# Patient Record
Sex: Male | Born: 1935 | Race: Black or African American | Hispanic: No | Marital: Single | Smoking: Current every day smoker
Health system: Southern US, Community
[De-identification: ages and names within clinical notes are randomized; demographics above are authoritative.]

## PROBLEM LIST (undated history)

## (undated) DIAGNOSIS — I639 Cerebral infarction, unspecified: Secondary | ICD-10-CM

## (undated) DIAGNOSIS — I10 Essential (primary) hypertension: Secondary | ICD-10-CM

## (undated) DIAGNOSIS — M109 Gout, unspecified: Secondary | ICD-10-CM

## (undated) DIAGNOSIS — M1990 Unspecified osteoarthritis, unspecified site: Secondary | ICD-10-CM

## (undated) HISTORY — DX: Unspecified osteoarthritis, unspecified site: M19.90

## (undated) HISTORY — DX: Gout, unspecified: M10.9

---

## 2004-01-27 ENCOUNTER — Other Ambulatory Visit: Payer: Self-pay

## 2004-11-19 ENCOUNTER — Emergency Department: Payer: Self-pay | Admitting: Emergency Medicine

## 2005-10-17 ENCOUNTER — Ambulatory Visit: Payer: Self-pay | Admitting: *Deleted

## 2008-01-18 ENCOUNTER — Emergency Department: Payer: Self-pay | Admitting: Emergency Medicine

## 2008-01-26 ENCOUNTER — Emergency Department: Payer: Self-pay | Admitting: Emergency Medicine

## 2009-11-24 ENCOUNTER — Inpatient Hospital Stay (HOSPITAL_COMMUNITY): Admission: EM | Admit: 2009-11-24 | Discharge: 2009-11-26 | Payer: Self-pay | Admitting: Emergency Medicine

## 2010-09-23 LAB — CBC
HCT: 31.2 % — ABNORMAL LOW (ref 39.0–52.0)
HCT: 31.8 % — ABNORMAL LOW (ref 39.0–52.0)
Hemoglobin: 10.9 g/dL — ABNORMAL LOW (ref 13.0–17.0)
MCHC: 34.7 g/dL (ref 30.0–36.0)
MCHC: 35 g/dL (ref 30.0–36.0)
MCV: 90.1 fL (ref 78.0–100.0)
Platelets: 220 10*3/uL (ref 150–400)
RBC: 3.6 MIL/uL — ABNORMAL LOW (ref 4.22–5.81)
RDW: 15.3 % (ref 11.5–15.5)
WBC: 6 10*3/uL (ref 4.0–10.5)
WBC: 8 10*3/uL (ref 4.0–10.5)

## 2010-09-23 LAB — COMPREHENSIVE METABOLIC PANEL
ALT: 25 U/L (ref 0–53)
ALT: 31 U/L (ref 0–53)
AST: 104 U/L — ABNORMAL HIGH (ref 0–37)
AST: 55 U/L — ABNORMAL HIGH (ref 0–37)
Albumin: 3.2 g/dL — ABNORMAL LOW (ref 3.5–5.2)
Albumin: 3.3 g/dL — ABNORMAL LOW (ref 3.5–5.2)
Albumin: 4.1 g/dL (ref 3.5–5.2)
Alkaline Phosphatase: 62 U/L (ref 39–117)
BUN: 14 mg/dL (ref 6–23)
BUN: 24 mg/dL — ABNORMAL HIGH (ref 6–23)
Calcium: 8.7 mg/dL (ref 8.4–10.5)
Calcium: 8.8 mg/dL (ref 8.4–10.5)
Calcium: 9.8 mg/dL (ref 8.4–10.5)
GFR calc Af Amer: 56 mL/min — ABNORMAL LOW (ref >60)
GFR calc non Af Amer: 46 mL/min — ABNORMAL LOW (ref >60)
GFR calc non Af Amer: 52 mL/min — ABNORMAL LOW (ref >60)
Glucose, Bld: 107 mg/dL — ABNORMAL HIGH (ref 70–99)
Glucose, Bld: 113 mg/dL — ABNORMAL HIGH (ref 70–99)
Potassium: 4.3 mEq/L (ref 3.5–5.1)
Sodium: 133 mEq/L — ABNORMAL LOW (ref 135–145)
Total Bilirubin: 0.3 mg/dL (ref 0.3–1.2)
Total Protein: 6.3 g/dL (ref 6.0–8.3)

## 2010-09-23 LAB — RETICULOCYTES
RBC.: 3.51 MIL/uL — ABNORMAL LOW (ref 4.22–5.81)
Retic Ct Pct: 0.3 % — ABNORMAL LOW (ref 0.4–3.1)

## 2010-09-23 LAB — DIFFERENTIAL
Basophils Absolute: 0 10*3/uL (ref 0.0–0.1)
Basophils Relative: 0 % (ref 0–1)
Basophils Relative: 0 % (ref 0–1)
Eosinophils Absolute: 0 10*3/uL (ref 0.0–0.7)
Eosinophils Absolute: 0.1 10*3/uL (ref 0.0–0.7)
Eosinophils Relative: 1 % (ref 0–5)
Lymphocytes Relative: 29 % (ref 12–46)
Lymphocytes Relative: 41 % (ref 12–46)
Lymphs Abs: 0.8 10*3/uL (ref 0.7–4.0)
Lymphs Abs: 1.9 10*3/uL (ref 0.7–4.0)
Monocytes Relative: 1 % — ABNORMAL LOW (ref 3–12)
Monocytes Relative: 9 % (ref 3–12)
Neutro Abs: 2 10*3/uL (ref 1.7–7.7)
Neutro Abs: 3.6 10*3/uL (ref 1.7–7.7)
Neutrophils Relative %: 44 % (ref 43–77)
Neutrophils Relative %: 60 % (ref 43–77)
Neutrophils Relative %: 88 % — ABNORMAL HIGH (ref 43–77)

## 2010-09-23 LAB — URINALYSIS, ROUTINE W REFLEX MICROSCOPIC
Bilirubin Urine: NEGATIVE
Glucose, UA: 100 mg/dL — AB
Leukocytes, UA: NEGATIVE
Nitrite: NEGATIVE
Specific Gravity, Urine: >1.03 — ABNORMAL HIGH (ref 1.005–1.030)
Urobilinogen, UA: 1 mg/dL (ref 0.0–1.0)
pH: 5 (ref 5.0–8.0)

## 2010-09-23 LAB — CORTISOL-AM, BLOOD: Cortisol - AM: 8.1 ug/dL (ref 4.3–22.4)

## 2010-09-23 LAB — RAPID URINE DRUG SCREEN, HOSP PERFORMED
Barbiturates: NOT DETECTED
Benzodiazepines: NOT DETECTED
Cocaine: NOT DETECTED

## 2010-09-23 LAB — GLUCOSE, CAPILLARY
Glucose-Capillary: 108 mg/dL — ABNORMAL HIGH (ref 70–99)
Glucose-Capillary: 114 mg/dL — ABNORMAL HIGH (ref 70–99)
Glucose-Capillary: 115 mg/dL — ABNORMAL HIGH (ref 70–99)
Glucose-Capillary: 122 mg/dL — ABNORMAL HIGH (ref 70–99)
Glucose-Capillary: 226 mg/dL — ABNORMAL HIGH (ref 70–99)

## 2010-09-23 LAB — VITAMIN B12: Vitamin B-12: 274 pg/mL (ref 211–911)

## 2010-09-23 LAB — LACTIC ACID, PLASMA
Lactic Acid, Venous: 1.3 mmol/L (ref 0.5–2.2)
Lactic Acid, Venous: 5.8 mmol/L — ABNORMAL HIGH (ref 0.5–2.2)

## 2010-09-23 LAB — CARDIAC PANEL(CRET KIN+CKTOT+MB+TROPI)
CK, MB: 7.4 ng/mL (ref 0.3–4.0)
CK, MB: 8.8 ng/mL (ref 0.3–4.0)
CK, MB: 9.5 ng/mL (ref 0.3–4.0)
Relative Index: 2.8 — ABNORMAL HIGH (ref 0.0–2.5)
Relative Index: 3 — ABNORMAL HIGH (ref 0.0–2.5)
Relative Index: 3.7 — ABNORMAL HIGH (ref 0.0–2.5)
Troponin I: 0.32 ng/mL — ABNORMAL HIGH (ref 0.00–0.06)

## 2010-09-23 LAB — CULTURE, BLOOD (ROUTINE X 2)
Culture: NO GROWTH
Report Status: 5262011

## 2010-09-23 LAB — FERRITIN: Ferritin: 146 ng/mL (ref 22–322)

## 2010-09-23 LAB — URINE CULTURE

## 2010-09-23 LAB — T4, FREE: Free T4: 1.06 ng/dL (ref 0.80–1.80)

## 2010-09-23 LAB — TSH: TSH: 0.577 u[IU]/mL (ref 0.350–4.500)

## 2010-09-23 LAB — MAGNESIUM
Magnesium: 1.8 mg/dL (ref 1.5–2.5)
Magnesium: 1.8 mg/dL (ref 1.5–2.5)

## 2010-09-23 LAB — URINE MICROSCOPIC-ADD ON

## 2010-09-23 LAB — IRON AND TIBC
TIBC: 237 ug/dL (ref 215–435)
UIBC: 126 ug/dL

## 2014-01-03 ENCOUNTER — Inpatient Hospital Stay: Payer: Self-pay | Admitting: Internal Medicine

## 2014-01-03 LAB — CBC WITH DIFFERENTIAL/PLATELET
BASOS ABS: 0.1 10*3/uL (ref 0.0–0.1)
BASOS PCT: 1.2 %
EOS ABS: 0.4 10*3/uL (ref 0.0–0.7)
Eosinophil %: 6.1 %
HCT: 45.1 % (ref 40.0–52.0)
HGB: 14.8 g/dL (ref 13.0–18.0)
LYMPHS PCT: 42.4 %
Lymphocyte #: 2.5 10*3/uL (ref 1.0–3.6)
MCH: 28.6 pg (ref 26.0–34.0)
MCHC: 32.8 g/dL (ref 32.0–36.0)
MCV: 87 fL (ref 80–100)
MONOS PCT: 12.6 %
Monocyte #: 0.7 x10 3/mm (ref 0.2–1.0)
NEUTROS ABS: 2.2 10*3/uL (ref 1.4–6.5)
Neutrophil %: 37.7 %
Platelet: 200 10*3/uL (ref 150–440)
RBC: 5.18 10*6/uL (ref 4.40–5.90)
RDW: 15.4 % — ABNORMAL HIGH (ref 11.5–14.5)
WBC: 5.9 10*3/uL (ref 3.8–10.6)

## 2014-01-03 LAB — URINALYSIS, COMPLETE
Bilirubin,UR: NEGATIVE
Blood: NEGATIVE
Glucose,UR: NEGATIVE mg/dL (ref 0–75)
Ketone: NEGATIVE
Leukocyte Esterase: NEGATIVE
Nitrite: NEGATIVE
PH: 5 (ref 4.5–8.0)
Protein: NEGATIVE
SPECIFIC GRAVITY: 1.008 (ref 1.003–1.030)
Squamous Epithelial: NONE SEEN

## 2014-01-03 LAB — BASIC METABOLIC PANEL
ANION GAP: 9 (ref 7–16)
BUN: 30 mg/dL — ABNORMAL HIGH (ref 7–18)
Calcium, Total: 9.8 mg/dL (ref 8.5–10.1)
Chloride: 98 mmol/L (ref 98–107)
Co2: 28 mmol/L (ref 21–32)
Creatinine: 1.2 mg/dL (ref 0.60–1.30)
GFR CALC NON AF AMER: 58 — AB
GLUCOSE: 84 mg/dL (ref 65–99)
OSMOLALITY: 275 (ref 275–301)
Potassium: 4.5 mmol/L (ref 3.5–5.1)
Sodium: 135 mmol/L — ABNORMAL LOW (ref 136–145)

## 2014-01-03 LAB — MAGNESIUM: Magnesium: 2.1 mg/dL

## 2014-01-03 LAB — HEPATIC FUNCTION PANEL A (ARMC)
ALK PHOS: 93 U/L
ALT: 10 U/L — AB (ref 12–78)
Albumin: 4.1 g/dL (ref 3.4–5.0)
Bilirubin, Direct: <0.1 mg/dL (ref 0.00–0.20)
Bilirubin,Total: 0.8 mg/dL (ref 0.2–1.0)
SGOT(AST): 25 U/L (ref 15–37)
Total Protein: 9.2 g/dL — ABNORMAL HIGH (ref 6.4–8.2)

## 2014-01-03 LAB — TROPONIN I

## 2014-01-03 LAB — LIPASE, BLOOD: Lipase: 90 U/L (ref 73–393)

## 2014-01-04 LAB — TSH: THYROID STIMULATING HORM: 0.288 u[IU]/mL — AB

## 2014-01-04 LAB — TROPONIN I

## 2014-01-04 LAB — LIPID PANEL
Cholesterol: 166 mg/dL (ref 0–200)
HDL: 30 mg/dL — AB (ref 40–60)
LDL CHOLESTEROL, CALC: 120 mg/dL — AB (ref 0–100)
Triglycerides: 79 mg/dL (ref 0–200)
VLDL Cholesterol, Calc: 16 mg/dL (ref 5–40)

## 2014-01-04 LAB — CK TOTAL AND CKMB (NOT AT ARMC)
CK, TOTAL: 138 U/L
CK, TOTAL: 143 U/L
CK-MB: 1.8 ng/mL (ref 0.5–3.6)
CK-MB: 1.8 ng/mL (ref 0.5–3.6)

## 2014-10-28 NOTE — H&P (Signed)
PATIENT NAME:  [name], [name] MR#:  [id number] DATE OF BIRTH:  04-Jun-1934  DATE OF ADMISSION:  01/03/2014  PRIMARY CARE PHYSICIAN:  Dr. Darreld Mclean.   CHIEF COMPLAINT:  Left-sided weakness, not feeling well.   HISTORY OF PRESENT ILLNESS:  [name] is a 81 year old male with known history of hypertension, hyperlipidemia who usually is wheelchair bound, had pain in the left arm about a week back.  Today, the patient called his daughter stating that the patient has had difficulty swallowing, could not strike the matches and weakness in the left leg.  Concerning this, the patient's daughter brought him to the Emergency Department.  At the time of my interview the patient's symptoms were resolved.  Work-up in the Emergency Department with a CT head without contrast:  No acute intracranial abnormality.  Chest x-ray did not show any acute abnormality.  The patient also experienced a headache, shortness of breath.   PAST MEDICAL HISTORY: 1.  Hypertension.  2.  Gout. 3.  Osteoarthritis.   PAST SURGICAL HISTORY:  None.   ALLERGIES:  No known drug allergies.   HOME MEDICATIONS:  None.   SOCIAL HISTORY:  Continues to smoke 1 pack a day.  Denies drinking alcohol or using illicit drugs.  Independent of ADLs.   FAMILY HISTORY:  Hypertension, diabetes mellitus, on the male side of the family.  One of the brother's with cancer.   REVIEW OF SYSTEMS: CONSTITUTIONAL:  Generalized weakness.  EYES:  No change in vision.  EARS, NOSE, THROAT:  No change in hearing.  RESPIRATORY:  No cough, shortness of breath.  CARDIOVASCULAR:  No chest pain, palpations.  GASTROINTESTINAL:  No nausea, vomiting, abdominal pain, however experienced difficulty swallowing.  SKIN:  No rash or lesions.  MUSCULOSKELETAL:  Good range of motion in all the extremities.  NEUROLOGIC:  Experienced weakness on the left side of the body.   PHYSICAL EXAMINATION: GENERAL:  This is a well-built, well-nourished, age-appropriate male  lying down in the bed, not in distress.  VITAL SIGNS:  Temperature 97.8, pulse 80, blood pressure 129/76, respiratory rate of 20, oxygen saturation is 94% on room air.  HEENT:  Head normocephalic, atraumatic.  Eyes, no scleral icterus.  Conjunctivae normal.  Pupils equal and react to light.  Extraocular movements are intact.  Mucous membranes moist.  No pharyngeal erythema.  NECK:  Supple.  No lymphadenopathy.  No JVD.  No carotid bruit.  CHEST:  Has no focal tenderness.  LUNGS:  Bilaterally clear to auscultation.  HEART:  S1, S2 regular.  No murmurs are heard.  ABDOMEN:  Bowel sounds plus.  Soft, nontender, nondistended.  EXTREMITIES:  No pedal edema.  Pulses 2+.  SKIN:  No rash or lesions.  MUSCULOSKELETAL:  Good range of motion in all the extremities.   NEUROLOGIC:  The patient is alert, oriented to place, person, and time.  Cranial nerves II through XII intact.  Motor 5 by 5 in upper and lower extremities.   LABORATORY DATA:  Magnesium 2.1.  CT head without contrast:  No acute intracranial abnormality.   CBC and CMP are completely within normal limits.   ASSESSMENT AND PLAN:  [name], a 81 year old male who comes with left arm pain, numbness, difficulty swallowing.  1.  Transient ischemic attack.  We will obtain MRI of the brain, carotid Dopplers and echocardiogram.  We will obtain physical therapy, occupational therapy.  2.  Hypertension:  Currently well controlled.  Continue with the home medications.  3.  Keep the patient on  deep vein thrombosis prophylaxis with Lovenox.   TIME SPENT:  45 minutes.    ____________________________ Susa GriffinsPadmaja Avianna Moynahan, MD pv:ea D: 01/04/2014 00:54:39 ET T: 01/04/2014 01:56:38 ET JOB#: [id number]  cc: Susa GriffinsPadmaja Ritesh Opara, MD, <Dictator> Susa GriffinsPADMAJA Shahad Mazurek MD ELECTRONICALLY SIGNED 01/12/2014 0:26

## 2014-10-28 NOTE — Discharge Summary (Signed)
PATIENT NAME:  [name], [name] MR#:  [id number] DATE OF BIRTH:  May 14, 1934  DATE OF ADMISSION:  01/03/2014 DATE OF DISCHARGE:  01/04/2014  Please note, the patient left against medical advice.   DISCHARGE DIAGNOSIS: Acute cerebrovascular accident.   SECONDARY DIAGNOSES: 1.  Hypertension.  2.  Gout.  3.  Osteoarthritis.    CONSULTATIONS: None.   PROCEDURES AND RADIOLOGY: Bilateral carotid Dopplers on the 1st of July showed moderate plaque at both carotid bifurcations. Bilateral ICA stenosis of less than 50%. MRI of the brain without contrast on 1st of July showed several small foci of acute infarction within the right hemispheric white matter, the largest in the frontal lobe measuring 5 mm. No large acute infarction. Chronic small vessel ischemic changes throughout the brain. This infarction could be possibly due to embolic disease, although there is no cortical involvement. It could represent minor watershed infarction as well.   Chest x-ray on the 30th of June showed no acute cardiopulmonary disease. Aortic tortuosity present.   CT scan of the head without contrast on the 30th of June showed no acute intracranial process. Moderate chronic microvascular ischemic disease. Chronic right mastoid effusion.   MAJOR LABORATORY PANEL: Urinalysis on admission was negative.   HISTORY AND SHORT HOSPITAL COURSE: The patient is a 81 year old male with above-mentioned medical problems, who was admitted for left-sided weakness and was found to have acute cerebrovascular accident, confirmed by MRI. The patient would not stay in the hospital for further work-up and/or neurology consultation, and wanted to go home. In spite of having a long conversation of the risks and benefits of staying in the hospital with the patient and daughter, the patient decided to leave the hospital. He understood all the risks, including another major stroke and/or possibly paralysis and death, he was willing to take all the risks  and wanted to go home irrespective of death. I did reinforce him to take aspirin and statin, and have given him prescription for same. I have also requested him to [facility] as an outpatient as soon as possible, hopefully by early next week at the latest.   On the date of discharge, his vital signs are as follows: Temperature 98.2, heart rte 90 per minute, respirations 18 per minute, blood pressure 130/77 mmHg. He was saturating 95% on room air.  PERTINENT PHYSICAL EXAMINATION ON THE DATE OF DISCHARGE: CARDIOVASCULAR: S1, S2 normal. No murmurs, rubs or gallop.  LUNGS: Clear to auscultation bilaterally. No wheezing, rales, rhonchi, or crepitation.  NEUROLOGIC: The patient had a nonfocal examination.  All other physical examination remained at baseline.   MEDICATIONS AT HOME: Was strongly recommended to have full-dose aspirin and statin, lovastatin 20 mg p.o. daily. [facility], where he was agreeable to pick up.  TOTAL TIME DISCHARGING THIS PATIENT: 40 minutes.    ____________________________ Ellamae SiaVipul S. Sherryll BurgerShah, MD vss:cg D: 01/06/2014 22:37:05 ET T: 01/07/2014 05:42:12 ET JOB#: [id number]  cc: Breylon Sherrow S. Sherryll BurgerShah, MD, <Dictator> Leanna SatoLinda M. Miles, MD [facility] Neurology Ellamae SiaVIPUL [address] [facility] MD ELECTRONICALLY SIGNED 01/08/2014 18:39

## 2015-04-09 ENCOUNTER — Ambulatory Visit: Payer: Medicare Other | Attending: Family Medicine | Admitting: Physical Therapy

## 2015-08-02 ENCOUNTER — Other Ambulatory Visit: Payer: Self-pay | Admitting: Family Medicine

## 2015-08-02 DIAGNOSIS — N5089 Other specified disorders of the male genital organs: Secondary | ICD-10-CM

## 2015-08-06 ENCOUNTER — Ambulatory Visit
Admission: RE | Admit: 2015-08-06 | Discharge: 2015-08-06 | Disposition: A | Discharge status: Home / Self Care | Payer: Medicare HMO | Source: Ambulatory Visit | Attending: Family Medicine | Admitting: Family Medicine

## 2015-08-06 DIAGNOSIS — N5089 Other specified disorders of the male genital organs: Secondary | ICD-10-CM | POA: Insufficient documentation

## 2015-08-06 DIAGNOSIS — N433 Hydrocele, unspecified: Secondary | ICD-10-CM | POA: Diagnosis not present

## 2015-09-11 ENCOUNTER — Ambulatory Visit
Admission: RE | Admit: 2015-09-11 | Discharge: 2015-09-11 | Disposition: A | Discharge status: Home / Self Care | Payer: Medicare HMO | Source: Ambulatory Visit | Attending: Surgery | Admitting: Surgery

## 2015-09-11 ENCOUNTER — Encounter
Admission: RE | Admit: 2015-09-11 | Discharge: 2015-09-11 | Disposition: A | Discharge status: Home / Self Care | Payer: Medicare HMO | Source: Ambulatory Visit | Attending: Surgery | Admitting: Surgery

## 2015-09-11 DIAGNOSIS — K4090 Unilateral inguinal hernia, without obstruction or gangrene, not specified as recurrent: Secondary | ICD-10-CM | POA: Diagnosis not present

## 2015-09-11 DIAGNOSIS — R918 Other nonspecific abnormal finding of lung field: Secondary | ICD-10-CM | POA: Insufficient documentation

## 2015-09-11 DIAGNOSIS — J449 Chronic obstructive pulmonary disease, unspecified: Secondary | ICD-10-CM | POA: Insufficient documentation

## 2015-09-11 DIAGNOSIS — F17200 Nicotine dependence, unspecified, uncomplicated: Secondary | ICD-10-CM

## 2015-09-11 HISTORY — DX: Cerebral infarction, unspecified: I63.9

## 2015-09-11 HISTORY — DX: Essential (primary) hypertension: I10

## 2015-09-11 LAB — DIFFERENTIAL
BASOS ABS: 0 10*3/uL (ref 0–0.1)
BASOS PCT: 1 %
Eosinophils Absolute: 0.6 10*3/uL (ref 0–0.7)
Eosinophils Relative: 10 %
LYMPHS ABS: 1.6 10*3/uL (ref 1.0–3.6)
LYMPHS PCT: 29 %
MONOS PCT: 11 %
Monocytes Absolute: 0.6 10*3/uL (ref 0.2–1.0)
NEUTROS ABS: 2.7 10*3/uL (ref 1.4–6.5)
Neutrophils Relative %: 49 %

## 2015-09-11 LAB — COMPREHENSIVE METABOLIC PANEL
ALBUMIN: 3.5 g/dL (ref 3.5–5.0)
ALK PHOS: 79 U/L (ref 38–126)
ALT: 6 U/L — ABNORMAL LOW (ref 17–63)
ANION GAP: 7 (ref 5–15)
AST: 15 U/L (ref 15–41)
BUN: 25 mg/dL — ABNORMAL HIGH (ref 6–20)
CALCIUM: 9.2 mg/dL (ref 8.9–10.3)
CO2: 29 mmol/L (ref 22–32)
Chloride: 107 mmol/L (ref 101–111)
Creatinine, Ser: 0.96 mg/dL (ref 0.61–1.24)
GFR calc Af Amer: >60 mL/min (ref >60)
GFR calc non Af Amer: >60 mL/min (ref >60)
GLUCOSE: 86 mg/dL (ref 65–99)
Potassium: 3.8 mmol/L (ref 3.5–5.1)
SODIUM: 143 mmol/L (ref 135–145)
Total Bilirubin: 0.7 mg/dL (ref 0.3–1.2)
Total Protein: 7.8 g/dL (ref 6.5–8.1)

## 2015-09-11 LAB — CBC
HCT: 36.6 % — ABNORMAL LOW (ref 40.0–52.0)
Hemoglobin: 12.2 g/dL — ABNORMAL LOW (ref 13.0–18.0)
MCH: 28.7 pg (ref 26.0–34.0)
MCHC: 33.5 g/dL (ref 32.0–36.0)
MCV: 85.7 fL (ref 80.0–100.0)
Platelets: 225 10*3/uL (ref 150–440)
RBC: 4.27 MIL/uL — ABNORMAL LOW (ref 4.40–5.90)
RDW: 15.4 % — ABNORMAL HIGH (ref 11.5–14.5)
WBC: 5.5 10*3/uL (ref 3.8–10.6)

## 2015-09-11 LAB — PROTIME-INR
INR: 1.03
Prothrombin Time: 13.7 seconds (ref 11.4–15.0)

## 2015-09-11 NOTE — Pre-Procedure Instructions (Signed)
CXR CALLED AND FAXED TO [facility] AT [facility]

## 2015-09-11 NOTE — Patient Instructions (Signed)
  Your procedure is scheduled on: Friday 09/21/15 Report to Day Surgery. 2ND FLOOR MEDICAL MALL To find out your arrival time please call [phone]) [phone] between 1PM - 3PM on Thursday 09/20/15.  Remember: Instructions that are not followed completely may result in serious medical risk, up to and including death, or upon the discretion of your surgeon and anesthesiologist your surgery may need to be rescheduled.    __X__ 1. Do not eat food or drink liquids after midnight. No gum chewing or hard candies.     __X__ 2. No Alcohol for 24 hours before or after surgery.   ____ 3. Bring all medications with you on the day of surgery if instructed.    __X__ 4. Notify your doctor if there is any change in your medical condition     (cold, fever, infections).     Do not wear jewelry, make-up, hairpins, clips or nail polish.  Do not wear lotions, powders, or perfumes.   Do not shave 48 hours prior to surgery. Men may shave face and neck.  Do not bring valuables to the hospital.    [facility] belongings or valuables.               Contacts, dentures or bridgework may not be worn into surgery.  Leave your suitcase in the car. After surgery it may be brought to your room.  For patients admitted to the hospital, discharge time is determined by your                treatment team.   Patients discharged the day of surgery will not be allowed to drive home.   Please read over the following fact sheets that you were given:   Surgical Site Infection Prevention   ____ Take these medicines the morning of surgery with A SIP OF WATER:    1. AMLODIPINE   2. LOVASTATIN NIGHT BEFORE SURGERY  3.   4.  5.  6.  ____ Fleet Enema (as directed)   __X__ Use CHG Soap as directed  ____ Use inhalers on the day of surgery  ____ Stop metformin 2 days prior to surgery    ____ Take 1/2 of usual insulin dose the night before surgery and none on the morning of surgery.   ____ Stop  Coumadin/Plavix/aspirin on   ____ Stop Anti-inflammatories on    ____ Stop supplements until after surgery.    ____ Bring C-Pap to the hospital.

## 2015-09-21 ENCOUNTER — Encounter
Admission: RE | Disposition: A | Discharge status: Home / Self Care | Payer: Self-pay | Source: Ambulatory Visit | Attending: Surgery

## 2015-09-21 ENCOUNTER — Ambulatory Visit: Payer: Medicare HMO | Admitting: Certified Registered"

## 2015-09-21 ENCOUNTER — Ambulatory Visit
Admission: RE | Admit: 2015-09-21 | Discharge: 2015-09-21 | Disposition: A | Discharge status: Home / Self Care | Payer: Medicare HMO | Source: Ambulatory Visit | Attending: Surgery | Admitting: Surgery

## 2015-09-21 ENCOUNTER — Encounter: Payer: Self-pay | Admitting: *Deleted

## 2015-09-21 DIAGNOSIS — F17200 Nicotine dependence, unspecified, uncomplicated: Secondary | ICD-10-CM | POA: Diagnosis not present

## 2015-09-21 DIAGNOSIS — M109 Gout, unspecified: Secondary | ICD-10-CM | POA: Diagnosis not present

## 2015-09-21 DIAGNOSIS — I69354 Hemiplegia and hemiparesis following cerebral infarction affecting left non-dominant side: Secondary | ICD-10-CM | POA: Insufficient documentation

## 2015-09-21 DIAGNOSIS — K4090 Unilateral inguinal hernia, without obstruction or gangrene, not specified as recurrent: Secondary | ICD-10-CM | POA: Insufficient documentation

## 2015-09-21 DIAGNOSIS — I10 Essential (primary) hypertension: Secondary | ICD-10-CM | POA: Insufficient documentation

## 2015-09-21 DIAGNOSIS — Z79899 Other long term (current) drug therapy: Secondary | ICD-10-CM | POA: Diagnosis not present

## 2015-09-21 DIAGNOSIS — Z7982 Long term (current) use of aspirin: Secondary | ICD-10-CM | POA: Diagnosis not present

## 2015-09-21 HISTORY — PX: INGUINAL HERNIA REPAIR: SHX194

## 2015-09-21 SURGERY — REPAIR, HERNIA, INGUINAL, ADULT
Anesthesia: General | Site: Inguinal | Laterality: Right | Wound class: Clean

## 2015-09-21 MED ORDER — FAMOTIDINE 20 MG PO TABS
20.0000 mg | ORAL_TABLET | Freq: Once | ORAL | Status: AC
  Administered 2015-09-21: 20 mg via ORAL

## 2015-09-21 MED ORDER — ONDANSETRON HCL 4 MG/2ML IJ SOLN
INTRAMUSCULAR | Status: DC | PRN
  Administered 2015-09-21: 4 mg via INTRAVENOUS

## 2015-09-21 MED ORDER — FENTANYL CITRATE (PF) 100 MCG/2ML IJ SOLN: 25.0000 ug | INTRAMUSCULAR | Status: DC | PRN

## 2015-09-21 MED ORDER — LACTATED RINGERS IV SOLN
INTRAVENOUS | Status: DC
  Administered 2015-09-21 (×2): via INTRAVENOUS

## 2015-09-21 MED ORDER — FAMOTIDINE 20 MG PO TABS
ORAL_TABLET | ORAL | Status: AC
  Filled 2015-09-21: qty 1

## 2015-09-21 MED ORDER — NEOSTIGMINE METHYLSULFATE 10 MG/10ML IV SOLN
INTRAVENOUS | Status: DC | PRN
  Administered 2015-09-21: 4 mg via INTRAVENOUS

## 2015-09-21 MED ORDER — CEFAZOLIN SODIUM-DEXTROSE 2-3 GM-% IV SOLR
2.0000 g | Freq: Once | INTRAVENOUS | Status: AC
  Administered 2015-09-21: 2 g via INTRAVENOUS

## 2015-09-21 MED ORDER — PROPOFOL 10 MG/ML IV BOLUS
INTRAVENOUS | Status: DC | PRN
  Administered 2015-09-21: 100 mg via INTRAVENOUS

## 2015-09-21 MED ORDER — HYDROCODONE-ACETAMINOPHEN 5-325 MG PO TABS: 1.0000 | ORAL_TABLET | ORAL | Status: DC | PRN

## 2015-09-21 MED ORDER — CEFAZOLIN SODIUM-DEXTROSE 2-3 GM-% IV SOLR
INTRAVENOUS | Status: AC
  Filled 2015-09-21: qty 50

## 2015-09-21 MED ORDER — ROCURONIUM BROMIDE 100 MG/10ML IV SOLN
INTRAVENOUS | Status: DC | PRN
  Administered 2015-09-21: 40 mg via INTRAVENOUS

## 2015-09-21 MED ORDER — LIDOCAINE HCL (CARDIAC) 20 MG/ML IV SOLN
INTRAVENOUS | Status: DC | PRN
  Administered 2015-09-21: 80 mg via INTRAVENOUS

## 2015-09-21 MED ORDER — ONDANSETRON HCL 4 MG/2ML IJ SOLN: 4.0000 mg | Freq: Once | INTRAMUSCULAR | Status: DC | PRN

## 2015-09-21 MED ORDER — GLYCOPYRROLATE 0.2 MG/ML IJ SOLN
INTRAMUSCULAR | Status: DC | PRN
  Administered 2015-09-21: 0.6 mg via INTRAVENOUS

## 2015-09-21 MED ORDER — BUPIVACAINE-EPINEPHRINE (PF) 0.5% -1:200000 IJ SOLN
INTRAMUSCULAR | Status: AC
  Filled 2015-09-21: qty 30

## 2015-09-21 MED ORDER — PHENYLEPHRINE HCL 10 MG/ML IJ SOLN
INTRAMUSCULAR | Status: DC | PRN
  Administered 2015-09-21 (×2): 50 ug via INTRAVENOUS
  Administered 2015-09-21: 100 ug via INTRAVENOUS
  Administered 2015-09-21: 50 ug via INTRAVENOUS
  Administered 2015-09-21: 100 ug via INTRAVENOUS
  Administered 2015-09-21 (×2): 50 ug via INTRAVENOUS

## 2015-09-21 MED ORDER — FENTANYL CITRATE (PF) 100 MCG/2ML IJ SOLN
INTRAMUSCULAR | Status: DC | PRN
  Administered 2015-09-21 (×2): 25 ug via INTRAVENOUS
  Administered 2015-09-21: 50 ug via INTRAVENOUS

## 2015-09-21 MED ORDER — BUPIVACAINE-EPINEPHRINE (PF) 0.5% -1:200000 IJ SOLN
INTRAMUSCULAR | Status: DC | PRN
  Administered 2015-09-21: 15 mL

## 2015-09-21 SURGICAL SUPPLY — 28 items
BLADE SURG 15 STRL LF DISP TIS (BLADE) ×1 IMPLANT
BLADE SURG 15 STRL SS (BLADE) ×2
CANISTER SUCT 1200ML W/VALVE (MISCELLANEOUS) ×3 IMPLANT
CHLORAPREP W/TINT 26ML (MISCELLANEOUS) ×6 IMPLANT
DECANTER SPIKE VIAL GLASS SM (MISCELLANEOUS) ×3 IMPLANT
DRAIN PENROSE 5/8X18 LTX STRL (WOUND CARE) ×3 IMPLANT
DRAPE LAPAROTOMY 77X122 PED (DRAPES) ×3 IMPLANT
ELECT REM PT RETURN 9FT ADLT (ELECTROSURGICAL) ×3
ELECTRODE REM PT RTRN 9FT ADLT (ELECTROSURGICAL) ×1 IMPLANT
GLOVE BIO SURGEON STRL SZ7.5 (GLOVE) ×12 IMPLANT
GLOVE BIOGEL PI IND STRL 7.5 (GLOVE) ×3 IMPLANT
GLOVE BIOGEL PI INDICATOR 7.5 (GLOVE) ×6
GOWN STRL REUS W/ TWL LRG LVL3 (GOWN DISPOSABLE) ×4 IMPLANT
GOWN STRL REUS W/TWL LRG LVL3 (GOWN DISPOSABLE) ×8
KIT RM TURNOVER STRD PROC AR (KITS) ×3 IMPLANT
LABEL OR SOLS (LABEL) ×3 IMPLANT
LIQUID BAND (GAUZE/BANDAGES/DRESSINGS) ×3 IMPLANT
MESH SYNTHETIC 4X6 SOFT BARD (Mesh General) ×1 IMPLANT
MESH SYNTHETIC SOFT BARD 4X6 (Mesh General) ×2 IMPLANT
NEEDLE HYPO 25X1 1.5 SAFETY (NEEDLE) ×3 IMPLANT
NS IRRIG 500ML POUR BTL (IV SOLUTION) IMPLANT
PACK BASIN MINOR ARMC (MISCELLANEOUS) ×3 IMPLANT
SUT CHROMIC 4 0 RB 1X27 (SUTURE) ×3 IMPLANT
SUT MNCRL AB 4-0 PS2 18 (SUTURE) ×3 IMPLANT
SUT SURGILON 0 30 BLK (SUTURE) ×6 IMPLANT
SUT VIC AB 4-0 SH 27 (SUTURE) ×2
SUT VIC AB 4-0 SH 27XANBCTRL (SUTURE) ×1 IMPLANT
SYRINGE 10CC LL (SYRINGE) ×3 IMPLANT

## 2015-09-21 NOTE — Anesthesia Procedure Notes (Signed)
Procedure Name: Intubation Performed by: Casey BurkittHOANG, Donnelle Rubey Pre-anesthesia Checklist: Patient identified, Emergency Drugs available, Suction available, Patient being monitored and Timeout performed Patient Re-evaluated:Patient Re-evaluated prior to inductionOxygen Delivery Method: Circle system utilized Preoxygenation: Pre-oxygenation with 100% oxygen Intubation Type: IV induction Ventilation: Mask ventilation without difficulty Laryngoscope Size: Mac and 3 Grade View: Grade I Tube type: Oral Tube size: 7.5 mm Number of attempts: 1 Airway Equipment and Method: Stylet Placement Confirmation: ETT inserted through vocal cords under direct vision,  positive ETCO2 and breath sounds checked- equal and bilateral Secured at: 24 cm Tube secured with: Tape Dental Injury: Teeth and Oropharynx as per pre-operative assessment

## 2015-09-21 NOTE — Transfer of Care (Signed)
Immediate Anesthesia Transfer of Care Note  Patient: [name] Harl Sr.  Procedure(s) Performed: Procedure(s): HERNIA REPAIR INGUINAL ADULT (Right)  Patient Location: PACU  Anesthesia Type:General  Level of Consciousness: responds to stimulation  Airway & Oxygen Therapy: Patient Spontanous Breathing and Patient connected to face mask oxygen  Post-op Assessment: Report given to RN and Post -op Vital signs reviewed and stable  Post vital signs: Reviewed and stable  Last Vitals:  Filed Vitals:   09/21/15 1021 09/21/15 1334  BP: 173/96 133/97  Pulse: 71 68  Temp: 36.7 C 36.3 C  Resp: 16 14    Complications: No apparent anesthesia complications

## 2015-09-21 NOTE — H&P (Signed)
  He reports no change in condition since office visit.  Moderate discomfort at hernia site.  On exam right inguinal hernia site marked YES.  Discussed plan for surgery.

## 2015-09-21 NOTE — OR Nursing (Signed)
Patient in room 2 at point of discharge; patients daughter was present for discharge instructions and was given the prescription for hydrocodone with direction for when to use norco vs plain tylenol.per dr smith's instruction; during post op procedures the patients caregiver came into room and assisted this RN dressing the patient for discharge. Caregiver proceeded to whisper in the patients ear stating "she is trying to replace me" patients response was "you are my baby, and she dont know anything about me".  Caregiver states aloud to this RN that the prescription will "never be given to the patient and she will say it got lost".  I informed the caregiver that as of this present moment she is here and technically next of kin.  Caregiver then states she is going to tell social services about the situation today.  I encouraged her to do so if she thought necessary.

## 2015-09-21 NOTE — Op Note (Signed)
OPERATIVE REPORT  PREOPERATIVE DIAGNOSIS: right inguinal hernia  POSTOPERATIVE DIAGNOSIS:right  inguinal hernia  PROCEDURE:  right inguinal hernia repair  ANESTHESIA:  General  SURGEON:  Renda RollsWilton Aleida Crandell M.D.  INDICATIONS: He had a large right inguinal scrotal hernia. It took about 3 minutes to digitally reduce it in the office. He appeared to be in danger of incarceration and repair was recommended for definitive treatment  With the patient on the operating table in the supine position the right lower quadrant was prepared with clippers and with ChloraPrep and draped in a sterile manner. A transversely oriented suprapubic incision was made and carried down through subcutaneous tissues. Electrocautery was used for hemostasis. The Scarpa's fascia was incised. The external oblique aponeurosis was incised along the course of its fibers to open the external ring and expose the inguinal cord structures. The cord structures were mobilized. A Penrose drain was passed around the cord structures for traction. Cremaster fibers were separated to expose an indirect hernia sac which was dissected free from surrounding structures. The sac was some 6 inches in length. The sac was opened. Its continuity with the peritoneal cavity was demonstrated. A high ligation of the sac was done with a 0 Surgilon suture ligature. The sac was excised and was not sent for pathology. The stump was allowed to retract. The repair was carried out with 0 Surgilon sutures. The conjoined tendon was sutured to the shelving edge of the inguinal ligament incorporating transversalis fascia into the repair. The last stitch led to satisfactory narrowing of the internal ring. Bard soft mesh was cut to create an oval shape and was placed over the repair. This was sutured to the repair with interrupted 0 Surgilon sutures and also sutured medially to the deep fascia and on both sides of the internal ring. Next after seeing hemostasis was intact the cord  structures were replaced along the floor of the inguinal canal. The cut edges of the external oblique aponeurosis were closed with a running 4-0 Vicryl suture to re-create the external ring. The deep fascia superior and lateral to the repair site was infiltrated with half percent Sensorcaine with epinephrine. Subcutaneous tissues were also infiltrated. The Scarpa's fascia was closed with interrupted 4-0 Vicryl sutures. The skin was closed with running 4-0 Monocryl subcuticular suture and LiquiBand. The testicle remained in the scrotum  The patient appeared to be in satisfactory condition and was prepared for transfer to the recovery room.  Renda RollsWilton Dalaina Tates M.D.

## 2015-09-21 NOTE — Progress Notes (Addendum)
Pt. To pre op via wheelchair with caregiver.  Patient made X for his signature and wanted Ms. Williams sign his consent for him.  Patient had A stroke in the past and has difficulty with grip in right hand.  Pt. Also very hard of hearing. Patient is alert and oriented x4.  Patient states he has someone to stay with him tonight and Mr. Mayford KnifeWilliams advised she will be taking him home, assisting him with eating and settling him in.   Ms Mayford KnifeWilliams advised she will notify His APS person.

## 2015-09-21 NOTE — Anesthesia Preprocedure Evaluation (Addendum)
Anesthesia Evaluation  Patient identified by MRN, date of birth, ID band Patient awake    Reviewed: Allergy & Precautions, NPO status , Patient's Chart, lab work & pertinent test results  Airway Mallampati: II  TM Distance: >3 FB Neck ROM: Full    Dental  (+) Missing, Poor Dentition Only 2 teeth left and in poor condition:   Pulmonary Current Smoker,    Pulmonary exam normal breath sounds clear to auscultation       Cardiovascular hypertension, Pt. on medications Normal cardiovascular exam     Neuro/Psych L sided weakness CVA, Residual Symptoms negative psych ROS   GI/Hepatic Neg liver ROS,   Endo/Other  negative endocrine ROS  Renal/GU negative Renal ROS  negative genitourinary   Musculoskeletal negative musculoskeletal ROS (+)   Abdominal Normal abdominal exam  (+)   Peds negative pediatric ROS (+)  Hematology negative hematology ROS (+)   Anesthesia Other Findings   Reproductive/Obstetrics                            Anesthesia Physical Anesthesia Plan  ASA: III  Anesthesia Plan: General   Post-op Pain Management:    Induction: Intravenous and Rapid sequence  Airway Management Planned: Oral ETT  Additional Equipment:   Intra-op Plan:   Post-operative Plan: Extubation in OR  Informed Consent: I have reviewed the patients History and Physical, chart, labs and discussed the procedure including the risks, benefits and alternatives for the proposed anesthesia with the patient or authorized representative who has indicated his/her understanding and acceptance.   Dental advisory given  Plan Discussed with: CRNA and Surgeon  Anesthesia Plan Comments:         Anesthesia Quick Evaluation

## 2015-09-21 NOTE — Anesthesia Postprocedure Evaluation (Signed)
Anesthesia Post Note  Patient: [name] Tabron Sr.  Procedure(s) Performed: Procedure(s) (LRB): HERNIA REPAIR INGUINAL ADULT (Right)  Patient location during evaluation: PACU Anesthesia Type: General Level of consciousness: awake Pain management: satisfactory to patient Vital Signs Assessment: post-procedure vital signs reviewed and stable Respiratory status: spontaneous breathing Cardiovascular status: blood pressure returned to baseline Postop Assessment: no backache Anesthetic complications: no    Last Vitals:  Filed Vitals:   09/21/15 1334 09/21/15 1340  BP: 133/97   Pulse: 68 69  Temp: 36.3 C   Resp: 14 14    Last Pain:  Filed Vitals:   09/21/15 1340  PainSc: Asleep                 VAN STAVEREN,Estephanie Hubbs

## 2015-09-21 NOTE — Discharge Instructions (Signed)
Take Tylenol or Norco if needed for pain.  May resume aspirin on Sunday.  Avoid straining and heavy lifting.  The glue covering the wound will likely come off within 1 or 2 weeks.  General Anesthesia, Adult General anesthesia is a sleep-like state of non-feeling produced by medicines (anesthetics). General anesthesia prevents you from being alert and feeling pain during a medical procedure. Your caregiver may recommend general anesthesia if your procedure:  Is long.  Is painful or uncomfortable.  Would be frightening to see or hear.  Requires you to be still.  Affects your breathing.  Causes significant blood loss. LET YOUR CAREGIVER KNOW ABOUT:  Allergies to food or medicine.  Medicines taken, including vitamins, herbs, eyedrops, over-the-counter medicines, and creams.  Use of steroids (by mouth or creams).  Previous problems with anesthetics or numbing medicines, including problems experienced by relatives.  History of bleeding problems or blood clots.  Previous surgeries and types of anesthetics received.  Possibility of pregnancy, if this applies.  Use of cigarettes, alcohol, or illegal drugs.  Any health condition(s), especially diabetes, sleep apnea, and high blood pressure. RISKS AND COMPLICATIONS General anesthesia rarely causes complications. However, if complications do occur, they can be life threatening. Complications include:  A lung infection.  A stroke.  A heart attack.  Waking up during the procedure. When this occurs, the patient may be unable to move and communicate that he or she is awake. The patient may feel severe pain. Older adults and adults with serious medical problems are more likely to have complications than adults who are young and healthy. Some complications can be prevented by answering all of your caregiver's questions thoroughly and by following all pre-procedure instructions. It is important to tell your caregiver if any of the  pre-procedure instructions, especially those related to diet, were not followed. Any food or liquid in the stomach can cause problems when you are under general anesthesia. BEFORE THE PROCEDURE  Ask your caregiver if you will have to spend the night at the hospital. If you will not have to spend the night, arrange to have an adult drive you and stay with you for 24 hours.  Follow your caregiver's instructions if you are taking dietary supplements or medicines. Your caregiver may tell you to stop taking them or to reduce your dosage.  Do not smoke for as long as possible before your procedure. If possible, stop smoking 3-6 weeks before the procedure.  Do not take new dietary supplements or medicines within 1 week of your procedure unless your caregiver approves them.  Do not eat within 8 hours of your procedure or as directed by your caregiver. Drink only clear liquids, such as water, black coffee (without milk or cream), and fruit juices (without pulp).  Do not drink within 3 hours of your procedure or as directed by your caregiver.  You may brush your teeth on the morning of the procedure, but make sure to spit out the toothpaste and water when finished. PROCEDURE  You will receive anesthetics through a mask, through an intravenous (IV) access tube, or through both. A doctor who specializes in anesthesia (anesthesiologist) or a nurse who specializes in anesthesia (nurse anesthetist) or both will stay with you throughout the procedure to make sure you remain unconscious. He or she will also watch your blood pressure, pulse, and oxygen levels to make sure that the anesthetics do not cause any problems. Once you are asleep, a breathing tube or mask may be used to help  you breathe. AFTER THE PROCEDURE You will wake up after the procedure is complete. You may be in the room where the procedure was performed or in a recovery area. You may have a sore throat if a breathing tube was used. You may also  feel:  Dizzy.  Weak.  Drowsy.  Confused.  Nauseous.  Cold. These are all normal responses and can be expected to last for up to 24 hours after the procedure is complete. A caregiver will tell you when you are ready to go home. This will usually be when you are fully awake and in stable condition.   This information is not intended to replace advice given to you by your health care provider. Make sure you discuss any questions you have with your health care provider.   Document Released: 09/30/2007 Document Revised: 07/14/2014 Document Reviewed: 10/22/2011 Elsevier Interactive Patient Education [employer].

## 2015-09-24 ENCOUNTER — Encounter: Payer: Self-pay | Admitting: Surgery

## 2016-01-18 ENCOUNTER — Ambulatory Visit
Admission: RE | Admit: 2016-01-18 | Discharge: 2016-01-18 | Disposition: A | Discharge status: Home / Self Care | Payer: Medicare HMO | Source: Ambulatory Visit | Attending: Family Medicine | Admitting: Family Medicine

## 2016-01-18 ENCOUNTER — Other Ambulatory Visit: Payer: Self-pay | Admitting: Family Medicine

## 2016-01-18 DIAGNOSIS — J449 Chronic obstructive pulmonary disease, unspecified: Secondary | ICD-10-CM | POA: Insufficient documentation

## 2016-01-18 DIAGNOSIS — Z87898 Personal history of other specified conditions: Secondary | ICD-10-CM | POA: Insufficient documentation

## 2016-01-18 DIAGNOSIS — I700 Atherosclerosis of aorta: Secondary | ICD-10-CM | POA: Diagnosis not present

## 2016-01-18 DIAGNOSIS — R7611 Nonspecific reaction to tuberculin skin test without active tuberculosis: Secondary | ICD-10-CM

## 2016-02-11 NOTE — Progress Notes (Signed)
02/12/2016 10:47 AM   [name] Seabrooks Sr. 1934-05-27 [id number]  Referring provider: [name], MD [address] [place], [place] [address]  Chief Complaint  Patient presents with  . Testicle Pain    referred by     HPI: Patient is an 82 year old African American male who is referred by his PCP, Dr. Marvis Moeller, for scrotal discomfort.  Upon further questioning, the patient is actually complaining of erectile dysfunction.  He states since he had his hernia surgery he is having issues with erections.  He states prior to the surgery he was always "ready for action."  He states he is not longer having spontaneous erections.  He tried one Viagra, but it was ineffective.  He does not have pain or curvature with his erections.  His IPSS score today is 10, which is moderate lower urinary tract symptomatology. He is mixed with his quality life due to his urinary symptoms. His PVR is 138 mL.  His major complaint today was nocturia and leakage of urine .  He has had these symptoms for several years.  He denies any dysuria, hematuria or suprapubic pain.   He also denies any recent fevers, chills, nausea or vomiting.  He does not have a family history of PCa.      IPSS    Row Name 02/12/16 1000         International Prostate Symptom Score   How often have you had the sensation of not emptying your bladder? Not at All     How often have you had to urinate less than every two hours? Less than half the time     How often have you found you stopped and started again several times when you urinated? Not at All     How often have you found it difficult to postpone urination? Almost always     How often have you had a weak urinary stream? Not at All     How often have you had to strain to start urination? Not at All     How many times did you typically get up at night to urinate? 3 Times     Total IPSS Score 10       Quality of Life due to urinary symptoms   If you were to spend the rest of  your life with your urinary condition just the way it is now how would you feel about that? Mixed        Score:  1-7 Mild 8-19 Moderate 20-35 Severe     PMH: Past Medical History:  Diagnosis Date  . Arthritis   . Gout   . Hypertension   . Stroke [facility])    left sided weakness    Surgical History: Past Surgical History:  Procedure Laterality Date  . INGUINAL HERNIA REPAIR Right 09/21/2015   Procedure: HERNIA REPAIR INGUINAL ADULT;  Surgeon: [name], MD;  Location: [facility] ORS;  Service: General;  Laterality: Right;    Home Medications:    Medication List       Accurate as of 02/12/16 10:47 AM. Always use your most recent med list.          allopurinol 100 MG tablet Commonly known as:  ZYLOPRIM Take 100 mg by mouth daily.   amLODipine 5 MG tablet Commonly known as:  NORVASC Take 5 mg by mouth daily.   aspirin 325 MG tablet Take 81 mg by mouth daily.   COLACE 100 MG  capsule Generic drug:  docusate sodium Take 100 mg by mouth 2 (two) times daily.   HYDROcodone-acetaminophen 5-325 MG tablet Commonly known as:  NORCO Take 1 tablet by mouth every 4 (four) hours as needed for moderate pain.   Loratadine 10 MG Caps Take by mouth.   lovastatin 20 MG tablet Commonly known as:  MEVACOR Take 20 mg by mouth every evening.   meloxicam 7.5 MG tablet Commonly known as:  MOBIC Take 7.5 mg by mouth daily.   sildenafil 20 MG tablet Commonly known as:  REVATIO Take 3 to 5 tablets two hours before intercouse on an empty stomach.  Do not take with nitrates.       Allergies: No Known Allergies  Family History: Family History  Problem Relation Age of Onset  . Kidney disease Neg Hx   . Prostate cancer Neg Hx     Social History:  reports that he has been smoking Cigarettes.  He has been smoking about 1.00 pack per day. He has never used smokeless tobacco. He reports that he does not drink alcohol or use drugs.  ROS: UROLOGY Frequent Urination?:  Yes Hard to postpone urination?: No Burning/pain with urination?: No Get up at night to urinate?: Yes Leakage of urine?: Yes Urine stream starts and stops?: No Trouble starting stream?: No Do you have to strain to urinate?: No Blood in urine?: No Urinary tract infection?: No Sexually transmitted disease?: No Injury to kidneys or bladder?: No Painful intercourse?: No Weak stream?: No Erection problems?: No Penile pain?: No  Gastrointestinal Nausea?: No Vomiting?: No Indigestion/heartburn?: No Diarrhea?: No Constipation?: Yes  Constitutional Fever: No Night sweats?: No Weight loss?: No Fatigue?: Yes  Skin Skin rash/lesions?: No Itching?: No  Eyes Blurred vision?: No Double vision?: No  Ears/Nose/Throat Sore throat?: No Sinus problems?: Yes  Hematologic/Lymphatic Swollen glands?: No Easy bruising?: No  Cardiovascular Leg swelling?: No Chest pain?: No  Respiratory Cough?: Yes Shortness of breath?: No  Endocrine Excessive thirst?: No  Musculoskeletal Back pain?: No Joint pain?: Yes  Neurological Headaches?: No Dizziness?: No  Psychologic Depression?: No Anxiety?: No  Physical Exam: BP 119/74   Pulse 77   Resp 17   Ht 5\' 5"  (1.651 m)   Constitutional: Well nourished. Alert and oriented, No acute distress. HEENT: [place] AT, moist mucus membranes. Trachea midline, no masses. Cardiovascular: No clubbing, cyanosis, or edema. Respiratory: Normal respiratory effort, no increased work of breathing. GI: Abdomen is soft, non tender, non distended, no abdominal masses. Liver and spleen not palpable.  No hernias appreciated.  Stool sample for occult testing is not indicated.   GU: No CVA tenderness.  No bladder fullness or masses.  Patient with uncircumcised phallus. Foreskin easily retracted Urethral meatus is patent.  No penile discharge. No penile lesions or rashes. Scrotum without lesions, cysts, rashes and/or edema.  Testicles are located scrotally  bilaterally. No masses are appreciated in the testicles. Left and right epididymis are normal. Rectal: Patient with  normal sphincter tone. Anus and perineum without scarring or rashes. No rectal masses are appreciated. Prostate is approximately 50 grams, no nodules are appreciated. Seminal vesicles are normal. Skin: No rashes, bruises or suspicious lesions. Lymph: No cervical or inguinal adenopathy. Neurologic: Grossly intact, no focal deficits, moving all 4 extremities. Psychiatric: Normal mood and affect.  Laboratory Data: Lab Results  Component Value Date   WBC 5.5 09/11/2015   HGB 12.2 (L) 09/11/2015   HCT 36.6 (L) 09/11/2015   MCV 85.7 09/11/2015   PLT 225  09/11/2015    Lab Results  Component Value Date   CREATININE 0.96 09/11/2015     Lab Results  Component Value Date   HGBA1C  11/25/2009    5.5 (NOTE)                                                                       According to the ADA Clinical Practice Recommendations for 2011, when HbA1c is used as a screening test:   >=6.5%   Diagnostic of Diabetes Mellitus           (if abnormal result  is confirmed)  5.7-6.4%   Increased risk of developing Diabetes Mellitus  References:Diagnosis and Classification of Diabetes Mellitus,Diabetes Care,2011,34(Suppl 1):S62-S69 and Standards of Medical Care in         Diabetes - 2011,Diabetes Care,2011,34  (Suppl 1):S11-S61.    Lab Results  Component Value Date   TSH 0.288 (L) 01/04/2014       Component Value Date/Time   CHOL 166 01/04/2014 0433   HDL 30 (L) 01/04/2014 0433   VLDL 16 01/04/2014 0433   LDLCALC 120 (H) 01/04/2014 0433    Lab Results  Component Value Date   AST 15 09/11/2015   Lab Results  Component Value Date   ALT 6 (L) 09/11/2015     Pertinent Imaging: Results for PRIDENole, Robey SR. (MRN [id number]) as of 02/12/2016 13:17  Ref. Range 02/12/2016 10:14  Scan Result Unknown 138    Assessment & Plan:    1. Erectile dysfunction  - explained to  patient that Viagra is best taken on an empty stomach, two hours prior to intercourse  - script sent for sildenafil 20 mg tablets  - follow up in one month for SHIM score  2. BPH with LUTS  - IPSS score is 10/3  - Continue conservative management, avoiding bladder irritants and timed voiding's  - RTC in one month for further discussion  Return in about 1 month (around 03/14/2016) for SHIM and recheck.  These notes generated with voice recognition software. I apologize for typographical errors.  Michiel Cowboy, PA-C  [facility] [address] [place], [place] [address] [phone]

## 2016-02-12 ENCOUNTER — Ambulatory Visit (INDEPENDENT_AMBULATORY_CARE_PROVIDER_SITE_OTHER): Payer: Medicare HMO | Admitting: Urology

## 2016-02-12 ENCOUNTER — Encounter: Payer: Self-pay | Admitting: Urology

## 2016-02-12 VITALS — BP 119/74 | HR 77 | Resp 17 | Ht 65.0 in

## 2016-02-12 DIAGNOSIS — N138 Other obstructive and reflux uropathy: Secondary | ICD-10-CM

## 2016-02-12 DIAGNOSIS — N5082 Scrotal pain: Secondary | ICD-10-CM

## 2016-02-12 DIAGNOSIS — N529 Male erectile dysfunction, unspecified: Secondary | ICD-10-CM

## 2016-02-12 DIAGNOSIS — N528 Other male erectile dysfunction: Secondary | ICD-10-CM | POA: Diagnosis not present

## 2016-02-12 DIAGNOSIS — N401 Enlarged prostate with lower urinary tract symptoms: Secondary | ICD-10-CM

## 2016-02-12 LAB — BLADDER SCAN AMB NON-IMAGING: SCAN RESULT: 138

## 2016-02-12 MED ORDER — SILDENAFIL CITRATE 20 MG PO TABS: ORAL_TABLET | ORAL | 3 refills | Status: DC

## 2016-02-18 ENCOUNTER — Emergency Department
Admission: EM | Admit: 2016-02-18 | Discharge: 2016-02-18 | Disposition: A | Discharge status: Home / Self Care | Payer: Medicare HMO | Attending: Emergency Medicine | Admitting: Emergency Medicine

## 2016-02-18 ENCOUNTER — Emergency Department: Payer: Medicare HMO

## 2016-02-18 ENCOUNTER — Encounter: Payer: Self-pay | Admitting: Emergency Medicine

## 2016-02-18 DIAGNOSIS — R079 Chest pain, unspecified: Secondary | ICD-10-CM

## 2016-02-18 DIAGNOSIS — I10 Essential (primary) hypertension: Secondary | ICD-10-CM | POA: Insufficient documentation

## 2016-02-18 DIAGNOSIS — R0789 Other chest pain: Secondary | ICD-10-CM | POA: Insufficient documentation

## 2016-02-18 DIAGNOSIS — Z79899 Other long term (current) drug therapy: Secondary | ICD-10-CM | POA: Diagnosis not present

## 2016-02-18 DIAGNOSIS — Z7982 Long term (current) use of aspirin: Secondary | ICD-10-CM | POA: Diagnosis not present

## 2016-02-18 DIAGNOSIS — F17210 Nicotine dependence, cigarettes, uncomplicated: Secondary | ICD-10-CM | POA: Insufficient documentation

## 2016-02-18 LAB — CBC
HCT: 35.9 % — ABNORMAL LOW (ref 40.0–52.0)
Hemoglobin: 12.1 g/dL — ABNORMAL LOW (ref 13.0–18.0)
MCH: 29.8 pg (ref 26.0–34.0)
MCHC: 33.8 g/dL (ref 32.0–36.0)
MCV: 88.2 fL (ref 80.0–100.0)
PLATELETS: 198 10*3/uL (ref 150–440)
RBC: 4.07 MIL/uL — AB (ref 4.40–5.90)
RDW: 16 % — ABNORMAL HIGH (ref 11.5–14.5)
WBC: 7.6 10*3/uL (ref 3.8–10.6)

## 2016-02-18 LAB — COMPREHENSIVE METABOLIC PANEL
ALT: 8 U/L — AB (ref 17–63)
AST: 14 U/L — ABNORMAL LOW (ref 15–41)
Albumin: 3.4 g/dL — ABNORMAL LOW (ref 3.5–5.0)
Alkaline Phosphatase: 89 U/L (ref 38–126)
Anion gap: 6 (ref 5–15)
BUN: 20 mg/dL (ref 6–20)
CALCIUM: 9.1 mg/dL (ref 8.9–10.3)
CHLORIDE: 106 mmol/L (ref 101–111)
CO2: 27 mmol/L (ref 22–32)
CREATININE: 0.91 mg/dL (ref 0.61–1.24)
Glucose, Bld: 96 mg/dL (ref 65–99)
Potassium: 3.8 mmol/L (ref 3.5–5.1)
Sodium: 139 mmol/L (ref 135–145)
Total Bilirubin: 0.2 mg/dL — ABNORMAL LOW (ref 0.3–1.2)
Total Protein: 7.4 g/dL (ref 6.5–8.1)

## 2016-02-18 LAB — TROPONIN I: Troponin I: <0.03 ng/mL (ref <0.03)

## 2016-02-18 MED ORDER — ALBUTEROL SULFATE HFA 108 (90 BASE) MCG/ACT IN AERS: 2.0000 | INHALATION_SPRAY | Freq: Four times a day (QID) | RESPIRATORY_TRACT | 2 refills | Status: DC | PRN

## 2016-02-18 MED ORDER — IPRATROPIUM-ALBUTEROL 0.5-2.5 (3) MG/3ML IN SOLN
3.0000 mL | Freq: Once | RESPIRATORY_TRACT | Status: AC
  Administered 2016-02-18: 3 mL via RESPIRATORY_TRACT
  Filled 2016-02-18: qty 3

## 2016-02-18 NOTE — ED Provider Notes (Signed)
[facility] Emergency Department Provider Note  Time seen: 4:01 PM  I have reviewed the triage vital signs and the nursing notes.   HISTORY  Chief Complaint Chest Pain    HPI [name] Crager Sr. is a 82 y.o. male with a past medical history of hypertension, CVA with left-sided deficits, arthritis, gout, who presents to the emergency department with chest pain. According to the patient for the last week he has had a mild cough. States the cough is improved from for the past 2 days he has been experiencing chest discomfort in the center of his chest, worse with deep inspiration cough or movement of the torso. Denies fever. Denies sputum production. Denies leg pain or swelling. Patient does smoke cigarettes daily. Denies any prior heart attacks.  Past Medical History:  Diagnosis Date  . Arthritis   . Gout   . Hypertension   . Stroke [facility](HCC)    left sided weakness    There are no active problems to display for this patient.   Past Surgical History:  Procedure Laterality Date  . INGUINAL HERNIA REPAIR Right 09/21/2015   Procedure: HERNIA REPAIR INGUINAL ADULT;  Surgeon: Nadeen LandauJarvis Wilton Smith, MD;  Location: [facility] ORS;  Service: General;  Laterality: Right;    Prior to Admission medications   Medication Sig Start Date End Date Taking? Authorizing Provider  allopurinol (ZYLOPRIM) 100 MG tablet Take 100 mg by mouth daily.    Historical Provider, MD  amLODipine (NORVASC) 5 MG tablet Take 5 mg by mouth daily.    Historical Provider, MD  aspirin 325 MG tablet Take 81 mg by mouth daily.     Historical Provider, MD  docusate sodium (COLACE) 100 MG capsule Take 100 mg by mouth 2 (two) times daily.    Historical Provider, MD  HYDROcodone-acetaminophen (NORCO) 5-325 MG tablet Take 1 tablet by mouth every 4 (four) hours as needed for moderate pain. Patient not taking: Reported on 02/12/2016 09/21/15   Nadeen LandauJarvis Wilton Smith, MD  Loratadine 10 MG CAPS Take by mouth.    Historical  Provider, MD  lovastatin (MEVACOR) 20 MG tablet Take 20 mg by mouth every evening.    Historical Provider, MD  meloxicam (MOBIC) 7.5 MG tablet Take 7.5 mg by mouth daily.    Historical Provider, MD  sildenafil (REVATIO) 20 MG tablet Take 3 to 5 tablets two hours before intercouse on an empty stomach.  Do not take with nitrates. 02/12/16   Harle BattiestShannon A McGowan, PA-C    Allergies  Allergen Reactions  . Hydrochlorothiazide   . Lisinopril     Family History  Problem Relation Age of Onset  . Kidney disease Neg Hx   . Prostate cancer Neg Hx     Social History Social History  Substance Use Topics  . Smoking status: Current Every Day Smoker    Packs/day: 1.00    Types: Cigarettes  . Smokeless tobacco: Never Used  . Alcohol use No     Comment: previous heavy drinker now 1x month    Review of Systems Constitutional: Negative for fever Cardiovascular: Intermittent chest pain in the center of his chest. Denies any chest pain at rest. Only when coughing or moving. Denies nausea or diaphoresis. Denies shortness of breath. Respiratory: Negative for shortness of breath. Gastrointestinal: Negative for abdominal pain Genitourinary: Negative for dysuria. Musculoskeletal: Negative for back pain. Neurological: Negative for headache 10-point ROS otherwise negative.  ____________________________________________   PHYSICAL EXAM:  VITAL SIGNS: ED Triage Vitals  Enc Vitals Group  BP 02/18/16 1553 116/78     Pulse Rate 02/18/16 1553 79     Resp 02/18/16 1553 20     Temp 02/18/16 1553 98.4 F (36.9 C)     Temp Source 02/18/16 1553 Oral     SpO2 02/18/16 1548 95 %     Weight 02/18/16 1553 130 lb 4.8 oz (59.1 kg)     Height 02/18/16 1553 5\' 8"  (1.727 m)     Head Circumference --      Peak Flow --      Pain Score --      Pain Loc --      Pain Edu? --      Excl. in GC? --     Constitutional: Alert and oriented. Well appearing and in no distress. Eyes: Normal exam ENT   Head:  Normocephalic and atraumatic.   Mouth/Throat: Mucous membranes are moist. Cardiovascular: Normal rate, regular rhythm. No murmur Respiratory: Normal respiratory effort without tachypnea nor retractions. Moderate expiratory wheeze bilaterally. Gastrointestinal: Soft and nontender. No distention. Musculoskeletal: Nontender with normal range of motion in all extremities. No lower extremity tenderness or edema. Neurologic:  Normal speech and language.  Skin:  Skin is warm, dry and intact.  Psychiatric: Mood and affect are normal. Speech and behavior are normal.   ____________________________________________    EKG  EKG reviewed and interpreted by myself shows normal sinus rhythm at 78 bpm, slightly widened QRS, left axis deviation, nonspecific ST changes. No concerning ST elevation.  ____________________________________________    RADIOLOGY  Chest x-ray shows no acute abnormality  ____________________________________________   INITIAL IMPRESSION / ASSESSMENT AND PLAN / ED COURSE  Pertinent labs & imaging results that were available during my care of the patient were reviewed by me and considered in my medical decision making (see chart for details).  The patient presents the emergency department with chest discomfort for the past 2 days, worse with cough or movement. Overall the patient appears well. Denies any symptoms while lying in bed. We will check labs, chest x-ray, and closely monitor in the emergency department.  Labs are largely within normal limits. Troponin negative. Chest x-ray normal. We will dose one DuoNeb in the emergency department. We will repeat a troponin at the repeat troponin is negative we'll discharge with albuterol. Patient is agreeable to plan. Asking for something to eat and asking when he can go home.  Repeat troponin is negative. We will discharge the patient home with albuterol inhaler PCP follow-up. Patient and family are  agreeable.  ____________________________________________   FINAL CLINICAL IMPRESSION(S) / ED DIAGNOSES  Chest pain    Minna AntisKevin Jung Ingerson, MD 02/18/16 1956

## 2016-02-18 NOTE — ED Triage Notes (Signed)
[facility] [place] and has been complaining of chest pain that started last night. Pt states he has had a productive cough and runny nose for one week. Pt states he has pain to center of chest that is worse with movement, cough or deep breath.

## 2016-02-18 NOTE — ED Notes (Signed)
Patient transported to X-ray 

## 2016-02-18 NOTE — ED Notes (Signed)
Helped pt to the bathroom 

## 2016-02-18 NOTE — Discharge Instructions (Signed)
You have been seen in the emergency department today for chest pain. Your workup has shown normal results. As we discussed please follow-up with your primary care physician in the next 1-2 days for recheck. Return to the emergency department for any further chest pain, trouble breathing, or any other symptom personally concerning to yourself. °

## 2016-03-14 ENCOUNTER — Encounter: Payer: Self-pay | Admitting: Urology

## 2016-03-14 ENCOUNTER — Ambulatory Visit (INDEPENDENT_AMBULATORY_CARE_PROVIDER_SITE_OTHER): Payer: Medicare HMO | Admitting: Urology

## 2016-03-14 VITALS — BP 148/87 | HR 63 | Ht 62.0 in | Wt 129.3 lb

## 2016-03-14 DIAGNOSIS — N528 Other male erectile dysfunction: Secondary | ICD-10-CM

## 2016-03-14 DIAGNOSIS — N138 Other obstructive and reflux uropathy: Secondary | ICD-10-CM

## 2016-03-14 DIAGNOSIS — N401 Enlarged prostate with lower urinary tract symptoms: Secondary | ICD-10-CM | POA: Diagnosis not present

## 2016-03-14 DIAGNOSIS — N529 Male erectile dysfunction, unspecified: Secondary | ICD-10-CM

## 2016-03-14 MED ORDER — TAMSULOSIN HCL 0.4 MG PO CAPS: 0.4000 mg | ORAL_CAPSULE | Freq: Every day | ORAL | 3 refills | Status: AC

## 2016-03-14 NOTE — Progress Notes (Signed)
03/14/2016 10:04 AM   [name] Grim Sr. January 26, 1934 [id number]  Referring provider: Leanna SatoLinda M Miles, MD [address] [place], [place] [id number]  Chief Complaint  Patient presents with  . Benign Prostatic Hypertrophy    1 month follow up  . Erectile Dysfunction    HPI: Patient is an 82 year old African American male who presents today for a one month follow up after starting sildenafil for ED.    Background History Patient was referred by his PCP, Dr. Marvis MoellerMiles, for scrotal discomfort.  Upon further questioning, the patient is actually complaining of erectile dysfunction.  He states since he had his hernia surgery he is having issues with erections.  He states prior to the surgery he was always "ready for action."  He states he is not longer having spontaneous erections.  He tried one Viagra, but it was ineffective.  He does not have pain or curvature with his erections.  Today, patient's SHIM score is 25.  He states he has not had the opportunity to utilize the sildenafil, so this score is incongruous with his previous complaint.    However, his IPSS score was 10, which is moderate lower urinary tract symptomatology.  He is mixed with his quality life due to his urinary symptoms.   His PVR was 138 mL.  His major complaint was nocturia and leakage of urine .  He has had these symptoms for several years.  He denies any dysuria, hematuria or suprapubic pain.   He also denies any recent fevers, chills, nausea or vomiting.  He does not have a family history of PCa.  This has not changed since his last visit.        IPSS    Row Name 02/12/16 1000         International Prostate Symptom Score   How often have you had the sensation of not emptying your bladder? Not at All     How often have you had to urinate less than every two hours? Less than half the time     How often have you found you stopped and started again several times when you urinated? Not at All     How often have you found  it difficult to postpone urination? Almost always     How often have you had a weak urinary stream? Not at All     How often have you had to strain to start urination? Not at All     How many times did you typically get up at night to urinate? 3 Times     Total IPSS Score 10       Quality of Life due to urinary symptoms   If you were to spend the rest of your life with your urinary condition just the way it is now how would you feel about that? Mixed        Score:  1-7 Mild 8-19 Moderate 20-35 Severe   PMH: Past Medical History:  Diagnosis Date  . Arthritis   . Gout   . Hypertension   . Stroke [facility](HCC)    left sided weakness    Surgical History: Past Surgical History:  Procedure Laterality Date  . INGUINAL HERNIA REPAIR Right 09/21/2015   Procedure: HERNIA REPAIR INGUINAL ADULT;  Surgeon: Nadeen LandauJarvis Wilton Smith, MD;  Location: [facility] ORS;  Service: General;  Laterality: Right;    Home Medications:    Medication List       Accurate as of 03/14/16  10:04 AM. Always use your most recent med list.          albuterol 108 (90 Base) MCG/ACT inhaler Commonly known as:  PROVENTIL HFA;VENTOLIN HFA Inhale 2 puffs into the lungs every 6 (six) hours as needed for wheezing or shortness of breath.   allopurinol 100 MG tablet Commonly known as:  ZYLOPRIM Take 100 mg by mouth daily.   amLODipine 5 MG tablet Commonly known as:  NORVASC Take 5 mg by mouth daily.   aspirin 325 MG tablet Take 81 mg by mouth daily.   aspirin EC 81 MG tablet Take 81 mg by mouth daily.   COLACE 100 MG capsule Generic drug:  docusate sodium Take 100 mg by mouth 2 (two) times daily.   HYDROcodone-acetaminophen 5-325 MG tablet Commonly known as:  NORCO Take 1 tablet by mouth every 4 (four) hours as needed for moderate pain.   Loratadine 10 MG Caps Take by mouth.   loratadine 10 MG tablet Commonly known as:  CLARITIN Take 10 mg by mouth daily.   lovastatin 20 MG tablet Commonly known as:   MEVACOR Take 20 mg by mouth every evening.   meloxicam 7.5 MG tablet Commonly known as:  MOBIC Take 7.5 mg by mouth daily as needed.   sildenafil 20 MG tablet Commonly known as:  REVATIO Take 3 to 5 tablets two hours before intercouse on an empty stomach.  Do not take with nitrates.   tamsulosin 0.4 MG Caps capsule Commonly known as:  FLOMAX Take 1 capsule (0.4 mg total) by mouth daily.       Allergies:  Allergies  Allergen Reactions  . Hydrochlorothiazide   . Lisinopril     Family History: Family History  Problem Relation Age of Onset  . Kidney disease Neg Hx   . Prostate cancer Neg Hx     Social History:  reports that he has been smoking Cigarettes.  He has been smoking about 1.00 pack per day. He has never used smokeless tobacco. He reports that he does not drink alcohol or use drugs.  ROS: UROLOGY Frequent Urination?: No Hard to postpone urination?: No Burning/pain with urination?: No Get up at night to urinate?: No Leakage of urine?: No Urine stream starts and stops?: No Trouble starting stream?: No Do you have to strain to urinate?: No Blood in urine?: No Urinary tract infection?: No Sexually transmitted disease?: No Injury to kidneys or bladder?: No Painful intercourse?: No Weak stream?: No Erection problems?: No Penile pain?: No  Gastrointestinal Nausea?: No Vomiting?: No Indigestion/heartburn?: No Diarrhea?: No Constipation?: No  Constitutional Fever: No Night sweats?: No Fatigue?: No  Skin Skin rash/lesions?: No Itching?: No  Eyes Blurred vision?: No Double vision?: No  Ears/Nose/Throat Sore throat?: No Sinus problems?: No  Hematologic/Lymphatic Swollen glands?: No Easy bruising?: No  Cardiovascular Leg swelling?: No Chest pain?: No  Respiratory Cough?: Yes Shortness of breath?: No  Endocrine Excessive thirst?: No  Musculoskeletal Back pain?: No Joint pain?: No  Neurological Headaches?: No Dizziness?:  No  Psychologic Depression?: No Anxiety?: No  Physical Exam: BP (!) 148/87   Pulse 63   Ht 5\' 2"  (1.575 m)   Wt 129 lb 4.8 oz (58.7 kg)   BMI 23.65 kg/m   Constitutional: Well nourished. Alert and oriented, No acute distress. HEENT: [place] AT, moist mucus membranes. Trachea midline, no masses. Cardiovascular: No clubbing, cyanosis, or edema. Respiratory: Normal respiratory effort, no increased work of breathing. Skin: No rashes, bruises or suspicious lesions. Lymph: No cervical  or inguinal adenopathy. Neurologic: Grossly intact, no focal deficits, moving all 4 extremities. Psychiatric: Normal mood and affect.  Laboratory Data: Lab Results  Component Value Date   WBC 7.6 02/18/2016   HGB 12.1 (L) 02/18/2016   HCT 35.9 (L) 02/18/2016   MCV 88.2 02/18/2016   PLT 198 02/18/2016    Lab Results  Component Value Date   CREATININE 0.91 02/18/2016     Lab Results  Component Value Date   HGBA1C  11/25/2009    5.5 (NOTE)                                                                       According to the ADA Clinical Practice Recommendations for 2011, when HbA1c is used as a screening test:   >=6.5%   Diagnostic of Diabetes Mellitus           (if abnormal result  is confirmed)  5.7-6.4%   Increased risk of developing Diabetes Mellitus  References:Diagnosis and Classification of Diabetes Mellitus,Diabetes Care,2011,34(Suppl 1):S62-S69 and Standards of Medical Care in         Diabetes - 2011,Diabetes Care,2011,34  (Suppl 1):S11-S61.    Lab Results  Component Value Date   TSH 0.288 (L) 01/04/2014       Component Value Date/Time   CHOL 166 01/04/2014 0433   HDL 30 (L) 01/04/2014 0433   VLDL 16 01/04/2014 0433   LDLCALC 120 (H) 01/04/2014 0433    Lab Results  Component Value Date   AST 14 (L) 02/18/2016   Lab Results  Component Value Date   ALT 8 (L) 02/18/2016     Pertinent Imaging: Results for PRIDESaban, Heinlen SR. (MRN [id number]) as of 02/12/2016 13:17  Ref.  Range 02/12/2016 10:14  Scan Result Unknown 138    Assessment & Plan:    1. Erectile dysfunction  - reminded patient that Viagra is best taken on an empty stomach, two hours prior to intercourse  - states he has not had an opportunity to use the medication  - follow up in one month for SHIM score  2. BPH with LUTS  - IPSS score is 10/3   - Continue conservative management, avoiding bladder irritants and timed voiding's  - We will start tamsulosin.  He is advised to take it 30 minutes after a meal.  I advised him of the side effects, such as: retrograde ejaculation, sinus congestion, nasal congestion, rhinorrhea, rhinitis, dizziness, and seasonal allergic rhinitis.  - has allergy to HCTZ, so cautioned patient concerning possible allergic reaction and to stop medication if a reaction occurs  - RTC in one month for IPSS and PVR  Return in about 1 month (around 04/13/2016) for IPSS and PVR.  These notes generated with voice recognition software. I apologize for typographical errors.  Michiel Cowboy, PA-C  [facility] [address], [address] [place], [place] [address] [phone]

## 2016-04-14 ENCOUNTER — Ambulatory Visit (INDEPENDENT_AMBULATORY_CARE_PROVIDER_SITE_OTHER): Payer: Medicare HMO | Admitting: Urology

## 2016-04-14 ENCOUNTER — Encounter: Payer: Self-pay | Admitting: Urology

## 2016-04-14 VITALS — BP 132/83 | HR 79 | Ht 65.0 in | Wt 128.3 lb

## 2016-04-14 DIAGNOSIS — N400 Enlarged prostate without lower urinary tract symptoms: Secondary | ICD-10-CM

## 2016-04-14 DIAGNOSIS — N529 Male erectile dysfunction, unspecified: Secondary | ICD-10-CM

## 2016-04-14 LAB — BLADDER SCAN AMB NON-IMAGING: Scan Result: 0

## 2016-04-14 NOTE — Progress Notes (Signed)
04/14/2016 10:01 AM   [name] Julio Sr. [birth year] [id number]  Referring provider: [name], MD [address] [place], [place] [address]  Chief Complaint  Patient presents with  . Benign Prostatic Hypertrophy    One month follow up  . Erectile Dysfunction    HPI: Patient is an 82 year old African American male who presents today for a one month follow up after starting sildenafil for ED.    Background History Patient was referred by his PCP, Dr. Marvis Moeller, for scrotal discomfort.  Upon further questioning, the patient is actually complaining of erectile dysfunction.  He states since he had his hernia surgery he is having issues with erections.  He states prior to the surgery he was always "ready for action."  He states he is not longer having spontaneous erections.  He tried one Viagra, but it was ineffective.  He does not have pain or curvature with his erections.  He could not afford the Viagra.    IPSS score today is 7, which is mild LUTS.  He is mostly satisfied with his quality of life due to his urinary symptoms.  His PVR was 0 mL.  His previous IPSS score was 10/3.    His previous PVR was 138 mL.  His major complaint was nocturia and leakage of urine .  He has had these symptoms for several years.  He denies any dysuria, hematuria or suprapubic pain.   He also denies any recent fevers, chills, nausea or vomiting.  He does not have a family history of PCa.  This has not changed since his last visit.        IPSS    Row Name 04/14/16 0900         International Prostate Symptom Score   How often have you had the sensation of not emptying your bladder? Not at All     How often have you had to urinate less than every two hours? Not at All     How often have you found you stopped and started again several times when you urinated? Not at All     How often have you found it difficult to postpone urination? About half the time     How often have you had a weak urinary  stream? Not at All     How often have you had to strain to start urination? Not at All     How many times did you typically get up at night to urinate? 4 Times     Total IPSS Score 7       Quality of Life due to urinary symptoms   If you were to spend the rest of your life with your urinary condition just the way it is now how would you feel about that? Mostly Satisfied        Score:  1-7 Mild 8-19 Moderate 20-35 Severe   PMH: Past Medical History:  Diagnosis Date  . Arthritis   . Gout   . Hypertension   . Stroke [facility])    left sided weakness    Surgical History: Past Surgical History:  Procedure Laterality Date  . INGUINAL HERNIA REPAIR Right 09/21/2015   Procedure: HERNIA REPAIR INGUINAL ADULT;  Surgeon: [name], MD;  Location: [facility] ORS;  Service: General;  Laterality: Right;    Home Medications:    Medication List       Accurate as of 04/14/16 10:01 AM. Always use your most recent med  list.          albuterol 108 (90 Base) MCG/ACT inhaler Commonly known as:  PROVENTIL HFA;VENTOLIN HFA Inhale 2 puffs into the lungs every 6 (six) hours as needed for wheezing or shortness of breath.   allopurinol 100 MG tablet Commonly known as:  ZYLOPRIM Take 100 mg by mouth daily.   amLODipine 5 MG tablet Commonly known as:  NORVASC Take 5 mg by mouth daily.   aspirin 325 MG tablet Take 81 mg by mouth daily.   aspirin EC 81 MG tablet Take 81 mg by mouth daily.   COLACE 100 MG capsule Generic drug:  docusate sodium Take 100 mg by mouth 2 (two) times daily.   HYDROcodone-acetaminophen 5-325 MG tablet Commonly known as:  NORCO Take 1 tablet by mouth every 4 (four) hours as needed for moderate pain.   ipratropium 0.03 % nasal spray Commonly known as:  ATROVENT Place 2 sprays into both nostrils every 12 (twelve) hours.   Loratadine 10 MG Caps Take by mouth.   loratadine 10 MG tablet Commonly known as:  CLARITIN Take 10 mg by mouth daily.     lovastatin 20 MG tablet Commonly known as:  MEVACOR Take 20 mg by mouth every evening.   meloxicam 7.5 MG tablet Commonly known as:  MOBIC Take 7.5 mg by mouth daily as needed.   sildenafil 20 MG tablet Commonly known as:  REVATIO Take 3 to 5 tablets two hours before intercouse on an empty stomach.  Do not take with nitrates.   tamsulosin 0.4 MG Caps capsule Commonly known as:  FLOMAX Take 1 capsule (0.4 mg total) by mouth daily.       Allergies:  Allergies  Allergen Reactions  . Hydrochlorothiazide   . Lisinopril     Family History: Family History  Problem Relation Age of Onset  . Kidney disease Neg Hx   . Prostate cancer Neg Hx     Social History:  reports that he has been smoking Cigarettes.  He has been smoking about 1.00 pack per day. He has never used smokeless tobacco. He reports that he does not drink alcohol or use drugs.  ROS: UROLOGY Frequent Urination?: No Hard to postpone urination?: No Burning/pain with urination?: No Get up at night to urinate?: No Leakage of urine?: No Urine stream starts and stops?: No Trouble starting stream?: No Do you have to strain to urinate?: No Blood in urine?: No Urinary tract infection?: No Sexually transmitted disease?: No Injury to kidneys or bladder?: No Painful intercourse?: No Weak stream?: No Erection problems?: No Penile pain?: No  Gastrointestinal Nausea?: No Vomiting?: No Indigestion/heartburn?: No Diarrhea?: No Constipation?: No  Constitutional Fever: No Night sweats?: No Weight loss?: No Fatigue?: No  Skin Skin rash/lesions?: No Itching?: No  Eyes Blurred vision?: No Double vision?: No  Ears/Nose/Throat Sore throat?: No Sinus problems?: No  Hematologic/Lymphatic Swollen glands?: No Easy bruising?: No  Cardiovascular Leg swelling?: No Chest pain?: No  Respiratory Cough?: No Shortness of breath?: No  Endocrine Excessive thirst?: No  Musculoskeletal Back pain?: No Joint  pain?: No  Neurological Headaches?: No Dizziness?: No  Psychologic Depression?: No Anxiety?: No  Physical Exam: BP 132/83   Pulse 79   Ht 5\' 5"  (1.651 m)   Wt 128 lb 4.8 oz (58.2 kg)   BMI 21.35 kg/m   Constitutional: Well nourished. Alert and oriented, No acute distress. HEENT: [place] AT, moist mucus membranes. Trachea midline, no masses. Cardiovascular: No clubbing, cyanosis, or edema. Respiratory: Normal  respiratory effort, no increased work of breathing. Skin: No rashes, bruises or suspicious lesions. Lymph: No cervical or inguinal adenopathy. Neurologic: Grossly intact, no focal deficits, moving all 4 extremities. Psychiatric: Normal mood and affect.  Laboratory Data: Lab Results  Component Value Date   WBC 7.6 02/18/2016   HGB 12.1 (L) 02/18/2016   HCT 35.9 (L) 02/18/2016   MCV 88.2 02/18/2016   PLT 198 02/18/2016    Lab Results  Component Value Date   CREATININE 0.91 02/18/2016     Lab Results  Component Value Date   HGBA1C  11/25/2009    5.5 (NOTE)                                                                       According to the ADA Clinical Practice Recommendations for 2011, when HbA1c is used as a screening test:   >=6.5%   Diagnostic of Diabetes Mellitus           (if abnormal result  is confirmed)  5.7-6.4%   Increased risk of developing Diabetes Mellitus  References:Diagnosis and Classification of Diabetes Mellitus,Diabetes Care,2011,34(Suppl 1):S62-S69 and Standards of Medical Care in         Diabetes - 2011,Diabetes Care,2011,34  (Suppl 1):S11-S61.    Lab Results  Component Value Date   TSH 0.288 (L) 01/04/2014       Component Value Date/Time   CHOL 166 01/04/2014 0433   HDL 30 (L) 01/04/2014 0433   VLDL 16 01/04/2014 0433   LDLCALC 120 (H) 01/04/2014 0433    Lab Results  Component Value Date   AST 14 (L) 02/18/2016   Lab Results  Component Value Date   ALT 8 (L) 02/18/2016     Pertinent Imaging: Results for PRIDRainey Pines, Story  SR. (MRN [id number]) as of 04/14/2016 09:56  Ref. Range 04/14/2016 09:20  Scan Result Unknown 0   Assessment & Plan:    1. Erectile dysfunction  - medication is cost prohibitive  2. BPH with LUTS  - IPSS score is 7/2, it is improving  - Continue conservative management, avoiding bladder irritants and timed voiding's  - He will continue the tamsulosin  - RTC in one year for IPSS, exam and PVR  Return in about 1 year (around 04/14/2017) for IPSS, PVR and exam.  These notes generated with voice recognition software. I apologize for typographical errors.  Michiel CowboySHANNON Bellatrix Devonshire, PA-C  [facility] [address], [address] [place], [place] [address] [phone]

## 2016-09-15 ENCOUNTER — Ambulatory Visit (INDEPENDENT_AMBULATORY_CARE_PROVIDER_SITE_OTHER): Payer: Medicare HMO | Admitting: Vascular Surgery

## 2016-09-17 ENCOUNTER — Ambulatory Visit (INDEPENDENT_AMBULATORY_CARE_PROVIDER_SITE_OTHER): Payer: Medicare HMO | Admitting: Vascular Surgery

## 2016-09-17 VITALS — BP 146/86 | HR 85 | Resp 16 | Ht 62.0 in | Wt 125.0 lb

## 2016-09-17 DIAGNOSIS — I739 Peripheral vascular disease, unspecified: Secondary | ICD-10-CM | POA: Diagnosis not present

## 2016-09-17 DIAGNOSIS — M79605 Pain in left leg: Secondary | ICD-10-CM | POA: Diagnosis not present

## 2016-09-17 DIAGNOSIS — F17200 Nicotine dependence, unspecified, uncomplicated: Secondary | ICD-10-CM | POA: Insufficient documentation

## 2016-09-17 NOTE — Progress Notes (Signed)
Subjective:    Patient ID: [name] Tisdale Sr., male    DOB: 07-Jun-1934, 83 y.o.   MRN: [id number] Chief Complaint  Patient presents with  . Re-evaluation    Discuss pain from Gout   Patient presents with a chief complaint of worsening "left lower extremity pain". He has been referred by Dr. Marvis MoellerMiles. Patient endorses a history of worsening left lower extremity pain. The pain is located from behind the knee cap distally toward his toes. Pain is mostly at night. Not always associated with activity. Pain has worsened over the last couple of months. No right lower extremity symptoms. He denies any wound or ulcer formation.    Review of Systems  Constitutional: Negative.   HENT: Negative.   Eyes: Negative.   Respiratory: Negative.   Cardiovascular:       Left Lower Extremity Pain  Gastrointestinal: Negative.   Endocrine: Negative.   Genitourinary: Negative.   Musculoskeletal: Negative.   Skin: Negative.   Allergic/Immunologic: Negative.   Neurological: Negative.   Hematological: Negative.   Psychiatric/Behavioral: Negative.       Objective:   Physical Exam  Constitutional: He is oriented to person, place, and time. He appears well-developed and well-nourished.  HENT:  Head: Normocephalic and atraumatic.  Right Ear: External ear normal.  Left Ear: External ear normal.  Eyes: Conjunctivae and EOM are normal. Pupils are equal, round, and reactive to light.  Neck: Normal range of motion.  Cardiovascular: Normal rate, regular rhythm and normal heart sounds.   Pulses:      Radial pulses are 2+ on the right side, and 2+ on the left side.       Dorsalis pedis pulses are 1+ on the right side, and 0 on the left side.       Posterior tibial pulses are 1+ on the right side, and 0 on the left side.  No ulceration noted.   Pulmonary/Chest: Effort normal and breath sounds normal.  Musculoskeletal: Normal range of motion. He exhibits edema (Mild Bilateral Edema Noted).  Neurological: He is  alert and oriented to person, place, and time.  Skin: Skin is warm and dry.  Psychiatric: He has a normal mood and affect. His behavior is normal. Judgment and thought content normal.   BP (!) 146/86 (BP Location: Left Arm)   Pulse 85   Resp 16   Ht 5\' 2"  (1.575 m)   Wt 125 lb (56.7 kg)   BMI 22.86 kg/m   Past Medical History:  Diagnosis Date  . Arthritis   . Gout   . Hypertension   . Stroke [facility](HCC)    left sided weakness   Social History   Social History  . Marital status: Married    Spouse name: N/A  . Number of children: N/A  . Years of education: N/A   Occupational History  . Not on file.   Social History Main Topics  . Smoking status: Current Every Day Smoker    Packs/day: 1.00    Types: Cigarettes  . Smokeless tobacco: Never Used  . Alcohol use No     Comment: previous heavy drinker now 1x month  . Drug use: No  . Sexual activity: Not on file   Other Topics Concern  . Not on file   Social History Narrative  . No narrative on file   Past Surgical History:  Procedure Laterality Date  . INGUINAL HERNIA REPAIR Right 09/21/2015   Procedure: HERNIA REPAIR INGUINAL ADULT;  Surgeon: Nadeen LandauJarvis Wilton Smith, MD;  Location: [facility] ORS;  Service: General;  Laterality: Right;   Family History  Problem Relation Age of Onset  . Kidney disease Neg Hx   . Prostate cancer Neg Hx    Allergies  Allergen Reactions  . Hydrochlorothiazide   . Lisinopril       Assessment & Plan:  Patient presents with a chief complaint of worsening "left lower extremity pain". He has been referred by Dr. Marvis Moeller. Patient endorses a history of worsening left lower extremity pain. The pain is located from behind the knee cap distally toward his toes. Pain is mostly at night. Not always associated with activity. Pain has worsened over the last couple of months. No right lower extremity symptoms. He denies any wound or ulcer formation.   1. PAD (peripheral artery disease) (HCC) - Stable Patient  with multiple risk factors for PAD. Will order ABI and Duplex to rule out any contributing PAD.  - VAS [place] ABI WITH/WO TBI; Future - VAS [place] LOWER EXTREMITY ARTERIAL DUPLEX; Future  2. Lower extremity pain, left - Worsening Strong possibility of contributing PAD. Will order ABI and duplex to assess LLE arterial system.  3. Tobacco dependence - Stable I have discussed (approximately 5 minutes) with the patient the role of tobacco in the pathogenesis of atherosclerosis and its effect on the progression of the disease, impact on the durability of interventions and its limitations on the formation of collateral pathways. I have recommended absolute tobacco cessation. I have discussed various options available for assistance with tobacco cessation including over the counter methods (Nicotine gum, patch and lozenges). We also discussed prescription options (Chantix, Nicotine Inhaler / Nasal Spray). The patient is not interested in pursuing any prescription tobacco cessation options at this time. The patient voices their understanding.   Current Outpatient Prescriptions on File Prior to Visit  Medication Sig Dispense Refill  . albuterol (PROVENTIL HFA;VENTOLIN HFA) 108 (90 Base) MCG/ACT inhaler Inhale 2 puffs into the lungs every 6 (six) hours as needed for wheezing or shortness of breath. 1 Inhaler 2  . allopurinol (ZYLOPRIM) 100 MG tablet Take 100 mg by mouth daily.    Mar[place] Kitchen amLODipine (NORVASC) 5 MG tablet Take 5 mg by mouth daily.    Mar[place] Kitchen aspirin EC 81 MG tablet Take 81 mg by mouth daily.    Mar[place] Kitchen docusate sodium (COLACE) 100 MG capsule Take 100 mg by mouth 2 (two) times daily.    Marland Kitchen HYDROcodone-acetaminophen (NORCO) 5-325 MG tablet Take 1 tablet by mouth every 4 (four) hours as needed for moderate pain. 12 tablet 0  . ipratropium (ATROVENT) 0.03 % nasal spray Place 2 sprays into both nostrils every 12 (twelve) hours.    Marland Kitchen loratadine (CLARITIN) 10 MG tablet Take 10 mg by mouth daily.    . Loratadine 10 MG CAPS  Take by mouth.    . lovastatin (MEVACOR) 20 MG tablet Take 20 mg by mouth every evening.    . meloxicam (MOBIC) 7.5 MG tablet Take 7.5 mg by mouth daily as needed.     . sildenafil (REVATIO) 20 MG tablet Take 3 to 5 tablets two hours before intercouse on an empty stomach.  Do not take with nitrates. 50 tablet 3  . tamsulosin (FLOMAX) 0.4 MG CAPS capsule Take 1 capsule (0.4 mg total) by mouth daily. 90 capsule 3   No current facility-administered medications on file prior to visit.     There are no Patient Instructions on file for this visit. No Follow-up on file.   KIMBERLY  A STEGMAYER, PA-C

## 2016-09-18 ENCOUNTER — Ambulatory Visit (INDEPENDENT_AMBULATORY_CARE_PROVIDER_SITE_OTHER): Payer: Medicare HMO

## 2016-09-18 ENCOUNTER — Encounter (INDEPENDENT_AMBULATORY_CARE_PROVIDER_SITE_OTHER): Payer: Self-pay | Admitting: Vascular Surgery

## 2016-09-18 ENCOUNTER — Ambulatory Visit (INDEPENDENT_AMBULATORY_CARE_PROVIDER_SITE_OTHER): Payer: Medicare HMO | Admitting: Vascular Surgery

## 2016-09-18 VITALS — BP 126/65 | HR 75 | Resp 16

## 2016-09-18 DIAGNOSIS — F17200 Nicotine dependence, unspecified, uncomplicated: Secondary | ICD-10-CM | POA: Diagnosis not present

## 2016-09-18 DIAGNOSIS — I739 Peripheral vascular disease, unspecified: Secondary | ICD-10-CM

## 2016-09-18 NOTE — Progress Notes (Signed)
Subjective:    Patient ID: [name] Lahmann Sr., male    DOB: [birth year], 83 y.o.   MRN: [id number] Chief Complaint  Patient presents with  . Follow-up   Patient presents to review vascular studies. He was last seen yesterday with a chief complaint of LLE pain. The patient underwent an ABI which showed Right ABI: 0.67 and Left 0.42 (no previous for comparison). A limited left lower extremity arterial duplex is notable for a significant stenosis in the SFA. Unable to complete full duplex due to patients pain tolerance.    Review of Systems Constitutional: Negative.   HENT: Negative.   Eyes: Negative.   Respiratory: Negative.   Cardiovascular:       Left Lower Extremity Pain  Gastrointestinal: Negative.   Endocrine: Negative.   Genitourinary: Negative.   Musculoskeletal: Negative.   Skin: Negative.   Allergic/Immunologic: Negative.   Neurological: Negative.   Hematological: Negative.   Psychiatric/Behavioral: Negative.    Objective:   Physical Exam Constitutional: He is oriented to person, place, and time. He appears well-developed and well-nourished.  HENT:  Head: Normocephalic and atraumatic.  Right Ear: External ear normal.  Left Ear: External ear normal.  Eyes: Conjunctivae and EOM are normal. Pupils are equal, round, and reactive to light.  Neck: Normal range of motion.  Cardiovascular: Normal rate, regular rhythm and normal heart sounds.   Pulses:      Radial pulses are 2+ on the right side, and 2+ on the left side.       Dorsalis pedis pulses are 1+ on the right side, and 0 on the left side.       Posterior tibial pulses are 1+ on the right side, and 0 on the left side.  No ulceration noted.   Pulmonary/Chest: Effort normal and breath sounds normal.  Musculoskeletal: Normal range of motion. He exhibits edema (Mild Bilateral Edema Noted).  Neurological: He is alert and oriented to person, place, and time.  Skin: Skin is warm and dry.  Psychiatric: He has a normal mood  and affect. His behavior is normal. Judgment and thought content normal. BP 126/65   Pulse 75   Resp 16   Past Medical History:  Diagnosis Date  . Arthritis   . Gout   . Hypertension   . Stroke [facility](HCC)    left sided weakness   Social History   Social History  . Marital status: Married    Spouse name: N/A  . Number of children: N/A  . Years of education: N/A   Occupational History  . Not on file.   Social History Main Topics  . Smoking status: Current Every Day Smoker    Packs/day: 1.00    Types: Cigarettes  . Smokeless tobacco: Never Used  . Alcohol use No     Comment: previous heavy drinker now 1x month  . Drug use: No  . Sexual activity: Not on file   Other Topics Concern  . Not on file   Social History Narrative  . No narrative on file   Past Surgical History:  Procedure Laterality Date  . INGUINAL HERNIA REPAIR Right 09/21/2015   Procedure: HERNIA REPAIR INGUINAL ADULT;  Surgeon: Nadeen LandauJarvis Wilton Smith, MD;  Location: [facility] ORS;  Service: General;  Laterality: Right;   Family History  Problem Relation Age of Onset  . Kidney disease Neg Hx   . Prostate cancer Neg Hx    Allergies  Allergen Reactions  . Hydrochlorothiazide   . Lisinopril  Assessment & Plan:  Patient presents to review vascular studies. He was last seen yesterday with a chief complaint of LLE pain. The patient underwent an ABI which showed Right ABI: 0.67 and Left 0.42 (no previous for comparison). A limited left lower extremity arterial duplex is notable for a significant stenosis in the SFA. Unable to complete full duplex due to patients pain tolerance.   1. PAD (peripheral artery disease) (HCC) - Worsening Severe disease on ABI with significant narrowing in SFA on duplex. Physical exam with hard to appreciate pedal pulses. Patient with worsening LLE pain. Recommend a Left Lower Extremity Angiogram in an attempt to revascularize the extremity. Social service is patients guardian - will  need to coordinate to obtain consent.  2. Tobacco dependence - Stable I have discussed (approximately 5 minutes) with the patient the role of tobacco in the pathogenesis of atherosclerosis and its effect on the progression of the disease, impact on the durability of interventions and its limitations on the formation of collateral pathways. I have recommended absolute tobacco cessation. I have discussed various options available for assistance with tobacco cessation including over the counter methods (Nicotine gum, patch and lozenges). We also discussed prescription options (Chantix, Nicotine Inhaler / Nasal Spray). The patient is not interested in pursuing any prescription tobacco cessation options at this time. The patient voices their understanding.   Current Outpatient Prescriptions on File Prior to Visit  Medication Sig Dispense Refill  . albuterol (PROVENTIL HFA;VENTOLIN HFA) 108 (90 Base) MCG/ACT inhaler Inhale 2 puffs into the lungs every 6 (six) hours as needed for wheezing or shortness of breath. 1 Inhaler 2  . allopurinol (ZYLOPRIM) 100 MG tablet Take 100 mg by mouth daily.    Marland Kitchen amLODipine (NORVASC) 5 MG tablet Take 5 mg by mouth daily.    Mar[place] Kitchen aspirin EC 81 MG tablet Take 81 mg by mouth daily.    Mar[place] Kitchen docusate sodium (COLACE) 100 MG capsule Take 100 mg by mouth 2 (two) times daily.    Marland Kitchen HYDROcodone-acetaminophen (NORCO) 5-325 MG tablet Take 1 tablet by mouth every 4 (four) hours as needed for moderate pain. 12 tablet 0  . ipratropium (ATROVENT) 0.03 % nasal spray Place 2 sprays into both nostrils every 12 (twelve) hours.    Marland Kitchen loratadine (CLARITIN) 10 MG tablet Take 10 mg by mouth daily.    . Loratadine 10 MG CAPS Take by mouth.    . lovastatin (MEVACOR) 20 MG tablet Take 20 mg by mouth every evening.    . meloxicam (MOBIC) 7.5 MG tablet Take 7.5 mg by mouth daily as needed.     . sildenafil (REVATIO) 20 MG tablet Take 3 to 5 tablets two hours before intercouse on an empty stomach.  Do not take  with nitrates. 50 tablet 3  . tamsulosin (FLOMAX) 0.4 MG CAPS capsule Take 1 capsule (0.4 mg total) by mouth daily. 90 capsule 3   No current facility-administered medications on file prior to visit.     There are no Patient Instructions on file for this visit. No Follow-up on file.   Adyline Huberty A Layliana Devins, PA-C

## 2016-09-22 ENCOUNTER — Encounter (INDEPENDENT_AMBULATORY_CARE_PROVIDER_SITE_OTHER): Payer: Self-pay

## 2016-09-29 ENCOUNTER — Other Ambulatory Visit: Payer: Self-pay | Admitting: Vascular Surgery

## 2016-09-30 ENCOUNTER — Encounter: Payer: Self-pay | Admitting: Registered Nurse

## 2016-09-30 ENCOUNTER — Ambulatory Visit
Admission: RE | Admit: 2016-09-30 | Discharge: 2016-09-30 | Disposition: A | Discharge status: Home / Self Care | Payer: Medicare HMO | Source: Ambulatory Visit | Attending: Vascular Surgery | Admitting: Vascular Surgery

## 2016-09-30 ENCOUNTER — Encounter
Admission: RE | Disposition: A | Discharge status: Home / Self Care | Payer: Self-pay | Source: Ambulatory Visit | Attending: Vascular Surgery

## 2016-09-30 ENCOUNTER — Encounter: Payer: Self-pay | Admitting: *Deleted

## 2016-09-30 DIAGNOSIS — I70262 Atherosclerosis of native arteries of extremities with gangrene, left leg: Secondary | ICD-10-CM | POA: Diagnosis not present

## 2016-09-30 DIAGNOSIS — I96 Gangrene, not elsewhere classified: Secondary | ICD-10-CM | POA: Diagnosis not present

## 2016-09-30 DIAGNOSIS — M1990 Unspecified osteoarthritis, unspecified site: Secondary | ICD-10-CM | POA: Diagnosis not present

## 2016-09-30 DIAGNOSIS — F17210 Nicotine dependence, cigarettes, uncomplicated: Secondary | ICD-10-CM | POA: Insufficient documentation

## 2016-09-30 DIAGNOSIS — I10 Essential (primary) hypertension: Secondary | ICD-10-CM | POA: Diagnosis not present

## 2016-09-30 DIAGNOSIS — Z716 Tobacco abuse counseling: Secondary | ICD-10-CM | POA: Insufficient documentation

## 2016-09-30 DIAGNOSIS — I69354 Hemiplegia and hemiparesis following cerebral infarction affecting left non-dominant side: Secondary | ICD-10-CM | POA: Diagnosis not present

## 2016-09-30 DIAGNOSIS — Z888 Allergy status to other drugs, medicaments and biological substances status: Secondary | ICD-10-CM | POA: Insufficient documentation

## 2016-09-30 DIAGNOSIS — Z7982 Long term (current) use of aspirin: Secondary | ICD-10-CM | POA: Insufficient documentation

## 2016-09-30 DIAGNOSIS — L97929 Non-pressure chronic ulcer of unspecified part of left lower leg with unspecified severity: Secondary | ICD-10-CM | POA: Insufficient documentation

## 2016-09-30 DIAGNOSIS — M109 Gout, unspecified: Secondary | ICD-10-CM | POA: Insufficient documentation

## 2016-09-30 DIAGNOSIS — I7025 Atherosclerosis of native arteries of other extremities with ulceration: Secondary | ICD-10-CM | POA: Diagnosis present

## 2016-09-30 HISTORY — PX: LOWER EXTREMITY ANGIOGRAPHY: CATH118251

## 2016-09-30 LAB — CREATININE, SERUM
Creatinine, Ser: 1.17 mg/dL (ref 0.61–1.24)
GFR calc Af Amer: >60 mL/min (ref >60)
GFR calc non Af Amer: 57 mL/min — ABNORMAL LOW (ref >60)

## 2016-09-30 LAB — BUN: BUN: 31 mg/dL — AB (ref 6–20)

## 2016-09-30 SURGERY — LOWER EXTREMITY ANGIOGRAPHY
Anesthesia: Moderate Sedation | Laterality: Left

## 2016-09-30 MED ORDER — PHENOL 1.4 % MT LIQD
1.0000 | OROMUCOSAL | Status: DC | PRN
  Filled 2016-09-30: qty 177

## 2016-09-30 MED ORDER — FENTANYL CITRATE (PF) 100 MCG/2ML IJ SOLN
INTRAMUSCULAR | Status: AC
  Filled 2016-09-30: qty 2

## 2016-09-30 MED ORDER — LABETALOL HCL 5 MG/ML IV SOLN: 10.0000 mg | INTRAVENOUS | Status: DC | PRN

## 2016-09-30 MED ORDER — MIDAZOLAM HCL 2 MG/2ML IJ SOLN
INTRAMUSCULAR | Status: DC | PRN
  Administered 2016-09-30: 2 mg via INTRAVENOUS
  Administered 2016-09-30: 1 mg via INTRAVENOUS

## 2016-09-30 MED ORDER — CEFAZOLIN IN D5W 1 GM/50ML IV SOLN: 1.0000 g | Freq: Once | INTRAVENOUS | Status: DC

## 2016-09-30 MED ORDER — SODIUM CHLORIDE 0.9 % IV SOLN: INTRAVENOUS | Status: AC

## 2016-09-30 MED ORDER — FENTANYL CITRATE (PF) 100 MCG/2ML IJ SOLN
INTRAMUSCULAR | Status: DC | PRN
  Administered 2016-09-30 (×3): 25 ug via INTRAVENOUS

## 2016-09-30 MED ORDER — LIDOCAINE HCL (PF) 1 % IJ SOLN
INTRAMUSCULAR | Status: AC
  Filled 2016-09-30: qty 30

## 2016-09-30 MED ORDER — OXYCODONE HCL 5 MG PO TABS: 5.0000 mg | ORAL_TABLET | ORAL | Status: DC | PRN

## 2016-09-30 MED ORDER — HEPARIN SODIUM (PORCINE) 1000 UNIT/ML IJ SOLN
INTRAMUSCULAR | Status: DC | PRN
Start: 2016-09-30 — End: 2016-09-30
  Administered 2016-09-30: 5000 [IU] via INTRAVENOUS

## 2016-09-30 MED ORDER — CLOPIDOGREL BISULFATE 75 MG PO TABS: 75.0000 mg | ORAL_TABLET | Freq: Every day | ORAL | 11 refills | Status: AC

## 2016-09-30 MED ORDER — METOPROLOL TARTRATE 5 MG/5ML IV SOLN: 2.0000 mg | INTRAVENOUS | Status: DC | PRN

## 2016-09-30 MED ORDER — HYDROMORPHONE HCL 1 MG/ML IJ SOLN: 1.0000 mg | Freq: Once | INTRAMUSCULAR | Status: DC

## 2016-09-30 MED ORDER — DEXTROSE 5 % IV SOLN: 1.5000 g | Freq: Once | INTRAVENOUS | Status: DC

## 2016-09-30 MED ORDER — SODIUM CHLORIDE 0.9 % IV SOLN
INTRAVENOUS | Status: DC
  Administered 2016-09-30: 09:00:00 via INTRAVENOUS

## 2016-09-30 MED ORDER — CLOPIDOGREL BISULFATE 300 MG PO TABS: 300.0000 mg | ORAL_TABLET | ORAL | Status: DC

## 2016-09-30 MED ORDER — GUAIFENESIN-DM 100-10 MG/5ML PO SYRP: 15.0000 mL | ORAL_SOLUTION | ORAL | Status: DC | PRN

## 2016-09-30 MED ORDER — ONDANSETRON HCL 4 MG/2ML IJ SOLN: 4.0000 mg | Freq: Four times a day (QID) | INTRAMUSCULAR | Status: DC | PRN

## 2016-09-30 MED ORDER — FAMOTIDINE 20 MG PO TABS: 40.0000 mg | ORAL_TABLET | ORAL | Status: DC | PRN

## 2016-09-30 MED ORDER — SODIUM CHLORIDE 0.9 % IV SOLN: 500.0000 mL | Freq: Once | INTRAVENOUS | Status: DC | PRN

## 2016-09-30 MED ORDER — HEPARIN SODIUM (PORCINE) 1000 UNIT/ML IJ SOLN
INTRAMUSCULAR | Status: AC
  Filled 2016-09-30: qty 1

## 2016-09-30 MED ORDER — METHYLPREDNISOLONE SODIUM SUCC 125 MG IJ SOLR: 125.0000 mg | INTRAMUSCULAR | Status: DC | PRN

## 2016-09-30 MED ORDER — HYDRALAZINE HCL 20 MG/ML IJ SOLN: 5.0000 mg | INTRAMUSCULAR | Status: DC | PRN

## 2016-09-30 MED ORDER — SODIUM CHLORIDE 0.9 % IV SOLN
Freq: Once | INTRAVENOUS | Status: AC
Start: 2016-09-30 — End: 2016-09-30
  Administered 2016-09-30: 09:00:00 via INTRAVENOUS

## 2016-09-30 MED ORDER — HYDROMORPHONE HCL 1 MG/ML IJ SOLN
INTRAMUSCULAR | Status: AC
  Administered 2016-09-30: 0.5 mg
  Filled 2016-09-30: qty 1

## 2016-09-30 MED ORDER — HYDROMORPHONE HCL 1 MG/ML IJ SOLN: 0.5000 mg | INTRAMUSCULAR | Status: DC | PRN

## 2016-09-30 MED ORDER — HEPARIN (PORCINE) IN NACL 2-0.9 UNIT/ML-% IJ SOLN
INTRAMUSCULAR | Status: AC
  Filled 2016-09-30: qty 1000

## 2016-09-30 MED ORDER — MIDAZOLAM HCL 5 MG/5ML IJ SOLN
INTRAMUSCULAR | Status: AC
  Filled 2016-09-30: qty 5

## 2016-09-30 SURGICAL SUPPLY — 33 items
BALLN LUTONIX DCB 4X60X130 (BALLOONS) ×2
BALLN LUTONIX DCB 5X40X130 (BALLOONS) ×2
BALLN ULTRV 018 3X40X75 (BALLOONS) ×2
BALLOON LUTONIX DCB 4X60X130 (BALLOONS) ×1 IMPLANT
BALLOON LUTONIX DCB 5X40X130 (BALLOONS) ×1 IMPLANT
BALLOON ULTRV 018 3X40X75 (BALLOONS) ×1 IMPLANT
CATH 5FR PIG 90CM (CATHETERS) ×2 IMPLANT
CATH 5FR REUT (CATHETERS) ×2 IMPLANT
CATH ANGIO 5F 80CM MHK1-H (CATHETERS) ×2 IMPLANT
CATH BEACON 5.038 65CM KMP-01 (CATHETERS) ×2 IMPLANT
CATH CROSS S6 106CM (CATHETERS) ×2 IMPLANT
CATH RIM 65CM (CATHETERS) ×2 IMPLANT
CATH USHER TPER 83CM (CATHETERS) ×2 IMPLANT
DEVICE CLOSURE MYNXGRIP 6/7F (Vascular Products) ×2 IMPLANT
DEVICE PRESTO INFLATION (MISCELLANEOUS) ×2 IMPLANT
DEVICE SAFEGUARD 24CM (GAUZE/BANDAGES/DRESSINGS) ×2 IMPLANT
DEVICE STARCLOSE SE CLOSURE (Vascular Products) ×2 IMPLANT
DEVICE TORQUE (MISCELLANEOUS) ×2 IMPLANT
GLIDECATH 4FR STR (CATHETERS) ×2 IMPLANT
GLIDEWIRE ANGLED SS 035X260CM (WIRE) ×2 IMPLANT
GUIDEWIRE ADV .018X180CM (WIRE) ×2 IMPLANT
KIT FLOWMATE PROCEDURAL (MISCELLANEOUS) ×2 IMPLANT
NEEDLE ENTRY 21GA 7CM ECHOTIP (NEEDLE) ×2 IMPLANT
PACK ANGIOGRAPHY (CUSTOM PROCEDURE TRAY) ×2 IMPLANT
SET INTRO CAPELLA COAXIAL (SET/KITS/TRAYS/PACK) ×2 IMPLANT
SHEATH BALKIN 6FR (SHEATH) ×2 IMPLANT
SHEATH BRITE TIP 5FRX11 (SHEATH) ×2 IMPLANT
SHEATH BRITE TIP 6FRX11 (SHEATH) ×2 IMPLANT
SHIELD X-DRAPE GOLD 12X17 (MISCELLANEOUS) ×2 IMPLANT
TOWEL OR 17X26 4PK STRL BLUE (TOWEL DISPOSABLE) ×2 IMPLANT
TUBING CONTRAST HIGH PRESS 72 (TUBING) ×2 IMPLANT
WIRE G 018X200 V18 (WIRE) ×4 IMPLANT
WIRE J 3MM .035X145CM (WIRE) ×2 IMPLANT

## 2016-09-30 NOTE — Op Note (Signed)
[facility] Percutaneous Study/Intervention Procedural Note   Date of Surgery: 09/30/2016  Surgeon:  Katha Cabal, MD.  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 Introduction catheter into left lower extremity 3rd order catheter placement  2. Contrast injection left lower extremity for distal runoff   3. Crosser atherectomy of the left tibioperoneal trunk artery 4. Percutaneous transluminal angioplasty left posterior tibial and left tibioperoneal trunk artery with a 3 mm all traverse balloon and then a 4 mm Lutonix drug-eluting balloon             5.  Percutaneous transluminal angioplasty of the left SFA and popliteal arteries with a 4 mm Lutonix drug eluting balloon                      6.   Star close closure right common femoral arteriotomy             7.   Minx closure left common femoral artery                Anesthesia: Conscious sedation was administered by the interventional radiology RN under my direct supervision. IV Versed plus fentanyl were utilized. Continuous ECG, pulse oximetry and blood pressure was monitored throughout the entire procedure. Conscious sedation was for a total of 125 minutes.  Sheath: 6 [place] Balkan right common femoral artery retrograde; 6 [place] Pinnacle left common femoral artery antegrade  Contrast: 90 cc  Fluoroscopy Time: 21.3 minutes  Indications: [name] Salton Sr. presents with atherosclerotic occlusive disease bilateral lower extremities. The patient has developed ulceration and gangrene of the soft tissues of the left lower extremity. This places the patient at high risk for limb loss and amputation. The risks and benefits are reviewed all questions answered patient agrees to proceed.  Procedure:  [name] Saks Sr. is a 83 y.o. y.o. male who was identified and appropriate procedural time out was performed. The patient was then placed supine on the table and prepped and draped in the usual sterile fashion.   Ultrasound was placed in the sterile sleeve and the right groin was evaluated the right common femoral artery was echolucent and pulsatile indicating patency.  Image was recorded for the permanent record and under real-time visualization a microneedle was inserted into the common femoral artery microwire followed by a micro-sheath.  A J-wire was then advanced through the micro-sheath and a  5 [place] sheath was then inserted over a J-wire. J-wire was then advanced and a 5 French pigtail catheter was positioned at the level of T12. AP projection of the aorta was then obtained. Pigtail catheter was repositioned to above the bifurcation and a RAO view of the pelvis was obtained.  Subsequently a pigtail catheter, rim catheter and also AV S1 catheter with the stiff angle Glidewire was used in an attempt to cross the aortic bifurcation.  At one point a Balkan sheath was advanced up in order to try to secure better purchase but this was also unsuccessful given the extreme tortuosity.  I therefore elected to perform an antegrade stick on the left side given that I had already visualized the aorta and the iliac system and there were no hemodynamically significant lesions. I therefore performed a RAO view of the right groin and a Star close device was deployed successfully upon removal of the Balkan sheath. Attention was then turned back to  the left groin with the ultrasound was returned to a sterile sleeve. Under fluoroscopic and ultrasound guidance the proximal portion of the common femoral was identified the artery was noted to be echolucent and pulsatile indicating patency by ultrasound. An image was recorded for the permanent record and under real-time visualization a microneedle was inserted microwire was  negotiated into the SFA followed by the micro-sheath. J-wire was then advanced followed by a 6 [place] Pinnacle sheath. Oblique view of the femoral bifurcation was then obtained and subsequently the wire was reintroduced and the pigtail catheter negotiated into the SFA representing third order catheter placement. Distal runoff was then performed.  5000 units of heparin was then given and allowed to circulate.  Wire and catheter were then used to negotiate the SFA stenosis and the catheter was then positioned in the distal popliteal where magnified imaging of the trifurcation was performed.  A 4 x 6 Lutonix drug-eluting balloon was used to angioplasty the superficial femoral and proximal popliteal arteries. Inflation was to 12 atmospheres for 2 minutes. Follow-up imaging demonstrated patency with less than 5% residual stenosis.  Distal runoff was then reassessed.  The S6 Crosser catheter was then prepped on the field and a straight usher catheter was advanced into the cul-de-sac of the tibioperoneal trunk under magnified imaging. Using the Crosser catheter the occlusion of the tibioperoneal trunk was negotiated.  Injection of contrast confirmed intraluminal positioning.  Usher catheter and stiff angle Glidewire were then advanced down into the distal tibial peroneal trunk.    A 3 x 10 all traverse balloon was then used to angioplasty the tibioperoneal trunk and subsequently the posterior tibial artery after the wire had been negotiated down into the PT. Inflations were to 10-12 atm for 1 full minute. Follow-up imaging demonstrated patency of the posterior tibial tibial artery but the tibioperoneal trunk had significant residual stenosis and therefore a 4 mm x 6 cm Lutonix drug-eluting balloon was advanced across this lesion and plasty was performed to 12 atm for 2 minutes.  After review of these images the sheath is pulled into the left common femoral and a LAO oblique of the left common femoral is  obtained and a minx close device deployed. There no immediate Complications.  Findings: The abdominal aorta is opacified with a bolus injection contrast. Renal arteries are single and patent bilaterally. The aorta itself has diffuse disease but no hemodynamically significant lesions it demonstrates profound tortuosity. The common and external iliac arteries are widely patent bilaterally but again demonstrated profound tortuosity with exceptional redundancy.  The left common femoral is widely patent as is the profunda femoris.  The SFA does indeed have a significant stenosis at Hunter's canal which extends into the popliteal.  The distal popliteal demonstrates increasing disease and the trifurcation is heavily diseased with occlusion of the tibioperoneal trunk and origins of the peroneal and posterior tibial.   Following angioplasty posterior tibial and the tibioperoneal trunk  are now widely patent with in-line flow and looks quite nice with less than 5% residual stenosis. Angioplasty of the SFA and popliteal with a Lutonix balloon shows an excellent result with less than 5% residual stenosis.   Disposition: Patient was taken to the recovery room in stable condition having tolerated the procedure well.  [name] Apt 09/30/2016,11:15 AM

## 2016-09-30 NOTE — H&P (Signed)
[facility] History & Physical Update  The patient was interviewed and re-examined.  The patient's previous History and Physical has been reviewed and is unchanged.  There is no change in the plan of care. We plan to proceed with the scheduled procedure.  Levora DredgeGregory Brelyn Woehl, MD  09/30/2016, 8:38 AM

## 2016-10-01 ENCOUNTER — Encounter: Payer: Self-pay | Admitting: Vascular Surgery

## 2016-10-15 ENCOUNTER — Encounter (INDEPENDENT_AMBULATORY_CARE_PROVIDER_SITE_OTHER): Payer: Self-pay | Admitting: Vascular Surgery

## 2016-10-15 ENCOUNTER — Ambulatory Visit (INDEPENDENT_AMBULATORY_CARE_PROVIDER_SITE_OTHER): Payer: Medicare HMO | Admitting: Vascular Surgery

## 2016-10-15 VITALS — BP 139/82 | HR 73 | Resp 16 | Wt 125.0 lb

## 2016-10-15 DIAGNOSIS — E785 Hyperlipidemia, unspecified: Secondary | ICD-10-CM

## 2016-10-15 DIAGNOSIS — I739 Peripheral vascular disease, unspecified: Secondary | ICD-10-CM

## 2016-10-15 DIAGNOSIS — F17200 Nicotine dependence, unspecified, uncomplicated: Secondary | ICD-10-CM | POA: Diagnosis not present

## 2016-11-16 ENCOUNTER — Emergency Department: Payer: Medicare HMO

## 2016-11-16 ENCOUNTER — Observation Stay
Admission: EM | Admit: 2016-11-16 | Discharge: 2016-11-18 | Disposition: A | Discharge status: Home / Self Care | Payer: Medicare HMO | Attending: Internal Medicine | Admitting: Internal Medicine

## 2016-11-16 ENCOUNTER — Encounter: Payer: Self-pay | Admitting: Emergency Medicine

## 2016-11-16 DIAGNOSIS — Z993 Dependence on wheelchair: Secondary | ICD-10-CM | POA: Diagnosis not present

## 2016-11-16 DIAGNOSIS — W050XXA Fall from non-moving wheelchair, initial encounter: Secondary | ICD-10-CM | POA: Diagnosis not present

## 2016-11-16 DIAGNOSIS — I119 Hypertensive heart disease without heart failure: Secondary | ICD-10-CM | POA: Diagnosis not present

## 2016-11-16 DIAGNOSIS — I69354 Hemiplegia and hemiparesis following cerebral infarction affecting left non-dominant side: Secondary | ICD-10-CM | POA: Diagnosis not present

## 2016-11-16 DIAGNOSIS — R270 Ataxia, unspecified: Secondary | ICD-10-CM

## 2016-11-16 DIAGNOSIS — I671 Cerebral aneurysm, nonruptured: Secondary | ICD-10-CM | POA: Insufficient documentation

## 2016-11-16 DIAGNOSIS — F17210 Nicotine dependence, cigarettes, uncomplicated: Secondary | ICD-10-CM | POA: Diagnosis not present

## 2016-11-16 DIAGNOSIS — J479 Bronchiectasis, uncomplicated: Secondary | ICD-10-CM | POA: Insufficient documentation

## 2016-11-16 DIAGNOSIS — R42 Dizziness and giddiness: Secondary | ICD-10-CM | POA: Diagnosis not present

## 2016-11-16 DIAGNOSIS — S72142A Displaced intertrochanteric fracture of left femur, initial encounter for closed fracture: Secondary | ICD-10-CM | POA: Insufficient documentation

## 2016-11-16 DIAGNOSIS — N400 Enlarged prostate without lower urinary tract symptoms: Secondary | ICD-10-CM | POA: Diagnosis not present

## 2016-11-16 DIAGNOSIS — R531 Weakness: Secondary | ICD-10-CM

## 2016-11-16 DIAGNOSIS — J439 Emphysema, unspecified: Secondary | ICD-10-CM | POA: Diagnosis not present

## 2016-11-16 DIAGNOSIS — Z7902 Long term (current) use of antithrombotics/antiplatelets: Secondary | ICD-10-CM | POA: Diagnosis not present

## 2016-11-16 DIAGNOSIS — R2689 Other abnormalities of gait and mobility: Secondary | ICD-10-CM | POA: Insufficient documentation

## 2016-11-16 DIAGNOSIS — Z888 Allergy status to other drugs, medicaments and biological substances status: Secondary | ICD-10-CM | POA: Insufficient documentation

## 2016-11-16 DIAGNOSIS — M47812 Spondylosis without myelopathy or radiculopathy, cervical region: Secondary | ICD-10-CM | POA: Insufficient documentation

## 2016-11-16 DIAGNOSIS — M1712 Unilateral primary osteoarthritis, left knee: Secondary | ICD-10-CM | POA: Insufficient documentation

## 2016-11-16 DIAGNOSIS — Z7982 Long term (current) use of aspirin: Secondary | ICD-10-CM | POA: Diagnosis not present

## 2016-11-16 DIAGNOSIS — J320 Chronic maxillary sinusitis: Secondary | ICD-10-CM | POA: Diagnosis not present

## 2016-11-16 DIAGNOSIS — I639 Cerebral infarction, unspecified: Secondary | ICD-10-CM

## 2016-11-16 DIAGNOSIS — I739 Peripheral vascular disease, unspecified: Secondary | ICD-10-CM | POA: Insufficient documentation

## 2016-11-16 DIAGNOSIS — M4802 Spinal stenosis, cervical region: Secondary | ICD-10-CM | POA: Diagnosis not present

## 2016-11-16 DIAGNOSIS — L97528 Non-pressure chronic ulcer of other part of left foot with other specified severity: Secondary | ICD-10-CM | POA: Insufficient documentation

## 2016-11-16 DIAGNOSIS — I6523 Occlusion and stenosis of bilateral carotid arteries: Secondary | ICD-10-CM | POA: Diagnosis not present

## 2016-11-16 DIAGNOSIS — M109 Gout, unspecified: Secondary | ICD-10-CM | POA: Diagnosis not present

## 2016-11-16 DIAGNOSIS — R2681 Unsteadiness on feet: Secondary | ICD-10-CM | POA: Diagnosis present

## 2016-11-16 DIAGNOSIS — I517 Cardiomegaly: Secondary | ICD-10-CM | POA: Diagnosis not present

## 2016-11-16 DIAGNOSIS — W19XXXA Unspecified fall, initial encounter: Secondary | ICD-10-CM

## 2016-11-16 DIAGNOSIS — Z9889 Other specified postprocedural states: Secondary | ICD-10-CM | POA: Insufficient documentation

## 2016-11-16 DIAGNOSIS — M1612 Unilateral primary osteoarthritis, left hip: Secondary | ICD-10-CM | POA: Insufficient documentation

## 2016-11-16 DIAGNOSIS — Z79899 Other long term (current) drug therapy: Secondary | ICD-10-CM | POA: Insufficient documentation

## 2016-11-16 DIAGNOSIS — Z7401 Bed confinement status: Secondary | ICD-10-CM | POA: Diagnosis not present

## 2016-11-16 LAB — COMPREHENSIVE METABOLIC PANEL
ALBUMIN: 3.4 g/dL — AB (ref 3.5–5.0)
ALK PHOS: 89 U/L (ref 38–126)
ALT: 20 U/L (ref 17–63)
ANION GAP: 8 (ref 5–15)
AST: 36 U/L (ref 15–41)
BUN: 21 mg/dL — AB (ref 6–20)
CALCIUM: 9.4 mg/dL (ref 8.9–10.3)
CO2: 25 mmol/L (ref 22–32)
Chloride: 108 mmol/L (ref 101–111)
Creatinine, Ser: 1.18 mg/dL (ref 0.61–1.24)
GFR calc Af Amer: >60 mL/min (ref >60)
GFR calc non Af Amer: 56 mL/min — ABNORMAL LOW (ref >60)
GLUCOSE: 93 mg/dL (ref 65–99)
Potassium: 3.8 mmol/L (ref 3.5–5.1)
Sodium: 141 mmol/L (ref 135–145)
TOTAL PROTEIN: 7.8 g/dL (ref 6.5–8.1)
Total Bilirubin: 0.4 mg/dL (ref 0.3–1.2)

## 2016-11-16 LAB — CBC WITH DIFFERENTIAL/PLATELET
BASOS ABS: 0 10*3/uL (ref 0–0.1)
BASOS PCT: 1 %
EOS ABS: 0.7 10*3/uL (ref 0–0.7)
Eosinophils Relative: 9 %
HEMATOCRIT: 35.4 % — AB (ref 40.0–52.0)
HEMOGLOBIN: 12 g/dL — AB (ref 13.0–18.0)
Lymphocytes Relative: 21 %
Lymphs Abs: 1.5 10*3/uL (ref 1.0–3.6)
MCH: 30.6 pg (ref 26.0–34.0)
MCHC: 33.9 g/dL (ref 32.0–36.0)
MCV: 90.3 fL (ref 80.0–100.0)
MONOS PCT: 17 %
Monocytes Absolute: 1.2 10*3/uL — ABNORMAL HIGH (ref 0.2–1.0)
NEUTROS ABS: 3.7 10*3/uL (ref 1.4–6.5)
NEUTROS PCT: 52 %
Platelets: 291 10*3/uL (ref 150–440)
RBC: 3.92 MIL/uL — AB (ref 4.40–5.90)
RDW: 14.3 % (ref 11.5–14.5)
WBC: 7.1 10*3/uL (ref 3.8–10.6)

## 2016-11-16 LAB — TROPONIN I

## 2016-11-16 LAB — URINALYSIS, COMPLETE (UACMP) WITH MICROSCOPIC
BACTERIA UA: NONE SEEN
BILIRUBIN URINE: NEGATIVE
Glucose, UA: NEGATIVE mg/dL
Hgb urine dipstick: NEGATIVE
KETONES UR: NEGATIVE mg/dL
LEUKOCYTES UA: NEGATIVE
Nitrite: NEGATIVE
PH: 5 (ref 5.0–8.0)
Protein, ur: NEGATIVE mg/dL
SPECIFIC GRAVITY, URINE: 1.008 (ref 1.005–1.030)

## 2016-11-16 LAB — AMMONIA

## 2016-11-16 LAB — TSH: TSH: 0.454 u[IU]/mL (ref 0.350–4.500)

## 2016-11-16 MED ORDER — IOPAMIDOL (ISOVUE-370) INJECTION 76%
75.0000 mL | Freq: Once | INTRAVENOUS | Status: AC | PRN
  Administered 2016-11-16: 75 mL via INTRAVENOUS

## 2016-11-16 MED ORDER — SODIUM CHLORIDE 0.9 % IV BOLUS (SEPSIS)
1000.0000 mL | Freq: Once | INTRAVENOUS | Status: AC
  Administered 2016-11-16: 1000 mL via INTRAVENOUS

## 2016-11-16 NOTE — ED Notes (Signed)
Patient transported to CT 

## 2016-11-16 NOTE — ED Provider Notes (Signed)
[facility] Emergency Department Provider Note  ____________________________________________   First MD Initiated Contact with Patient 11/16/16 2055     (approximate)  I have reviewed the triage vital signs and the nursing notes.   HISTORY  Chief Complaint Weakness and Fall   HPI [name] Cervi Sr. is a 83 y.o. male with a history of hypertension as well as stroke who is presenting to the emergency department today with "feeling drunk" as well as 2 falls today. He says he is wheelchair bound and fell from his wheelchair while going downhill today. He says that he feels that the dizzy. Says that he is also had knee pain over the past 3 months which is unchanged. He was found to have a blood sugar greater than 100 on scene. Is not complaining of any pain except for his left knee which he says is chronic.   Past Medical History:  Diagnosis Date  . Arthritis   . Gout   . Hypertension   . Stroke [facility])    left sided weakness    Patient Active Problem List   Diagnosis Date Noted  . Lower extremity pain, left 09/17/2016  . PAD (peripheral artery disease) (HCC) 09/17/2016  . Tobacco dependence 09/17/2016    Past Surgical History:  Procedure Laterality Date  . INGUINAL HERNIA REPAIR Right 09/21/2015   Procedure: HERNIA REPAIR INGUINAL ADULT;  Surgeon: [name], MD;  Location: [facility] ORS;  Service: General;  Laterality: Right;  . LOWER EXTREMITY ANGIOGRAPHY Left 09/30/2016   Procedure: Lower Extremity Angiography;  Surgeon: [name], MD;  Location: [facility] INVASIVE CV LAB;  Service: Cardiovascular;  Laterality: Left;    Prior to Admission medications   Medication Sig Start Date End Date Taking? Authorizing Provider  acetaminophen (TYLENOL) 325 MG tablet Take 650 mg by mouth every 6 (six) hours as needed for moderate pain.    [provider]  albuterol (PROVENTIL HFA;VENTOLIN HFA) 108 (90 Base) MCG/ACT inhaler Inhale 2 puffs into the  lungs every 6 (six) hours as needed for wheezing or shortness of breath. 02/18/16   Minna Antis, MD  amLODipine (NORVASC) 5 MG tablet Take 5 mg by mouth daily.    [provider]  aspirin EC 81 MG tablet Take 81 mg by mouth daily.    [provider]  clopidogrel (PLAVIX) 75 MG tablet Take 1 tablet (75 mg total) by mouth daily. 10/01/16   Schnier, Latina Craver, MD  docusate sodium (COLACE) 100 MG capsule Take 100 mg by mouth 2 (two) times daily.    [provider]  febuxostat (ULORIC) 40 MG tablet Take 40 mg by mouth daily.    [provider]  hypromellose (GENTEAL SEVERE) 0.3 % GEL ophthalmic ointment Place 1 application into the left eye as needed (irritation/watering).     [provider]  ipratropium (ATROVENT) 0.03 % nasal spray Place 1 spray into both nostrils every 4 (four) hours as needed (congestion).     [provider]  loratadine (CLARITIN) 10 MG tablet Take 10 mg by mouth every evening.     [provider]  lovastatin (MEVACOR) 20 MG tablet Take 20 mg by mouth every evening.    [provider]  meloxicam (MOBIC) 7.5 MG tablet Take 7.5 mg by mouth every evening.     [provider]  tamsulosin (FLOMAX) 0.4 MG CAPS capsule Take 1 capsule (0.4 mg total) by mouth daily. 03/14/16   Harle Battiest, PA-C  Allergies Hydrochlorothiazide and Lisinopril  Family History  Problem Relation Age of Onset  . Kidney disease Neg Hx   . Prostate cancer Neg Hx     Social History Social History  Substance Use Topics  . Smoking status: Current Every Day Smoker    Packs/day: 1.00    Years: 50.00    Types: Cigarettes  . Smokeless tobacco: Never Used  . Alcohol use No     Comment: previous heavy drinker now 1x month    Review of Systems  Constitutional: No fever/chills Eyes: No visual changes. ENT: No sore throat. Cardiovascular: Denies chest pain. Respiratory: Denies shortness of  breath. Gastrointestinal: No abdominal pain.  No nausea, no vomiting.  No diarrhea.  No constipation. Genitourinary: Negative for dysuria. Musculoskeletal: Negative for back pain. Skin: Negative for rash. Neurological: Negative for headaches, focal weakness or numbness.   ____________________________________________   PHYSICAL EXAM:  VITAL SIGNS: ED Triage Vitals [11/16/16 2054]  Enc Vitals Group     BP 129/81     Pulse Rate 95     Resp 18     Temp 98.1 F (36.7 C)     Temp Source Oral     SpO2 98 %     Weight      Height      Head Circumference      Peak Flow      Pain Score      Pain Loc      Pain Edu?      Excl. in GC?     Constitutional: Alert and oriented. Well appearing and in no acute distress. Eyes: Conjunctivae are normal. PERRL. EOMI. Head: Atraumatic. Nose: No congestion/rhinnorhea. Mouth/Throat: Mucous membranes are moist.   Neck: No stridor.   Cardiovascular: Normal rate, regular rhythm. Grossly normal heart sounds.  Good peripheral circulation With intact dorsalis pedis pulses to the bilateral feet. Respiratory: Normal respiratory effort.  No retractions. Lungs CTAB. Gastrointestinal: Soft and nontender. No distention.  Musculoskeletal: No lower extremity tenderness.  No joint effusions.  Moderate lower extremity edema to the left side. Mild to the right. Knee is flexed and fixed to the left and I'm unable to extend it without the patient having pain. Neurologic:  Normal speech and language. No gross focal neurologic deficits are appreciated.  Ataxia to the bilateral upper extremities. No nystagmus. Skin:  Skin is warm, dry and intact. No rash noted. Psychiatric: Mood and affect are normal. Speech and behavior are normal.  ____________________________________________   LABS (all labs ordered are listed, but only abnormal results are displayed)  Labs Reviewed  CBC WITH DIFFERENTIAL/PLATELET - Abnormal; Notable for the following:       Result Value    RBC 3.92 (*)    Hemoglobin 12.0 (*)    HCT 35.4 (*)    Monocytes Absolute 1.2 (*)    All other components within normal limits  COMPREHENSIVE METABOLIC PANEL - Abnormal; Notable for the following:    BUN 21 (*)    Albumin 3.4 (*)    GFR calc non Af Amer 56 (*)    All other components within normal limits  URINALYSIS, COMPLETE (UACMP) WITH MICROSCOPIC - Abnormal; Notable for the following:    Color, Urine STRAW (*)    APPearance CLEAR (*)    Squamous Epithelial / LPF 0-5 (*)    All other components within normal limits  AMMONIA - Abnormal; Notable for the following:    Ammonia <9 (*)    All other components within normal  limits  TROPONIN I  TSH   ____________________________________________  EKG  ED ECG REPORT I, Arelia Longest, the attending physician, personally viewed and interpreted this ECG.   Date: 11/16/2016  EKG Time: 2055  Rate: 91  Rhythm: normal sinus rhythm  Axis: normal  Intervals:IRBBB and LAFB  ST&T Change: No ST segment elevation or depression. No abnormal T-wave inversion. Very poor baseline.  ____________________________________________  RADIOLOGY  CT Angio Neck W and/or Wo Contrast (In process)  Result time 11/16/16 23:37:22    CT ANGIO HEAD W OR WO CONTRAST (In process)  Result time 11/16/16 23:37:39    DG Femur Min 2 Views Left (Final result)  Result time 11/16/16 22:59:13  Final result by Janice Coffin, MD (11/16/16 22:59:13)           Narrative:   CLINICAL DATA: Pain after fall today  EXAM: LEFT FEMUR 2 VIEWS  COMPARISON: None.  FINDINGS: The knee is flexed on all the images limiting assessment. There is femorotibial joint space narrowing. Likewise there is joint space narrowing of the left hip. No acute displaced fracture nor dislocation is noted. Vascular calcifications are present.  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 No acute fracture nor dislocations.   Electronically Signed By: Tollie Eth M.D. On: 11/16/2016 22:59            CT Head Wo Contrast (Final result)  Result time 11/16/16 22:11:10  Final result by Rubye Oaks, MD (11/16/16 22:11:10)           Narrative:   CLINICAL DATA: Dizziness. Weakness since this morning. Fall out of wheelchair.  EXAM: CT HEAD WITHOUT CONTRAST  TECHNIQUE: Contiguous axial images were obtained from the base of the skull through the vertex without intravenous contrast.  COMPARISON: Head CT and brain MRI 01/03/2014  FINDINGS: Brain: Similar degree of atrophy and chronic small vessel ischemia. Remote lacunar infarct in the right basal ganglia. No evidence of large vessel ischemia, mass effect, or midline shift. No hydrocephalus. No extra-axial or subdural fluid collection.  Vascular: Atherosclerosis of skullbase vasculature without hyperdense vessel or abnormal calcification.  Skull: No fracture or focal lesion.  Sinuses/Orbits: Chronic opacification of right mastoid air cells. Left mastoid air cells are well-aerated. Mucosal thickening in the left maxillary sinus with minimal debris. Remote left nasal bone fracture. Visualized orbits are unremarkable.  Other: None.  IMPRESSION: Similar atrophy, chronic small vessel ischemia and remote lacunar infarct in the right basal ganglia. No acute intracranial abnormality.  Chronic opacification of right mastoid air cells. Minimal left maxillary sinus disease, possibly acute with intraluminal debris.   Electronically Signed By: Rubye Oaks M.D. On: 11/16/2016 22:11            DG Chest 1 View (Final result)  Result time 11/16/16 21:42:04  Final result by Harmon Pier, MD (11/16/16 21:42:04)           Narrative:   CLINICAL DATA: Dizziness and fall today.  EXAM: CHEST 1 VIEW  COMPARISON: 02/18/2016 and prior radiographs  FINDINGS: Cardiomegaly again noted.  There is no evidence of focal airspace disease, pulmonary  edema, suspicious pulmonary nodule/mass, pleural effusion, or pneumothorax.  No acute bony abnormalities are identified.  IMPRESSION: No evidence of acute cardiopulmonary disease.   Electronically Signed By: Harmon Pier M.D. On: 11/16/2016 21:42            DG Pelvis 1-2 Views (Final result)  Result time 11/16/16 21:46:07  Final result by Harmon Pier, MD (11/16/16 21:46:07)  Narrative:   CLINICAL DATA: Fall with pelvic pain.  EXAM: PELVIS - 1-2 VIEW  COMPARISON: None.  FINDINGS: An intratrochanteric left femur fracture appears remote but correlate clinically.  No other fracture, subluxation or dislocation identified.  Degenerative changes in the lower lumbar spine and hips are noted.  IMPRESSION: Intertrochanteric left femur fracture which appears remote but correlate clinically. Consider dedicated left hip films as indicated.   Electronically Signed By: Harmon Pier M.D. On: 11/16/2016 21:46          ____________________________________________   PROCEDURES  Procedure(s) performed:   Procedures  Critical Care performed:   ____________________________________________   INITIAL IMPRESSION / ASSESSMENT AND PLAN / ED COURSE  Pertinent labs & imaging results that were available during my care of the patient were reviewed by me and considered in my medical decision making (see chart for details).  ----------------------------------------- 11:46 PM on 11/16/2016 -----------------------------------------  Patient says he is feeling improved after fluids. So further workup is been very reassuring. Patient also says that his ataxia on his upper extremities is intermittent. We will obtain a CT angiography to make sure there is no basilar insufficiency or any other sign of acute infarction. Signed out to Dr. Zenda Alpers.      ____________________________________________   FINAL CLINICAL IMPRESSION(S) / ED DIAGNOSES  Final  diagnoses:  Ataxia   Fall.   NEW MEDICATIONS STARTED DURING THIS VISIT:  New Prescriptions   No medications on file     Note:  This document was prepared using Dragon voice recognition software and may include unintentional dictation errors.    Myrna Blazer, MD 11/16/16 [id number]

## 2016-11-16 NOTE — ED Triage Notes (Signed)
Patient brought in by [facility]. Patient with complaint of weakness that started this morning. Patient fell out of wheelchair times 2 today. Denies hitting head.

## 2016-11-17 ENCOUNTER — Encounter: Payer: Self-pay | Admitting: Internal Medicine

## 2016-11-17 ENCOUNTER — Observation Stay: Payer: Medicare HMO

## 2016-11-17 DIAGNOSIS — R42 Dizziness and giddiness: Secondary | ICD-10-CM | POA: Diagnosis not present

## 2016-11-17 DIAGNOSIS — I708 Atherosclerosis of other arteries: Secondary | ICD-10-CM

## 2016-11-17 DIAGNOSIS — R2681 Unsteadiness on feet: Secondary | ICD-10-CM | POA: Diagnosis present

## 2016-11-17 DIAGNOSIS — R270 Ataxia, unspecified: Secondary | ICD-10-CM

## 2016-11-17 DIAGNOSIS — I6523 Occlusion and stenosis of bilateral carotid arteries: Secondary | ICD-10-CM | POA: Diagnosis not present

## 2016-11-17 LAB — MRSA PCR SCREENING: MRSA BY PCR: NEGATIVE

## 2016-11-17 MED ORDER — ENSURE ENLIVE PO LIQD
237.0000 mL | Freq: Three times a day (TID) | ORAL | Status: DC
  Administered 2016-11-17 – 2016-11-18 (×3): 237 mL via ORAL

## 2016-11-17 MED ORDER — IPRATROPIUM BROMIDE 0.03 % NA SOLN
1.0000 | NASAL | Status: DC | PRN
  Filled 2016-11-17: qty 30

## 2016-11-17 MED ORDER — ACETAMINOPHEN 325 MG PO TABS: 650.0000 mg | ORAL_TABLET | Freq: Four times a day (QID) | ORAL | Status: DC | PRN

## 2016-11-17 MED ORDER — TAMSULOSIN HCL 0.4 MG PO CAPS
0.4000 mg | ORAL_CAPSULE | Freq: Every day | ORAL | Status: DC
  Administered 2016-11-17 – 2016-11-18 (×2): 0.4 mg via ORAL
  Filled 2016-11-17 (×2): qty 1

## 2016-11-17 MED ORDER — SENNOSIDES-DOCUSATE SODIUM 8.6-50 MG PO TABS
1.0000 | ORAL_TABLET | Freq: Every evening | ORAL | Status: DC | PRN
  Filled 2016-11-17: qty 1

## 2016-11-17 MED ORDER — AMLODIPINE BESYLATE 5 MG PO TABS
5.0000 mg | ORAL_TABLET | Freq: Every day | ORAL | Status: DC
  Administered 2016-11-17: 11:00:00 5 mg via ORAL
  Filled 2016-11-17: qty 1

## 2016-11-17 MED ORDER — ALBUTEROL SULFATE (2.5 MG/3ML) 0.083% IN NEBU: 2.5000 mg | INHALATION_SOLUTION | Freq: Four times a day (QID) | RESPIRATORY_TRACT | Status: DC | PRN

## 2016-11-17 MED ORDER — FEBUXOSTAT 40 MG PO TABS
40.0000 mg | ORAL_TABLET | Freq: Every day | ORAL | Status: DC
  Administered 2016-11-17 – 2016-11-18 (×2): 40 mg via ORAL
  Filled 2016-11-17 (×2): qty 1

## 2016-11-17 MED ORDER — ASPIRIN EC 81 MG PO TBEC
81.0000 mg | DELAYED_RELEASE_TABLET | Freq: Every day | ORAL | Status: DC
  Administered 2016-11-17 – 2016-11-18 (×2): 81 mg via ORAL
  Filled 2016-11-17 (×3): qty 1

## 2016-11-17 MED ORDER — POLYETHYL GLYCOL-PROPYL GLYCOL 0.4-0.3 % OP GEL
1.0000 "application " | OPHTHALMIC | Status: DC | PRN
  Filled 2016-11-17: qty 10

## 2016-11-17 MED ORDER — SODIUM CHLORIDE 0.9% FLUSH: 3.0000 mL | INTRAVENOUS | Status: DC | PRN

## 2016-11-17 MED ORDER — SODIUM CHLORIDE 0.9 % IV SOLN: 250.0000 mL | INTRAVENOUS | Status: DC | PRN

## 2016-11-17 MED ORDER — ENOXAPARIN SODIUM 40 MG/0.4ML ~~LOC~~ SOLN
40.0000 mg | SUBCUTANEOUS | Status: DC
  Administered 2016-11-17 – 2016-11-18 (×2): 40 mg via SUBCUTANEOUS
  Filled 2016-11-17 (×2): qty 0.4

## 2016-11-17 MED ORDER — ACETAMINOPHEN 650 MG RE SUPP: 650.0000 mg | Freq: Four times a day (QID) | RECTAL | Status: DC | PRN

## 2016-11-17 MED ORDER — LORATADINE 10 MG PO TABS
10.0000 mg | ORAL_TABLET | Freq: Every evening | ORAL | Status: DC
  Administered 2016-11-17 – 2016-11-18 (×2): 10 mg via ORAL
  Filled 2016-11-17 (×2): qty 1

## 2016-11-17 MED ORDER — ACETAMINOPHEN 325 MG PO TABS
650.0000 mg | ORAL_TABLET | Freq: Four times a day (QID) | ORAL | Status: DC | PRN
  Administered 2016-11-17: 18:00:00 650 mg via ORAL
  Filled 2016-11-17 (×2): qty 2

## 2016-11-17 MED ORDER — ONDANSETRON HCL 4 MG/2ML IJ SOLN: 4.0000 mg | Freq: Four times a day (QID) | INTRAMUSCULAR | Status: DC | PRN

## 2016-11-17 MED ORDER — SODIUM CHLORIDE 0.9% FLUSH
3.0000 mL | Freq: Two times a day (BID) | INTRAVENOUS | Status: DC
  Administered 2016-11-17 – 2016-11-18 (×3): 3 mL via INTRAVENOUS

## 2016-11-17 MED ORDER — CLOPIDOGREL BISULFATE 75 MG PO TABS
75.0000 mg | ORAL_TABLET | Freq: Every day | ORAL | Status: DC
  Administered 2016-11-17 – 2016-11-18 (×2): 75 mg via ORAL
  Filled 2016-11-17 (×2): qty 1

## 2016-11-17 MED ORDER — PRAVASTATIN SODIUM 40 MG PO TABS
20.0000 mg | ORAL_TABLET | Freq: Every day | ORAL | Status: DC
  Administered 2016-11-17 – 2016-11-18 (×2): 20 mg via ORAL
  Filled 2016-11-17: qty 1
  Filled 2016-11-17: qty 2

## 2016-11-17 MED ORDER — ADULT MULTIVITAMIN W/MINERALS CH
1.0000 | ORAL_TABLET | Freq: Every day | ORAL | Status: DC
  Administered 2016-11-17 – 2016-11-18 (×2): 1 via ORAL
  Filled 2016-11-17 (×2): qty 1

## 2016-11-17 MED ORDER — HYPROMELLOSE 0.3 % OP GEL
1.0000 "application " | OPHTHALMIC | Status: DC | PRN
  Filled 2016-11-17: qty 3.5

## 2016-11-17 MED ORDER — DOCUSATE SODIUM 100 MG PO CAPS
100.0000 mg | ORAL_CAPSULE | Freq: Two times a day (BID) | ORAL | Status: DC
  Administered 2016-11-17 – 2016-11-18 (×3): 100 mg via ORAL
  Filled 2016-11-17 (×3): qty 1

## 2016-11-17 MED ORDER — HYDRALAZINE HCL 20 MG/ML IJ SOLN
10.0000 mg | INTRAMUSCULAR | Status: DC | PRN
  Filled 2016-11-17: qty 0.5

## 2016-11-17 MED ORDER — ONDANSETRON HCL 4 MG PO TABS: 4.0000 mg | ORAL_TABLET | Freq: Four times a day (QID) | ORAL | Status: DC | PRN

## 2016-11-17 NOTE — Progress Notes (Signed)
Patient is admitted to room 115 with the diagnosis of gait instability. Patient resting comfortably with his eyes closed when he gets to the floor and as a result, admission profile not done  Will communicate this to the incoming nurse to do the profile when the patient wake up. Tele box called to CCMD by [facility] NT and [facility]. Skin assessment done with Aundria MemsErwin M. RN, -see findings on LDA documentation. No acute distress noted. Will continue to monitor.

## 2016-11-17 NOTE — Plan of Care (Signed)
Problem: SLP Dysphagia Goals Goal: Misc Dysphagia Goal Pt will safely tolerate po diet of least restrictive consistency w/ no overt s/s of aspiration noted by Staff/pt/family x3 sessions.    

## 2016-11-17 NOTE — Progress Notes (Signed)
[facility] [place] [facility]   PATIENT NAME: [name]    MR#:  [id number]  DATE OF BIRTH:  June 04, 1934  SUBJECTIVE:   Came in with dizziness and gait instability and a fall from wheelchair. Patient quite sleepy and not the best historian at this time no family in the room REVIEW OF SYSTEMS:   Review of Systems  Unable to perform ROS: Mental status change   Tolerating Diet: Speech therapy pending Tolerating PT: Pending  DRUG ALLERGIES:   Allergies  Allergen Reactions  . Hydrochlorothiazide     Documented on MAR  . Lisinopril     Documented on MAR    VITALS:  Blood pressure 135/75, pulse 93, temperature 97.9 F (36.6 C), temperature source Axillary, resp. rate 16, weight 61.4 kg (135 lb 6.4 oz), SpO2 95 %.  PHYSICAL EXAMINATION:   Physical Exam  GENERAL:  83 y.o.-year-old patient lying in the bed with no acute distress. Chronically ill EYES: Pupils equal, round, reactive to light and accommodation. No scleral icterus. Extraocular muscles intact.  HEENT: Head atraumatic, normocephalic. Oropharynx and nasopharynx clear.  NECK:  Supple, no jugular venous distention. No thyroid enlargement, no tenderness.  LUNGS: Normal breath sounds bilaterally, no wheezing, rales, rhonchi. No use of accessory muscles of respiration.  CARDIOVASCULAR: S1, S2 normal. No murmurs, rubs, or gallops.  ABDOMEN: Soft, nontender, nondistended. Bowel sounds present. No organomegaly or mass.  EXTREMITIES: No cyanosis, clubbing or edema b/l.  Left lower extremity chronic foot ulcer  NEUROLOGIC: Unable to examine in detail to the patient being sleepy. Moves all extremities okay  PSYCHIATRIC:  patient lethargic poor historian SKIN: As above  LABORATORY PANEL:  CBC  Recent Labs Lab 11/16/16 2102  WBC 7.1  HGB 12.0*  HCT 35.4*  PLT 291    Chemistries   Recent Labs Lab 11/16/16 2102  NA 141  K 3.8  CL 108  CO2 25  GLUCOSE 93  BUN 21*  CREATININE 1.18   CALCIUM 9.4  AST 36  ALT 20  ALKPHOS 89  BILITOT 0.4   Cardiac Enzymes  Recent Labs Lab 11/16/16 2102  TROPONINI <0.03   RADIOLOGY:  Ct Angio Head W Or Wo Contrast  Result Date: 11/17/2016 CLINICAL DATA:  83 y/o  M; ataxia. EXAM: CT ANGIOGRAPHY HEAD AND NECK TECHNIQUE: Multidetector CT imaging of the head and neck was performed using the standard protocol during bolus administration of intravenous contrast. Multiplanar CT image reconstructions and MIPs were obtained to evaluate the vascular anatomy. Carotid stenosis measurements (when applicable) are obtained utilizing NASCET criteria, using the distal internal carotid diameter as the denominator. CONTRAST:  75 cc Isovue 370 COMPARISON:  11/16/16 Ct of the head. 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 Right carotid system: No evidence of dissection, stenosis (50% or greater) or occlusion. Severe mixed plaque of the bifurcation without significant stenosis. Left carotid system: No evidence of dissection, stenosis (50% or greater) or occlusion. Severe mixed plaque of the bifurcation with 30% proximal ICA stenosis. Vertebral arteries: Left dominant. Right vertebral artery origin severe stenosis. Skeleton: C5-6 grade 1 anterolisthesis. Severe cervical spondylosis greatest at the C3-4 level there is moderate bony canal stenosis. Other neck: Negative. Upper chest: Severe emphysema of the lung apices. Moderate scarring and bronchiectasis. Review of the MIP images confirms the above findings CTA HEAD FINDINGS Anterior circulation: No proximal occlusion or vascular malformation.  Right ICA broad-based aneurysm superior directed near the terminus measuring 4 x 7 x 3 mm (7:109). Extensive calcific atherosclerosis of the carotid siphons with moderate-to-severe right paraclinoid stenosis and moderate left. Posterior circulation: No  significant stenosis, proximal occlusion, aneurysm, or vascular malformation. Venous sinuses: As permitted by contrast timing, patent. Anatomic variants: Bilateral fetal PCA. Small patent anterior communicating. Delayed phase: No abnormal intracranial enhancement. Review of the MIP images confirms the above findings IMPRESSION: 1. 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 Recommend annual imaging followup by CTA or MRA. This recommendation follows 2010 ACCF/AHA/AATS/ACR/ASA/SCA/SCAI/SIR/STS/SVM Guidelines for the Diagnosis and Management of Patients with Thoracic Aortic Disease. Circulation. 2010; 121: [id number]. 2. 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 3. 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 4. Right vertebral artery origin severe stenosis. 5. Severe cervical spondylosis greatest at the C3-4 level there is moderate bony canal stenosis. 6. No proximal occlusion or vascular malformation of the circle of Willis. 7. Right ICA broad-based aneurysm superior directed near the terminus measuring up to 7 mm. 8. Extensive calcific atherosclerosis of the carotid siphons with moderate-to-severe right paraclinoid stenosis and moderate left. Electronically Signed   By: Mitzi HansenLance  Furusawa-Stratton M.D.   On: 11/17/2016 00:03   Dg Chest 1 View  Result Date: 11/16/2016 CLINICAL DATA:  Dizziness and fall today. EXAM: CHEST 1 VIEW COMPARISON:  02/18/2016 and prior radiographs FINDINGS: Cardiomegaly again noted. There is no evidence of focal airspace disease, pulmonary edema, suspicious pulmonary nodule/mass, pleural effusion, or pneumothorax. No acute bony abnormalities are identified. IMPRESSION: No evidence of acute cardiopulmonary disease. Electronically Signed   By: Harmon PierJeffrey  Hu M.D.   On: 11/16/2016 21:42   Dg Pelvis 1-2 Views  Result Date: 11/16/2016 CLINICAL DATA:  Fall with pelvic pain. EXAM: PELVIS - 1-2 VIEW COMPARISON:  None.  FINDINGS: An intratrochanteric left femur fracture appears remote but correlate clinically. No other fracture, subluxation or dislocation identified. Degenerative changes in the lower lumbar spine and hips are noted. IMPRESSION: Intertrochanteric left femur fracture which appears remote but correlate clinically. Consider dedicated left hip films as indicated. Electronically Signed   By: Harmon PierJeffrey  Hu M.D.   On: 11/16/2016 21:46   Ct Head Wo Contrast  Result Date: 11/16/2016 CLINICAL DATA:  Dizziness. Weakness since this morning. Fall out of wheelchair. EXAM: CT HEAD WITHOUT CONTRAST TECHNIQUE: Contiguous axial images were obtained from the base of the skull through the vertex without intravenous contrast. COMPARISON:  Head CT and brain MRI 01/03/2014 FINDINGS: Brain: Similar degree of atrophy and chronic small vessel ischemia. Remote lacunar infarct in the right basal ganglia. No evidence of large vessel ischemia, mass effect, or midline shift. No hydrocephalus. No extra-axial or subdural fluid collection. Vascular: Atherosclerosis of skullbase vasculature without hyperdense vessel or abnormal calcification. Skull:  No fracture or focal lesion. Sinuses/Orbits: Chronic opacification of right mastoid air cells. Left mastoid air cells are well-aerated. Mucosal thickening in the left maxillary sinus with minimal debris. Remote left nasal bone fracture. Visualized orbits are unremarkable. Other: None. IMPRESSION: Similar atrophy, chronic small vessel ischemia and remote lacunar infarct in the right basal ganglia. No acute intracranial abnormality. Chronic opacification of right mastoid air cells. Minimal left maxillary sinus disease, possibly acute with intraluminal debris. Electronically Signed   By: Rubye OaksMelanie  Ehinger M.D.   On: 11/16/2016 22:11   Ct Angio Neck W And/or Wo Contrast  Result Date: 11/17/2016 CLINICAL DATA:  83 y/o  M; ataxia. EXAM: CT  ANGIOGRAPHY HEAD AND NECK TECHNIQUE: Multidetector CT imaging of  the head and neck was performed using the standard protocol during bolus administration of intravenous contrast. Multiplanar CT image reconstructions and MIPs were obtained to evaluate the vascular anatomy. Carotid stenosis measurements (when applicable) are obtained utilizing NASCET criteria, using the distal internal carotid diameter as the denominator. CONTRAST:  75 cc Isovue 370 COMPARISON:  11/16/16 Ct of the head. 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 Right carotid system: No evidence of dissection, stenosis (50% or greater) or occlusion. Severe mixed plaque of the bifurcation without significant stenosis. Left carotid system: No evidence of dissection, stenosis (50% or greater) or occlusion. Severe mixed plaque of the bifurcation with 30% proximal ICA stenosis. Vertebral arteries: Left dominant. Right vertebral artery origin severe stenosis. Skeleton: C5-6 grade 1 anterolisthesis. Severe cervical spondylosis greatest at the C3-4 level there is moderate bony canal stenosis. Other neck: Negative. Upper chest: Severe emphysema of the lung apices. Moderate scarring and bronchiectasis. Review of the MIP images confirms the above findings CTA HEAD FINDINGS Anterior circulation: No proximal occlusion or vascular malformation. Right ICA broad-based aneurysm superior directed near the terminus measuring 4 x 7 x 3 mm (7:109). Extensive calcific atherosclerosis of the carotid siphons with moderate-to-severe right paraclinoid stenosis and moderate left. Posterior circulation: No significant stenosis, proximal occlusion, aneurysm, or vascular malformation. Venous sinuses: As permitted by contrast timing, patent. Anatomic variants: Bilateral fetal PCA. Small patent anterior communicating. Delayed phase: No abnormal intracranial enhancement. Review of the MIP images confirms the above findings  IMPRESSION: 1. 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 Recommend annual imaging followup by CTA or MRA. This recommendation follows 2010 ACCF/AHA/AATS/ACR/ASA/SCA/SCAI/SIR/STS/SVM Guidelines for the Diagnosis and Management of Patients with Thoracic Aortic Disease. Circulation. 2010; 121: [id number]. 2. 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 3. 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 4. Right vertebral artery origin severe stenosis. 5. Severe cervical spondylosis greatest at the C3-4 level there is moderate bony canal stenosis. 6. No proximal occlusion or vascular malformation of the circle of Willis. 7. Right ICA broad-based aneurysm superior directed near the terminus measuring up to 7 mm. 8. Extensive calcific atherosclerosis of the carotid siphons with moderate-to-severe right paraclinoid stenosis and moderate left. Electronically Signed   By: Mitzi Hansen M.D.   On: 11/17/2016 00:03   Mr Maxine Glenn Head Wo Contrast  Result Date: 11/17/2016 CLINICAL DATA:  Dizziness and giddiness. EXAM: MRI HEAD WITHOUT CONTRAST MRA HEAD WITHOUT CONTRAST TECHNIQUE: Multiplanar, multiecho pulse sequences of the brain and surrounding structures were obtained without intravenous contrast. Angiographic images of the head were obtained using MRA technique without contrast. COMPARISON:  CT head 11/16/2016.  CTA head and neck 11/16/2016 FINDINGS: MRI HEAD FINDINGS Image quality degraded by moderate motion. Brain: Negative for acute infarct. Chronic ischemic changes in the white matter. Large chronic lacunar infarction in the left pons. Small chronic infarcts in the right cerebellum. Negative for hemorrhage or mass. Generalized atrophy. Vascular: Normal arterial flow voids. Skull and upper cervical spine: Negative Sinuses/Orbits: Mild mucosal edema in the paranasal sinuses. Normal orbit. Other: None MRA HEAD FINDINGS Image quality degraded  by moderate motion. Both vertebral arteries are patent. The basilar is patent. Posterior cerebral arteries patent bilaterally. Fetal origin of the posterior cerebral artery bilaterally. Atherosclerotic irregularity and moderate stenosis in the cavernous carotid  bilaterally. Mild to moderate stenosis of the right M1 segment. Right middle cerebral artery branches patent. Left middle cerebral artery widely patent. Anterior distal artery patent bilaterally. 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 IMPRESSION: Negative for acute infarct. Chronic ischemic change in the white matter and left pons MRA is significantly degraded by motion. No large vessel occlusion. Mild to moderate disease cavernous carotid bilaterally and mild stenosis right M1 segment. Right supraclinoid internal carotid artery aneurysm. See CTA report Electronically Signed   By: Marlan Palau M.D.   On: 11/17/2016 10:57   Mr Brain Wo Contrast  Result Date: 11/17/2016 CLINICAL DATA:  Dizziness and giddiness. EXAM: MRI HEAD WITHOUT CONTRAST MRA HEAD WITHOUT CONTRAST TECHNIQUE: Multiplanar, multiecho pulse sequences of the brain and surrounding structures were obtained without intravenous contrast. Angiographic images of the head were obtained using MRA technique without contrast. COMPARISON:  CT head 11/16/2016.  CTA head and neck 11/16/2016 FINDINGS: MRI HEAD FINDINGS Image quality degraded by moderate motion. Brain: Negative for acute infarct. Chronic ischemic changes in the white matter. Large chronic lacunar infarction in the left pons. Small chronic infarcts in the right cerebellum. Negative for hemorrhage or mass. Generalized atrophy. Vascular: Normal arterial flow voids. Skull and upper cervical spine: Negative Sinuses/Orbits: Mild mucosal edema in the paranasal sinuses. Normal orbit. Other: None MRA HEAD FINDINGS Image quality degraded by moderate motion. Both vertebral arteries are patent. The  basilar is patent. Posterior cerebral arteries patent bilaterally. Fetal origin of the posterior cerebral artery bilaterally. Atherosclerotic irregularity and moderate stenosis in the cavernous carotid bilaterally. Mild to moderate stenosis of the right M1 segment. Right middle cerebral artery branches patent. Left middle cerebral artery widely patent. Anterior distal artery patent bilaterally. 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 IMPRESSION: Negative for acute infarct. Chronic ischemic change in the white matter and left pons MRA is significantly degraded by motion. No large vessel occlusion. Mild to moderate disease cavernous carotid bilaterally and mild stenosis right M1 segment. Right supraclinoid internal carotid artery aneurysm. See CTA report Electronically Signed   By: Marlan Palau M.D.   On: 11/17/2016 10:57   Dg Femur Min 2 Views Left  Result Date: 11/16/2016 CLINICAL DATA:  Pain after fall today EXAM: LEFT FEMUR 2 VIEWS COMPARISON:  None. FINDINGS: The knee is flexed on all the images limiting assessment. There is femorotibial joint space narrowing. Likewise there is joint space narrowing of the left hip. No acute displaced fracture nor dislocation is noted. Vascular calcifications are present. 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 No acute fracture nor dislocations. Electronically Signed   By: Tollie Eth M.D.   On: 11/16/2016 22:59   ASSESSMENT AND PLAN:   [name]  is a 83 y.o. male with a known history of CVA with left-sided weakness, hypertension, gout, arthritis presented to the emergency room with generalized weakness and dizziness. Patient had 2 falls yesterday and was feeling dizzy. He is mostly wheelchair bound but fell down from the wheelchair while going downhill yesterday. He says he feels dizzy whenever he stands up. Also has some pain in the knee for the last couple of months.  1. Dizziness/gait  instability Patient came in after a fall from his wheelchair yesterday. He is mainly wheelchair-bound. -Neurology consultation appreciated by Dr. Thad Ranger. MRI/MRA brain no acute changes. 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 and vascular surgery Dr. Gilda Crease recommend no urgent intervention needed.   3. Hypertension continue home meds   4.BPH on flomax  5.DVT prophylaxis heparin  6. Left femoral intertrochanteric fracture appears remote according to x-ray -We'll continue physical therapy with weightbearing as tolerated and patient complains of pain we'll get CT of the hip   Case discussed with Care Management/Social Worker. Management plans discussed with the patient, family and they are in agreement.  CODE STATUS: FULL   TOTAL TIME TAKING CARE OF THIS PATIENT: *30* minutes.  >50% time spent on counselling and coordination of care  POSSIBLE D/C IN *1-2* DAYS, DEPENDING ON CLINICAL CONDITION.  Note: This dictation was prepared with Dragon dictation along with smaller phrase technology. Any transcriptional errors that result from this process are unintentional.  Victorina Kable M.D on 11/17/2016 at 1:32 PM  Between 7am to 6pm - Pager - [phone]  After 6pm go to [email or web address] - Social research officer, government  [facility]  Office  [phone]  CC: Primary care physician; [name], MD

## 2016-11-17 NOTE — Consult Note (Signed)
[facility] Vascular Consult Note  MRN : [id number]  [name] Englert Sr. is a 83 y.o. (Jan 24, 1934) male who presents with chief complaint of  Chief Complaint  Patient presents with  . Weakness  . Fall  .  History of Present Illness: I am asked to see the patient by Dr. Allena Katz from internal medicine for an intracranial aneurysm. The patient has apparently been complaining of dizziness. He provides no history today and seems to be nonverbal. His son is at the bedside and says that they think he is dizzy. I'm not sure how we can assess this. He does not ambulate and is only in a wheelchair with assistance. He has not had any obvious focal neurologic symptoms recently, but has a history of a stroke with severe left-sided weakness resultant. This is quite difficult to assess today really cannot dissipate and then the exam. He appears to have flexion contracture of his legs and his son says he has not walked in quite some time. I then independently reviewed a CT angiogram and he does have about a 7 mm intracranial aneurysm at the terminus of the right ICA. There is no bleeding or sign of hemorrhage. Other imaging studies do not show any stroke. Also findings on his CT scan showed mild bilateral cervical carotid disease in the less than 50% range. 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  Current Facility-Administered Medications  Medication Dose Route Frequency Provider Last Rate Last Dose  . 0.9 %  sodium chloride infusion  250 mL Intravenous PRN Pyreddy, Vivien Rota, MD      . acetaminophen (TYLENOL) tablet 650 mg  650 mg Oral Q6H PRN Pyreddy, Vivien Rota, MD       Or  . acetaminophen (TYLENOL) suppository 650 mg  650 mg Rectal Q6H PRN Pyreddy, Vivien Rota, MD      . acetaminophen (TYLENOL) tablet 650 mg  650 mg Oral Q6H PRN Ihor Austin, MD   650 mg at 11/17/16 1807  . albuterol (PROVENTIL)  (2.5 MG/3ML) 0.083% nebulizer solution 2.5 mg  2.5 mg Inhalation Q6H PRN Pyreddy, Vivien Rota, MD      . amLODipine (NORVASC) tablet 5 mg  5 mg Oral Daily Pyreddy, Pavan, MD   5 mg at 11/17/16 1109  . aspirin EC tablet 81 mg  81 mg Oral Daily Pyreddy, Vivien Rota, MD   81 mg at 11/17/16 1326  . clopidogrel (PLAVIX) tablet 75 mg  75 mg Oral Daily Pyreddy, Vivien Rota, MD   75 mg at 11/17/16 1154  . docusate sodium (COLACE) capsule 100 mg  100 mg Oral BID Ihor Austin, MD   100 mg at 11/17/16 1154  . enoxaparin (LOVENOX) injection 40 mg  40 mg Subcutaneous Q24H Ihor Austin, MD   40 mg at 11/17/16 0844  . febuxostat (ULORIC) tablet 40 mg  40 mg Oral Daily Pyreddy, Vivien Rota, MD   40 mg at 11/17/16 1156  . feeding supplement (ENSURE ENLIVE) (ENSURE ENLIVE) liquid 237 mL  237 mL Oral TID BM Enedina Finner, MD   237 mL at 11/17/16 1515  . hydrALAZINE (APRESOLINE) injection 10 mg  10 mg Intravenous Q4H PRN Pyreddy, Vivien Rota, MD      . ipratropium (ATROVENT) 0.03 % nasal spray 1 spray  1 spray Each Nare Q4H PRN Pyreddy, Pavan, MD      . loratadine (CLARITIN) tablet 10 mg  10 mg Oral QPM Ihor Austin, MD   10  mg at 11/17/16 1807  . multivitamin with minerals tablet 1 tablet  1 tablet Oral Daily Enedina Finner, MD   1 tablet at 11/17/16 1809  . ondansetron (ZOFRAN) tablet 4 mg  4 mg Oral Q6H PRN Pyreddy, Vivien Rota, MD       Or  . ondansetron (ZOFRAN) injection 4 mg  4 mg Intravenous Q6H PRN Pyreddy, Pavan, MD      . polyethylene glycol 0.4% and propylene glycol 0.3% (SYSTANE) ophthalmic gel  1 application Left Eye PRN Pyreddy, Vivien Rota, MD      . pravastatin (PRAVACHOL) tablet 20 mg  20 mg Oral q1800 Ihor Austin, MD   20 mg at 11/17/16 1807  . senna-docusate (Senokot-S) tablet 1 tablet  1 tablet Oral QHS PRN Pyreddy, Vivien Rota, MD      . sodium chloride flush (NS) 0.9 % injection 3 mL  3 mL Intravenous Q12H Pyreddy, Pavan, MD   3 mL at 11/17/16 1000  . sodium chloride flush (NS) 0.9 % injection 3 mL  3 mL Intravenous PRN Pyreddy, Vivien Rota,  MD      . tamsulosin (FLOMAX) capsule 0.4 mg  0.4 mg Oral Daily Pyreddy, Vivien Rota, MD   0.4 mg at 11/17/16 1154    Past Medical History:  Diagnosis Date  . Arthritis   . Gout   . Hypertension   . Stroke [facility])    left sided weakness    Past Surgical History:  Procedure Laterality Date  . INGUINAL HERNIA REPAIR Right 09/21/2015   Procedure: HERNIA REPAIR INGUINAL ADULT;  Surgeon: [name], MD;  Location: [facility] ORS;  Service: General;  Laterality: Right;  . LOWER EXTREMITY ANGIOGRAPHY Left 09/30/2016   Procedure: Lower Extremity Angiography;  Surgeon: [name], MD;  Location: [facility] INVASIVE CV LAB;  Service: Cardiovascular;  Laterality: Left;    Social History Social History  Substance Use Topics  . Smoking status: Current Every Day Smoker    Packs/day: 1.00    Years: 50.00    Types: Cigarettes  . Smokeless tobacco: Never Used  . Alcohol use No     Comment: previous heavy drinker now 1x month  Nonambulatory according to his son. No IV drug use   Family History Family History  Problem Relation Age of Onset  . Kidney disease Neg Hx   . Prostate cancer Neg Hx   No history of bleeding disorders, clotting disorders, autoimmune diseases, or aneurysms  Allergies  Allergen Reactions  . Hydrochlorothiazide     Documented on MAR  . Lisinopril     Documented on MAR     REVIEW OF SYSTEMS (Negative unless checked)  Unable to obtain adequate ROS secondary to patient not verbally answering any questions. ? Whether or not this is his baseline but I suspect it is.   Physical Examination  Vitals:   11/17/16 1100 11/17/16 1322 11/17/16 1345 11/17/16 1400  BP: (!) 172/92 135/75 (!) 188/97 (!) 146/76  Pulse:  93 98 82  Resp:  16 20   Temp:  97.9 F (36.6 C) 98.5 F (36.9 C)   TempSrc:  Axillary Oral   SpO2:  95% 94%   Weight:       Body mass index is 24.76 kg/m. Gen:  WD/WN, Debilitated African-American male sitting almost in the fetal position in bed with  flexion contractures of his legs Head: [place]/AT, positive temporalis wasting. Ear/Nose/Throat: Hearing grossly intact, nares w/o erythema or drainage, oropharynx w/o Erythema/Exudate Eyes: Sclera non-icteric, conjunctiva clear Neck: Trachea midline.  No  JVD.  Pulmonary:  Good air movement, respirations not labored, equal bilaterally.  Cardiac: RRR, normal S1, S2. Vascular:  Vessel Right Left  Radial Palpable Palpable                                   Gastrointestinal: soft, non-tender/non-distended  Musculoskeletal: Flexion contractures are present in the lower extremities as best I can tell. He is rigid and I cannot straighten his legs today. Very difficult to assess strength. He has do a dermal on his right heel and ankle concerning for pressure ulcers. No ulceration seen on the left leg. Neurologic: Difficult to assess. Patient not really verbal and cooperative with exam. Has flexion contractures with spasticity and rigidity in his legs. Psychiatric: Patient not really verbal. Poor insight poor historian. Dermatologic: Duoderm on right foot and ankle but no obvious open wounds Lymph : No Cervical, Axillary, or Inguinal lymphadenopathy.      CBC Lab Results  Component Value Date   WBC 7.1 11/16/2016   HGB 12.0 (L) 11/16/2016   HCT 35.4 (L) 11/16/2016   MCV 90.3 11/16/2016   PLT 291 11/16/2016    BMET    Component Value Date/Time   NA 141 11/16/2016 2102   NA 135 (L) 01/03/2014 1959   K 3.8 11/16/2016 2102   K 4.5 01/03/2014 1959   CL 108 11/16/2016 2102   CL 98 01/03/2014 1959   CO2 25 11/16/2016 2102   CO2 28 01/03/2014 1959   GLUCOSE 93 11/16/2016 2102   GLUCOSE 84 01/03/2014 1959   BUN 21 (H) 11/16/2016 2102   BUN 30 (H) 01/03/2014 1959   CREATININE 1.18 11/16/2016 2102   CREATININE 1.20 01/03/2014 1959   CALCIUM 9.4 11/16/2016 2102   CALCIUM 9.8 01/03/2014 1959   GFRNONAA 56 (L) 11/16/2016 2102   GFRNONAA 58 (L) 01/03/2014 1959   GFRAA >60 11/16/2016  2102   GFRAA >60 01/03/2014 1959   Estimated Creatinine Clearance: 38.6 mL/min (by C-G formula based on SCr of 1.18 mg/dL).  COAG Lab Results  Component Value Date   INR 1.03 09/11/2015    Radiology Ct Angio Head W Or Wo Contrast  Result Date: 11/17/2016 CLINICAL DATA:  83 y/o  M; ataxia. EXAM: CT ANGIOGRAPHY HEAD AND NECK TECHNIQUE: Multidetector CT imaging of the head and neck was performed using the standard protocol during bolus administration of intravenous contrast. Multiplanar CT image reconstructions and MIPs were obtained to evaluate the vascular anatomy. Carotid stenosis measurements (when applicable) are obtained utilizing NASCET criteria, using the distal internal carotid diameter as the denominator. CONTRAST:  75 cc Isovue 370 COMPARISON:  11/16/16 Ct of the head. 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 Right carotid system: No evidence of dissection, stenosis (50% or greater) or occlusion. Severe mixed plaque of the bifurcation without significant stenosis. Left carotid system: No evidence of dissection, stenosis (50% or greater) or occlusion. Severe mixed plaque of the bifurcation with 30% proximal ICA stenosis. Vertebral arteries: Left dominant. Right vertebral artery origin severe stenosis. Skeleton: C5-6 grade 1 anterolisthesis. Severe cervical spondylosis greatest at the C3-4 level there is moderate bony canal stenosis. Other neck: Negative. Upper chest: Severe emphysema of the lung apices. Moderate scarring and bronchiectasis. Review of the MIP images confirms the above findings CTA HEAD FINDINGS Anterior circulation: No proximal occlusion or  vascular malformation. Right ICA broad-based aneurysm superior directed near the terminus measuring 4 x 7 x 3 mm (7:109). Extensive calcific atherosclerosis of the carotid siphons with moderate-to-severe right paraclinoid  stenosis and moderate left. Posterior circulation: No significant stenosis, proximal occlusion, aneurysm, or vascular malformation. Venous sinuses: As permitted by contrast timing, patent. Anatomic variants: Bilateral fetal PCA. Small patent anterior communicating. Delayed phase: No abnormal intracranial enhancement. Review of the MIP images confirms the above findings IMPRESSION: 1. 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 Recommend annual imaging followup by CTA or MRA. This recommendation follows 2010 ACCF/AHA/AATS/ACR/ASA/SCA/SCAI/SIR/STS/SVM Guidelines for the Diagnosis and Management of Patients with Thoracic Aortic Disease. Circulation. 2010; 121: Z610-R604. 2. 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 3. 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 4. Right vertebral artery origin severe stenosis. 5. Severe cervical spondylosis greatest at the C3-4 level there is moderate bony canal stenosis. 6. No proximal occlusion or vascular malformation of the circle of Willis. 7. Right ICA broad-based aneurysm superior directed near the terminus measuring up to 7 mm. 8. Extensive calcific atherosclerosis of the carotid siphons with moderate-to-severe right paraclinoid stenosis and moderate left. Electronically Signed   By: Mitzi Hansen M.D.   On: 11/17/2016 00:03   Dg Chest 1 View  Result Date: 11/16/2016 CLINICAL DATA:  Dizziness and fall today. EXAM: CHEST 1 VIEW COMPARISON:  02/18/2016 and prior radiographs FINDINGS: Cardiomegaly again noted. There is no evidence of focal airspace disease, pulmonary edema, suspicious pulmonary nodule/mass, pleural effusion, or pneumothorax. No acute bony abnormalities are identified. IMPRESSION: No evidence of acute cardiopulmonary disease. Electronically Signed   By: Harmon Pier M.D.   On: 11/16/2016 21:42   Dg Pelvis 1-2 Views  Result Date: 11/16/2016 CLINICAL DATA:  Fall with  pelvic pain. EXAM: PELVIS - 1-2 VIEW COMPARISON:  None. FINDINGS: An intratrochanteric left femur fracture appears remote but correlate clinically. No other fracture, subluxation or dislocation identified. Degenerative changes in the lower lumbar spine and hips are noted. IMPRESSION: Intertrochanteric left femur fracture which appears remote but correlate clinically. Consider dedicated left hip films as indicated. Electronically Signed   By: Harmon Pier M.D.   On: 11/16/2016 21:46   Ct Head Wo Contrast  Result Date: 11/16/2016 CLINICAL DATA:  Dizziness. Weakness since this morning. Fall out of wheelchair. EXAM: CT HEAD WITHOUT CONTRAST TECHNIQUE: Contiguous axial images were obtained from the base of the skull through the vertex without intravenous contrast. COMPARISON:  Head CT and brain MRI 01/03/2014 FINDINGS: Brain: Similar degree of atrophy and chronic small vessel ischemia. Remote lacunar infarct in the right basal ganglia. No evidence of large vessel ischemia, mass effect, or midline shift. No hydrocephalus. No extra-axial or subdural fluid collection. Vascular: Atherosclerosis of skullbase vasculature without hyperdense vessel or abnormal calcification. Skull:  No fracture or focal lesion. Sinuses/Orbits: Chronic opacification of right mastoid air cells. Left mastoid air cells are well-aerated. Mucosal thickening in the left maxillary sinus with minimal debris. Remote left nasal bone fracture. Visualized orbits are unremarkable. Other: None. IMPRESSION: Similar atrophy, chronic small vessel ischemia and remote lacunar infarct in the right basal ganglia. No acute intracranial abnormality. Chronic opacification of right mastoid air cells. Minimal left maxillary sinus disease, possibly acute with intraluminal debris. Electronically Signed   By: Rubye Oaks M.D.   On: 11/16/2016 22:11   Ct Angio Neck W And/or Wo Contrast  Result Date: 11/17/2016 CLINICAL DATA:  83 y/o  M; ataxia.  EXAM: CT  ANGIOGRAPHY HEAD AND NECK TECHNIQUE: Multidetector CT imaging of the head and neck was performed using the standard protocol during bolus administration of intravenous contrast. Multiplanar CT image reconstructions and MIPs were obtained to evaluate the vascular anatomy. Carotid stenosis measurements (when applicable) are obtained utilizing NASCET criteria, using the distal internal carotid diameter as the denominator. CONTRAST:  75 cc Isovue 370 COMPARISON:  11/16/16 Ct of the head. 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 Right carotid system: No evidence of dissection, stenosis (50% or greater) or occlusion. Severe mixed plaque of the bifurcation without significant stenosis. Left carotid system: No evidence of dissection, stenosis (50% or greater) or occlusion. Severe mixed plaque of the bifurcation with 30% proximal ICA stenosis. Vertebral arteries: Left dominant. Right vertebral artery origin severe stenosis. Skeleton: C5-6 grade 1 anterolisthesis. Severe cervical spondylosis greatest at the C3-4 level there is moderate bony canal stenosis. Other neck: Negative. Upper chest: Severe emphysema of the lung apices. Moderate scarring and bronchiectasis. Review of the MIP images confirms the above findings CTA HEAD FINDINGS Anterior circulation: No proximal occlusion or vascular malformation. Right ICA broad-based aneurysm superior directed near the terminus measuring 4 x 7 x 3 mm (7:109). Extensive calcific atherosclerosis of the carotid siphons with moderate-to-severe right paraclinoid stenosis and moderate left. Posterior circulation: No significant stenosis, proximal occlusion, aneurysm, or vascular malformation. Venous sinuses: As permitted by contrast timing, patent. Anatomic variants: Bilateral fetal PCA. Small patent anterior communicating. Delayed phase: No abnormal intracranial  enhancement. Review of the MIP images confirms the above findings IMPRESSION: 1. 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 Recommend annual imaging followup by CTA or MRA. This recommendation follows 2010 ACCF/AHA/AATS/ACR/ASA/SCA/SCAI/SIR/STS/SVM Guidelines for the Diagnosis and Management of Patients with Thoracic Aortic Disease. Circulation. 2010; 121: Z610-R604. 2. 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 3. 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 4. Right vertebral artery origin severe stenosis. 5. Severe cervical spondylosis greatest at the C3-4 level there is moderate bony canal stenosis. 6. No proximal occlusion or vascular malformation of the circle of Willis. 7. Right ICA broad-based aneurysm superior directed near the terminus measuring up to 7 mm. 8. Extensive calcific atherosclerosis of the carotid siphons with moderate-to-severe right paraclinoid stenosis and moderate left. Electronically Signed   By: Mitzi Hansen M.D.   On: 11/17/2016 00:03   Mr Maxine Glenn Head Wo Contrast  Result Date: 11/17/2016 CLINICAL DATA:  Dizziness and giddiness. EXAM: MRI HEAD WITHOUT CONTRAST MRA HEAD WITHOUT CONTRAST TECHNIQUE: Multiplanar, multiecho pulse sequences of the brain and surrounding structures were obtained without intravenous contrast. Angiographic images of the head were obtained using MRA technique without contrast. COMPARISON:  CT head 11/16/2016.  CTA head and neck 11/16/2016 FINDINGS: MRI HEAD FINDINGS Image quality degraded by moderate motion. Brain: Negative for acute infarct. Chronic ischemic changes in the white matter. Large chronic lacunar infarction in the left pons. Small chronic infarcts in the right cerebellum. Negative for hemorrhage or mass. Generalized atrophy. Vascular: Normal arterial flow voids. Skull and upper cervical spine: Negative Sinuses/Orbits: Mild mucosal edema in the paranasal sinuses.  Normal orbit. Other: None MRA HEAD FINDINGS Image quality degraded by moderate motion. Both vertebral arteries are patent. The basilar is patent. Posterior cerebral arteries patent bilaterally. Fetal origin of the posterior cerebral artery bilaterally. Atherosclerotic irregularity and moderate stenosis in the  cavernous carotid bilaterally. Mild to moderate stenosis of the right M1 segment. Right middle cerebral artery branches patent. Left middle cerebral artery widely patent. Anterior distal artery patent bilaterally. 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 IMPRESSION: Negative for acute infarct. Chronic ischemic change in the white matter and left pons MRA is significantly degraded by motion. No large vessel occlusion. Mild to moderate disease cavernous carotid bilaterally and mild stenosis right M1 segment. Right supraclinoid internal carotid artery aneurysm. See CTA report Electronically Signed   By: Marlan Palauharles  Clark M.D.   On: 11/17/2016 10:57   Mr Brain Wo Contrast  Result Date: 11/17/2016 CLINICAL DATA:  Dizziness and giddiness. EXAM: MRI HEAD WITHOUT CONTRAST MRA HEAD WITHOUT CONTRAST TECHNIQUE: Multiplanar, multiecho pulse sequences of the brain and surrounding structures were obtained without intravenous contrast. Angiographic images of the head were obtained using MRA technique without contrast. COMPARISON:  CT head 11/16/2016.  CTA head and neck 11/16/2016 FINDINGS: MRI HEAD FINDINGS Image quality degraded by moderate motion. Brain: Negative for acute infarct. Chronic ischemic changes in the white matter. Large chronic lacunar infarction in the left pons. Small chronic infarcts in the right cerebellum. Negative for hemorrhage or mass. Generalized atrophy. Vascular: Normal arterial flow voids. Skull and upper cervical spine: Negative Sinuses/Orbits: Mild mucosal edema in the paranasal sinuses. Normal orbit. Other: None MRA HEAD FINDINGS Image quality  degraded by moderate motion. Both vertebral arteries are patent. The basilar is patent. Posterior cerebral arteries patent bilaterally. Fetal origin of the posterior cerebral artery bilaterally. Atherosclerotic irregularity and moderate stenosis in the cavernous carotid bilaterally. Mild to moderate stenosis of the right M1 segment. Right middle cerebral artery branches patent. Left middle cerebral artery widely patent. Anterior distal artery patent bilaterally. 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 IMPRESSION: Negative for acute infarct. Chronic ischemic change in the white matter and left pons MRA is significantly degraded by motion. No large vessel occlusion. Mild to moderate disease cavernous carotid bilaterally and mild stenosis right M1 segment. Right supraclinoid internal carotid artery aneurysm. See CTA report Electronically Signed   By: Marlan Palauharles  Clark M.D.   On: 11/17/2016 10:57   Dg Femur Min 2 Views Left  Result Date: 11/16/2016 CLINICAL DATA:  Pain after fall today EXAM: LEFT FEMUR 2 VIEWS COMPARISON:  None. FINDINGS: The knee is flexed on all the images limiting assessment. There is femorotibial joint space narrowing. Likewise there is joint space narrowing of the left hip. No acute displaced fracture nor dislocation is noted. Vascular calcifications are present. 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 No acute fracture nor dislocations. Electronically Signed   By: Tollie Ethavid  Kwon M.D.   On: 11/16/2016 22:59      Assessment/Plan 1. 7 mm right ICA aneurysm intracranially. I do not treat these and would agree with a neurosurgery consult although I suspect this would not be treated with any intervention on a debilitated bedridden 83 year old contracted patient.  2.  mild cervical carotid disease that appears to be less than 50%. No intervention required. Can be checked on a annual basis if the family desires. He would likely not be a  candidate for any revascularization given his debilitated state. Antiplatelet therapy with aspirin and Plavix as well as statin agent would be recommended. 3. 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 No associated stenosis. Artery is mildly enlarged but not frankly aneurysmal. Would not recommend intervention is debilitated nonambulatory patient. Antiplatelet  therapy with aspirin and Plavix as well as statin agent would be recommended. This could also be checked on 6-12 month intervals with CT scan if the family desires.  4. History of stroke with left-sided weakness. He seems to be profoundly debilitated by this. This was unlikely secondary to his cervical carotid stenosis. There is no current sign of bleeding from his intracranial aneurysm and I would doubt that this was the cause either.    Festus Barren, MD  11/17/2016 6:16 PM    This note was created with Dragon medical transcription system.  Any error is purely unintentional

## 2016-11-17 NOTE — Consult Note (Signed)
WOC Nurse wound consult note Reason for Consult: History of PAD, smoking and chronic nonhealing ulcer to right lateral malleolus and right lateral foot, intact dry eschar.  Wound type:PAD with dry intact eschar to right foot.  Pressure Injury POA: N/A Measurement: Right malleolus:  5.5 cm x 3 cm  Right lateral foot:  4.5 cm x 3 cm  Wound bed:dry intact eschar Drainage (amount, consistency, odor) No drainage.  Musty odor.  Periwound:intact Dressing procedure/placement/frequency:Dry dressing to right malleolus and right lateral foot.  Change daily.  Will not follow at this time.  Please re-consult if needed.  Maple HudsonKaren Jaleeya Mcnelly RN BSN [facility] Pager [phone]

## 2016-11-17 NOTE — Consult Note (Signed)
Reason for Consult:Dizziness and gait instability Referring Physician: Allena Katz  CC: Dizziness  HPI: [name] Kellis Sr. is an 83 y.o. male who lives in a facility.  It appears that he is wheelchair bound.  Patient does not provide much in the way of history.  From the chart it seems that his initial complaint was that of "feeling drunk".  He has also had falls recently.  On the day of presentation he fell from his wheelchair while going downhill.  Patient admits to falls but will give no details.  He does not admit to feelings of dizziness, lightheadedness or vertigo.    Past Medical History:  Diagnosis Date  . Arthritis   . Gout   . Hypertension   . Stroke [facility])    left sided weakness    Past Surgical History:  Procedure Laterality Date  . INGUINAL HERNIA REPAIR Right 09/21/2015   Procedure: HERNIA REPAIR INGUINAL ADULT;  Surgeon: [name], MD;  Location: [facility] ORS;  Service: General;  Laterality: Right;  . LOWER EXTREMITY ANGIOGRAPHY Left 09/30/2016   Procedure: Lower Extremity Angiography;  Surgeon: [name], MD;  Location: [facility] INVASIVE CV LAB;  Service: Cardiovascular;  Laterality: Left;    Family History  Problem Relation Age of Onset  . Kidney disease Neg Hx   . Prostate cancer Neg Hx     Social History:  reports that he has been smoking Cigarettes.  He has a 50.00 pack-year smoking history. He has never used smokeless tobacco. He reports that he does not drink alcohol or use drugs.  Allergies  Allergen Reactions  . Hydrochlorothiazide     Documented on MAR  . Lisinopril     Documented on MAR    Medications:  I have reviewed the patient's current medications. Prior to Admission:  Prescriptions Prior to Admission  Medication Sig Dispense Refill Last Dose  . acetaminophen (TYLENOL) 325 MG tablet Take 650 mg by mouth every 6 (six) hours as needed for moderate pain.   11/16/2016 at Unknown time  . albuterol (PROVENTIL HFA;VENTOLIN HFA) 108 (90 Base)  MCG/ACT inhaler Inhale 2 puffs into the lungs every 6 (six) hours as needed for wheezing or shortness of breath. 1 Inhaler 2 prn  . amLODipine (NORVASC) 5 MG tablet Take 5 mg by mouth daily.   11/16/2016 at Unknown time  . aspirin EC 81 MG tablet Take 81 mg by mouth daily.   11/16/2016 at Unknown time  . clopidogrel (PLAVIX) 75 MG tablet Take 1 tablet (75 mg total) by mouth daily. 30 tablet 11 11/16/2016 at Unknown time  . docusate sodium (COLACE) 100 MG capsule Take 100 mg by mouth 2 (two) times daily.   11/16/2016 at Unknown time  . DULoxetine (CYMBALTA) 30 MG capsule Take 30 mg by mouth daily.   11/16/2016 at Unknown time  . febuxostat (ULORIC) 40 MG tablet Take 40 mg by mouth daily.   11/16/2016 at Unknown time  . hypromellose (GENTEAL SEVERE) 0.3 % GEL ophthalmic ointment Place 1 application into the left eye as needed (irritation/watering).    prn  . ipratropium (ATROVENT) 0.03 % nasal spray Place 1 spray into both nostrils every 4 (four) hours as needed (congestion).    prn  . loratadine (CLARITIN) 10 MG tablet Take 10 mg by mouth every evening.    11/16/2016 at Unknown time  . lovastatin (MEVACOR) 20 MG tablet Take 20 mg by mouth every evening.   11/16/2016 at Unknown time  . meloxicam (MOBIC) 7.5 MG  tablet Take 7.5 mg by mouth every evening.    11/16/2016 at Unknown time  . tamsulosin (FLOMAX) 0.4 MG CAPS capsule Take 1 capsule (0.4 mg total) by mouth daily. 90 capsule 3 11/16/2016 at Unknown time   Scheduled: . amLODipine  5 mg Oral Daily  . aspirin EC  81 mg Oral Daily  . clopidogrel  75 mg Oral Daily  . docusate sodium  100 mg Oral BID  . enoxaparin (LOVENOX) injection  40 mg Subcutaneous Q24H  . febuxostat  40 mg Oral Daily  . loratadine  10 mg Oral QPM  . pravastatin  20 mg Oral q1800  . sodium chloride flush  3 mL Intravenous Q12H  . tamsulosin  0.4 mg Oral Daily    ROS: History obtained from the patient  General ROS: negative for - chills, fatigue, fever, night sweats, weight gain  or weight loss Psychological ROS: negative for - behavioral disorder, hallucinations, memory difficulties, mood swings or suicidal ideation Ophthalmic ROS: negative for - blurry vision, double vision, eye pain or loss of vision ENT ROS: negative for - epistaxis, nasal discharge, oral lesions, sore throat, tinnitus or vertigo Allergy and Immunology ROS: negative for - hives or itchy/watery eyes Hematological and Lymphatic ROS: negative for - bleeding problems, bruising or swollen lymph nodes Endocrine ROS: negative for - galactorrhea, hair pattern changes, polydipsia/polyuria or temperature intolerance Respiratory ROS: negative for - cough, hemoptysis, shortness of breath or wheezing Cardiovascular ROS: negative for - chest pain, dyspnea on exertion, edema or irregular heartbeat Gastrointestinal ROS: negative for - abdominal pain, diarrhea, hematemesis, nausea/vomiting or stool incontinence Genito-Urinary ROS: negative for - dysuria, hematuria, incontinence or urinary frequency/urgency Musculoskeletal ROS: leg pain Neurological ROS: as noted in HPI Dermatological ROS: negative for rash and skin lesion changes  Physical Examination: Blood pressure (!) 172/92, pulse (!) 101, temperature 98.7 F (37.1 C), temperature source Oral, resp. rate 16, weight 61.4 kg (135 lb 6.4 oz), SpO2 97 %.  HEENT-  Normocephalic, no lesions, without obvious abnormality.  Normal external eye and conjunctiva.  Normal TM's bilaterally.  Normal auditory canals and external ears. Normal external nose, mucus membranes and septum.  Normal pharynx. Cardiovascular- S1, S2 normal, pulses palpable throughout   Lungs- chest clear, no wheezing, rales, normal symmetric air entry Abdomen- soft, non-tender; bowel sounds normal; no masses,  no organomegaly Extremities- pain on light palpation of legs Lymph-no adenopathy palpable Musculoskeletal-muscle tenderness in the lower extremities Skin-heel breakdown  Neurological  Examination   Mental Status: Patient lies in a ball with eyes closed.  Speech is fluent.  Follows commands. Patient not particularly cooperative.  Cranial Nerves: II: Discs flat bilaterally; Visual fields grossly normal, pupils equal, round, reactive to light and accommodation III,IV, VI: ptosis not present, extra-ocular motions intact bilaterally V,VII: face appears symmetric, facial light touch sensation normal bilaterally VIII: hearing normal bilaterally IX,X: gag reflex present XI: bilateral shoulder shrug XII: midline tongue extension Motor: Moves both upper extremities with strong 5/5 hand grip bilaterally.  Patient will not move his lower extremities due to complaints of pain Sensory: Sensation intact throughout, bilaterally Deep Tendon Reflexes: Unable to test due to patient's complaints of pain Plantars: Right: upgoing   Left: downgoing Cerebellar: Unable to test due to patient's cooperation Gait: not tested due to safety concerns    Laboratory Studies:   Basic Metabolic Panel:  Recent Labs Lab 11/16/16 2102  NA 141  K 3.8  CL 108  CO2 25  GLUCOSE 93  BUN 21*  CREATININE  1.18  CALCIUM 9.4    Liver Function Tests:  Recent Labs Lab 11/16/16 2102  AST 36  ALT 20  ALKPHOS 89  BILITOT 0.4  PROT 7.8  ALBUMIN 3.4*   No results for input(s): LIPASE, AMYLASE in the last 168 hours.  Recent Labs Lab 11/16/16 2102  AMMONIA <9*    CBC:  Recent Labs Lab 11/16/16 2102  WBC 7.1  NEUTROABS 3.7  HGB 12.0*  HCT 35.4*  MCV 90.3  PLT 291    Cardiac Enzymes:  Recent Labs Lab 11/16/16 2102  TROPONINI <0.03    BNP: Invalid input(s): POCBNP  CBG: No results for input(s): GLUCAP in the last 168 hours.  Microbiology: Results for orders placed or performed during the hospital encounter of 11/16/16  MRSA PCR Screening     Status: None   Collection Time: 11/17/16  5:00 AM  Result Value Ref Range Status   MRSA by PCR NEGATIVE NEGATIVE Final     Comment:        The GeneXpert MRSA Assay (FDA approved for NASAL specimens only), is one component of a comprehensive MRSA colonization surveillance program. It is not intended to diagnose MRSA infection nor to guide or monitor treatment for MRSA infections.     Coagulation Studies: No results for input(s): LABPROT, INR in the last 72 hours.  Urinalysis:  Recent Labs Lab 11/16/16 2130  COLORURINE STRAW*  LABSPEC 1.008  PHURINE 5.0  GLUCOSEU NEGATIVE  HGBUR NEGATIVE  BILIRUBINUR NEGATIVE  KETONESUR NEGATIVE  PROTEINUR NEGATIVE  NITRITE NEGATIVE  LEUKOCYTESUR NEGATIVE    Lipid Panel:     Component Value Date/Time   CHOL 166 01/04/2014 0433   TRIG 79 01/04/2014 0433   HDL 30 (L) 01/04/2014 0433   VLDL 16 01/04/2014 0433   LDLCALC 120 (H) 01/04/2014 0433    HgbA1C:  Lab Results  Component Value Date   HGBA1C  11/25/2009    5.5 (NOTE)                                                                       According to the ADA Clinical Practice Recommendations for 2011, when HbA1c is used as a screening test:   >=6.5%   Diagnostic of Diabetes Mellitus           (if abnormal result  is confirmed)  5.7-6.4%   Increased risk of developing Diabetes Mellitus  References:Diagnosis and Classification of Diabetes Mellitus,Diabetes Care,2011,34(Suppl 1):S62-S69 and Standards of Medical Care in         Diabetes - 2011,Diabetes Care,2011,34  (Suppl 1):S11-S61.    Urine Drug Screen:     Component Value Date/Time   LABOPIA NONE DETECTED 11/24/2009 1915   COCAINSCRNUR NONE DETECTED 11/24/2009 1915   LABBENZ NONE DETECTED 11/24/2009 1915   AMPHETMU NONE DETECTED 11/24/2009 1915   THCU NONE DETECTED 11/24/2009 1915   LABBARB  11/24/2009 1915    NONE DETECTED        DRUG SCREEN FOR MEDICAL PURPOSES ONLY.  IF CONFIRMATION IS NEEDED FOR ANY PURPOSE, NOTIFY LAB WITHIN 5 DAYS.        LOWEST DETECTABLE LIMITS FOR URINE DRUG SCREEN Drug Class       Cutoff (ng/mL) Amphetamine  1000 Barbiturate      200 Benzodiazepine   200 Tricyclics       300 Opiates          300 Cocaine          300 THC              50    Alcohol Level: No results for input(s): ETH in the last 168 hours.   Imaging: Ct Angio Head W Or Wo Contrast  Result Date: 11/17/2016 CLINICAL DATA:  83 y/o  M; ataxia. EXAM: CT ANGIOGRAPHY HEAD AND NECK TECHNIQUE: Multidetector CT imaging of the head and neck was performed using the standard protocol during bolus administration of intravenous contrast. Multiplanar CT image reconstructions and MIPs were obtained to evaluate the vascular anatomy. Carotid stenosis measurements (when applicable) are obtained utilizing NASCET criteria, using the distal internal carotid diameter as the denominator. CONTRAST:  75 cc Isovue 370 COMPARISON:  11/16/16 Ct of the head. 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 Right carotid system: No evidence of dissection, stenosis (50% or greater) or occlusion. Severe mixed plaque of the bifurcation without significant stenosis. Left carotid system: No evidence of dissection, stenosis (50% or greater) or occlusion. Severe mixed plaque of the bifurcation with 30% proximal ICA stenosis. Vertebral arteries: Left dominant. Right vertebral artery origin severe stenosis. Skeleton: C5-6 grade 1 anterolisthesis. Severe cervical spondylosis greatest at the C3-4 level there is moderate bony canal stenosis. Other neck: Negative. Upper chest: Severe emphysema of the lung apices. Moderate scarring and bronchiectasis. Review of the MIP images confirms the above findings CTA HEAD FINDINGS Anterior circulation: No proximal occlusion or vascular malformation. Right ICA broad-based aneurysm superior directed near the terminus measuring 4 x 7 x 3 mm (7:109). Extensive calcific atherosclerosis of the carotid siphons with moderate-to-severe right  paraclinoid stenosis and moderate left. Posterior circulation: No significant stenosis, proximal occlusion, aneurysm, or vascular malformation. Venous sinuses: As permitted by contrast timing, patent. Anatomic variants: Bilateral fetal PCA. Small patent anterior communicating. Delayed phase: No abnormal intracranial enhancement. Review of the MIP images confirms the above findings IMPRESSION: 1. 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 Recommend annual imaging followup by CTA or MRA. This recommendation follows 2010 ACCF/AHA/AATS/ACR/ASA/SCA/SCAI/SIR/STS/SVM Guidelines for the Diagnosis and Management of Patients with Thoracic Aortic Disease. Circulation. 2010; 121: Z610-R604. 2. 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 3. 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 4. Right vertebral artery origin severe stenosis. 5. Severe cervical spondylosis greatest at the C3-4 level there is moderate bony canal stenosis. 6. No proximal occlusion or vascular malformation of the circle of Willis. 7. Right ICA broad-based aneurysm superior directed near the terminus measuring up to 7 mm. 8. Extensive calcific atherosclerosis of the carotid siphons with moderate-to-severe right paraclinoid stenosis and moderate left. Electronically Signed   By: Mitzi Hansen M.D.   On: 11/17/2016 00:03   Dg Chest 1 View  Result Date: 11/16/2016 CLINICAL DATA:  Dizziness and fall today. EXAM: CHEST 1 VIEW COMPARISON:  02/18/2016 and prior radiographs FINDINGS: Cardiomegaly again noted. There is no evidence of focal airspace disease, pulmonary edema, suspicious pulmonary nodule/mass, pleural effusion, or pneumothorax. No acute bony abnormalities are identified. IMPRESSION: No evidence of acute cardiopulmonary disease. Electronically Signed   By: Harmon Pier M.D.   On: 11/16/2016 21:42   Dg  Pelvis 1-2 Views  Result Date: 11/16/2016 CLINICAL DATA:   Fall with pelvic pain. EXAM: PELVIS - 1-2 VIEW COMPARISON:  None. FINDINGS: An intratrochanteric left femur fracture appears remote but correlate clinically. No other fracture, subluxation or dislocation identified. Degenerative changes in the lower lumbar spine and hips are noted. IMPRESSION: Intertrochanteric left femur fracture which appears remote but correlate clinically. Consider dedicated left hip films as indicated. Electronically Signed   By: Harmon PierJeffrey  Hu M.D.   On: 11/16/2016 21:46   Ct Head Wo Contrast  Result Date: 11/16/2016 CLINICAL DATA:  Dizziness. Weakness since this morning. Fall out of wheelchair. EXAM: CT HEAD WITHOUT CONTRAST TECHNIQUE: Contiguous axial images were obtained from the base of the skull through the vertex without intravenous contrast. COMPARISON:  Head CT and brain MRI 01/03/2014 FINDINGS: Brain: Similar degree of atrophy and chronic small vessel ischemia. Remote lacunar infarct in the right basal ganglia. No evidence of large vessel ischemia, mass effect, or midline shift. No hydrocephalus. No extra-axial or subdural fluid collection. Vascular: Atherosclerosis of skullbase vasculature without hyperdense vessel or abnormal calcification. Skull:  No fracture or focal lesion. Sinuses/Orbits: Chronic opacification of right mastoid air cells. Left mastoid air cells are well-aerated. Mucosal thickening in the left maxillary sinus with minimal debris. Remote left nasal bone fracture. Visualized orbits are unremarkable. Other: None. IMPRESSION: Similar atrophy, chronic small vessel ischemia and remote lacunar infarct in the right basal ganglia. No acute intracranial abnormality. Chronic opacification of right mastoid air cells. Minimal left maxillary sinus disease, possibly acute with intraluminal debris. Electronically Signed   By: Rubye OaksMelanie  Ehinger M.D.   On: 11/16/2016 22:11   Ct Angio Neck W And/or Wo Contrast  Result Date: 11/17/2016 CLINICAL DATA:  83 y/o  M; ataxia. EXAM: CT  ANGIOGRAPHY HEAD AND NECK TECHNIQUE: Multidetector CT imaging of the head and neck was performed using the standard protocol during bolus administration of intravenous contrast. Multiplanar CT image reconstructions and MIPs were obtained to evaluate the vascular anatomy. Carotid stenosis measurements (when applicable) are obtained utilizing NASCET criteria, using the distal internal carotid diameter as the denominator. CONTRAST:  75 cc Isovue 370 COMPARISON:  11/16/16 Ct of the head. 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 Right carotid system: No evidence of dissection, stenosis (50% or greater) or occlusion. Severe mixed plaque of the bifurcation without significant stenosis. Left carotid system: No evidence of dissection, stenosis (50% or greater) or occlusion. Severe mixed plaque of the bifurcation with 30% proximal ICA stenosis. Vertebral arteries: Left dominant. Right vertebral artery origin severe stenosis. Skeleton: C5-6 grade 1 anterolisthesis. Severe cervical spondylosis greatest at the C3-4 level there is moderate bony canal stenosis. Other neck: Negative. Upper chest: Severe emphysema of the lung apices. Moderate scarring and bronchiectasis. Review of the MIP images confirms the above findings CTA HEAD FINDINGS Anterior circulation: No proximal occlusion or vascular malformation. Right ICA broad-based aneurysm superior directed near the terminus measuring 4 x 7 x 3 mm (7:109). Extensive calcific atherosclerosis of the carotid siphons with moderate-to-severe right paraclinoid stenosis and moderate left. Posterior circulation: No significant stenosis, proximal occlusion, aneurysm, or vascular malformation. Venous sinuses: As permitted by contrast timing, patent. Anatomic variants: Bilateral fetal PCA. Small patent anterior communicating. Delayed phase: No abnormal intracranial  enhancement. Review of the MIP images confirms the above findings IMPRESSION: 1. 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 Recommend annual imaging followup by CTA or  MRA. This recommendation follows 2010 ACCF/AHA/AATS/ACR/ASA/SCA/SCAI/SIR/STS/SVM Guidelines for the Diagnosis and Management of Patients with Thoracic Aortic Disease. Circulation. 2010; 121: O962-X528. 2. 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 3. 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 4. Right vertebral artery origin severe stenosis. 5. Severe cervical spondylosis greatest at the C3-4 level there is moderate bony canal stenosis. 6. No proximal occlusion or vascular malformation of the circle of Willis. 7. Right ICA broad-based aneurysm superior directed near the terminus measuring up to 7 mm. 8. Extensive calcific atherosclerosis of the carotid siphons with moderate-to-severe right paraclinoid stenosis and moderate left. Electronically Signed   By: Mitzi Hansen M.D.   On: 11/17/2016 00:03   Mr Maxine Glenn Head Wo Contrast  Result Date: 11/17/2016 CLINICAL DATA:  Dizziness and giddiness. EXAM: MRI HEAD WITHOUT CONTRAST MRA HEAD WITHOUT CONTRAST TECHNIQUE: Multiplanar, multiecho pulse sequences of the brain and surrounding structures were obtained without intravenous contrast. Angiographic images of the head were obtained using MRA technique without contrast. COMPARISON:  CT head 11/16/2016.  CTA head and neck 11/16/2016 FINDINGS: MRI HEAD FINDINGS Image quality degraded by moderate motion. Brain: Negative for acute infarct. Chronic ischemic changes in the white matter. Large chronic lacunar infarction in the left pons. Small chronic infarcts in the right cerebellum. Negative for hemorrhage or mass. Generalized atrophy. Vascular: Normal arterial flow voids. Skull and upper cervical spine: Negative Sinuses/Orbits: Mild mucosal edema in the paranasal sinuses.  Normal orbit. Other: None MRA HEAD FINDINGS Image quality degraded by moderate motion. Both vertebral arteries are patent. The basilar is patent. Posterior cerebral arteries patent bilaterally. Fetal origin of the posterior cerebral artery bilaterally. Atherosclerotic irregularity and moderate stenosis in the cavernous carotid bilaterally. Mild to moderate stenosis of the right M1 segment. Right middle cerebral artery branches patent. Left middle cerebral artery widely patent. Anterior distal artery patent bilaterally. 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 IMPRESSION: Negative for acute infarct. Chronic ischemic change in the white matter and left pons MRA is significantly degraded by motion. No large vessel occlusion. Mild to moderate disease cavernous carotid bilaterally and mild stenosis right M1 segment. Right supraclinoid internal carotid artery aneurysm. See CTA report Electronically Signed   By: Marlan Palau M.D.   On: 11/17/2016 10:57   Mr Brain Wo Contrast  Result Date: 11/17/2016 CLINICAL DATA:  Dizziness and giddiness. EXAM: MRI HEAD WITHOUT CONTRAST MRA HEAD WITHOUT CONTRAST TECHNIQUE: Multiplanar, multiecho pulse sequences of the brain and surrounding structures were obtained without intravenous contrast. Angiographic images of the head were obtained using MRA technique without contrast. COMPARISON:  CT head 11/16/2016.  CTA head and neck 11/16/2016 FINDINGS: MRI HEAD FINDINGS Image quality degraded by moderate motion. Brain: Negative for acute infarct. Chronic ischemic changes in the white matter. Large chronic lacunar infarction in the left pons. Small chronic infarcts in the right cerebellum. Negative for hemorrhage or mass. Generalized atrophy. Vascular: Normal arterial flow voids. Skull and upper cervical spine: Negative Sinuses/Orbits: Mild mucosal edema in the paranasal sinuses. Normal orbit. Other: None MRA HEAD FINDINGS Image quality  degraded by moderate motion. Both vertebral arteries are patent. The basilar is patent. Posterior cerebral arteries patent bilaterally. Fetal origin of the posterior cerebral artery bilaterally. Atherosclerotic irregularity and moderate stenosis in the cavernous carotid bilaterally. Mild to moderate stenosis of the right M1 segment. Right middle cerebral artery branches patent. Left middle cerebral artery widely patent. Anterior  distal artery patent bilaterally. 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 IMPRESSION: Negative for acute infarct. Chronic ischemic change in the white matter and left pons MRA is significantly degraded by motion. No large vessel occlusion. Mild to moderate disease cavernous carotid bilaterally and mild stenosis right M1 segment. Right supraclinoid internal carotid artery aneurysm. See CTA report Electronically Signed   By: Marlan Palau M.D.   On: 11/17/2016 10:57   Dg Femur Min 2 Views Left  Result Date: 11/16/2016 CLINICAL DATA:  Pain after fall today EXAM: LEFT FEMUR 2 VIEWS COMPARISON:  None. FINDINGS: The knee is flexed on all the images limiting assessment. There is femorotibial joint space narrowing. Likewise there is joint space narrowing of the left hip. No acute displaced fracture nor dislocation is noted. Vascular calcifications are present. 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 No acute fracture nor dislocations. Electronically Signed   By: Tollie Eth M.D.   On: 11/16/2016 22:59     Assessment/Plan: 83 year old male with presenting complaints dizziness.  Currently patient does not admit to those complaints and is only minimally cooperative with a neurological examination.  MRI of the brain shows no acute changes.  Patient on ASA and Plavix.  Vascular and Neurosurgery to further evaluate aneurysms as an outpatient.    No further neurologic intervention is recommended at this time.  If further questions  arise, please call or page at that time.  Thank you for allowing neurology to participate in the care of this patient.   Thana Farr, MD Neurology [phone] 11/17/2016, 12:44 PM

## 2016-11-17 NOTE — Consult Note (Signed)
Reason for Consult:Dizziness and gait instability Referring Physician: Allena Katz  CC: Dizziness  HPI: [name] Horsfall Sr. is an 83 y.o. male who lives in a facility.  It appears that he is wheelchair bound.  Patient does not provide much in the way of history.  From the chart it seems that his initial complaint was that of "feeling drunk".  He has also had falls recently.  On the day of presentation he fell from his wheelchair while going downhill.  Patient admits to falls but will give no details.  He does not admit to feelings of dizziness, lightheadedness or vertigo.    Past Medical History:  Diagnosis Date  . Arthritis   . Gout   . Hypertension   . Stroke [facility])    left sided weakness    Past Surgical History:  Procedure Laterality Date  . INGUINAL HERNIA REPAIR Right 09/21/2015   Procedure: HERNIA REPAIR INGUINAL ADULT;  Surgeon: [name], MD;  Location: [facility] ORS;  Service: General;  Laterality: Right;  . LOWER EXTREMITY ANGIOGRAPHY Left 09/30/2016   Procedure: Lower Extremity Angiography;  Surgeon: [name], MD;  Location: [facility] INVASIVE CV LAB;  Service: Cardiovascular;  Laterality: Left;    Family History  Problem Relation Age of Onset  . Kidney disease Neg Hx   . Prostate cancer Neg Hx     Social History:  reports that he has been smoking Cigarettes.  He has a 50.00 pack-year smoking history. He has never used smokeless tobacco. He reports that he does not drink alcohol or use drugs.  Allergies  Allergen Reactions  . Hydrochlorothiazide     Documented on MAR  . Lisinopril     Documented on MAR    Medications:  I have reviewed the patient's current medications. Prior to Admission:  Prescriptions Prior to Admission  Medication Sig Dispense Refill Last Dose  . acetaminophen (TYLENOL) 325 MG tablet Take 650 mg by mouth every 6 (six) hours as needed for moderate pain.   11/16/2016 at Unknown time  . albuterol (PROVENTIL HFA;VENTOLIN HFA) 108 (90 Base)  MCG/ACT inhaler Inhale 2 puffs into the lungs every 6 (six) hours as needed for wheezing or shortness of breath. 1 Inhaler 2 prn  . amLODipine (NORVASC) 5 MG tablet Take 5 mg by mouth daily.   11/16/2016 at Unknown time  . aspirin EC 81 MG tablet Take 81 mg by mouth daily.   11/16/2016 at Unknown time  . clopidogrel (PLAVIX) 75 MG tablet Take 1 tablet (75 mg total) by mouth daily. 30 tablet 11 11/16/2016 at Unknown time  . docusate sodium (COLACE) 100 MG capsule Take 100 mg by mouth 2 (two) times daily.   11/16/2016 at Unknown time  . DULoxetine (CYMBALTA) 30 MG capsule Take 30 mg by mouth daily.   11/16/2016 at Unknown time  . febuxostat (ULORIC) 40 MG tablet Take 40 mg by mouth daily.   11/16/2016 at Unknown time  . hypromellose (GENTEAL SEVERE) 0.3 % GEL ophthalmic ointment Place 1 application into the left eye as needed (irritation/watering).    prn  . ipratropium (ATROVENT) 0.03 % nasal spray Place 1 spray into both nostrils every 4 (four) hours as needed (congestion).    prn  . loratadine (CLARITIN) 10 MG tablet Take 10 mg by mouth every evening.    11/16/2016 at Unknown time  . lovastatin (MEVACOR) 20 MG tablet Take 20 mg by mouth every evening.   11/16/2016 at Unknown time  . meloxicam (MOBIC) 7.5 MG  tablet Take 7.5 mg by mouth every evening.    11/16/2016 at Unknown time  . tamsulosin (FLOMAX) 0.4 MG CAPS capsule Take 1 capsule (0.4 mg total) by mouth daily. 90 capsule 3 11/16/2016 at Unknown time   Scheduled: . amLODipine  5 mg Oral Daily  . aspirin EC  81 mg Oral Daily  . clopidogrel  75 mg Oral Daily  . docusate sodium  100 mg Oral BID  . enoxaparin (LOVENOX) injection  40 mg Subcutaneous Q24H  . febuxostat  40 mg Oral Daily  . loratadine  10 mg Oral QPM  . pravastatin  20 mg Oral q1800  . sodium chloride flush  3 mL Intravenous Q12H  . tamsulosin  0.4 mg Oral Daily    ROS: History obtained from the patient  General ROS: negative for - chills, fatigue, fever, night sweats, weight gain  or weight loss Psychological ROS: negative for - behavioral disorder, hallucinations, memory difficulties, mood swings or suicidal ideation Ophthalmic ROS: negative for - blurry vision, double vision, eye pain or loss of vision ENT ROS: negative for - epistaxis, nasal discharge, oral lesions, sore throat, tinnitus or vertigo Allergy and Immunology ROS: negative for - hives or itchy/watery eyes Hematological and Lymphatic ROS: negative for - bleeding problems, bruising or swollen lymph nodes Endocrine ROS: negative for - galactorrhea, hair pattern changes, polydipsia/polyuria or temperature intolerance Respiratory ROS: negative for - cough, hemoptysis, shortness of breath or wheezing Cardiovascular ROS: negative for - chest pain, dyspnea on exertion, edema or irregular heartbeat Gastrointestinal ROS: negative for - abdominal pain, diarrhea, hematemesis, nausea/vomiting or stool incontinence Genito-Urinary ROS: negative for - dysuria, hematuria, incontinence or urinary frequency/urgency Musculoskeletal ROS: leg pain Neurological ROS: as noted in HPI Dermatological ROS: negative for rash and skin lesion changes  Physical Examination: Blood pressure (!) 172/92, pulse (!) 101, temperature 98.7 F (37.1 C), temperature source Oral, resp. rate 16, weight 61.4 kg (135 lb 6.4 oz), SpO2 97 %.  HEENT-  Normocephalic, no lesions, without obvious abnormality.  Normal external eye and conjunctiva.  Normal TM's bilaterally.  Normal auditory canals and external ears. Normal external nose, mucus membranes and septum.  Normal pharynx. Cardiovascular- S1, S2 normal, pulses palpable throughout   Lungs- chest clear, no wheezing, rales, normal symmetric air entry Abdomen- soft, non-tender; bowel sounds normal; no masses,  no organomegaly Extremities- pain on light palpation of legs Lymph-no adenopathy palpable Musculoskeletal-muscle tenderness in the lower extremities Skin-heel breakdown  Neurological  Examination   Mental Status: Patient lies in a ball with eyes closed.  Speech is fluent.  Follows commands. Patient not particularly cooperative.  Cranial Nerves: II: Discs flat bilaterally; Visual fields grossly normal, pupils equal, round, reactive to light and accommodation III,IV, VI: ptosis not present, extra-ocular motions intact bilaterally V,VII: face appears symmetric, facial light touch sensation normal bilaterally VIII: hearing normal bilaterally IX,X: gag reflex present XI: bilateral shoulder shrug XII: midline tongue extension Motor: Moves both upper extremities with strong 5/5 hand grip bilaterally.  Patient will not move his lower extremities due to complaints of pain Sensory: Sensation intact throughout, bilaterally Deep Tendon Reflexes: Unable to test due to patient's complaints of pain Plantars: Right: upgoing   Left: downgoing Cerebellar: Unable to test due to patient's cooperation Gait: not tested due to safety concerns    Laboratory Studies:   Basic Metabolic Panel:  Recent Labs Lab 11/16/16 2102  NA 141  K 3.8  CL 108  CO2 25  GLUCOSE 93  BUN 21*  CREATININE  1.18  CALCIUM 9.4    Liver Function Tests:  Recent Labs Lab 11/16/16 2102  AST 36  ALT 20  ALKPHOS 89  BILITOT 0.4  PROT 7.8  ALBUMIN 3.4*   No results for input(s): LIPASE, AMYLASE in the last 168 hours.  Recent Labs Lab 11/16/16 2102  AMMONIA <9*    CBC:  Recent Labs Lab 11/16/16 2102  WBC 7.1  NEUTROABS 3.7  HGB 12.0*  HCT 35.4*  MCV 90.3  PLT 291    Cardiac Enzymes:  Recent Labs Lab 11/16/16 2102  TROPONINI <0.03    BNP: Invalid input(s): POCBNP  CBG: No results for input(s): GLUCAP in the last 168 hours.  Microbiology: Results for orders placed or performed during the hospital encounter of 11/16/16  MRSA PCR Screening     Status: None   Collection Time: 11/17/16  5:00 AM  Result Value Ref Range Status   MRSA by PCR NEGATIVE NEGATIVE Final     Comment:        The GeneXpert MRSA Assay (FDA approved for NASAL specimens only), is one component of a comprehensive MRSA colonization surveillance program. It is not intended to diagnose MRSA infection nor to guide or monitor treatment for MRSA infections.     Coagulation Studies: No results for input(s): LABPROT, INR in the last 72 hours.  Urinalysis:  Recent Labs Lab 11/16/16 2130  COLORURINE STRAW*  LABSPEC 1.008  PHURINE 5.0  GLUCOSEU NEGATIVE  HGBUR NEGATIVE  BILIRUBINUR NEGATIVE  KETONESUR NEGATIVE  PROTEINUR NEGATIVE  NITRITE NEGATIVE  LEUKOCYTESUR NEGATIVE    Lipid Panel:     Component Value Date/Time   CHOL 166 01/04/2014 0433   TRIG 79 01/04/2014 0433   HDL 30 (L) 01/04/2014 0433   VLDL 16 01/04/2014 0433   LDLCALC 120 (H) 01/04/2014 0433    HgbA1C:  Lab Results  Component Value Date   HGBA1C  11/25/2009    5.5 (NOTE)                                                                       According to the ADA Clinical Practice Recommendations for 2011, when HbA1c is used as a screening test:   >=6.5%   Diagnostic of Diabetes Mellitus           (if abnormal result  is confirmed)  5.7-6.4%   Increased risk of developing Diabetes Mellitus  References:Diagnosis and Classification of Diabetes Mellitus,Diabetes Care,2011,34(Suppl 1):S62-S69 and Standards of Medical Care in         Diabetes - 2011,Diabetes Care,2011,34  (Suppl 1):S11-S61.    Urine Drug Screen:     Component Value Date/Time   LABOPIA NONE DETECTED 11/24/2009 1915   COCAINSCRNUR NONE DETECTED 11/24/2009 1915   LABBENZ NONE DETECTED 11/24/2009 1915   AMPHETMU NONE DETECTED 11/24/2009 1915   THCU NONE DETECTED 11/24/2009 1915   LABBARB  11/24/2009 1915    NONE DETECTED        DRUG SCREEN FOR MEDICAL PURPOSES ONLY.  IF CONFIRMATION IS NEEDED FOR ANY PURPOSE, NOTIFY LAB WITHIN 5 DAYS.        LOWEST DETECTABLE LIMITS FOR URINE DRUG SCREEN Drug Class       Cutoff (ng/mL) Amphetamine  1000 Barbiturate      200 Benzodiazepine   200 Tricyclics       300 Opiates          300 Cocaine          300 THC              50    Alcohol Level: No results for input(s): ETH in the last 168 hours.   Imaging: Ct Angio Head W Or Wo Contrast  Result Date: 11/17/2016 CLINICAL DATA:  83 y/o  M; ataxia. EXAM: CT ANGIOGRAPHY HEAD AND NECK TECHNIQUE: Multidetector CT imaging of the head and neck was performed using the standard protocol during bolus administration of intravenous contrast. Multiplanar CT image reconstructions and MIPs were obtained to evaluate the vascular anatomy. Carotid stenosis measurements (when applicable) are obtained utilizing NASCET criteria, using the distal internal carotid diameter as the denominator. CONTRAST:  75 cc Isovue 370 COMPARISON:  11/16/16 Ct of the head. 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 Right carotid system: No evidence of dissection, stenosis (50% or greater) or occlusion. Severe mixed plaque of the bifurcation without significant stenosis. Left carotid system: No evidence of dissection, stenosis (50% or greater) or occlusion. Severe mixed plaque of the bifurcation with 30% proximal ICA stenosis. Vertebral arteries: Left dominant. Right vertebral artery origin severe stenosis. Skeleton: C5-6 grade 1 anterolisthesis. Severe cervical spondylosis greatest at the C3-4 level there is moderate bony canal stenosis. Other neck: Negative. Upper chest: Severe emphysema of the lung apices. Moderate scarring and bronchiectasis. Review of the MIP images confirms the above findings CTA HEAD FINDINGS Anterior circulation: No proximal occlusion or vascular malformation. Right ICA broad-based aneurysm superior directed near the terminus measuring 4 x 7 x 3 mm (7:109). Extensive calcific atherosclerosis of the carotid siphons with moderate-to-severe right  paraclinoid stenosis and moderate left. Posterior circulation: No significant stenosis, proximal occlusion, aneurysm, or vascular malformation. Venous sinuses: As permitted by contrast timing, patent. Anatomic variants: Bilateral fetal PCA. Small patent anterior communicating. Delayed phase: No abnormal intracranial enhancement. Review of the MIP images confirms the above findings IMPRESSION: 1. 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 Recommend annual imaging followup by CTA or MRA. This recommendation follows 2010 ACCF/AHA/AATS/ACR/ASA/SCA/SCAI/SIR/STS/SVM Guidelines for the Diagnosis and Management of Patients with Thoracic Aortic Disease. Circulation. 2010; 121: Z610-R604. 2. 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 3. 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 4. Right vertebral artery origin severe stenosis. 5. Severe cervical spondylosis greatest at the C3-4 level there is moderate bony canal stenosis. 6. No proximal occlusion or vascular malformation of the circle of Willis. 7. Right ICA broad-based aneurysm superior directed near the terminus measuring up to 7 mm. 8. Extensive calcific atherosclerosis of the carotid siphons with moderate-to-severe right paraclinoid stenosis and moderate left. Electronically Signed   By: Mitzi Hansen M.D.   On: 11/17/2016 00:03   Dg Chest 1 View  Result Date: 11/16/2016 CLINICAL DATA:  Dizziness and fall today. EXAM: CHEST 1 VIEW COMPARISON:  02/18/2016 and prior radiographs FINDINGS: Cardiomegaly again noted. There is no evidence of focal airspace disease, pulmonary edema, suspicious pulmonary nodule/mass, pleural effusion, or pneumothorax. No acute bony abnormalities are identified. IMPRESSION: No evidence of acute cardiopulmonary disease. Electronically Signed   By: Harmon Pier M.D.   On: 11/16/2016 21:42   Dg  Pelvis 1-2 Views  Result Date: 11/16/2016 CLINICAL DATA:   Fall with pelvic pain. EXAM: PELVIS - 1-2 VIEW COMPARISON:  None. FINDINGS: An intratrochanteric left femur fracture appears remote but correlate clinically. No other fracture, subluxation or dislocation identified. Degenerative changes in the lower lumbar spine and hips are noted. IMPRESSION: Intertrochanteric left femur fracture which appears remote but correlate clinically. Consider dedicated left hip films as indicated. Electronically Signed   By: Harmon PierJeffrey  Hu M.D.   On: 11/16/2016 21:46   Ct Head Wo Contrast  Result Date: 11/16/2016 CLINICAL DATA:  Dizziness. Weakness since this morning. Fall out of wheelchair. EXAM: CT HEAD WITHOUT CONTRAST TECHNIQUE: Contiguous axial images were obtained from the base of the skull through the vertex without intravenous contrast. COMPARISON:  Head CT and brain MRI 01/03/2014 FINDINGS: Brain: Similar degree of atrophy and chronic small vessel ischemia. Remote lacunar infarct in the right basal ganglia. No evidence of large vessel ischemia, mass effect, or midline shift. No hydrocephalus. No extra-axial or subdural fluid collection. Vascular: Atherosclerosis of skullbase vasculature without hyperdense vessel or abnormal calcification. Skull:  No fracture or focal lesion. Sinuses/Orbits: Chronic opacification of right mastoid air cells. Left mastoid air cells are well-aerated. Mucosal thickening in the left maxillary sinus with minimal debris. Remote left nasal bone fracture. Visualized orbits are unremarkable. Other: None. IMPRESSION: Similar atrophy, chronic small vessel ischemia and remote lacunar infarct in the right basal ganglia. No acute intracranial abnormality. Chronic opacification of right mastoid air cells. Minimal left maxillary sinus disease, possibly acute with intraluminal debris. Electronically Signed   By: Rubye OaksMelanie  Ehinger M.D.   On: 11/16/2016 22:11   Ct Angio Neck W And/or Wo Contrast  Result Date: 11/17/2016 CLINICAL DATA:  83 y/o  M; ataxia. EXAM: CT  ANGIOGRAPHY HEAD AND NECK TECHNIQUE: Multidetector CT imaging of the head and neck was performed using the standard protocol during bolus administration of intravenous contrast. Multiplanar CT image reconstructions and MIPs were obtained to evaluate the vascular anatomy. Carotid stenosis measurements (when applicable) are obtained utilizing NASCET criteria, using the distal internal carotid diameter as the denominator. CONTRAST:  75 cc Isovue 370 COMPARISON:  11/16/16 Ct of the head. 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 Right carotid system: No evidence of dissection, stenosis (50% or greater) or occlusion. Severe mixed plaque of the bifurcation without significant stenosis. Left carotid system: No evidence of dissection, stenosis (50% or greater) or occlusion. Severe mixed plaque of the bifurcation with 30% proximal ICA stenosis. Vertebral arteries: Left dominant. Right vertebral artery origin severe stenosis. Skeleton: C5-6 grade 1 anterolisthesis. Severe cervical spondylosis greatest at the C3-4 level there is moderate bony canal stenosis. Other neck: Negative. Upper chest: Severe emphysema of the lung apices. Moderate scarring and bronchiectasis. Review of the MIP images confirms the above findings CTA HEAD FINDINGS Anterior circulation: No proximal occlusion or vascular malformation. Right ICA broad-based aneurysm superior directed near the terminus measuring 4 x 7 x 3 mm (7:109). Extensive calcific atherosclerosis of the carotid siphons with moderate-to-severe right paraclinoid stenosis and moderate left. Posterior circulation: No significant stenosis, proximal occlusion, aneurysm, or vascular malformation. Venous sinuses: As permitted by contrast timing, patent. Anatomic variants: Bilateral fetal PCA. Small patent anterior communicating. Delayed phase: No abnormal intracranial  enhancement. Review of the MIP images confirms the above findings IMPRESSION: 1. 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 Recommend annual imaging followup by CTA or  MRA. This recommendation follows 2010 ACCF/AHA/AATS/ACR/ASA/SCA/SCAI/SIR/STS/SVM Guidelines for the Diagnosis and Management of Patients with Thoracic Aortic Disease. Circulation. 2010; 121: O962-X528. 2. 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 3. 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 4. Right vertebral artery origin severe stenosis. 5. Severe cervical spondylosis greatest at the C3-4 level there is moderate bony canal stenosis. 6. No proximal occlusion or vascular malformation of the circle of Willis. 7. Right ICA broad-based aneurysm superior directed near the terminus measuring up to 7 mm. 8. Extensive calcific atherosclerosis of the carotid siphons with moderate-to-severe right paraclinoid stenosis and moderate left. Electronically Signed   By: Mitzi Hansen M.D.   On: 11/17/2016 00:03   Mr Maxine Glenn Head Wo Contrast  Result Date: 11/17/2016 CLINICAL DATA:  Dizziness and giddiness. EXAM: MRI HEAD WITHOUT CONTRAST MRA HEAD WITHOUT CONTRAST TECHNIQUE: Multiplanar, multiecho pulse sequences of the brain and surrounding structures were obtained without intravenous contrast. Angiographic images of the head were obtained using MRA technique without contrast. COMPARISON:  CT head 11/16/2016.  CTA head and neck 11/16/2016 FINDINGS: MRI HEAD FINDINGS Image quality degraded by moderate motion. Brain: Negative for acute infarct. Chronic ischemic changes in the white matter. Large chronic lacunar infarction in the left pons. Small chronic infarcts in the right cerebellum. Negative for hemorrhage or mass. Generalized atrophy. Vascular: Normal arterial flow voids. Skull and upper cervical spine: Negative Sinuses/Orbits: Mild mucosal edema in the paranasal sinuses.  Normal orbit. Other: None MRA HEAD FINDINGS Image quality degraded by moderate motion. Both vertebral arteries are patent. The basilar is patent. Posterior cerebral arteries patent bilaterally. Fetal origin of the posterior cerebral artery bilaterally. Atherosclerotic irregularity and moderate stenosis in the cavernous carotid bilaterally. Mild to moderate stenosis of the right M1 segment. Right middle cerebral artery branches patent. Left middle cerebral artery widely patent. Anterior distal artery patent bilaterally. 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 IMPRESSION: Negative for acute infarct. Chronic ischemic change in the white matter and left pons MRA is significantly degraded by motion. No large vessel occlusion. Mild to moderate disease cavernous carotid bilaterally and mild stenosis right M1 segment. Right supraclinoid internal carotid artery aneurysm. See CTA report Electronically Signed   By: Marlan Palau M.D.   On: 11/17/2016 10:57   Mr Brain Wo Contrast  Result Date: 11/17/2016 CLINICAL DATA:  Dizziness and giddiness. EXAM: MRI HEAD WITHOUT CONTRAST MRA HEAD WITHOUT CONTRAST TECHNIQUE: Multiplanar, multiecho pulse sequences of the brain and surrounding structures were obtained without intravenous contrast. Angiographic images of the head were obtained using MRA technique without contrast. COMPARISON:  CT head 11/16/2016.  CTA head and neck 11/16/2016 FINDINGS: MRI HEAD FINDINGS Image quality degraded by moderate motion. Brain: Negative for acute infarct. Chronic ischemic changes in the white matter. Large chronic lacunar infarction in the left pons. Small chronic infarcts in the right cerebellum. Negative for hemorrhage or mass. Generalized atrophy. Vascular: Normal arterial flow voids. Skull and upper cervical spine: Negative Sinuses/Orbits: Mild mucosal edema in the paranasal sinuses. Normal orbit. Other: None MRA HEAD FINDINGS Image quality  degraded by moderate motion. Both vertebral arteries are patent. The basilar is patent. Posterior cerebral arteries patent bilaterally. Fetal origin of the posterior cerebral artery bilaterally. Atherosclerotic irregularity and moderate stenosis in the cavernous carotid bilaterally. Mild to moderate stenosis of the right M1 segment. Right middle cerebral artery branches patent. Left middle cerebral artery widely patent. Anterior  distal artery patent bilaterally. 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 IMPRESSION: Negative for acute infarct. Chronic ischemic change in the white matter and left pons MRA is significantly degraded by motion. No large vessel occlusion. Mild to moderate disease cavernous carotid bilaterally and mild stenosis right M1 segment. Right supraclinoid internal carotid artery aneurysm. See CTA report Electronically Signed   By: Marlan Palau M.D.   On: 11/17/2016 10:57   Dg Femur Min 2 Views Left  Result Date: 11/16/2016 CLINICAL DATA:  Pain after fall today EXAM: LEFT FEMUR 2 VIEWS COMPARISON:  None. FINDINGS: The knee is flexed on all the images limiting assessment. There is femorotibial joint space narrowing. Likewise there is joint space narrowing of the left hip. No acute displaced fracture nor dislocation is noted. Vascular calcifications are present. 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 No acute fracture nor dislocations. Electronically Signed   By: Tollie Eth M.D.   On: 11/16/2016 22:59     Assessment/Plan: 83 year old male with presenting complaints dizziness.  Currently patient does not admit to those complaints and is only minimally cooperative with a neurological examination.  MRI of the brain shows no acute changes.  Patient on ASA and Plavix.  Vascular and Neurosurgery to further evaluate aneurysms as an outpatient.    No further neurologic intervention is recommended at this time.  If further questions  arise, please call or page at that time.  Thank you for allowing neurology to participate in the care of this patient.   Thana Farr, MD Neurology [phone] 11/17/2016, 12:44 PM

## 2016-11-17 NOTE — ED Provider Notes (Signed)
-----------------------------------------   12:28 AM on 11/17/2016 -----------------------------------------   Blood pressure (!) 172/82, pulse (!) 102, temperature 98.1 F (36.7 C), temperature source Oral, resp. rate 18, SpO2 91 %.  Assuming care from Dr. Pershing ProudSchaevitz.  In short, [name] Guster Sr. is a 83 y.o. male with a chief complaint of Weakness and Fall .  Refer to the original H&P for additional details.  The current plan of care is to follow up the result of the CT scan and disposition the patient.   Clinical Course as of Nov 18 26  Mon Nov 17, 2016  0021 1. 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 Recommend annual imaging followup by CTA or MRA. This recommendation follows 2010 ACCF/AHA/AATS/ACR/ASA/SCA/SCAI/SIR/STS/SVM Guidelines for the Diagnosis and Management of Patients with Thoracic Aortic Disease. Circulation. 2010; 121: [id number]. 2. 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 3. 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 4. Right vertebral artery origin severe stenosis. 5. Severe cervical spondylosis greatest at the C3-4 level there is moderate bony canal stenosis. 6. No proximal occlusion or vascular malformation of the circle of Willis. 7. Right ICA broad-based aneurysm superior directed near the terminus measuring up to 7 mm. 8. Extensive calcific atherosclerosis of the carotid siphons with moderate-to-severe right paraclinoid stenosis and moderate left.   CT ANGIO HEAD W OR WO CONTRAST [AW]    Clinical Course User Index [AW] Rebecka ApleyWebster, Allison P, MD   I contacted [facility] regarding the patient's aneurysm. I spoke to Dr.Hauck who did not feel that the aneurysm was large enough to be causing the patient's symptoms. He reports that he is willing to see the patient in the office but felt the patient did not need emergent intervention. I also contacted our hospitalist to  admit the patient but she did want me to speak with the vascular surgeon. I contacted Dr. Gilda CreaseSchnier who felt that the dissection was not significant and also was not concerned about the severe stenosis. I did discuss my concern for posterior circulation insufficiency and he reports that that is still to be evaluated but nothing needed to be done emergently. The patient will be admitted to the hospitalist service for further evaluation.    Rebecka ApleyWebster, Allison P, MD 11/17/16 [phone]

## 2016-11-17 NOTE — Care Management (Signed)
Admitted to [facility] with the diagnosis of gait instability. Lives at [facility] for 6-12 months. Prior to [facility] at [facility] alone.  Son is Edison InternationalLinnies Wist Jr. [phone]). Spoke with son at the bedside. Legal Guardian is Cleophas Dunkerngela Riley ([phone]) since 12/2015.  No home Health No skilled facility that son is aware of. Uses an electric wheelchair to aid in getting around. Mr. Rainey PinesLinnies is observation status, will call Cleophas DunkerAngela Riley SW at the [employer] to let her know about observation status. Gwenette GreetBrenda S Rajanee Schuelke RN MSN CCM Care Management [phone]

## 2016-11-17 NOTE — Progress Notes (Signed)
Initial Nutrition Assessment  DOCUMENTATION CODES:   Not applicable  INTERVENTION:  Recommend Ensure Enlive po TID, each supplement provides 350 kcal and 20 grams of protein. Per son patient enjoys vanilla and chocolate.  Recommend Magic cup BID with lunch and dinner, each supplement provides 290 kcal and 9 grams of protein.   Recommend multivitamin with minerals daily.  NUTRITION DIAGNOSIS:   Inadequate oral intake related to lethargy/confusion as evidenced by per patient/family report, meal completion < 25%.  GOAL:   Patient will meet greater than or equal to 90% of their needs  MONITOR:   PO intake, Supplement acceptance, Labs, Weight trends, I & O's  REASON FOR ASSESSMENT:   Consult Poor PO  ASSESSMENT:   83 year old male with PMHx of HTN, stroke with left-sided weakness, arthritis, gout presented with dizziness and fall from wheelchair found to have left femoral intertrochanteric fracture.   -No acute changes per MRI/MRA brain.  -Patient was placed on dysphagia 2 diet with thin liquids after SLP evaluation.  Patient sleeping during assessment. Able to speak with son at bedside. Son reports he is unsure how long patient has had a poor appetite. He is not sure how he eats at the group home. Reports that when he cared for patient he typically ate well, but unable to provide any further details. Endorses patient is wheel-chair bound. Reports he is missing teeth and does not have any dentures. Denies any difficulty chewing/swallowing. Son reports that when patient does not feel well he sleeps most of the day. Per chart had 0% of lunch today.  Son unsure of weight history, but reports he does not believe he has lost any weight. Per chart current body weight is higher than previous weights in chart. Had previously been 120-130 lbs in 2017.  Medications reviewed and include: Colace.  Labs reviewed: BUN 21.   Nutrition-Focused physical exam completed. Findings are  mild-moderate fat depletion, mild-moderate muscle depletion, and mild edema. As patient is wheel-chair bound some level of muscle wasting expected.  Discussed with SLP. [facility] is from [facility]. He is missing teeth. Wheel-chair bound.  Diet Order:  DIET DYS 2 Room service appropriate? Yes with Assist; Fluid consistency: Thin  Skin:  Wound (see comment) (MSAD to groin, wounds to foot and ankle)  Last BM:  Unknown  Height:   Ht Readings from Last 1 Encounters:  09/30/16 5\' 2"  (1.575 m)    Weight:   Wt Readings from Last 1 Encounters:  11/17/16 135 lb 6.4 oz (61.4 kg)    Ideal Body Weight:  53.6 kg (calculated w/ height of 5'2" from previous admission)  BMI:  Body mass index is 24.76 kg/m.  Estimated Nutritional Needs:   Kcal:  1450-1695 (MSJ x 1.2-1.4)  Protein:  72-85 grams (1.2-1.4 grams/kg)  Fluid:  1.5 L/day (25 ml/kg)  EDUCATION NEEDS:   No education needs identified at this time  Helane RimaLeanne Ceceilia Cephus, MS, RD, LDN Pager: [phone] After Hours Pager: [phone]

## 2016-11-17 NOTE — Evaluation (Addendum)
Physical Therapy Evaluation Patient Details Name: [name] Hoecker Sr. MRN: [id number] DOB: [birth year] Today's Date: 11/17/2016   History of Present Illness  Pt is an 83 y.o. male presenting to hospital s/p fall out of w/c going downhill; pt also c/o intermittent B UE ataxia and weakness, dizziness.  MRI negative for acute infarct but did show remote lacunar infarct R basal ganglia.  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  Of note, imaging also showing intertrochanteric L femur fx (per imaging appears remote).  PMH includes h/o stroke with L sided weakness, PAD, and htn.  Pt also with wounds R malleolus and lateral foot.  Clinical Impression  Prior to hospital admission, pt was modified independent with bed mobility and transfers w/c level (pt uses power w/c).  Pt lives at [facility] for past 6-7 months and has been w/c bound for past 10 years.  Currently pt requires 2 person assist with bed mobility supine to/from sit; attempted lateral scoot on edge of bed, but pt appearing too fatigued to perform when attempted and pt wanting to lay back down in bed instead.  Pt would benefit from skilled PT to address noted impairments and functional limitations (see below for any additional details).  Upon hospital discharge, recommend pt discharge to [facility].    Follow Up Recommendations SNF    Equipment Recommendations  Other (comment) (pt already owns power w/c)    Recommendations for Other Services       Precautions / Restrictions Precautions Precautions: Fall Restrictions Weight Bearing Restrictions: Yes LLE Weight Bearing: Weight bearing as tolerated Other Position/Activity Restrictions: Per verbal discussion with MD Allena Katz 11/17/16 (regarding imaging results for remote appearing intertrochanteric L femur fx), pt WBAT.      Mobility  Bed Mobility Overal  bed mobility: Needs Assistance Bed Mobility: Supine to Sit;Sit to Supine     Supine to sit: Mod assist;Max assist;+2 for physical assistance;HOB elevated Sit to supine: Mod assist;Max assist;+2 for safety/equipment;HOB elevated   General bed mobility comments: assist for trunk and B LE's  Transfers                 General transfer comment: deferred d/t significant assist required for bed mobility  Ambulation/Gait             General Gait Details: Not appropriate (pt w/c level baseline)  Stairs            Wheelchair Mobility    Modified Rankin (Stroke Patients Only)       Balance Overall balance assessment: Needs assistance Sitting-balance support: Bilateral upper extremity supported;Feet unsupported Sitting balance-Leahy Scale: Poor Sitting balance - Comments: intermittent anterior L lean requiring min to mod assist for correction with vc's (pt otherwise CGA to close SBA).  Pt sat edge of bed approximately 10 minutes while SLP was present assessing pt's swallowing.  Increasing forward lean/trunk flexion noted with fatigue.                                     Pertinent Vitals/Pain Pain Assessment: Faces Faces Pain Scale: Hurts a little bit Pain Location: L ankle Pain Descriptors / Indicators: Sore Pain Intervention(s): Limited activity within patient's tolerance;Monitored during session;Repositioned    Home Living Family/patient expects to be discharged to:: Other (Comment)  Additional Comments: [facility] (residence last 6-7 months)    Prior Function Level of Independence: Independent with assistive device(s)         Comments: Pt and pt's son report pt was modified independent with bed mobility and transfers (uses power w/c); pt has been w/c bound x10 years and per notes has had chronic L knee pain about 3 months.     Hand Dominance        Extremity/Trunk Assessment   Upper Extremity Assessment Upper  Extremity Assessment: Generalized weakness    Lower Extremity Assessment Lower Extremity Assessment: RLE deficits/detail;LLE deficits/detail RLE Deficits / Details: R LE ROM WFL; difficult to assess strength d/t pt's impaired ability to follow instructions LLE Deficits / Details: L LE hip and knee significantly flexed in bed upon PT entering room (pt and pt's son reports he normally doesn't do this) and difficult to straighten less than 90 degrees hip flexion and knee flexion.       Communication   Communication: No difficulties  Cognition Arousal/Alertness: Awake/alert (Pt initially sleeping upon PT arrival but woke with vc's and tactile cues) Behavior During Therapy: Flat affect Overall Cognitive Status:  (Oriented to person, place, and situation (pt reports he does not keep track of date/time))                                        General Comments General comments (skin integrity, edema, etc.): Pt's son present during session.  MD Allena Katz cleared PT to see pt (per verbal discussion with MD prior to PT eval).  Nursing cleared pt for participation in physical therapy.  Pt and pt's son agreeable to PT session.     Exercises     Assessment/Plan    PT Assessment Patient needs continued PT services  PT Problem List Decreased strength;Decreased balance;Decreased mobility;Decreased knowledge of precautions       PT Treatment Interventions DME instruction;Functional mobility training;Therapeutic activities;Therapeutic exercise;Balance training;Patient/family education    PT Goals (Current goals can be found in the Care Plan section)  Acute Rehab PT Goals Patient Stated Goal: to be stronger PT Goal Formulation: With patient Time For Goal Achievement: 12/01/16 Potential to Achieve Goals: Fair    Frequency Min 2X/week   Barriers to discharge Other (comment) Question level of assist    Co-evaluation               AM-PAC PT "6 Clicks" Daily Activity   Outcome Measure Difficulty turning over in bed (including adjusting bedclothes, sheets and blankets)?: Total Difficulty moving from lying on back to sitting on the side of the bed? : Total Difficulty sitting down on and standing up from a chair with arms (e.g., wheelchair, bedside commode, etc,.)?: Total Help needed moving to and from a bed to chair (including a wheelchair)?: Total Help needed walking in hospital room?: Total Help needed climbing 3-5 steps with a railing? : Total 6 Click Score: 6    End of Session   Activity Tolerance: Patient limited by fatigue Patient left: in bed;with call bell/phone within reach;with bed alarm set;with family/visitor present (Nursing tech notified pt requiring clean-up d/t urinary incontinence in bed) Nurse Communication: Mobility status;Precautions;Weight bearing status PT Visit Diagnosis: Other abnormalities of gait and mobility (R26.89);Muscle weakness (generalized) (M62.81);History of falling (Z91.81)    Time: 1610-9604 PT Time Calculation (min) (ACUTE ONLY): 32 min   Charges:   PT Evaluation $PT Eval Low  Complexity: 1 Procedure     PT G Codes:   PT G-Codes **NOT FOR INPATIENT CLASS** Functional Assessment Tool Used: AM-PAC 6 Clicks Basic Mobility Functional Limitation: Mobility: Walking and moving around Mobility: Walking and Moving Around Current Status (Z6109(G8978): 100 percent impaired, limited or restricted Mobility: Walking and Moving Around Goal Status (U0454(G8979): At least 60 percent but less than 80 percent impaired, limited or restricted    Hendricks Limesmily Hardy Harcum, PT 11/17/16, 1:59 PM [phone]  Addendum:  No c/o L hip pain during above session's activities. Hendricks LimesEmily Moni Rothrock, PT 11/17/16, 2:04 PM [phone]

## 2016-11-17 NOTE — H&P (Signed)
[facility] - [place] at [facility]   PATIENT NAME: [name]    MR#:  [id number]  DATE OF BIRTH:  10-17-1933  DATE OF ADMISSION:  11/16/2016  PRIMARY CARE PHYSICIAN: [name], MD   REQUESTING/REFERRING PHYSICIAN:   CHIEF COMPLAINT:   Chief Complaint  Patient presents with  . Weakness  . Fall    HISTORY OF PRESENT ILLNESS: [name]  is a 83 y.o. male with a known history of CVA with left-sided weakness, hypertension, gout, arthritis presented to the emergency room with generalized weakness and dizziness. Patient had 2 falls yesterday and was feeling dizzy. He is mostly wheelchair bound but fell down from the wheelchair while going downhill yesterday. He says he feels dizzy whenever he stands up. Also has some pain in the knee for the last couple of months. Patient was evaluated in the emergency room with CT head and CT angiogram of the neck. 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 Case was discussed with by ER physician with [facility] Dr. Leonie Douglas who recommended no acute intervention. Case was also discussed with vascular surgeon Dr. Gilda Crease who recommended no acute intervention for now. Hospitalist service was consulted for further care of the patient.  PAST MEDICAL HISTORY:   Past Medical History:  Diagnosis Date  . Arthritis   . Gout   . Hypertension   . Stroke [facility])    left sided weakness    PAST SURGICAL HISTORY: Past Surgical History:  Procedure Laterality Date  . INGUINAL HERNIA REPAIR Right 09/21/2015   Procedure: HERNIA REPAIR INGUINAL ADULT;  Surgeon: [name], MD;  Location: [facility] ORS;  Service: General;  Laterality: Right;  . LOWER EXTREMITY ANGIOGRAPHY Left 09/30/2016   Procedure: Lower Extremity Angiography;  Surgeon: [name], MD;  Location: [facility] INVASIVE CV LAB;  Service:  Cardiovascular;  Laterality: Left;    SOCIAL HISTORY:  Social History  Substance Use Topics  . Smoking status: Current Every Day Smoker    Packs/day: 1.00    Years: 50.00    Types: Cigarettes  . Smokeless tobacco: Never Used  . Alcohol use No     Comment: previous heavy drinker now 1x month    FAMILY HISTORY:  Family History  Problem Relation Age of Onset  . Kidney disease Neg Hx   . Prostate cancer Neg Hx     DRUG ALLERGIES:  Allergies  Allergen Reactions  . Hydrochlorothiazide     Documented on MAR  . Lisinopril     Documented on MAR    REVIEW OF SYSTEMS:   CONSTITUTIONAL: No fever, generalized weakness.  EYES: No blurred or double vision.  EARS, NOSE, AND THROAT: No tinnitus or ear pain.  RESPIRATORY: No cough, shortness of breath, wheezing or hemoptysis.  CARDIOVASCULAR: No chest pain, orthopnea, edema.  GASTROINTESTINAL: No nausea, vomiting, diarrhea or abdominal pain.  GENITOURINARY: No dysuria, hematuria.  ENDOCRINE: No polyuria, nocturia,  HEMATOLOGY: No anemia, easy bruising or bleeding SKIN: No rash or lesion. MUSCULOSKELETAL: No joint pain or arthritis.   NEUROLOGIC: No tingling, numbness, has weakness.  Has dizziness PSYCHIATRY: No anxiety or depression.   MEDICATIONS AT HOME:  Prior to Admission medications   Medication Sig Start Date End Date Taking? Authorizing Provider  acetaminophen (TYLENOL) 325 MG tablet Take 650 mg by mouth every 6 (six) hours as needed for moderate pain.   Yes  [provider]  albuterol (PROVENTIL HFA;VENTOLIN HFA) 108 (90 Base) MCG/ACT inhaler Inhale 2 puffs into the lungs every 6 (six) hours as needed for wheezing or shortness of breath. 02/18/16  Yes Paduchowski, Caryn Bee, MD  amLODipine (NORVASC) 5 MG tablet Take 5 mg by mouth daily.   Yes [provider]  aspirin EC 81 MG tablet Take 81 mg by mouth daily.   Yes [provider]  clopidogrel (PLAVIX) 75 MG tablet Take 1 tablet (75 mg total) by mouth  daily. 10/01/16  Yes Schnier, Latina Craver, MD  docusate sodium (COLACE) 100 MG capsule Take 100 mg by mouth 2 (two) times daily.   Yes [provider]  DULoxetine (CYMBALTA) 30 MG capsule Take 30 mg by mouth daily.   Yes [provider]  febuxostat (ULORIC) 40 MG tablet Take 40 mg by mouth daily.   Yes [provider]  hypromellose (GENTEAL SEVERE) 0.3 % GEL ophthalmic ointment Place 1 application into the left eye as needed (irritation/watering).    Yes [provider]  ipratropium (ATROVENT) 0.03 % nasal spray Place 1 spray into both nostrils every 4 (four) hours as needed (congestion).    Yes [provider]  loratadine (CLARITIN) 10 MG tablet Take 10 mg by mouth every evening.    Yes [provider]  lovastatin (MEVACOR) 20 MG tablet Take 20 mg by mouth every evening.   Yes [provider]  meloxicam (MOBIC) 7.5 MG tablet Take 7.5 mg by mouth every evening.    Yes [provider]  tamsulosin (FLOMAX) 0.4 MG CAPS capsule Take 1 capsule (0.4 mg total) by mouth daily. 03/14/16  Yes McGowan, Carollee Herter A, PA-C      PHYSICAL EXAMINATION:   VITAL SIGNS: Blood pressure (!) 153/90, pulse 97, temperature 98.1 F (36.7 C), temperature source Oral, resp. rate (!) 21, SpO2 92 %.  GENERAL:  83 y.o.-year-old patient lying in the bed with no acute distress.  EYES: Pupils equal, round, reactive to light and accommodation. No scleral icterus. Extraocular muscles intact.  HEENT: Head atraumatic, normocephalic. Oropharynx and nasopharynx clear.  NECK:  Supple, no jugular venous distention. No thyroid enlargement, no tenderness.  LUNGS: Normal breath sounds bilaterally, no wheezing, rales,rhonchi or crepitation. No use of accessory muscles of respiration.  CARDIOVASCULAR: S1, S2 normal. No murmurs, rubs, or gallops.  ABDOMEN: Soft, nontender, nondistended. Bowel sounds present. No organomegaly or mass.  EXTREMITIES: No pedal edema, cyanosis, or  clubbing.  NEUROLOGIC: Cranial nerves II through XII are intact. Muscle strength 3/5 in left upper and left lower extremities. Muscle strength 5/5 right upper and lower extremities. Sensation intact. Gait not checked.  PSYCHIATRIC: The patient is alert and oriented x 3.  SKIN: No obvious rash, lesion, or ulcer.   LABORATORY PANEL:   CBC  Recent Labs Lab 11/16/16 2102  WBC 7.1  HGB 12.0*  HCT 35.4*  PLT 291  MCV 90.3  MCH 30.6  MCHC 33.9  RDW 14.3  LYMPHSABS 1.5  MONOABS 1.2*  EOSABS 0.7  BASOSABS 0.0   ------------------------------------------------------------------------------------------------------------------  Chemistries   Recent Labs Lab 11/16/16 2102  NA 141  K 3.8  CL 108  CO2 25  GLUCOSE 93  BUN 21*  CREATININE 1.18  CALCIUM 9.4  AST 36  ALT 20  ALKPHOS 89  BILITOT 0.4   ------------------------------------------------------------------------------------------------------------------ CrCl cannot be calculated (Unknown ideal weight.). ------------------------------------------------------------------------------------------------------------------  Recent Labs  11/16/16 2102  TSH 0.454     Coagulation profile No results for input(s): INR,  PROTIME in the last 168 hours. ------------------------------------------------------------------------------------------------------------------- No results for input(s): DDIMER in the last 72 hours. -------------------------------------------------------------------------------------------------------------------  Cardiac Enzymes  Recent Labs Lab 11/16/16 2102  TROPONINI <0.03   ------------------------------------------------------------------------------------------------------------------ Invalid input(s): POCBNP  ---------------------------------------------------------------------------------------------------------------  Urinalysis    Component Value Date/Time   COLORURINE STRAW (A)  11/16/2016 2130   APPEARANCEUR CLEAR (A) 11/16/2016 2130   APPEARANCEUR Clear 01/03/2014 2234   LABSPEC 1.008 11/16/2016 2130   LABSPEC 1.008 01/03/2014 2234   PHURINE 5.0 11/16/2016 2130   GLUCOSEU NEGATIVE 11/16/2016 2130   GLUCOSEU Negative 01/03/2014 2234   HGBUR NEGATIVE 11/16/2016 2130   BILIRUBINUR NEGATIVE 11/16/2016 2130   BILIRUBINUR Negative 01/03/2014 2234   KETONESUR NEGATIVE 11/16/2016 2130   PROTEINUR NEGATIVE 11/16/2016 2130   UROBILINOGEN 1.0 11/24/2009 1915   NITRITE NEGATIVE 11/16/2016 2130   LEUKOCYTESUR NEGATIVE 11/16/2016 2130   LEUKOCYTESUR Negative 01/03/2014 2234     RADIOLOGY: Ct Angio Head W Or Wo Contrast  Result Date: 11/17/2016 CLINICAL DATA:  83 y/o  M; ataxia. EXAM: CT ANGIOGRAPHY HEAD AND NECK TECHNIQUE: Multidetector CT imaging of the head and neck was performed using the standard protocol during bolus administration of intravenous contrast. Multiplanar CT image reconstructions and MIPs were obtained to evaluate the vascular anatomy. Carotid stenosis measurements (when applicable) are obtained utilizing NASCET criteria, using the distal internal carotid diameter as the denominator. CONTRAST:  75 cc Isovue 370 COMPARISON:  11/16/16 Ct of the head. 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 Right carotid system: No evidence of dissection, stenosis (50% or greater) or occlusion. Severe mixed plaque of the bifurcation without significant stenosis. Left carotid system: No evidence of dissection, stenosis (50% or greater) or occlusion. Severe mixed plaque of the bifurcation with 30% proximal ICA stenosis. Vertebral arteries: Left dominant. Right vertebral artery origin severe stenosis. Skeleton: C5-6 grade 1 anterolisthesis. Severe cervical spondylosis greatest at the C3-4 level there is moderate bony canal stenosis. Other neck: Negative. Upper  chest: Severe emphysema of the lung apices. Moderate scarring and bronchiectasis. Review of the MIP images confirms the above findings CTA HEAD FINDINGS Anterior circulation: No proximal occlusion or vascular malformation. Right ICA broad-based aneurysm superior directed near the terminus measuring 4 x 7 x 3 mm (7:109). Extensive calcific atherosclerosis of the carotid siphons with moderate-to-severe right paraclinoid stenosis and moderate left. Posterior circulation: No significant stenosis, proximal occlusion, aneurysm, or vascular malformation. Venous sinuses: As permitted by contrast timing, patent. Anatomic variants: Bilateral fetal PCA. Small patent anterior communicating. Delayed phase: No abnormal intracranial enhancement. Review of the MIP images confirms the above findings IMPRESSION: 1. 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 Recommend annual imaging followup by CTA or MRA. This recommendation follows 2010 ACCF/AHA/AATS/ACR/ASA/SCA/SCAI/SIR/STS/SVM Guidelines for the Diagnosis and Management of Patients with Thoracic Aortic Disease. Circulation. 2010; 121: [id number]. 2. 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 3. 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 4. Right vertebral artery origin severe stenosis. 5. Severe cervical spondylosis greatest at the C3-4 level there is moderate bony canal stenosis. 6. No proximal occlusion or vascular malformation of the circle of Willis. 7. Right ICA broad-based aneurysm superior directed near the terminus measuring up to 7 mm. 8. Extensive calcific atherosclerosis of the carotid siphons with moderate-to-severe right paraclinoid stenosis and moderate left. Electronically Signed   By: Buzzy HanLance  Furusawa-Stratton M.D.  On: 11/17/2016 00:03   Dg Chest 1 View  Result Date: 11/16/2016 CLINICAL DATA:  Dizziness and fall today. EXAM: CHEST 1 VIEW COMPARISON:  02/18/2016 and prior radiographs  FINDINGS: Cardiomegaly again noted. There is no evidence of focal airspace disease, pulmonary edema, suspicious pulmonary nodule/mass, pleural effusion, or pneumothorax. No acute bony abnormalities are identified. IMPRESSION: No evidence of acute cardiopulmonary disease. Electronically Signed   By: Harmon Pier M.D.   On: 11/16/2016 21:42   Dg Pelvis 1-2 Views  Result Date: 11/16/2016 CLINICAL DATA:  Fall with pelvic pain. EXAM: PELVIS - 1-2 VIEW COMPARISON:  None. FINDINGS: An intratrochanteric left femur fracture appears remote but correlate clinically. No other fracture, subluxation or dislocation identified. Degenerative changes in the lower lumbar spine and hips are noted. IMPRESSION: Intertrochanteric left femur fracture which appears remote but correlate clinically. Consider dedicated left hip films as indicated. Electronically Signed   By: Harmon Pier M.D.   On: 11/16/2016 21:46   Ct Head Wo Contrast  Result Date: 11/16/2016 CLINICAL DATA:  Dizziness. Weakness since this morning. Fall out of wheelchair. EXAM: CT HEAD WITHOUT CONTRAST TECHNIQUE: Contiguous axial images were obtained from the base of the skull through the vertex without intravenous contrast. COMPARISON:  Head CT and brain MRI 01/03/2014 FINDINGS: Brain: Similar degree of atrophy and chronic small vessel ischemia. Remote lacunar infarct in the right basal ganglia. No evidence of large vessel ischemia, mass effect, or midline shift. No hydrocephalus. No extra-axial or subdural fluid collection. Vascular: Atherosclerosis of skullbase vasculature without hyperdense vessel or abnormal calcification. Skull:  No fracture or focal lesion. Sinuses/Orbits: Chronic opacification of right mastoid air cells. Left mastoid air cells are well-aerated. Mucosal thickening in the left maxillary sinus with minimal debris. Remote left nasal bone fracture. Visualized orbits are unremarkable. Other: None. IMPRESSION: Similar atrophy, chronic small vessel  ischemia and remote lacunar infarct in the right basal ganglia. No acute intracranial abnormality. Chronic opacification of right mastoid air cells. Minimal left maxillary sinus disease, possibly acute with intraluminal debris. Electronically Signed   By: Rubye Oaks M.D.   On: 11/16/2016 22:11   Ct Angio Neck W And/or Wo Contrast  Result Date: 11/17/2016 CLINICAL DATA:  83 y/o  M; ataxia. EXAM: CT ANGIOGRAPHY HEAD AND NECK TECHNIQUE: Multidetector CT imaging of the head and neck was performed using the standard protocol during bolus administration of intravenous contrast. Multiplanar CT image reconstructions and MIPs were obtained to evaluate the vascular anatomy. Carotid stenosis measurements (when applicable) are obtained utilizing NASCET criteria, using the distal internal carotid diameter as the denominator. CONTRAST:  75 cc Isovue 370 COMPARISON:  11/16/16 Ct of the head. 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 Right carotid system: No evidence of dissection, stenosis (50% or greater) or occlusion. Severe mixed plaque of the bifurcation without significant stenosis. Left carotid system: No evidence of dissection, stenosis (50% or greater) or occlusion. Severe mixed plaque of the bifurcation with 30% proximal ICA stenosis. Vertebral arteries: Left dominant. Right vertebral artery origin severe stenosis. Skeleton: C5-6 grade 1 anterolisthesis. Severe cervical spondylosis greatest at the C3-4 level there is moderate bony canal stenosis. Other neck: Negative. Upper chest: Severe emphysema of the lung apices. Moderate scarring and bronchiectasis. Review of the MIP images confirms the above findings CTA HEAD FINDINGS Anterior circulation: No proximal occlusion or vascular malformation. Right ICA broad-based aneurysm superior directed near the terminus measuring  4 x 7 x 3 mm (7:109).  Extensive calcific atherosclerosis of the carotid siphons with moderate-to-severe right paraclinoid stenosis and moderate left. Posterior circulation: No significant stenosis, proximal occlusion, aneurysm, or vascular malformation. Venous sinuses: As permitted by contrast timing, patent. Anatomic variants: Bilateral fetal PCA. Small patent anterior communicating. Delayed phase: No abnormal intracranial enhancement. Review of the MIP images confirms the above findings IMPRESSION: 1. 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 Recommend annual imaging followup by CTA or MRA. This recommendation follows 2010 ACCF/AHA/AATS/ACR/ASA/SCA/SCAI/SIR/STS/SVM Guidelines for the Diagnosis and Management of Patients with Thoracic Aortic Disease. Circulation. 2010; 121: [id number]. 2. 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 3. 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 4. Right vertebral artery origin severe stenosis. 5. Severe cervical spondylosis greatest at the C3-4 level there is moderate bony canal stenosis. 6. No proximal occlusion or vascular malformation of the circle of Willis. 7. Right ICA broad-based aneurysm superior directed near the terminus measuring up to 7 mm. 8. Extensive calcific atherosclerosis of the carotid siphons with moderate-to-severe right paraclinoid stenosis and moderate left. Electronically Signed   By: Mitzi Hansen M.D.   On: 11/17/2016 00:03   Dg Femur Min 2 Views Left  Result Date: 11/16/2016 CLINICAL DATA:  Pain after fall today EXAM: LEFT FEMUR 2 VIEWS COMPARISON:  None. FINDINGS: The knee is flexed on all the images limiting assessment. There is femorotibial joint space narrowing. Likewise there is joint space narrowing of the left hip. No acute displaced fracture nor dislocation is noted. Vascular calcifications are present. 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 No acute fracture nor  dislocations. Electronically Signed   By: Tollie Eth M.D.   On: 11/16/2016 22:59    EKG: Orders placed or performed during the hospital encounter of 02/18/16  . EKG 12-Lead  . EKG 12-Lead    IMPRESSION AND PLAN: 83 year old male patient with history of CVA, gout, hypertension, arthritis presented to the emergency room with weakness, fall and dizziness. 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 Dizziness 4. Gait instability 5. Hypertension 6. History of CVA Treatment plan Admit patient to medical floor observation bed Neuro check every 4 hourly MRI brain in the morning Neurology consultation Neurosurgery follow-up Continue aspirin and Plavix DVT prophylaxis subcutaneous Lovenox 40 MG daily Supportive care  All the records are reviewed and case discussed with ED provider. Management plans discussed with the patient, family and they are in agreement.  CODE STATUS:FULL CODE Code Status History    Date Active Date Inactive Code Status Order ID Comments User Context   09/30/2016 11:37 AM 09/30/2016  4:57 PM Full Code [id number]  Schnier, Latina Craver, MD Inpatient       TOTAL TIME TAKING CARE OF THIS PATIENT: 51 minutes.    Ihor Austin M.D on 11/17/2016 at 3:19 AM  Between 7am to 6pm - Pager - [phone]  After 6pm go to [email or web address] - password [email or web address] [facility]  [place] [facility]  Office  [phone]  CC: Primary care physician; [name], MD

## 2016-11-17 NOTE — Evaluation (Signed)
Clinical/Bedside Swallow Evaluation Patient Details  Name: [name] Bobeck Sr. MRN: [id number] Date of Birth: Jan 01, 1934  Today's Date: 11/17/2016 Time: SLP Start Time (ACUTE ONLY): 1035 SLP Stop Time (ACUTE ONLY): 1135 SLP Time Calculation (min) (ACUTE ONLY): 60 min  Past Medical History:  Past Medical History:  Diagnosis Date  . Arthritis   . Gout   . Hypertension   . Stroke [facility])    left sided weakness   Past Surgical History:  Past Surgical History:  Procedure Laterality Date  . INGUINAL HERNIA REPAIR Right 09/21/2015   Procedure: HERNIA REPAIR INGUINAL ADULT;  Surgeon: [name], MD;  Location: [facility] ORS;  Service: General;  Laterality: Right;  . LOWER EXTREMITY ANGIOGRAPHY Left 09/30/2016   Procedure: Lower Extremity Angiography;  Surgeon: [name], MD;  Location: [facility] INVASIVE CV LAB;  Service: Cardiovascular;  Laterality: Left;   HPI:  Pt is a 83 y.o. male with a known history of CVA with left-sided weakness, hypertension, gout, arthritis presented to the emergency room with generalized weakness and dizziness. Patient had 2 falls yesterday and was feeling dizzy. He is mostly wheelchair bound but fell down out of the wheelchair while going downhill yesterday. He says he feels dizzy whenever he stands up. Also has some pain in the knee for the last couple of months. Patient was evaluated in the emergency room with CT head and CT angiogram of the neck. 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 Case was discussed with by ER physician with [facility] Dr. Leonie Douglas who recommended no acute intervention. Case was also discussed with vascular surgeon Dr. Gilda Crease who recommended no acute intervention for now. Unsure of pt's baseline Cognitive status d/t limited verbal output/engagement. Pt is wheelchair bound per report living at a group home; requires  assistance w/ ADLs. Pt has a significant lean forward/over requiring support to sit fully upright.    Assessment / Plan / Recommendation Clinical Impression  Pt appears at min increased risk for aspiration w/ po's d/t poor body positioning (leaning over/head down/forward and the reduced ability/strength to hold himself more upright for any length of time) and edentulous status; pt also requires assistance w/ eating and drinking at meals d/t overall weakness. When following general aspiration precautions w/ po tirals given, no apparent oropharyngeal phase dysphagia was noted at this evaluation today. Pt consumed po trials of thin liquids via cup/straw and purees (no softened chopped foods available) w/ no overt s/s of aspiration noted; clear vocal quality noted b/t trials, no decline in respiratory status w/ trials. Noted pt taking long draws on the straw x2, multiple swallowing of multiple trials x1( a much delayed, mild throat clear occurred x1 after multiple swallows/drinking) - unsure if related to the trial, but trials were then limited to Single swallow trials w/ no other overt s/s of aspiration/dysphagia noted. Oral phase timing for bolus management and clearing was appopriate w/ consistencies given. Suspect pt may require increased time/effort for mastication and clearing of increased textured bolus consistencies(mech soft) d/t pt lacking dentition sufficient for full mastication of solids (he relies on gumming/mahsing foods). This could contribute to increase risk for choking on solids not well masticated b/f swallowing as well as increasing risk for Esophageal dysmotility of poorly masticated solids swallowed. Recommend modifying diet to a Dysphagia level 2 w/ thin liquids initially while acutely ill to conserve effort/energy of masticating tougher foods; monitoring at  bedside/meals post discharge if any diet consistency upgrade to solids is attempted to assess for safe mastication and swallowing;  aspiration precautions; Pills CRUSHED in puree for easier swallowing as able d/t recent pocketing of pills w/ NSG (whole in puree if unable to crush). ST services will f/u w/ education on diet options, food preparation, precautions.   SLP Visit Diagnosis: Dysphagia, oropharyngeal phase (R13.12) (edentulous)    Aspiration Risk  Mild aspiration risk (but reduced following general aspiration precautions)    Diet Recommendation  Dysphagia level 2 w/ gravy/condiments added to moisten; Thin liquids. Aspiration precautions; assistance at meals.   Medication Administration: Crushed with puree (whole in puree if unable to crush)    Other  Recommendations Recommended Consults:  (Dietician consult for f/u w/ nutritional support) Oral Care Recommendations: Oral care BID;Staff/trained caregiver to provide oral care   Follow up Recommendations None (TBD)      Frequency and Duration min 2x/week  1 week       Prognosis Prognosis for Safe Diet Advancement: Fair      Swallow Study   General Date of Onset: 11/16/16 HPI: Pt is a 83 y.o. male with a known history of CVA with left-sided weakness, hypertension, gout, arthritis presented to the emergency room with generalized weakness and dizziness. Patient had 2 falls yesterday and was feeling dizzy. He is mostly wheelchair bound but fell down out of the wheelchair while going downhill yesterday. He says he feels dizzy whenever he stands up. Also has some pain in the knee for the last couple of months. Patient was evaluated in the emergency room with CT head and CT angiogram of the neck. 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 Case was discussed with by ER physician with [facility] Dr. Leonie Douglas who recommended no acute intervention. Case was also discussed with vascular surgeon Dr. Gilda Crease who recommended no acute intervention for  now. Unsure of pt's baseline Cognitive status d/t limited verbal output/engagement. Pt is wheelchair bound per report living at a group home; requires assistance w/ ADLs. Pt has a significant lean forward/over requiring support to sit fully upright.  Type of Study: Bedside Swallow Evaluation Previous Swallow Assessment: unknown by pt/son Diet Prior to this Study: Regular;Thin liquids Temperature Spikes Noted: No (wbc 7.1) Respiratory Status: Room air History of Recent Intubation: No Behavior/Cognition: Alert;Cooperative;Pleasant mood;Distractible;Requires cueing (min verbal) Oral Cavity Assessment: Within Functional Limits (limited assessment ) Oral Care Completed by SLP: Recent completion by staff Oral Cavity - Dentition: Edentulous Vision: Functional for self-feeding Self-Feeding Abilities: Needs assist;Needs set up;Total assist Patient Positioning: Upright in bed (EOB support by PT) Baseline Vocal Quality: Low vocal intensity (mumbled speech w/ decreased articulation - no teeth) Volitional Cough:  (Fair) Volitional Swallow: Able to elicit    Oral/Motor/Sensory Function Overall Oral Motor/Sensory Function: Within functional limits (grossly wfl w/ bolus management; OMEs)   Ice Chips Ice chips: Within functional limits Presentation: Spoon (fed; 1 trial)   Thin Liquid Thin Liquid: Within functional limits (grossly) Presentation: Self Fed;Straw;Cup (assisted; 3 trials cup, 4 trials straw) Other Comments: long draws on the straw x2, multiple swallowing of multiple trials x1( a much delayed, mild throat clear occurred x1 after multiple swallows/drinking) - unsure if related to the trial but trials were then limited to Single swallow trials w/ no other overt s/s of aspiration/dysphagia noted.     Nectar Thick Nectar Thick Liquid: Not tested  Honey Thick Honey Thick Liquid: Not tested   Puree Puree: Within functional limits Presentation: Spoon (8 trials)   Solid   GO   Solid: Not  tested Other Comments: pt was receiving PT; needed food items (level 2) and pt is edentulous baseline    Functional Assessment Tool Used: clinical judgement Functional Limitations: Swallowing Swallow Current Status ([id number]([id number]): At least 1 percent but less than 20 percent impaired, limited or restricted Swallow Goal Status [phone]([id number]): At least 1 percent but less than 20 percent impaired, limited or restricted Swallow Discharge Status [phone](G8998): At least 1 percent but less than 20 percent impaired, limited or restricted    Jerilynn SomKatherine Younique Casad, MS, CCC-SLP Akiel Fennell 11/17/2016,2:22 PM

## 2016-11-18 DIAGNOSIS — R42 Dizziness and giddiness: Secondary | ICD-10-CM | POA: Diagnosis not present

## 2016-11-18 MED ORDER — ENSURE ENLIVE PO LIQD
237.0000 mL | Freq: Two times a day (BID) | ORAL | Status: DC
  Administered 2016-11-18 (×2): 237 mL via ORAL

## 2016-11-18 MED ORDER — ENSURE ENLIVE PO LIQD: 237.0000 mL | Freq: Three times a day (TID) | ORAL | 12 refills | Status: AC

## 2016-11-18 MED ORDER — AMLODIPINE BESYLATE 10 MG PO TABS
10.0000 mg | ORAL_TABLET | Freq: Every day | ORAL | Status: DC
  Administered 2016-11-18: 10:00:00 10 mg via ORAL
  Filled 2016-11-18: qty 1

## 2016-11-18 MED ORDER — OXYCODONE-ACETAMINOPHEN 5-325 MG PO TABS
1.0000 | ORAL_TABLET | ORAL | Status: DC | PRN
  Administered 2016-11-18: 06:00:00 1 via ORAL
  Filled 2016-11-18: qty 1

## 2016-11-18 NOTE — Clinical Social Work Note (Signed)
Pt is ready for discharge today and will [facility]. CSW consulted as pt was admitted from group home and PT was recommending SNF, however per group home they are able to provide 2+ assist for pt and he gets around with a wheelchair. Pt's guardian, Cleophas Dunkerngela Riley, is aware and agreeable to discharge plan. Facility is ready to accept pt. [facility]. RN is aware. CSW is signing off as no further needs identified.   Dede QuerySarah Abiageal Blowe, MSW, LCSW  Clinical Social Worker  [phone]

## 2016-11-18 NOTE — Progress Notes (Signed)
  Speech Language Pathology Treatment:    Patient Details Name: [name] MeigsLinnies Rehman Sr. MRN: [id number] DOB: February 05, 1934 Today's Date: 11/18/2016 Time:  -     Chart reviewed. Visited Pt. Family reports Pt is tolerating current. Intake poor. Plans for [facility] to [name] per MD notes. Rec continue with soft solids at discharge.                              GO                Eather ColasJennifer A Tyrese Ficek 11/18/2016, 12:44 PM

## 2016-11-18 NOTE — NC FL2 (Signed)
[place] MEDICAID FL2 LEVEL OF CARE SCREENING TOOL     IDENTIFICATION  Patient Name: [name] MeigsLinnies Pribble Sr. Birthdate: [birth year] Sex: male Admission Date (Current Location): 11/16/2016  [place]   Facility and Address:  [facility], [address], [place], [place] [address]      Provider Number: [id number]  Attending Physician Name and Address:  Enedina FinnerPatel, Sona, MD  Relative Name and Phone Number:       Current Level of Care: Hospital [facility] Prior Approval Number:    Date Approved/Denied:   PASRR Number:    Discharge Plan: Domiciliary (Rest home)    Current Diagnoses: Patient Active Problem List   Diagnosis Date Noted  . Gait instability 11/17/2016  . Lower extremity pain, left 09/17/2016  . PAD (peripheral artery disease) (HCC) 09/17/2016  . Tobacco dependence 09/17/2016    Orientation RESPIRATION BLADDER Height & Weight     Self, Place  Normal Incontinent Weight: 130 lb 12.8 oz (59.3 kg) Height:  5\' 2"  (157.5 cm)  BEHAVIORAL SYMPTOMS/MOOD NEUROLOGICAL BOWEL NUTRITION STATUS      Incontinent Diet (DYS 2, Thin Liquids, Add Extra Gravy on meats, potatoes.  Cream soups, puddings, Yogurts, and hot cereals added to meals.)  AMBULATORY STATUS COMMUNICATION OF NEEDS Skin   Extensive Assist Verbally Normal                       Personal Care Assistance Level of Assistance  Bathing, Feeding, Dressing Bathing Assistance: Maximum assistance Feeding assistance: Limited assistance Dressing Assistance: Maximum assistance     Functional Limitations Info  Sight, Hearing, Speech Sight Info: Adequate Hearing Info: Adequate Speech Info: Adequate    SPECIAL CARE FACTORS FREQUENCY                       Contractures Contractures Info: Not present    Additional Factors Info  Code Status, Allergies Code Status Info: Full Code Allergies Info: Hydrochlorithiazude,  Lisinopril           Discharge Medications: Please see discharge summary for a list of discharge medications. Current Discharge Medication List       START taking these medications   Details  feeding supplement, ENSURE ENLIVE, (ENSURE ENLIVE) LIQD Take 237 mLs by mouth 3 (three) times daily between meals. Qty: 237 mL, Refills: 12         CONTINUE these medications which have NOT CHANGED   Details  acetaminophen (TYLENOL) 325 MG tablet Take 650 mg by mouth every 6 (six) hours as needed for moderate pain.    albuterol (PROVENTIL HFA;VENTOLIN HFA) 108 (90 Base) MCG/ACT inhaler Inhale 2 puffs into the lungs every 6 (six) hours as needed for wheezing or shortness of breath. Qty: 1 Inhaler, Refills: 2    amLODipine (NORVASC) 5 MG tablet Take 5 mg by mouth daily.    aspirin EC 81 MG tablet Take 81 mg by mouth daily.    clopidogrel (PLAVIX) 75 MG tablet Take 1 tablet (75 mg total) by mouth daily. Qty: 30 tablet, Refills: 11    docusate sodium (COLACE) 100 MG capsule Take 100 mg by mouth 2 (two) times daily.    DULoxetine (CYMBALTA) 30 MG capsule Take 30 mg by mouth daily.    febuxostat (ULORIC) 40 MG tablet Take 40 mg by mouth daily.    hypromellose (GENTEAL SEVERE) 0.3 % GEL ophthalmic ointment Place 1 application into the left  eye as needed (irritation/watering).     ipratropium (ATROVENT) 0.03 % nasal spray Place 1 spray into both nostrils every 4 (four) hours as needed (congestion).     loratadine (CLARITIN) 10 MG tablet Take 10 mg by mouth every evening.     lovastatin (MEVACOR) 20 MG tablet Take 20 mg by mouth every evening.    meloxicam (MOBIC) 7.5 MG tablet Take 7.5 mg by mouth every evening.     tamsulosin (FLOMAX) 0.4 MG CAPS capsule Take 1 capsule (0.4 mg total) by mouth daily. Qty: 90 capsule, Refills: 3       Relevant Imaging Results:  Relevant Lab Results:   Additional Information SSN:  [id number]  Dede Query, LCSW

## 2016-11-18 NOTE — Discharge Summary (Signed)
[facility]   PATIENT NAME: [name]    MR#:  [id number]  DATE OF BIRTH:  April 16, 1934  DATE OF ADMISSION:  11/16/2016 ADMITTING PHYSICIAN: Ihor Austin, MD  DATE OF DISCHARGE: 11/18/16  PRIMARY CARE PHYSICIAN: [name], MD    ADMISSION DIAGNOSIS:  Dizziness and giddiness [R42] Dizziness [R42] Ataxia [R27.0] Weakness [R53.1] Fall, initial encounter [W19.XXXA]  DISCHARGE DIAGNOSIS:  Dizziness/Gait instability-etiology unclear. Patient is wheelchair-bound bedbound Generalized weakness Atherosclerotic vascular disease  SECONDARY DIAGNOSIS:   Past Medical History:  Diagnosis Date  . Arthritis   . Gout   . Hypertension   . Stroke [facility])    left sided weakness    HOSPITAL COURSE:  [name] with left-sided weakness, hypertension, gout, arthritis presented to the emergency room with generalized weakness and dizziness.Patient had 2 falls yesterday and was feeling dizzy.He is mostly wheelchair bound but fell down from the wheelchair while going downhill yesterday.He says he feels dizzy whenever he stands up.Also has some pain in the knee for the last couple of months.  1. Dizziness/gait instability Patient came in after a fall from his wheelchair yesterday. He is mainly wheelchair-bound. -Neurology consultation appreciated by Dr. Thad Ranger. MRI/MRA brain no acute changes. -Continue aspirin and Plavix  -Speech therapy recommendation appreciated Physical therapy recommends rehab. Pt is WC and bed bound not sure if he will be able to participate acitively. 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 Gilda Crease recommend no urgent intervention needed.  -appreciate Dr Driscilla Grammes input  3. Hypertension continue home meds   4.BPH on  flomax  5.DVT prophylaxis heparin  6. Left femoral intertrochanteric fracture appears remote according to x-ray -We'll continue physical therapy with weightbearing as tolerated   7.Chronic left LE ulcer s/p angioplasty in march 2018 Cont wound care  Spoke with son D/c today to [facility]. [place] a guardian  CONSULTS OBTAINED:  Treatment Team:  Thana Farr, MD Lucy Chris, MD Annice Needy, MD  DRUG ALLERGIES:   Allergies  Allergen Reactions  . Hydrochlorothiazide     Documented on MAR  . Lisinopril     Documented on MAR    DISCHARGE MEDICATIONS:   Current Discharge Medication List    START taking these medications   Details  feeding supplement, ENSURE ENLIVE, (ENSURE ENLIVE) LIQD Take 237 mLs by mouth 3 (three) times daily between meals. Qty: 237 mL, Refills: 12      CONTINUE these medications which have NOT CHANGED   Details  acetaminophen (TYLENOL) 325 MG tablet Take 650 mg by mouth every 6 (six) hours as needed for moderate pain.    albuterol (PROVENTIL HFA;VENTOLIN HFA) 108 (90 Base) MCG/ACT inhaler Inhale 2 puffs into the lungs every 6 (six) hours as needed for wheezing or shortness of breath. Qty: 1 Inhaler, Refills: 2    amLODipine (NORVASC) 5 MG tablet Take 5 mg by mouth daily.    aspirin EC 81 MG tablet Take 81 mg by mouth daily.    clopidogrel (PLAVIX) 75 MG tablet Take 1 tablet (75 mg total) by mouth daily. Qty: 30 tablet, Refills: 11    docusate sodium (COLACE) 100 MG capsule Take 100 mg by mouth 2 (two) times daily.    DULoxetine (CYMBALTA) 30 MG capsule Take  30 mg by mouth daily.    febuxostat (ULORIC) 40 MG tablet Take 40 mg by mouth daily.    hypromellose (GENTEAL SEVERE) 0.3 % GEL ophthalmic ointment Place 1 application into the left eye as needed (irritation/watering).     ipratropium (ATROVENT) 0.03 % nasal spray Place 1 spray into both nostrils every 4 (four) hours as needed (congestion).      loratadine (CLARITIN) 10 MG tablet Take 10 mg by mouth every evening.     lovastatin (MEVACOR) 20 MG tablet Take 20 mg by mouth every evening.    meloxicam (MOBIC) 7.5 MG tablet Take 7.5 mg by mouth every evening.     tamsulosin (FLOMAX) 0.4 MG CAPS capsule Take 1 capsule (0.4 mg total) by mouth daily. Qty: 90 capsule, Refills: 3        If you experience worsening of your admission symptoms, develop shortness of breath, life threatening emergency, suicidal or homicidal thoughts you must seek medical attention immediately by calling 911 or calling your MD immediately  if symptoms less severe.  You Must read complete instructions/literature along with all the possible adverse reactions/side effects for all the Medicines you take and that have been prescribed to you. Take any new Medicines after you have completely understood and accept all the possible adverse reactions/side effects.   Please note  You were cared for by a hospitalist during your hospital stay. If you have any questions about your discharge medications or the care you received while you were in the hospital after you are discharged, you can call the unit and asked to speak with the hospitalist on call if the hospitalist that took care of you is not available. Once you are discharged, your primary care physician will handle any further medical issues. Please note that NO REFILLS for any discharge medications will be authorized once you are discharged, as it is imperative that you return to your primary care physician (or establish a relationship with a primary care physician if you do not have one) for your aftercare needs so that they can reassess your need for medications and monitor your lab values.  DATA REVIEW:   CBC   Recent Labs Lab 11/16/16 2102  WBC 7.1  HGB 12.0*  HCT 35.4*  PLT 291    Chemistries   Recent Labs Lab 11/16/16 2102  NA 141  K 3.8  CL 108  CO2 25  GLUCOSE 93  BUN 21*  CREATININE 1.18   CALCIUM 9.4  AST 36  ALT 20  ALKPHOS 89  BILITOT 0.4    Microbiology Results   Recent Results (from the past 240 hour(s))  MRSA PCR Screening     Status: None   Collection Time: 11/17/16  5:00 AM  Result Value Ref Range Status   MRSA by PCR NEGATIVE NEGATIVE Final    Comment:        The GeneXpert MRSA Assay (FDA approved for NASAL specimens only), is one component of a comprehensive MRSA colonization surveillance program. It is not intended to diagnose MRSA infection nor to guide or monitor treatment for MRSA infections.     RADIOLOGY:  Ct Angio Head W Or Wo Contrast  Result Date: 11/17/2016 CLINICAL DATA:  83 y/o  M; ataxia. EXAM: CT ANGIOGRAPHY HEAD AND NECK TECHNIQUE: Multidetector CT imaging of the head and neck was performed using the standard protocol during bolus administration of intravenous contrast. Multiplanar CT image reconstructions and MIPs were obtained to evaluate the vascular anatomy. Carotid stenosis measurements (when  applicable) are obtained utilizing NASCET criteria, using the distal internal carotid diameter as the denominator. CONTRAST:  75 cc Isovue 370 COMPARISON:  11/16/16 Ct of the head. 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 Right carotid system: No evidence of dissection, stenosis (50% or greater) or occlusion. Severe mixed plaque of the bifurcation without significant stenosis. Left carotid system: No evidence of dissection, stenosis (50% or greater) or occlusion. Severe mixed plaque of the bifurcation with 30% proximal ICA stenosis. Vertebral arteries: Left dominant. Right vertebral artery origin severe stenosis. Skeleton: C5-6 grade 1 anterolisthesis. Severe cervical spondylosis greatest at the C3-4 level there is moderate bony canal stenosis. Other neck: Negative. Upper chest: Severe emphysema of the lung apices. Moderate scarring  and bronchiectasis. Review of the MIP images confirms the above findings CTA HEAD FINDINGS Anterior circulation: No proximal occlusion or vascular malformation. Right ICA broad-based aneurysm superior directed near the terminus measuring 4 x 7 x 3 mm (7:109). Extensive calcific atherosclerosis of the carotid siphons with moderate-to-severe right paraclinoid stenosis and moderate left. Posterior circulation: No significant stenosis, proximal occlusion, aneurysm, or vascular malformation. Venous sinuses: As permitted by contrast timing, patent. Anatomic variants: Bilateral fetal PCA. Small patent anterior communicating. Delayed phase: No abnormal intracranial enhancement. Review of the MIP images confirms the above findings IMPRESSION: 1. 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 Recommend annual imaging followup by CTA or MRA. This recommendation follows 2010 ACCF/AHA/AATS/ACR/ASA/SCA/SCAI/SIR/STS/SVM Guidelines for the Diagnosis and Management of Patients with Thoracic Aortic Disease. Circulation. 2010; 121: [id number]. 2. 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 3. 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 4. Right vertebral artery origin severe stenosis. 5. Severe cervical spondylosis greatest at the C3-4 level there is moderate bony canal stenosis. 6. No proximal occlusion or vascular malformation of the circle of Willis. 7. Right ICA broad-based aneurysm superior directed near the terminus measuring up to 7 mm. 8. Extensive calcific atherosclerosis of the carotid siphons with moderate-to-severe right paraclinoid stenosis and moderate left. Electronically Signed   By: Mitzi HansenLance  Furusawa-Stratton M.D.   On: 11/17/2016 00:03   Dg Chest 1 View  Result Date: 11/16/2016 CLINICAL DATA:  Dizziness and fall today. EXAM: CHEST 1 VIEW COMPARISON:  02/18/2016 and prior radiographs FINDINGS: Cardiomegaly again noted. There is no evidence of  focal airspace disease, pulmonary edema, suspicious pulmonary nodule/mass, pleural effusion, or pneumothorax. No acute bony abnormalities are identified. IMPRESSION: No evidence of acute cardiopulmonary disease. Electronically Signed   By: Harmon PierJeffrey  Hu M.D.   On: 11/16/2016 21:42   Dg Pelvis 1-2 Views  Result Date: 11/16/2016 CLINICAL DATA:  Fall with pelvic pain. EXAM: PELVIS - 1-2 VIEW COMPARISON:  None. FINDINGS: An intratrochanteric left femur fracture appears remote but correlate clinically. No other fracture, subluxation or dislocation identified. Degenerative changes in the lower lumbar spine and hips are noted. IMPRESSION: Intertrochanteric left femur fracture which appears remote but correlate clinically. Consider dedicated left hip films as indicated. Electronically Signed   By: Harmon PierJeffrey  Hu M.D.   On: 11/16/2016 21:46   Ct Head Wo Contrast  Result Date: 11/16/2016 CLINICAL DATA:  Dizziness. Weakness since this morning. Fall out of wheelchair. EXAM: CT HEAD WITHOUT CONTRAST TECHNIQUE: Contiguous axial images were obtained from the base of the skull through the vertex without intravenous contrast. COMPARISON:  Head CT and brain MRI 01/03/2014 FINDINGS:  Brain: Similar degree of atrophy and chronic small vessel ischemia. Remote lacunar infarct in the right basal ganglia. No evidence of large vessel ischemia, mass effect, or midline shift. No hydrocephalus. No extra-axial or subdural fluid collection. Vascular: Atherosclerosis of skullbase vasculature without hyperdense vessel or abnormal calcification. Skull:  No fracture or focal lesion. Sinuses/Orbits: Chronic opacification of right mastoid air cells. Left mastoid air cells are well-aerated. Mucosal thickening in the left maxillary sinus with minimal debris. Remote left nasal bone fracture. Visualized orbits are unremarkable. Other: None. IMPRESSION: Similar atrophy, chronic small vessel ischemia and remote lacunar infarct in the right basal ganglia.  No acute intracranial abnormality. Chronic opacification of right mastoid air cells. Minimal left maxillary sinus disease, possibly acute with intraluminal debris. Electronically Signed   By: Rubye Oaks M.D.   On: 11/16/2016 22:11   Ct Angio Neck W And/or Wo Contrast  Result Date: 11/17/2016 CLINICAL DATA:  83 y/o  M; ataxia. EXAM: CT ANGIOGRAPHY HEAD AND NECK TECHNIQUE: Multidetector CT imaging of the head and neck was performed using the standard protocol during bolus administration of intravenous contrast. Multiplanar CT image reconstructions and MIPs were obtained to evaluate the vascular anatomy. Carotid stenosis measurements (when applicable) are obtained utilizing NASCET criteria, using the distal internal carotid diameter as the denominator. CONTRAST:  75 cc Isovue 370 COMPARISON:  11/16/16 Ct of the head. 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 Right carotid system: No evidence of dissection, stenosis (50% or greater) or occlusion. Severe mixed plaque of the bifurcation without significant stenosis. Left carotid system: No evidence of dissection, stenosis (50% or greater) or occlusion. Severe mixed plaque of the bifurcation with 30% proximal ICA stenosis. Vertebral arteries: Left dominant. Right vertebral artery origin severe stenosis. Skeleton: C5-6 grade 1 anterolisthesis. Severe cervical spondylosis greatest at the C3-4 level there is moderate bony canal stenosis. Other neck: Negative. Upper chest: Severe emphysema of the lung apices. Moderate scarring and bronchiectasis. Review of the MIP images confirms the above findings CTA HEAD FINDINGS Anterior circulation: No proximal occlusion or vascular malformation. Right ICA broad-based aneurysm superior directed near the terminus measuring 4 x 7 x 3 mm (7:109). Extensive calcific atherosclerosis of the carotid siphons with  moderate-to-severe right paraclinoid stenosis and moderate left. Posterior circulation: No significant stenosis, proximal occlusion, aneurysm, or vascular malformation. Venous sinuses: As permitted by contrast timing, patent. Anatomic variants: Bilateral fetal PCA. Small patent anterior communicating. Delayed phase: No abnormal intracranial enhancement. Review of the MIP images confirms the above findings IMPRESSION: 1. 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 Recommend annual imaging followup by CTA or MRA. This recommendation follows 2010 ACCF/AHA/AATS/ACR/ASA/SCA/SCAI/SIR/STS/SVM Guidelines for the Diagnosis and Management of Patients with Thoracic Aortic Disease. Circulation. 2010; 121: Z610-R604. 2. 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 3. 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 4. Right vertebral artery origin severe stenosis. 5. Severe cervical spondylosis greatest at the C3-4 level there is moderate bony canal stenosis. 6. No proximal occlusion or vascular malformation of the circle of Willis. 7. Right ICA broad-based aneurysm superior directed near the terminus measuring up to 7 mm. 8. Extensive calcific atherosclerosis of the carotid siphons with moderate-to-severe right paraclinoid stenosis and moderate left. Electronically Signed   By: Mitzi Hansen M.D.   On: 11/17/2016 00:03   Mr Maxine Glenn Head Wo Contrast  Result  Date: 11/17/2016 CLINICAL DATA:  Dizziness and giddiness. EXAM: MRI HEAD WITHOUT CONTRAST MRA HEAD WITHOUT CONTRAST TECHNIQUE: Multiplanar, multiecho pulse sequences of the brain and surrounding structures were obtained without intravenous contrast. Angiographic images of the head were obtained using MRA technique without contrast. COMPARISON:  CT head 11/16/2016.  CTA head and neck 11/16/2016 FINDINGS: MRI HEAD FINDINGS Image quality degraded by moderate motion. Brain: Negative for acute  infarct. Chronic ischemic changes in the white matter. Large chronic lacunar infarction in the left pons. Small chronic infarcts in the right cerebellum. Negative for hemorrhage or mass. Generalized atrophy. Vascular: Normal arterial flow voids. Skull and upper cervical spine: Negative Sinuses/Orbits: Mild mucosal edema in the paranasal sinuses. Normal orbit. Other: None MRA HEAD FINDINGS Image quality degraded by moderate motion. Both vertebral arteries are patent. The basilar is patent. Posterior cerebral arteries patent bilaterally. Fetal origin of the posterior cerebral artery bilaterally. Atherosclerotic irregularity and moderate stenosis in the cavernous carotid bilaterally. Mild to moderate stenosis of the right M1 segment. Right middle cerebral artery branches patent. Left middle cerebral artery widely patent. Anterior distal artery patent bilaterally. 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 IMPRESSION: Negative for acute infarct. Chronic ischemic change in the white matter and left pons MRA is significantly degraded by motion. No large vessel occlusion. Mild to moderate disease cavernous carotid bilaterally and mild stenosis right M1 segment. Right supraclinoid internal carotid artery aneurysm. See CTA report Electronically Signed   By: Marlan Palau M.D.   On: 11/17/2016 10:57   Mr Brain Wo Contrast  Result Date: 11/17/2016 CLINICAL DATA:  Dizziness and giddiness. EXAM: MRI HEAD WITHOUT CONTRAST MRA HEAD WITHOUT CONTRAST TECHNIQUE: Multiplanar, multiecho pulse sequences of the brain and surrounding structures were obtained without intravenous contrast. Angiographic images of the head were obtained using MRA technique without contrast. COMPARISON:  CT head 11/16/2016.  CTA head and neck 11/16/2016 FINDINGS: MRI HEAD FINDINGS Image quality degraded by moderate motion. Brain: Negative for acute infarct. Chronic ischemic changes in the white matter. Large  chronic lacunar infarction in the left pons. Small chronic infarcts in the right cerebellum. Negative for hemorrhage or mass. Generalized atrophy. Vascular: Normal arterial flow voids. Skull and upper cervical spine: Negative Sinuses/Orbits: Mild mucosal edema in the paranasal sinuses. Normal orbit. Other: None MRA HEAD FINDINGS Image quality degraded by moderate motion. Both vertebral arteries are patent. The basilar is patent. Posterior cerebral arteries patent bilaterally. Fetal origin of the posterior cerebral artery bilaterally. Atherosclerotic irregularity and moderate stenosis in the cavernous carotid bilaterally. Mild to moderate stenosis of the right M1 segment. Right middle cerebral artery branches patent. Left middle cerebral artery widely patent. Anterior distal artery patent bilaterally. 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 IMPRESSION: Negative for acute infarct. Chronic ischemic change in the white matter and left pons MRA is significantly degraded by motion. No large vessel occlusion. Mild to moderate disease cavernous carotid bilaterally and mild stenosis right M1 segment. Right supraclinoid internal carotid artery aneurysm. See CTA report Electronically Signed   By: Marlan Palau M.D.   On: 11/17/2016 10:57   Dg Femur Min 2 Views Left  Result Date: 11/16/2016 CLINICAL DATA:  Pain after fall today EXAM: LEFT FEMUR 2 VIEWS COMPARISON:  None. FINDINGS: The knee is flexed on all the images limiting assessment. There is femorotibial joint space narrowing. Likewise there is joint space narrowing of the left hip. No acute displaced fracture  nor dislocation is noted. Vascular calcifications are present. 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 No acute fracture nor dislocations. Electronically Signed   By: Tollie Eth M.D.   On: 11/16/2016 22:59     Management plans discussed with the patient, family and they are in agreement.  CODE  STATUS:     Code Status Orders        Start     Ordered   11/17/16 0437  Full code  Continuous     11/17/16 0437    Code Status History    Date Active Date Inactive Code Status Order ID Comments User Context   09/30/2016 11:37 AM 09/30/2016  4:57 PM Full Code [id number]  Schnier, Latina Craver, MD Inpatient      TOTAL TIME TAKING CARE OF THIS PATIENT: *40* minutes.    Lanissa Cashen M.D on 11/18/2016 at 12:21 PM  Between 7am to 6pm - Pager - [phone] After 6pm go to [email or web address] - Social research officer, government  [facility]  Office  [phone]  CC: Primary care physician; [name], MD

## 2016-11-18 NOTE — Progress Notes (Signed)
[facility] - [place] at [facility]   PATIENT NAME: [name]    MR#:  [id number]  DATE OF BIRTH:  1933/10/15  SUBJECTIVE:   Came in with dizziness and gait instability and a fall from wheelchair. Patient quite sleepy and not the best historian at this time no family in the room REVIEW OF SYSTEMS:   Review of Systems  Unable to perform ROS: Mental status change   Tolerating Diet: Speech therapy pending Tolerating PT: Pending  DRUG ALLERGIES:   Allergies  Allergen Reactions  . Hydrochlorothiazide     Documented on MAR  . Lisinopril     Documented on MAR    VITALS:  Blood pressure (!) 160/81, pulse (!) 101, temperature 98.4 F (36.9 C), resp. rate 18, weight 61.4 kg (135 lb 6.4 oz), SpO2 96 %.  PHYSICAL EXAMINATION:   Physical Exam  GENERAL:  83 y.o.-year-old patient lying in the bed with no acute distress. Chronically ill EYES: Pupils equal, round, reactive to light and accommodation. No scleral icterus. Extraocular muscles intact.  HEENT: Head atraumatic, normocephalic. Oropharynx and nasopharynx clear.  NECK:  Supple, no jugular venous distention. No thyroid enlargement, no tenderness.  LUNGS: Normal breath sounds bilaterally, no wheezing, rales, rhonchi. No use of accessory muscles of respiration.  CARDIOVASCULAR: S1, S2 normal. No murmurs, rubs, or gallops.  ABDOMEN: Soft, nontender, nondistended. Bowel sounds present. No organomegaly or mass.  EXTREMITIES: No cyanosis, clubbing or edema b/l.  Left lower extremity chronic foot ulcer  NEUROLOGIC: Unable to examine in detail to the patient being sleepy. Moves all extremities okay  PSYCHIATRIC:  patient lethargic poor historian SKIN: As above  LABORATORY PANEL:  CBC  Recent Labs Lab 11/16/16 2102  WBC 7.1  HGB 12.0*  HCT 35.4*  PLT 291    Chemistries   Recent Labs Lab 11/16/16 2102  NA 141  K 3.8  CL 108  CO2 25  GLUCOSE 93  BUN 21*  CREATININE 1.18  CALCIUM 9.4  AST  36  ALT 20  ALKPHOS 89  BILITOT 0.4   Cardiac Enzymes  Recent Labs Lab 11/16/16 2102  TROPONINI <0.03   RADIOLOGY:  Ct Angio Head W Or Wo Contrast  Result Date: 11/17/2016 CLINICAL DATA:  83 y/o  M; ataxia. EXAM: CT ANGIOGRAPHY HEAD AND NECK TECHNIQUE: Multidetector CT imaging of the head and neck was performed using the standard protocol during bolus administration of intravenous contrast. Multiplanar CT image reconstructions and MIPs were obtained to evaluate the vascular anatomy. Carotid stenosis measurements (when applicable) are obtained utilizing NASCET criteria, using the distal internal carotid diameter as the denominator. CONTRAST:  75 cc Isovue 370 COMPARISON:  11/16/16 Ct of the head. 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 Right carotid system: No evidence of dissection, stenosis (50% or greater) or occlusion. Severe mixed plaque of the bifurcation without significant stenosis. Left carotid system: No evidence of dissection, stenosis (50% or greater) or occlusion. Severe mixed plaque of the bifurcation with 30% proximal ICA stenosis. Vertebral arteries: Left dominant. Right vertebral artery origin severe stenosis. Skeleton: C5-6 grade 1 anterolisthesis. Severe cervical spondylosis greatest at the C3-4 level there is moderate bony canal stenosis. Other neck: Negative. Upper chest: Severe emphysema of the lung apices. Moderate scarring and bronchiectasis. Review of the MIP images confirms the above findings CTA HEAD FINDINGS Anterior circulation: No proximal occlusion or vascular malformation. Right  ICA broad-based aneurysm superior directed near the terminus measuring 4 x 7 x 3 mm (7:109). Extensive calcific atherosclerosis of the carotid siphons with moderate-to-severe right paraclinoid stenosis and moderate left. Posterior circulation: No significant stenosis, proximal  occlusion, aneurysm, or vascular malformation. Venous sinuses: As permitted by contrast timing, patent. Anatomic variants: Bilateral fetal PCA. Small patent anterior communicating. Delayed phase: No abnormal intracranial enhancement. Review of the MIP images confirms the above findings IMPRESSION: 1. 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 Recommend annual imaging followup by CTA or MRA. This recommendation follows 2010 ACCF/AHA/AATS/ACR/ASA/SCA/SCAI/SIR/STS/SVM Guidelines for the Diagnosis and Management of Patients with Thoracic Aortic Disease. Circulation. 2010; 121: Z610-R604. 2. 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 3. 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 4. Right vertebral artery origin severe stenosis. 5. Severe cervical spondylosis greatest at the C3-4 level there is moderate bony canal stenosis. 6. No proximal occlusion or vascular malformation of the circle of Willis. 7. Right ICA broad-based aneurysm superior directed near the terminus measuring up to 7 mm. 8. Extensive calcific atherosclerosis of the carotid siphons with moderate-to-severe right paraclinoid stenosis and moderate left. Electronically Signed   By: Mitzi Hansen M.D.   On: 11/17/2016 00:03   Dg Chest 1 View  Result Date: 11/16/2016 CLINICAL DATA:  Dizziness and fall today. EXAM: CHEST 1 VIEW COMPARISON:  02/18/2016 and prior radiographs FINDINGS: Cardiomegaly again noted. There is no evidence of focal airspace disease, pulmonary edema, suspicious pulmonary nodule/mass, pleural effusion, or pneumothorax. No acute bony abnormalities are identified. IMPRESSION: No evidence of acute cardiopulmonary disease. Electronically Signed   By: Harmon Pier M.D.   On: 11/16/2016 21:42   Dg Pelvis 1-2 Views  Result Date: 11/16/2016 CLINICAL DATA:  Fall with pelvic pain. EXAM: PELVIS - 1-2 VIEW COMPARISON:  None. FINDINGS: An intratrochanteric  left femur fracture appears remote but correlate clinically. No other fracture, subluxation or dislocation identified. Degenerative changes in the lower lumbar spine and hips are noted. IMPRESSION: Intertrochanteric left femur fracture which appears remote but correlate clinically. Consider dedicated left hip films as indicated. Electronically Signed   By: Harmon Pier M.D.   On: 11/16/2016 21:46   Ct Head Wo Contrast  Result Date: 11/16/2016 CLINICAL DATA:  Dizziness. Weakness since this morning. Fall out of wheelchair. EXAM: CT HEAD WITHOUT CONTRAST TECHNIQUE: Contiguous axial images were obtained from the base of the skull through the vertex without intravenous contrast. COMPARISON:  Head CT and brain MRI 01/03/2014 FINDINGS: Brain: Similar degree of atrophy and chronic small vessel ischemia. Remote lacunar infarct in the right basal ganglia. No evidence of large vessel ischemia, mass effect, or midline shift. No hydrocephalus. No extra-axial or subdural fluid collection. Vascular: Atherosclerosis of skullbase vasculature without hyperdense vessel or abnormal calcification. Skull:  No fracture or focal lesion. Sinuses/Orbits: Chronic opacification of right mastoid air cells. Left mastoid air cells are well-aerated. Mucosal thickening in the left maxillary sinus with minimal debris. Remote left nasal bone fracture. Visualized orbits are unremarkable. Other: None. IMPRESSION: Similar atrophy, chronic small vessel ischemia and remote lacunar infarct in the right basal ganglia. No acute intracranial abnormality. Chronic opacification of right mastoid air cells. Minimal left maxillary sinus disease, possibly acute with intraluminal debris. Electronically Signed   By: Rubye Oaks M.D.   On: 11/16/2016 22:11   Ct Angio Neck W And/or Wo Contrast  Result Date: 11/17/2016 CLINICAL DATA:  83 y/o  M; ataxia. EXAM: CT ANGIOGRAPHY  HEAD AND NECK TECHNIQUE: Multidetector CT imaging of the head and neck was performed  using the standard protocol during bolus administration of intravenous contrast. Multiplanar CT image reconstructions and MIPs were obtained to evaluate the vascular anatomy. Carotid stenosis measurements (when applicable) are obtained utilizing NASCET criteria, using the distal internal carotid diameter as the denominator. CONTRAST:  75 cc Isovue 370 COMPARISON:  11/16/16 Ct of the head. 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 Right carotid system: No evidence of dissection, stenosis (50% or greater) or occlusion. Severe mixed plaque of the bifurcation without significant stenosis. Left carotid system: No evidence of dissection, stenosis (50% or greater) or occlusion. Severe mixed plaque of the bifurcation with 30% proximal ICA stenosis. Vertebral arteries: Left dominant. Right vertebral artery origin severe stenosis. Skeleton: C5-6 grade 1 anterolisthesis. Severe cervical spondylosis greatest at the C3-4 level there is moderate bony canal stenosis. Other neck: Negative. Upper chest: Severe emphysema of the lung apices. Moderate scarring and bronchiectasis. Review of the MIP images confirms the above findings CTA HEAD FINDINGS Anterior circulation: No proximal occlusion or vascular malformation. Right ICA broad-based aneurysm superior directed near the terminus measuring 4 x 7 x 3 mm (7:109). Extensive calcific atherosclerosis of the carotid siphons with moderate-to-severe right paraclinoid stenosis and moderate left. Posterior circulation: No significant stenosis, proximal occlusion, aneurysm, or vascular malformation. Venous sinuses: As permitted by contrast timing, patent. Anatomic variants: Bilateral fetal PCA. Small patent anterior communicating. Delayed phase: No abnormal intracranial enhancement. Review of the MIP images confirms the above findings IMPRESSION: 1. 42 mm distal aortic arch  aneurysm. Recommend annual imaging followup by CTA or MRA. This recommendation follows 2010 ACCF/AHA/AATS/ACR/ASA/SCA/SCAI/SIR/STS/SVM Guidelines for the Diagnosis and Management of Patients with Thoracic Aortic Disease. Circulation. 2010; 121: [id number]. 2. 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 3. 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 4. Right vertebral artery origin severe stenosis. 5. Severe cervical spondylosis greatest at the C3-4 level there is moderate bony canal stenosis. 6. No proximal occlusion or vascular malformation of the circle of Willis. 7. Right ICA broad-based aneurysm superior directed near the terminus measuring up to 7 mm. 8. Extensive calcific atherosclerosis of the carotid siphons with moderate-to-severe right paraclinoid stenosis and moderate left. Electronically Signed   By: Mitzi Hansen M.D.   On: 11/17/2016 00:03   Mr Maxine Glenn Head Wo Contrast  Result Date: 11/17/2016 CLINICAL DATA:  Dizziness and giddiness. EXAM: MRI HEAD WITHOUT CONTRAST MRA HEAD WITHOUT CONTRAST TECHNIQUE: Multiplanar, multiecho pulse sequences of the brain and surrounding structures were obtained without intravenous contrast. Angiographic images of the head were obtained using MRA technique without contrast. COMPARISON:  CT head 11/16/2016.  CTA head and neck 11/16/2016 FINDINGS: MRI HEAD FINDINGS Image quality degraded by moderate motion. Brain: Negative for acute infarct. Chronic ischemic changes in the white matter. Large chronic lacunar infarction in the left pons. Small chronic infarcts in the right cerebellum. Negative for hemorrhage or mass. Generalized atrophy. Vascular: Normal arterial flow voids. Skull and upper cervical spine: Negative Sinuses/Orbits: Mild mucosal edema in the paranasal sinuses. Normal orbit. Other: None MRA HEAD FINDINGS Image quality degraded by moderate motion. Both vertebral  arteries are patent. The basilar is patent. Posterior cerebral arteries patent bilaterally. Fetal origin of the posterior cerebral artery bilaterally. Atherosclerotic irregularity and moderate stenosis in the cavernous carotid bilaterally.  Mild to moderate stenosis of the right M1 segment. Right middle cerebral artery branches patent. Left middle cerebral artery widely patent. Anterior distal artery patent bilaterally. 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 IMPRESSION: Negative for acute infarct. Chronic ischemic change in the white matter and left pons MRA is significantly degraded by motion. No large vessel occlusion. Mild to moderate disease cavernous carotid bilaterally and mild stenosis right M1 segment. Right supraclinoid internal carotid artery aneurysm. See CTA report Electronically Signed   By: Marlan Palauharles  Clark M.D.   On: 11/17/2016 10:57   Mr Brain Wo Contrast  Result Date: 11/17/2016 CLINICAL DATA:  Dizziness and giddiness. EXAM: MRI HEAD WITHOUT CONTRAST MRA HEAD WITHOUT CONTRAST TECHNIQUE: Multiplanar, multiecho pulse sequences of the brain and surrounding structures were obtained without intravenous contrast. Angiographic images of the head were obtained using MRA technique without contrast. COMPARISON:  CT head 11/16/2016.  CTA head and neck 11/16/2016 FINDINGS: MRI HEAD FINDINGS Image quality degraded by moderate motion. Brain: Negative for acute infarct. Chronic ischemic changes in the white matter. Large chronic lacunar infarction in the left pons. Small chronic infarcts in the right cerebellum. Negative for hemorrhage or mass. Generalized atrophy. Vascular: Normal arterial flow voids. Skull and upper cervical spine: Negative Sinuses/Orbits: Mild mucosal edema in the paranasal sinuses. Normal orbit. Other: None MRA HEAD FINDINGS Image quality degraded by moderate motion. Both vertebral arteries are patent. The basilar is patent. Posterior  cerebral arteries patent bilaterally. Fetal origin of the posterior cerebral artery bilaterally. Atherosclerotic irregularity and moderate stenosis in the cavernous carotid bilaterally. Mild to moderate stenosis of the right M1 segment. Right middle cerebral artery branches patent. Left middle cerebral artery widely patent. Anterior distal artery patent bilaterally. 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 IMPRESSION: Negative for acute infarct. Chronic ischemic change in the white matter and left pons MRA is significantly degraded by motion. No large vessel occlusion. Mild to moderate disease cavernous carotid bilaterally and mild stenosis right M1 segment. Right supraclinoid internal carotid artery aneurysm. See CTA report Electronically Signed   By: Marlan Palauharles  Clark M.D.   On: 11/17/2016 10:57   Dg Femur Min 2 Views Left  Result Date: 11/16/2016 CLINICAL DATA:  Pain after fall today EXAM: LEFT FEMUR 2 VIEWS COMPARISON:  None. FINDINGS: The knee is flexed on all the images limiting assessment. There is femorotibial joint space narrowing. Likewise there is joint space narrowing of the left hip. No acute displaced fracture nor dislocation is noted. Vascular calcifications are present. 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 No acute fracture nor dislocations. Electronically Signed   By: Tollie Ethavid  Kwon M.D.   On: 11/16/2016 22:59   ASSESSMENT AND PLAN:   Darrick MeigsLinnies Pettijohn  is a 83 y.o. male with a known history of CVA with left-sided weakness, hypertension, gout, arthritis presented to the emergency room with generalized weakness and dizziness. Patient had 2 falls yesterday and was feeling dizzy. He is mostly wheelchair bound but fell down from the wheelchair while going downhill yesterday. He says he feels dizzy whenever he stands up. Also has some pain in the knee for the last couple of months.  1. Dizziness/gait instability Patient came in after a  fall from his wheelchair yesterday. He is mainly wheelchair-bound. -Neurology consultation appreciated by Dr. Thad Rangereynolds. MRI/MRA brain no acute changes. -Continue aspirin and Plavix  -Speech therapy recommendation appreciated Physical therapy recommends rehab. Pt is WC and  bed bound not sure if he will be able to participate acitively. 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 Gilda Crease recommend no urgent intervention needed.  -appreciate Dr Driscilla Grammes input  3. Hypertension continue home meds   4.BPH on flomax  5.DVT prophylaxis heparin  6. Left femoral intertrochanteric fracture appears remote according to x-ray -We'll continue physical therapy with weightbearing as tolerated and patient complains of pain we'll get CT of the hip  7.Chronic left LE ulcer s/p angioplasty in march 2018 Cont wound care  Spoke with son D/c today  Case discussed with Care Management/Social Worker. Management plans discussed with the patient, family and they are in agreement.  CODE STATUS: FULL   TOTAL TIME TAKING CARE OF THIS PATIENT: *30* minutes.  >50% time spent on counselling and coordination of care  POSSIBLE D/C TO DAY DEPENDING ON CLINICAL CONDITION.  Note: This dictation was prepared with Dragon dictation along with smaller phrase technology. Any transcriptional errors that result from this process are unintentional.  Fuad Forget M.D on 11/18/2016 at 8:07 AM  Between 7am to 6pm - Pager - [phone]  After 6pm go to [email or web address] - Social research officer, government  [facility]  Office  [phone]  CC: Primary care physician; [name], MD

## 2016-11-20 ENCOUNTER — Emergency Department
Admission: EM | Admit: 2016-11-20 | Discharge: 2016-11-20 | Disposition: A | Discharge status: Home / Self Care | Payer: Medicare HMO | Attending: Emergency Medicine | Admitting: Emergency Medicine

## 2016-11-20 ENCOUNTER — Encounter: Payer: Self-pay | Admitting: Emergency Medicine

## 2016-11-20 ENCOUNTER — Emergency Department: Payer: Medicare HMO

## 2016-11-20 DIAGNOSIS — F17210 Nicotine dependence, cigarettes, uncomplicated: Secondary | ICD-10-CM | POA: Insufficient documentation

## 2016-11-20 DIAGNOSIS — X58XXXD Exposure to other specified factors, subsequent encounter: Secondary | ICD-10-CM | POA: Insufficient documentation

## 2016-11-20 DIAGNOSIS — M25571 Pain in right ankle and joints of right foot: Secondary | ICD-10-CM | POA: Diagnosis present

## 2016-11-20 DIAGNOSIS — I10 Essential (primary) hypertension: Secondary | ICD-10-CM | POA: Diagnosis not present

## 2016-11-20 DIAGNOSIS — Z79899 Other long term (current) drug therapy: Secondary | ICD-10-CM | POA: Insufficient documentation

## 2016-11-20 DIAGNOSIS — S91301D Unspecified open wound, right foot, subsequent encounter: Secondary | ICD-10-CM | POA: Diagnosis not present

## 2016-11-20 DIAGNOSIS — T07XXXA Unspecified multiple injuries, initial encounter: Secondary | ICD-10-CM

## 2016-11-20 LAB — SEDIMENTATION RATE: SED RATE: 82 mm/h — AB (ref 0–20)

## 2016-11-20 LAB — BASIC METABOLIC PANEL
ANION GAP: 8 (ref 5–15)
BUN: 37 mg/dL — ABNORMAL HIGH (ref 6–20)
CALCIUM: 8.9 mg/dL (ref 8.9–10.3)
CO2: 27 mmol/L (ref 22–32)
Chloride: 97 mmol/L — ABNORMAL LOW (ref 101–111)
Creatinine, Ser: 1.04 mg/dL (ref 0.61–1.24)
GFR calc Af Amer: >60 mL/min (ref >60)
Glucose, Bld: 100 mg/dL — ABNORMAL HIGH (ref 65–99)
POTASSIUM: 3.5 mmol/L (ref 3.5–5.1)
Sodium: 132 mmol/L — ABNORMAL LOW (ref 135–145)

## 2016-11-20 LAB — CBC WITH DIFFERENTIAL/PLATELET
BASOS ABS: 0.1 10*3/uL (ref 0–0.1)
BASOS PCT: 1 %
EOS ABS: 0.7 10*3/uL (ref 0–0.7)
EOS PCT: 9 %
HCT: 34.4 % — ABNORMAL LOW (ref 40.0–52.0)
Hemoglobin: 11.6 g/dL — ABNORMAL LOW (ref 13.0–18.0)
Lymphocytes Relative: 20 %
Lymphs Abs: 1.6 10*3/uL (ref 1.0–3.6)
MCH: 30 pg (ref 26.0–34.0)
MCHC: 33.8 g/dL (ref 32.0–36.0)
MCV: 88.7 fL (ref 80.0–100.0)
Monocytes Absolute: 1.4 10*3/uL — ABNORMAL HIGH (ref 0.2–1.0)
Monocytes Relative: 18 %
Neutro Abs: 4.2 10*3/uL (ref 1.4–6.5)
Neutrophils Relative %: 52 %
PLATELETS: 294 10*3/uL (ref 150–440)
RBC: 3.88 MIL/uL — AB (ref 4.40–5.90)
RDW: 14.6 % — ABNORMAL HIGH (ref 11.5–14.5)
WBC: 8 10*3/uL (ref 3.8–10.6)

## 2016-11-20 MED ORDER — SULFAMETHOXAZOLE-TRIMETHOPRIM 800-160 MG PO TABS: 1.0000 | ORAL_TABLET | Freq: Two times a day (BID) | ORAL | 0 refills | Status: DC

## 2016-11-20 MED ORDER — CEPHALEXIN 500 MG PO CAPS: 500.0000 mg | ORAL_CAPSULE | Freq: Two times a day (BID) | ORAL | 0 refills | Status: DC

## 2016-11-20 NOTE — Discharge Instructions (Signed)
Your xrays are unremarkable today. Take antibiotics and follow up with primary care and podiatry.

## 2016-11-20 NOTE — ED Triage Notes (Signed)
Pt presents with caregiver. Pt was discharged from here Monday after being treated for right foot injury and ulcer. Pt reports continued and increased pain to right foot. Ulcer noted to right foot, no obvious swelling or drainage noted. Ulcer is malodorous.

## 2016-11-20 NOTE — ED Provider Notes (Signed)
[facility] Emergency Department Provider Note  ____________________________________________  Time seen: Approximately 2:49 PM  I have reviewed the triage vital signs and the nursing notes.   HISTORY  Chief Complaint Foot Ulcer and Foot Pain    HPI [name] Laker Sr. is a 83 y.o. male who report right ankle pain for the past 2 days. He is recently in the hospital after having an injury to the right foot and ankle. He was not discharged on antibiotics at that time. Now he is developed pain in the area. He denies any drainage. Denies any fever. No recurrent injury. Pain is constant, severe, worse with weightbearing no significant alleviating factors. Pain is aching.     Past Medical History:  Diagnosis Date  . Arthritis   . Gout   . Hypertension   . Stroke [facility])    left sided weakness     Patient Active Problem List   Diagnosis Date Noted  . Gait instability 11/17/2016  . Lower extremity pain, left 09/17/2016  . PAD (peripheral artery disease) (HCC) 09/17/2016  . Tobacco dependence 09/17/2016     Past Surgical History:  Procedure Laterality Date  . INGUINAL HERNIA REPAIR Right 09/21/2015   Procedure: HERNIA REPAIR INGUINAL ADULT;  Surgeon: [name], MD;  Location: [facility] ORS;  Service: General;  Laterality: Right;  . LOWER EXTREMITY ANGIOGRAPHY Left 09/30/2016   Procedure: Lower Extremity Angiography;  Surgeon: [name], MD;  Location: [facility] INVASIVE CV LAB;  Service: Cardiovascular;  Laterality: Left;     Prior to Admission medications   Medication Sig Start Date End Date Taking? Authorizing Provider  acetaminophen (TYLENOL) 325 MG tablet Take 650 mg by mouth every 6 (six) hours as needed for moderate pain.    [provider]  albuterol (PROVENTIL HFA;VENTOLIN HFA) 108 (90 Base) MCG/ACT inhaler Inhale 2 puffs into the lungs every 6 (six) hours as needed for wheezing or shortness of breath. 02/18/16   Minna Antis,  MD  amLODipine (NORVASC) 5 MG tablet Take 5 mg by mouth daily.    [provider]  aspirin EC 81 MG tablet Take 81 mg by mouth daily.    [provider]  cephALEXin (KEFLEX) 500 MG capsule Take 1 capsule (500 mg total) by mouth 2 (two) times daily. 11/20/16   Sharman Cheek, MD  clopidogrel (PLAVIX) 75 MG tablet Take 1 tablet (75 mg total) by mouth daily. 10/01/16   Schnier, Latina Craver, MD  docusate sodium (COLACE) 100 MG capsule Take 100 mg by mouth 2 (two) times daily.    [provider]  DULoxetine (CYMBALTA) 30 MG capsule Take 30 mg by mouth daily.    [provider]  febuxostat (ULORIC) 40 MG tablet Take 40 mg by mouth daily.    [provider]  feeding supplement, ENSURE ENLIVE, (ENSURE ENLIVE) LIQD Take 237 mLs by mouth 3 (three) times daily between meals. 11/18/16   Enedina Finner, MD  hypromellose (GENTEAL SEVERE) 0.3 % GEL ophthalmic ointment Place 1 application into the left eye as needed (irritation/watering).     [provider]  ipratropium (ATROVENT) 0.03 % nasal spray Place 1 spray into both nostrils every 4 (four) hours as needed (congestion).     [provider]  loratadine (CLARITIN) 10 MG tablet Take 10 mg by mouth every evening.     [provider]  lovastatin (MEVACOR) 20 MG tablet Take 20 mg by mouth every evening.    [provider]  meloxicam [facility])  7.5 MG tablet Take 7.5 mg by mouth every evening.     [provider]  sulfamethoxazole-trimethoprim (BACTRIM DS) 800-160 MG tablet Take 1 tablet by mouth 2 (two) times daily. 11/20/16   Sharman CheekStafford, Ajmal Kathan, MD  tamsulosin (FLOMAX) 0.4 MG CAPS capsule Take 1 capsule (0.4 mg total) by mouth daily. 03/14/16   Michiel CowboyMcGowan, Shannon A, PA-C     Allergies Hydrochlorothiazide and Lisinopril   Family History  Problem Relation Age of Onset  . Kidney disease Neg Hx   . Prostate cancer Neg Hx     Social History Social History  Substance Use Topics   . Smoking status: Current Every Day Smoker    Packs/day: 1.00    Years: 50.00    Types: Cigarettes  . Smokeless tobacco: Never Used  . Alcohol use No     Comment: previous heavy drinker now 1x month    Review of Systems  Constitutional:   No fever or chills.  ENT:   No sore throat. No rhinorrhea. Cardiovascular:   No chest pain or syncope. Respiratory:   No dyspnea or cough. Gastrointestinal:   Negative for abdominal pain, vomiting and diarrhea.  Musculoskeletal:   Right ankle pain and swelling All other systems reviewed and are negative except as documented above in ROS and HPI.  ____________________________________________   PHYSICAL EXAM:  VITAL SIGNS: ED Triage Vitals  Enc Vitals Group     BP 11/20/16 1219 (!) 159/62     Pulse Rate 11/20/16 1219 88     Resp 11/20/16 1219 18     Temp 11/20/16 1219 98.5 F (36.9 C)     Temp Source 11/20/16 1219 Oral     SpO2 11/20/16 1219 100 %     Weight 11/20/16 1220 130 lb (59 kg)     Height 11/20/16 1220 5\' 2"  (1.575 m)     Head Circumference --      Peak Flow --      Pain Score 11/20/16 1219 10     Pain Loc --      Pain Edu? --      Excl. in GC? --     Vital signs reviewed, nursing assessments reviewed.   Constitutional:   Alert and oriented. Well appearing and in no distress. Eyes:   No scleral icterus. No conjunctival pallor. PERRL. EOMI.  No nystagmus. ENT   Head:   Normocephalic and atraumatic.   Nose:   No congestion/rhinnorhea.    Mouth/Throat:   MMM, no pharyngeal erythema. No peritonsillar mass.    Neck:   No meningismus. Full ROM Hematological/Lymphatic/Immunilogical:   No cervical lymphadenopathy. Cardiovascular:   RRR. Symmetric bilateral radial and DP pulses.  No murmurs.  Respiratory:   Normal respiratory effort without tachypnea/retractions. Breath sounds are clear and equal bilaterally. No wheezes/rales/rhonchi. Gastrointestinal:   Soft and nontender. Non distended. There is no CVA  tenderness.  No rebound, rigidity, or guarding. Genitourinary:   deferred Musculoskeletal:   Normal range of motion in all extremities. No joint effusions.  Tenderness over the lateral malleolus. There is a 4 x 4 centimeter round wound with rolled edges and necrotic center just distal to the lateral malleolus. No clear bony exposure. There is a small 3 x 3 cm around wound in the lateral midfoot. Appears to be healing with small amount of fibrinous tissue in the wound. Foul-smelling Neurologic:   Normal speech and language.   Motor at baseline No gross focal neurologic deficits are appreciated.  Skin:    Skin  is warm, dry with wounds as noted above  ____________________________________________    LABS (pertinent positives/negatives) (all labs ordered are listed, but only abnormal results are displayed) Labs Reviewed  CBC WITH DIFFERENTIAL/PLATELET - Abnormal; Notable for the following:       Result Value   RBC 3.88 (*)    Hemoglobin 11.6 (*)    HCT 34.4 (*)    RDW 14.6 (*)    Monocytes Absolute 1.4 (*)    All other components within normal limits  BASIC METABOLIC PANEL - Abnormal; Notable for the following:    Sodium 132 (*)    Chloride 97 (*)    Glucose, Bld 100 (*)    BUN 37 (*)    All other components within normal limits  SEDIMENTATION RATE - Abnormal; Notable for the following:    Sed Rate 82 (*)    All other components within normal limits   ____________________________________________   EKG    ____________________________________________    RADIOLOGY  Dg Ankle Complete Right  Result Date: 11/20/2016 CLINICAL DATA:  Initial evaluation for acute right ankle pain and swelling. Wound at lateral aspect of ankle. EXAM: RIGHT ANKLE - COMPLETE 3+ VIEW COMPARISON:  None. FINDINGS: Mild soft tissue irregularity just inferior to the lateral malleolus suspicious for soft tissue ulceration. No radiographic evidence for acute osteomyelitis. No acute fracture dislocation. Bones  are osteopenic. Scattered vascular calcifications noted. IMPRESSION: 1. Soft tissue ulceration adjacent to the lateral malleolus. No radiographic evidence for acute osteomyelitis. 2. No other acute osseous abnormality about the right ankle. Electronically Signed   By: Rise Mu M.D.   On: 11/20/2016 14:58   Dg Foot Complete Right  Result Date: 11/20/2016 CLINICAL DATA:  Lateral right ankle cutaneous ulcer. EXAM: RIGHT FOOT COMPLETE - 3+ VIEW COMPARISON:  None in PACs FINDINGS: The bones are subjectively osteopenic. AP view of the foot is not included in this series. There is mild narrowing of the interphalangeal joints diffusely. There is mild narrowing of the first MTP joint. The other MTP joints appear normal. The metatarsals are intact as are the tarsal bones. No significant joint space loss of the intertarsal or tarsometatarsal joints is observed. No soft tissue gas collections are demonstrated. IMPRESSION: No radiographic evidence of osteomyelitis. If the cutaneous ulcer is indeed present over the lateral malleolar region, an ankle series would be useful in evaluating the depth of the ulcer and any change in the adjacent bony structures that might indicate osteomyelitis. Electronically Signed   By: David  [place] M.D.   On: 11/20/2016 13:11    ____________________________________________   PROCEDURES Procedures  ____________________________________________   INITIAL IMPRESSION / ASSESSMENT AND PLAN / ED COURSE  Pertinent labs & imaging results that were available during my care of the patient were reviewed by me and considered in my medical decision making (see chart for details).  Patient presents with right ankle pain and swelling with 2 open wounds show some evidence of infection. We'll check ankle x-ray. If negative, we'll start the patient on antibiotics and have him follow-up with podiatry or wound center. Patient is not diabetic. No evidence of abscess or necrotizing  fasciitis.      ----------------------------------------- 3:25 PM on 11/20/2016 -----------------------------------------  X-ray negative. ____________________________________________   FINAL CLINICAL IMPRESSION(S) / ED DIAGNOSES  Final diagnoses:  Acute right ankle pain  Multiple open wounds      New Prescriptions   CEPHALEXIN (KEFLEX) 500 MG CAPSULE    Take 1 capsule (500 mg total) by mouth 2 (  two) times daily.   SULFAMETHOXAZOLE-TRIMETHOPRIM (BACTRIM DS) 800-160 MG TABLET    Take 1 tablet by mouth 2 (two) times daily.     Portions of this note were generated with dragon dictation software. Dictation errors may occur despite best attempts at proofreading.    Sharman Cheek, MD 11/20/16 1525

## 2016-11-20 NOTE — ED Notes (Signed)
Patient has 2 wounds to the right foot.   Wound 1 is on the top right side of the foot. It is 4cm x 4cm. White/pink in color. No frank drainage but some on the bandage.   Wound 2 is on the outer ankle of the right foot. It is 3cm x 3cm. White and pink in color. No frank drainage.  Patient states his whole foot hurts and one wound doesn't hurt worse than the other. Wounds does have an odor.

## 2016-11-20 NOTE — ED Notes (Signed)
Patients caregiver was very pushy on getting out of ED.

## 2016-11-20 NOTE — ED Notes (Signed)
Patients care giver at door wanting to talk with EDP. EDP notified.

## 2016-11-28 ENCOUNTER — Emergency Department: Payer: Medicare HMO

## 2016-11-28 ENCOUNTER — Inpatient Hospital Stay
Admission: EM | Admit: 2016-11-28 | Discharge: 2016-12-04 | DRG: 683 | Disposition: A | Discharge status: Skilled Nursing Facility | Payer: Medicare HMO | Attending: Internal Medicine | Admitting: Internal Medicine

## 2016-11-28 ENCOUNTER — Encounter: Payer: Self-pay | Admitting: Emergency Medicine

## 2016-11-28 DIAGNOSIS — G8929 Other chronic pain: Secondary | ICD-10-CM

## 2016-11-28 DIAGNOSIS — E875 Hyperkalemia: Secondary | ICD-10-CM

## 2016-11-28 DIAGNOSIS — I2510 Atherosclerotic heart disease of native coronary artery without angina pectoris: Secondary | ICD-10-CM | POA: Diagnosis present

## 2016-11-28 DIAGNOSIS — I10 Essential (primary) hypertension: Secondary | ICD-10-CM | POA: Diagnosis present

## 2016-11-28 DIAGNOSIS — E860 Dehydration: Secondary | ICD-10-CM

## 2016-11-28 DIAGNOSIS — M25562 Pain in left knee: Secondary | ICD-10-CM | POA: Diagnosis present

## 2016-11-28 DIAGNOSIS — E871 Hypo-osmolality and hyponatremia: Secondary | ICD-10-CM

## 2016-11-28 DIAGNOSIS — R4182 Altered mental status, unspecified: Secondary | ICD-10-CM

## 2016-11-28 DIAGNOSIS — Z7982 Long term (current) use of aspirin: Secondary | ICD-10-CM | POA: Diagnosis not present

## 2016-11-28 DIAGNOSIS — M25569 Pain in unspecified knee: Secondary | ICD-10-CM

## 2016-11-28 DIAGNOSIS — M25561 Pain in right knee: Secondary | ICD-10-CM | POA: Diagnosis present

## 2016-11-28 DIAGNOSIS — Z79899 Other long term (current) drug therapy: Secondary | ICD-10-CM | POA: Diagnosis not present

## 2016-11-28 DIAGNOSIS — L97519 Non-pressure chronic ulcer of other part of right foot with unspecified severity: Secondary | ICD-10-CM | POA: Diagnosis present

## 2016-11-28 DIAGNOSIS — I69354 Hemiplegia and hemiparesis following cerebral infarction affecting left non-dominant side: Secondary | ICD-10-CM | POA: Diagnosis not present

## 2016-11-28 DIAGNOSIS — M109 Gout, unspecified: Secondary | ICD-10-CM | POA: Diagnosis present

## 2016-11-28 DIAGNOSIS — N179 Acute kidney failure, unspecified: Principal | ICD-10-CM

## 2016-11-28 DIAGNOSIS — Z7902 Long term (current) use of antithrombotics/antiplatelets: Secondary | ICD-10-CM

## 2016-11-28 DIAGNOSIS — Z8673 Personal history of transient ischemic attack (TIA), and cerebral infarction without residual deficits: Secondary | ICD-10-CM

## 2016-11-28 LAB — CBC WITH DIFFERENTIAL/PLATELET
Basophils Absolute: 0.1 10*3/uL (ref 0–0.1)
Basophils Relative: 1 %
Eosinophils Absolute: 0.2 10*3/uL (ref 0–0.7)
Eosinophils Relative: 3 %
HCT: 33.2 % — ABNORMAL LOW (ref 40.0–52.0)
Hemoglobin: 11.4 g/dL — ABNORMAL LOW (ref 13.0–18.0)
Lymphocytes Relative: 21 %
Lymphs Abs: 1.3 10*3/uL (ref 1.0–3.6)
MCH: 29.6 pg (ref 26.0–34.0)
MCHC: 34.3 g/dL (ref 32.0–36.0)
MCV: 86.5 fL (ref 80.0–100.0)
Monocytes Absolute: 1 10*3/uL (ref 0.2–1.0)
Monocytes Relative: 16 %
Neutro Abs: 3.5 10*3/uL (ref 1.4–6.5)
Neutrophils Relative %: 59 %
Platelets: 349 10*3/uL (ref 150–440)
RBC: 3.84 MIL/uL — ABNORMAL LOW (ref 4.40–5.90)
RDW: 13.8 % (ref 11.5–14.5)
WBC: 6 10*3/uL (ref 3.8–10.6)

## 2016-11-28 LAB — COMPREHENSIVE METABOLIC PANEL WITH GFR
ALT: 25 U/L (ref 17–63)
AST: 76 U/L — ABNORMAL HIGH (ref 15–41)
Albumin: 3.6 g/dL (ref 3.5–5.0)
Alkaline Phosphatase: 96 U/L (ref 38–126)
Anion gap: 11 (ref 5–15)
BUN: 30 mg/dL — ABNORMAL HIGH (ref 6–20)
CO2: 19 mmol/L — ABNORMAL LOW (ref 22–32)
Calcium: 9.1 mg/dL (ref 8.9–10.3)
Chloride: 98 mmol/L — ABNORMAL LOW (ref 101–111)
Creatinine, Ser: 1.66 mg/dL — ABNORMAL HIGH (ref 0.61–1.24)
GFR calc Af Amer: 43 mL/min — ABNORMAL LOW
GFR calc non Af Amer: 37 mL/min — ABNORMAL LOW
Glucose, Bld: 63 mg/dL — ABNORMAL LOW (ref 65–99)
Potassium: 5.4 mmol/L — ABNORMAL HIGH (ref 3.5–5.1)
Sodium: 128 mmol/L — ABNORMAL LOW (ref 135–145)
Total Bilirubin: 0.5 mg/dL (ref 0.3–1.2)
Total Protein: 8.3 g/dL — ABNORMAL HIGH (ref 6.5–8.1)

## 2016-11-28 LAB — URINALYSIS, COMPLETE (UACMP) WITH MICROSCOPIC
Bacteria, UA: NONE SEEN
Bilirubin Urine: NEGATIVE
Glucose, UA: NEGATIVE mg/dL
Hgb urine dipstick: NEGATIVE
KETONES UR: NEGATIVE mg/dL
Leukocytes, UA: NEGATIVE
Nitrite: NEGATIVE
PH: 5 (ref 5.0–8.0)
Protein, ur: NEGATIVE mg/dL
Specific Gravity, Urine: 1.016 (ref 1.005–1.030)

## 2016-11-28 LAB — LACTIC ACID, PLASMA: Lactic Acid, Venous: 1.3 mmol/L (ref 0.5–1.9)

## 2016-11-28 LAB — GLUCOSE, CAPILLARY: Glucose-Capillary: 67 mg/dL (ref 65–99)

## 2016-11-28 LAB — MRSA PCR SCREENING: MRSA by PCR: NEGATIVE

## 2016-11-28 MED ORDER — HEPARIN SODIUM (PORCINE) 5000 UNIT/ML IJ SOLN
5000.0000 [IU] | Freq: Three times a day (TID) | INTRAMUSCULAR | Status: DC
  Administered 2016-11-28 – 2016-12-04 (×14): 5000 [IU] via SUBCUTANEOUS
  Filled 2016-11-28 (×14): qty 1

## 2016-11-28 MED ORDER — CLOPIDOGREL BISULFATE 75 MG PO TABS
75.0000 mg | ORAL_TABLET | Freq: Every day | ORAL | Status: DC
  Administered 2016-11-29 – 2016-12-04 (×6): 75 mg via ORAL
  Filled 2016-11-28 (×6): qty 1

## 2016-11-28 MED ORDER — ACETAMINOPHEN 325 MG PO TABS
650.0000 mg | ORAL_TABLET | Freq: Four times a day (QID) | ORAL | Status: DC | PRN
  Administered 2016-11-29 – 2016-12-01 (×5): 650 mg via ORAL
  Filled 2016-11-28 (×5): qty 2

## 2016-11-28 MED ORDER — SODIUM CHLORIDE 0.9 % IV BOLUS (SEPSIS)
500.0000 mL | Freq: Once | INTRAVENOUS | Status: AC
  Administered 2016-11-28: 500 mL via INTRAVENOUS

## 2016-11-28 MED ORDER — OXYCODONE-ACETAMINOPHEN 5-325 MG PO TABS
1.0000 | ORAL_TABLET | Freq: Once | ORAL | Status: AC
  Administered 2016-11-28: 1 via ORAL
  Filled 2016-11-28: qty 1

## 2016-11-28 MED ORDER — TAMSULOSIN HCL 0.4 MG PO CAPS
0.4000 mg | ORAL_CAPSULE | Freq: Every day | ORAL | Status: DC
  Administered 2016-11-29 – 2016-12-04 (×6): 0.4 mg via ORAL
  Filled 2016-11-28 (×6): qty 1

## 2016-11-28 MED ORDER — IPRATROPIUM BROMIDE 0.03 % NA SOLN
1.0000 | NASAL | Status: DC | PRN
  Filled 2016-11-28: qty 30

## 2016-11-28 MED ORDER — SODIUM POLYSTYRENE SULFONATE 15 GM/60ML PO SUSP
30.0000 g | Freq: Once | ORAL | Status: AC
  Administered 2016-11-28: 30 g via ORAL
  Filled 2016-11-28: qty 120

## 2016-11-28 MED ORDER — DULOXETINE HCL 30 MG PO CPEP
30.0000 mg | ORAL_CAPSULE | Freq: Two times a day (BID) | ORAL | Status: DC
  Administered 2016-11-28 – 2016-12-02 (×8): 30 mg via ORAL
  Filled 2016-11-28 (×8): qty 1

## 2016-11-28 MED ORDER — PRAVASTATIN SODIUM 20 MG PO TABS
20.0000 mg | ORAL_TABLET | Freq: Every day | ORAL | Status: DC
  Administered 2016-11-28 – 2016-12-03 (×6): 20 mg via ORAL
  Filled 2016-11-28 (×6): qty 1

## 2016-11-28 MED ORDER — ASPIRIN EC 81 MG PO TBEC
81.0000 mg | DELAYED_RELEASE_TABLET | Freq: Every day | ORAL | Status: DC
  Administered 2016-11-29 – 2016-12-04 (×6): 81 mg via ORAL
  Filled 2016-11-28 (×6): qty 1

## 2016-11-28 MED ORDER — SODIUM CHLORIDE 0.9 % IV SOLN
INTRAVENOUS | Status: DC
  Administered 2016-11-28: 1000 mL via INTRAVENOUS
  Administered 2016-11-29 – 2016-11-30 (×3): via INTRAVENOUS

## 2016-11-28 MED ORDER — ENSURE ENLIVE PO LIQD
237.0000 mL | Freq: Three times a day (TID) | ORAL | Status: DC
  Administered 2016-11-28 – 2016-12-04 (×15): 237 mL via ORAL

## 2016-11-28 MED ORDER — DOCUSATE SODIUM 100 MG PO CAPS
100.0000 mg | ORAL_CAPSULE | Freq: Two times a day (BID) | ORAL | Status: DC | PRN
  Filled 2016-11-28: qty 1

## 2016-11-28 MED ORDER — DOCUSATE SODIUM 100 MG PO CAPS
100.0000 mg | ORAL_CAPSULE | Freq: Two times a day (BID) | ORAL | Status: DC
  Administered 2016-11-28 – 2016-12-04 (×10): 100 mg via ORAL
  Filled 2016-11-28 (×9): qty 1

## 2016-11-28 NOTE — ED Triage Notes (Signed)
Pt brought in by [facility] from [facility] for right knee pain x 1 month. Pt has 2 open wounds on his right foot, unknown how long it has been there.

## 2016-11-28 NOTE — H&P (Signed)
[facility] [place] [facility]   PATIENT NAME: [name]    MR#:  [id number]  DATE OF BIRTH:  1934/05/27  DATE OF ADMISSION:  11/28/2016  PRIMARY CARE PHYSICIAN: [name], MD   REQUESTING/REFERRING PHYSICIAN: McShane  CHIEF COMPLAINT:   Chief Complaint  Patient presents with  . Knee Pain    HISTORY OF PRESENT ILLNESS: [name]  is a 83 y.o. male with a known history of Gout, hypertension, stroke, left-sided weakness- was admitted to hospital last week with complaint of dizziness and a fall and after workup noted not a significant. He was sent back to his facility, he is under the department of social services care. Today he is sent here and patient is not able to give much history as he is confused. In workup in ER he is noted to be dehydrated with acute renal failure and elevated potassium. I reviewed his medications list at the facility and it shows that he was on Keflex and bactrim for last 5 days over there, but I did not find any diagnosis.  PAST MEDICAL HISTORY:   Past Medical History:  Diagnosis Date  . Arthritis   . Gout   . Hypertension   . Stroke [facility])    left sided weakness    PAST SURGICAL HISTORY: Past Surgical History:  Procedure Laterality Date  . INGUINAL HERNIA REPAIR Right 09/21/2015   Procedure: HERNIA REPAIR INGUINAL ADULT;  Surgeon: [name], MD;  Location: [facility] ORS;  Service: General;  Laterality: Right;  . LOWER EXTREMITY ANGIOGRAPHY Left 09/30/2016   Procedure: Lower Extremity Angiography;  Surgeon: [name], MD;  Location: [facility] INVASIVE CV LAB;  Service: Cardiovascular;  Laterality: Left;    SOCIAL HISTORY:  Social History  Substance Use Topics  . Smoking status: Current Every Day Smoker    Packs/day: 1.00    Years: 50.00    Types: Cigarettes  . Smokeless tobacco: Never Used  . Alcohol use No     Comment: previous heavy drinker now 1x month    FAMILY HISTORY:  Family History  Problem  Relation Age of Onset  . Kidney disease Neg Hx   . Prostate cancer Neg Hx     DRUG ALLERGIES:  Allergies  Allergen Reactions  . Hydrochlorothiazide     Documented on MAR  . Lisinopril     Documented on MAR    REVIEW OF SYSTEMS:   Patient is confused and cannot give any history.  MEDICATIONS AT HOME:  Prior to Admission medications   Medication Sig Start Date End Date Taking? Authorizing Provider  acetaminophen (TYLENOL) 325 MG tablet Take 650 mg by mouth every 6 (six) hours as needed for moderate pain.   Yes [provider]  albuterol (PROVENTIL HFA;VENTOLIN HFA) 108 (90 Base) MCG/ACT inhaler Inhale 2 puffs into the lungs every 6 (six) hours as needed for wheezing or shortness of breath. 02/18/16  Yes Paduchowski, Caryn Bee, MD  amLODipine (NORVASC) 5 MG tablet Take 5 mg by mouth daily.   Yes [provider]  aspirin EC 81 MG tablet Take 81 mg by mouth daily.   Yes [provider]  cephALEXin (KEFLEX) 500 MG capsule Take 1 capsule (500 mg total) by mouth 2 (two) times daily. 11/20/16  Yes Sharman Cheek, MD  clopidogrel (PLAVIX) 75 MG tablet Take 1 tablet (75 mg total) by mouth daily. 10/01/16  Yes Schnier, Latina Craver, MD  docusate sodium (COLACE) 100 MG capsule Take 100 mg by  mouth 2 (two) times daily.   Yes [provider]  DULoxetine (CYMBALTA) 30 MG capsule Take 30 mg by mouth 2 (two) times daily.    Yes [provider]  febuxostat (ULORIC) 40 MG tablet Take 40 mg by mouth daily.   Yes [provider]  hypromellose (GENTEAL SEVERE) 0.3 % GEL ophthalmic ointment Place 1 application into the left eye as needed (irritation/watering).    Yes [provider]  ipratropium (ATROVENT) 0.03 % nasal spray Place 1 spray into both nostrils every 4 (four) hours as needed (congestion).    Yes [provider]  loratadine (CLARITIN) 10 MG tablet Take 10 mg by mouth every evening.    Yes [provider]  lovastatin  (MEVACOR) 20 MG tablet Take 20 mg by mouth every evening.   Yes [provider]  meloxicam (MOBIC) 7.5 MG tablet Take 7.5 mg by mouth every evening.    Yes [provider]  tamsulosin (FLOMAX) 0.4 MG CAPS capsule Take 1 capsule (0.4 mg total) by mouth daily. 03/14/16  Yes McGowan, Carollee HerterShannon A, PA-C  feeding supplement, ENSURE ENLIVE, (ENSURE ENLIVE) LIQD Take 237 mLs by mouth 3 (three) times daily between meals. 11/18/16   Enedina FinnerPatel, Sona, MD  sulfamethoxazole-trimethoprim (BACTRIM DS) 800-160 MG tablet Take 1 tablet by mouth 2 (two) times daily. Patient not taking: Reported on 11/28/2016 11/20/16   Sharman CheekStafford, Phillip, MD      PHYSICAL EXAMINATION:   VITAL SIGNS: Blood pressure 121/67, pulse 85, temperature 97.9 F (36.6 C), temperature source Oral, resp. rate 18, SpO2 98 %.  GENERAL:  83 y.o.-year-old patient lying in the bed with no acute distress.  EYES: Pupils equal, round, reactive to light and accommodation. No scleral icterus. Extraocular muscles intact.  HEENT: Head atraumatic, normocephalic. Oropharynx and nasopharynx clear.  NECK:  Supple, no jugular venous distention. No thyroid enlargement, no tenderness.  LUNGS: Normal breath sounds bilaterally, no wheezing, rales,rhonchi or crepitation. No use of accessory muscles of respiration.  CARDIOVASCULAR: S1, S2 normal. No murmurs, rubs, or gallops.  ABDOMEN: Soft, nontender, nondistended. Bowel sounds present. No organomegaly or mass.  EXTREMITIES: No pedal edema, cyanosis, or clubbing. On the right ankle and foot there are some chronic-looking ulcers, does not seem to be infected. NEUROLOGIC: Cranial nerves II through XII are intact. Muscle strength 5/5 in all extremities. Sensation intact. Gait not checked.  PSYCHIATRIC: The patient is alert and oriented x 0.  SKIN: No obvious rash, lesion, or ulcer.   LABORATORY PANEL:   CBC  Recent Labs Lab 11/28/16 1220  WBC 6.0  HGB 11.4*  HCT 33.2*  PLT 349  MCV 86.5  MCH 29.6   MCHC 34.3  RDW 13.8  LYMPHSABS 1.3  MONOABS 1.0  EOSABS 0.2  BASOSABS 0.1   ------------------------------------------------------------------------------------------------------------------  Chemistries   Recent Labs Lab 11/28/16 1220  NA 128*  K 5.4*  CL 98*  CO2 19*  GLUCOSE 63*  BUN 30*  CREATININE 1.66*  CALCIUM 9.1  AST 76*  ALT 25  ALKPHOS 96  BILITOT 0.5   ------------------------------------------------------------------------------------------------------------------ estimated creatinine clearance is 27.4 mL/min (A) (by C-G formula based on SCr of 1.66 mg/dL (H)). ------------------------------------------------------------------------------------------------------------------ No results for input(s): TSH, T4TOTAL, T3FREE, THYROIDAB in the last 72 hours.  Invalid input(s): FREET3   Coagulation profile No results for input(s): INR, PROTIME in the last 168 hours. ------------------------------------------------------------------------------------------------------------------- No results for input(s): DDIMER in the last 72 hours. -------------------------------------------------------------------------------------------------------------------  Cardiac Enzymes No results for input(s): CKMB, TROPONINI, MYOGLOBIN in the last  168 hours.  Invalid input(s): CK ------------------------------------------------------------------------------------------------------------------ Invalid input(s): POCBNP  ---------------------------------------------------------------------------------------------------------------  Urinalysis    Component Value Date/Time   COLORURINE STRAW (A) 11/16/2016 2130   APPEARANCEUR CLEAR (A) 11/16/2016 2130   APPEARANCEUR Clear 01/03/2014 2234   LABSPEC 1.008 11/16/2016 2130   LABSPEC 1.008 01/03/2014 2234   PHURINE 5.0 11/16/2016 2130   GLUCOSEU NEGATIVE 11/16/2016 2130   GLUCOSEU Negative 01/03/2014 2234   HGBUR NEGATIVE  11/16/2016 2130   BILIRUBINUR NEGATIVE 11/16/2016 2130   BILIRUBINUR Negative 01/03/2014 2234   KETONESUR NEGATIVE 11/16/2016 2130   PROTEINUR NEGATIVE 11/16/2016 2130   UROBILINOGEN 1.0 11/24/2009 1915   NITRITE NEGATIVE 11/16/2016 2130   LEUKOCYTESUR NEGATIVE 11/16/2016 2130   LEUKOCYTESUR Negative 01/03/2014 2234     RADIOLOGY: Dg Knee 1-2 Views Left  Result Date: 11/28/2016 CLINICAL DATA:  Left knee pain. EXAM: LEFT KNEE - 1-2 VIEW COMPARISON:  None. FINDINGS: There is severe narrowing of the medial compartment of the left knee with marginal osteophyte formation in the medial compartment. There is no appreciable fracture. No dislocation. No appreciable joint effusion. IMPRESSION: Severe arthritis of the medial compartment of the left knee. No acute abnormalities. Electronically Signed   By: Francene Boyers M.D.   On: 11/28/2016 13:56    EKG: Orders placed or performed during the hospital encounter of 02/18/16  . EKG 12-Lead  . EKG 12-Lead    IMPRESSION AND PLAN:  * Acute renal failure   Likely secondary to dehydration   We'll give IV fluids, and monitor renal function.  * Hyperkalemia   Give Kayexalate and monitor tomorrow.  * Altered mental status   Patient was on some antibiotics for last 5 days at her living facility   I will check urinalysis, patient is not able to give any history.  * Hyponatremia   Likely due to dehydration, check with IV fluids.  * Hypertension   Continue home meds.  * History of coronary artery disease    Continue aspirin and Plavix and statin.     All the records are reviewed and case discussed with ED provider. Management plans discussed with the patient, family and they are in agreement.  CODE STATUS: 4 code  Code Status History    Date Active Date Inactive Code Status Order ID Comments User Context   11/17/2016  4:37 AM 11/18/2016 10:04 PM Full Code [id number]  Ihor Austin, MD Inpatient   09/30/2016 11:37 AM 09/30/2016  4:57 PM  Full Code [id number]  Schnier, Latina Craver, MD Inpatient       TOTAL TIME TAKING CARE OF THIS PATIENT: 45 minutes.    Altamese Dilling M.D on 11/28/2016   Between 7am to 6pm - Pager - [phone]  After 6pm go to [email or web address] - password [email or web address]  [facility]  Office  [phone]  CC: Primary care physician; [name], MD   Note: This dictation was prepared with Dragon dictation along with smaller phrase technology. Any transcriptional errors that result from this process are unintentional.

## 2016-11-28 NOTE — ED Provider Notes (Addendum)
[facility] Emergency Department Provider Note  ____________________________________________   I have reviewed the triage vital signs and the nursing notes.   HISTORY  Chief Complaint Knee Pain    HPI [name] Lockner Sr. is a 83 y.o. male who presents today complaining of knee pain for "years". He also states he has been eating and drinking much as he hasn't had much appetite. Denies any fever or chills. He denies any fall, he denies any hip pain. He does have chronic ulcers on his left lower extremity. There for some time. He has been evaluated for them before.Most recently about a week ago.     Past Medical History:  Diagnosis Date  . Arthritis   . Gout   . Hypertension   . Stroke [facility](HCC)    left sided weakness    Patient Active Problem List   Diagnosis Date Noted  . Gait instability 11/17/2016  . Lower extremity pain, left 09/17/2016  . PAD (peripheral artery disease) (HCC) 09/17/2016  . Tobacco dependence 09/17/2016    Past Surgical History:  Procedure Laterality Date  . INGUINAL HERNIA REPAIR Right 09/21/2015   Procedure: HERNIA REPAIR INGUINAL ADULT;  Surgeon: Nadeen LandauJarvis Wilton Smith, MD;  Location: [facility] ORS;  Service: General;  Laterality: Right;  . LOWER EXTREMITY ANGIOGRAPHY Left 09/30/2016   Procedure: Lower Extremity Angiography;  Surgeon: Renford DillsGregory G Schnier, MD;  Location: [facility] INVASIVE CV LAB;  Service: Cardiovascular;  Laterality: Left;    Prior to Admission medications   Medication Sig Start Date End Date Taking? Authorizing Provider  acetaminophen (TYLENOL) 325 MG tablet Take 650 mg by mouth every 6 (six) hours as needed for moderate pain.   Yes [provider]  albuterol (PROVENTIL HFA;VENTOLIN HFA) 108 (90 Base) MCG/ACT inhaler Inhale 2 puffs into the lungs every 6 (six) hours as needed for wheezing or shortness of breath. 02/18/16  Yes Paduchowski, Caryn BeeKevin, MD  amLODipine (NORVASC) 5 MG tablet Take 5 mg by mouth daily.   Yes  [provider]  aspirin EC 81 MG tablet Take 81 mg by mouth daily.   Yes [provider]  cephALEXin (KEFLEX) 500 MG capsule Take 1 capsule (500 mg total) by mouth 2 (two) times daily. 11/20/16  Yes Sharman CheekStafford, Phillip, MD  clopidogrel (PLAVIX) 75 MG tablet Take 1 tablet (75 mg total) by mouth daily. 10/01/16  Yes Schnier, Latina CraverGregory G, MD  docusate sodium (COLACE) 100 MG capsule Take 100 mg by mouth 2 (two) times daily.   Yes [provider]  DULoxetine (CYMBALTA) 30 MG capsule Take 30 mg by mouth 2 (two) times daily.    Yes [provider]  febuxostat (ULORIC) 40 MG tablet Take 40 mg by mouth daily.   Yes [provider]  hypromellose (GENTEAL SEVERE) 0.3 % GEL ophthalmic ointment Place 1 application into the left eye as needed (irritation/watering).    Yes [provider]  ipratropium (ATROVENT) 0.03 % nasal spray Place 1 spray into both nostrils every 4 (four) hours as needed (congestion).    Yes [provider]  loratadine (CLARITIN) 10 MG tablet Take 10 mg by mouth every evening.    Yes [provider]  lovastatin (MEVACOR) 20 MG tablet Take 20 mg by mouth every evening.   Yes [provider]  meloxicam (MOBIC) 7.5 MG tablet Take 7.5 mg by mouth every evening.    Yes [provider]  tamsulosin (FLOMAX) 0.4 MG CAPS capsule Take 1 capsule (0.4 mg total) by mouth daily. 03/14/16  Yes McGowan, Shannon A, PA-C  feeding supplement, ENSURE ENLIVE, (ENSURE ENLIVE) LIQD Take 237 mLs by mouth 3 (three) times daily between meals. 11/18/16   Enedina Finner, MD  sulfamethoxazole-trimethoprim (BACTRIM DS) 800-160 MG tablet Take 1 tablet by mouth 2 (two) times daily. Patient not taking: Reported on 11/28/2016 11/20/16   Sharman Cheek, MD    Allergies Hydrochlorothiazide and Lisinopril  Family History  Problem Relation Age of Onset  . Kidney disease Neg Hx   . Prostate cancer Neg Hx     Social History Social History   Substance Use Topics  . Smoking status: Current Every Day Smoker    Packs/day: 1.00    Years: 50.00    Types: Cigarettes  . Smokeless tobacco: Never Used  . Alcohol use No     Comment: previous heavy drinker now 1x month    Review of Systems Constitutional: No fever/chills Eyes: No visual changes. ENT: No sore throat. No stiff neck no neck pain Cardiovascular: Denies chest pain. Respiratory: Denies shortness of breath. Gastrointestinal:   no vomiting.  No diarrhea.  No constipation. Genitourinary: Negative for dysuria. Musculoskeletal: Negative lower extremity swelling Skin: Negative for rash. Neurological: Negative for severe headaches, focal weakness or numbness. 10-point ROS otherwise negative.  ____________________________________________   PHYSICAL EXAM:  VITAL SIGNS: ED Triage Vitals  Enc Vitals Group     BP 11/28/16 1219 118/68     Pulse Rate 11/28/16 1219 85     Resp 11/28/16 1219 16     Temp 11/28/16 1219 97.9 F (36.6 C)     Temp Source 11/28/16 1219 Oral     SpO2 11/28/16 1217 95 %     Weight --      Height --      Head Circumference --      Peak Flow --      Pain Score 11/28/16 1218 5     Pain Loc --      Pain Edu? --      Excl. in GC? --     Constitutional: And is alert in no acute distress, he is oriented to self and place unsure of the date Eyes: Conjunctivae are normal. Head: Atraumatic. Nose: No congestion/rhinnorhea. Mouth/Throat: Mucous membranes are dry.  Oropharynx non-erythematous. Neck: No stridor.   Nontender with no meningismus Cardiovascular: Normal rate, regular rhythm. Grossly normal heart sounds.  Good peripheral circulation. Respiratory: Normal respiratory effort.  No retractions. Lungs CTAB. Abdominal: Soft and nontender. No distention. No guarding no rebound Back:  There is no focal tenderness or step off.  there is no midline tenderness there are no lesions noted. there is no CVA tenderness Musculoskeletal: Is tenderness  palpation in bilateral knees left worse than right, there is no erythema, there is perhaps some mild effusion on the left. It is not hot to touch. He has chronic-appearing venous ulcers in the right lower extremity that do not have any evidence of acute infection there is not cellulitic, there is no crepitus. Department are soft, calf muscles are nontender. no upper extremity tenderness. No joint effusions, no DVT signs strong distal pulses no edema Neurologic:  Normal speech and language. No gross focal neurologic deficits are appreciated.  Skin:  Skin is warm, dry and intact. No rash noted. Psychiatric: Mood and affect are normal. Speech and behavior are normal.  ____________________________________________   LABS (all labs ordered are listed, but only abnormal results are displayed)  Labs Reviewed  CBC WITH DIFFERENTIAL/PLATELET - Abnormal; Notable for the following:  Result Value   RBC 3.84 (*)    Hemoglobin 11.4 (*)    HCT 33.2 (*)    All other components within normal limits  COMPREHENSIVE METABOLIC PANEL - Abnormal; Notable for the following:    Sodium 128 (*)    Potassium 5.4 (*)    Chloride 98 (*)    CO2 19 (*)    Glucose, Bld 63 (*)    BUN 30 (*)    Creatinine, Ser 1.66 (*)    Total Protein 8.3 (*)    AST 76 (*)    GFR calc non Af Amer 37 (*)    GFR calc Af Amer 43 (*)    All other components within normal limits  GLUCOSE, CAPILLARY  LACTIC ACID, PLASMA   ____________________________________________  EKG  I personally interpreted any EKGs ordered by me or triage  ____________________________________________  RADIOLOGY  I reviewed any imaging ordered by me or triage that were performed during my shift and, if possible, patient and/or family made aware of any abnormal findings. ____________________________________________   PROCEDURES  Procedure(s) performed: None  Procedures  Critical Care performed:  None  ____________________________________________   INITIAL IMPRESSION / ASSESSMENT AND PLAN / ED COURSE  Pertinent labs & imaging results that were available during my care of the patient were reviewed by me and considered in my medical decision making (see chart for details).  Patient is here because he has chronic arthritic changes in his knee. He feels much better after pain medication. However, blood work does reveal significant dehydration. Creatinine is up from baseline, sodium is low, we are giving him IV fluids, given his age and his blood work I feel the patient should be admitted. No evidence of ischemia, acutely, his lower showed is no evidence of compartment syndrome no evidence of DVT no evidence of infected joint. Patient will be admitted the hospital for IV hydration.    ____________________________________________   FINAL CLINICAL IMPRESSION(S) / ED DIAGNOSES  Final diagnoses:  Knee pain      This chart was dictated using voice recognition software.  Despite best efforts to proofread,  errors can occur which can change meaning.      Jeanmarie Plant, MD 11/28/16 1517    Jeanmarie Plant, MD 11/28/16 [phone]

## 2016-11-28 NOTE — ED Notes (Signed)
Report to Karen, RN

## 2016-11-28 NOTE — ED Notes (Signed)
Admitting MD at bedside.

## 2016-11-28 NOTE — ED Notes (Signed)
All of belongings sent with patient. Pt still attempting to drink Kayexalate.

## 2016-11-29 LAB — CBC
HCT: 32.1 % — ABNORMAL LOW (ref 40.0–52.0)
HEMOGLOBIN: 11.2 g/dL — AB (ref 13.0–18.0)
MCH: 30.8 pg (ref 26.0–34.0)
MCHC: 35 g/dL (ref 32.0–36.0)
MCV: 88 fL (ref 80.0–100.0)
Platelets: 321 10*3/uL (ref 150–440)
RBC: 3.64 MIL/uL — AB (ref 4.40–5.90)
RDW: 13.8 % (ref 11.5–14.5)
WBC: 5 10*3/uL (ref 3.8–10.6)

## 2016-11-29 LAB — BASIC METABOLIC PANEL
ANION GAP: 9 (ref 5–15)
BUN: 27 mg/dL — ABNORMAL HIGH (ref 6–20)
CALCIUM: 8.8 mg/dL — AB (ref 8.9–10.3)
CO2: 21 mmol/L — AB (ref 22–32)
CREATININE: 1.35 mg/dL — AB (ref 0.61–1.24)
Chloride: 99 mmol/L — ABNORMAL LOW (ref 101–111)
GFR, EST AFRICAN AMERICAN: 56 mL/min — AB (ref >60)
GFR, EST NON AFRICAN AMERICAN: 48 mL/min — AB (ref >60)
Glucose, Bld: 58 mg/dL — ABNORMAL LOW (ref 65–99)
Potassium: 4.9 mmol/L (ref 3.5–5.1)
SODIUM: 129 mmol/L — AB (ref 135–145)

## 2016-11-29 MED ORDER — CLINDAMYCIN HCL 150 MG PO CAPS
600.0000 mg | ORAL_CAPSULE | Freq: Three times a day (TID) | ORAL | Status: DC
  Administered 2016-11-29 – 2016-12-04 (×16): 600 mg via ORAL
  Filled 2016-11-29 (×4): qty 4
  Filled 2016-11-29: qty 2
  Filled 2016-11-29 (×13): qty 4

## 2016-11-29 MED ORDER — AMLODIPINE BESYLATE 5 MG PO TABS
5.0000 mg | ORAL_TABLET | Freq: Every day | ORAL | Status: DC
  Administered 2016-11-29 – 2016-12-04 (×6): 5 mg via ORAL
  Filled 2016-11-29 (×6): qty 1

## 2016-11-29 NOTE — Clinical Social Work Note (Signed)
CSW received multiple consults that the patient is from a group home. CSW will assess when able.  Argentina PonderKaren Martha Marlie Kuennen, MSW, Theresia MajorsLCSWA [phone]

## 2016-11-29 NOTE — Plan of Care (Signed)
Problem: Health Behavior/Discharge Planning: Goal: Ability to manage health-related needs will improve Outcome: Not Progressing Patient has intermittent confusion.  Problem: Activity: Goal: Risk for activity intolerance will decrease Outcome: Not Progressing Patient in contracted with knee pain.

## 2016-11-29 NOTE — [place] FL2 (Signed)
[place] MEDICAID FL2 LEVEL OF CARE SCREENING TOOL     IDENTIFICATION  Patient Name: [name] MeigsLinnies Bronkema Sr. Birthdate: 03/24/1934 Sex: male Admission Date (Current Location): 11/28/2016  [place]   Facility and Address:  [facility], [address], [place], [place] [address]      Provider Number: [id number]  Attending Physician Name and Address:  Enedina FinnerPatel, Sona, MD  Relative Name and Phone Number:       Current Level of Care: Hospital [facility] Prior Approval Number:    Date Approved/Denied:   PASRR Number:    Discharge Plan: Domiciliary (Rest home)    Current Diagnoses: Patient Active Problem List   Diagnosis Date Noted  . Dehydration 11/28/2016  . Acute renal failure (ARF) (HCC) 11/28/2016  . Hyperkalemia 11/28/2016  . Altered mental status 11/28/2016  . Gait instability 11/17/2016  . Lower extremity pain, left 09/17/2016  . PAD (peripheral artery disease) (HCC) 09/17/2016  . Tobacco dependence 09/17/2016    Orientation RESPIRATION BLADDER Height & Weight     Self, Time, Situation  Normal Continent Weight: 128 lb 8 oz (58.3 kg) Height:  5\' 2"  (157.5 cm)  BEHAVIORAL SYMPTOMS/MOOD NEUROLOGICAL BOWEL NUTRITION STATUS      Continent Diet (Soft solids)  AMBULATORY STATUS COMMUNICATION OF NEEDS Skin   Extensive Assist Verbally Skin abrasions (Wounds on feet)                       Personal Care Assistance Level of Assistance  Bathing, Feeding, Dressing Bathing Assistance: Maximum assistance Feeding assistance: Limited assistance Dressing Assistance: Maximum assistance     Functional Limitations Info    Sight Info: Adequate Hearing Info: Adequate Speech Info: Adequate    SPECIAL CARE FACTORS FREQUENCY                       Contractures Contractures Info: Present    Additional Factors Info    Code Status Info: Full Allergies Info: Hydrochlorothiazide,  Lisinopril           Current Medications (11/29/2016):  This is the current hospital active medication list Current Facility-Administered Medications  Medication Dose Route Frequency Provider Last Rate Last Dose  . 0.9 %  sodium chloride infusion   Intravenous Continuous Enedina FinnerPatel, Sona, MD 100 mL/hr at 11/29/16 1022    . acetaminophen (TYLENOL) tablet 650 mg  650 mg Oral Q6H PRN Altamese DillingVachhani, Vaibhavkumar, MD   650 mg at 11/29/16 0749  . amLODipine (NORVASC) tablet 5 mg  5 mg Oral Daily Enedina FinnerPatel, Sona, MD   5 mg at 11/29/16 1027  . aspirin EC tablet 81 mg  81 mg Oral Daily Altamese DillingVachhani, Vaibhavkumar, MD   81 mg at 11/29/16 1022  . clindamycin (CLEOCIN) capsule 600 mg  600 mg Oral TID Enedina FinnerPatel, Sona, MD   600 mg at 11/29/16 1030  . clopidogrel (PLAVIX) tablet 75 mg  75 mg Oral Daily Altamese DillingVachhani, Vaibhavkumar, MD   75 mg at 11/29/16 1022  . docusate sodium (COLACE) capsule 100 mg  100 mg Oral BID Altamese DillingVachhani, Vaibhavkumar, MD   100 mg at 11/29/16 1022  . docusate sodium (COLACE) capsule 100 mg  100 mg Oral BID PRN Altamese DillingVachhani, Vaibhavkumar, MD      . DULoxetine (CYMBALTA) DR capsule 30 mg  30 mg Oral BID Altamese DillingVachhani, Vaibhavkumar, MD   30 mg at 11/29/16 1022  . feeding supplement (ENSURE ENLIVE) (ENSURE ENLIVE) liquid 237  mL  237 mL Oral TID BM Altamese Dilling, MD   237 mL at 11/29/16 1023  . heparin injection 5,000 Units  5,000 Units Subcutaneous Q8H Altamese Dilling, MD   5,000 Units at 11/28/16 2205  . ipratropium (ATROVENT) 0.03 % nasal spray 1 spray  1 spray Each Nare Q4H PRN Altamese Dilling, MD      . pravastatin (PRAVACHOL) tablet 20 mg  20 mg Oral q1800 Altamese Dilling, MD   20 mg at 11/28/16 2036  . tamsulosin (FLOMAX) capsule 0.4 mg  0.4 mg Oral Daily Altamese Dilling, MD   0.4 mg at 11/29/16 1022     Discharge Medications: Please see discharge summary for a list of discharge medications.  Relevant Imaging Results:  Relevant Lab Results:   Additional  Information SS#[id number]  Judi Cong, LCSW

## 2016-11-29 NOTE — Progress Notes (Addendum)
[facility] [place] [facility]   PATIENT NAME: [name]    MR#:  [id number]  DATE OF BIRTH:  10/21/1933  SUBJECTIVE:  Poor hisotrian. No family Came in with AMS and found to be dehydrated  REVIEW OF SYSTEMS:   Review of Systems  Unable to perform ROS: Mental status change   Tolerating Diet:yes Tolerating PT:   DRUG ALLERGIES:   Allergies  Allergen Reactions  . Hydrochlorothiazide     Documented on MAR  . Lisinopril     Documented on MAR    VITALS:  Blood pressure (!) 141/72, pulse 88, temperature 98.1 F (36.7 C), temperature source Oral, resp. rate 18, height 5\' 2"  (1.575 m), weight 58.3 kg (128 lb 8 oz), SpO2 96 %.  PHYSICAL EXAMINATION:   Physical Exam  GENERAL:  83 y.o.-year-old patient lying in the bed with no acute distress.  EYES: Pupils equal, round, reactive to light and accommodation. No scleral icterus. Extraocular muscles intact.  HEENT: Head atraumatic, normocephalic. Oropharynx and nasopharynx clear.  NECK:  Supple, no jugular venous distention. No thyroid enlargement, no tenderness.  LUNGS: Normal breath sounds bilaterally, no wheezing, rales, rhonchi. No use of accessory muscles of respiration.  CARDIOVASCULAR: S1, S2 normal. No murmurs, rubs, or gallops.  ABDOMEN: Soft, nontender, nondistended. Bowel sounds present. No organomegaly or mass.  EXTREMITIES: No cyanosis, clubbing or edema b/l.  Right ankle chronic ulcers+  NEUROLOGIC: Cranial nerves II through XII are intact. No focal Motor or sensory deficits b/l.   PSYCHIATRIC:  patient is alert and oriented x 3.  SKIN: as above  LABORATORY PANEL:  CBC  Recent Labs Lab 11/29/16 0441  WBC 5.0  HGB 11.2*  HCT 32.1*  PLT 321    Chemistries   Recent Labs Lab 11/28/16 1220 11/29/16 0441  NA 128* 129*  K 5.4* 4.9  CL 98* 99*  CO2 19* 21*  GLUCOSE 63* 58*  BUN 30* 27*  CREATININE 1.66* 1.35*  CALCIUM 9.1 8.8*  AST 76*  --   ALT 25  --   ALKPHOS 96   --   BILITOT 0.5  --    Cardiac Enzymes No results for input(s): TROPONINI in the last 168 hours. RADIOLOGY:  Dg Knee 1-2 Views Left  Result Date: 11/28/2016 CLINICAL DATA:  Left knee pain. EXAM: LEFT KNEE - 1-2 VIEW COMPARISON:  None. FINDINGS: There is severe narrowing of the medial compartment of the left knee with marginal osteophyte formation in the medial compartment. There is no appreciable fracture. No dislocation. No appreciable joint effusion. IMPRESSION: Severe arthritis of the medial compartment of the left knee. No acute abnormalities. Electronically Signed   By: Francene BoyersJames  Maxwell M.D.   On: 11/28/2016 13:56   ASSESSMENT AND PLAN:  [name]  is a 83 y.o. male with a known history of Gout, hypertension, stroke, left-sided weakness- was admitted to hospital last week with complaint of dizziness and a fall and after workup noted not a significant. He was sent back to his facility, he is under the department of social services care.  * Acute renal failure due to meds (bactrim) and poor po intake   Likely secondary to dehydration   We'll give IV fluids, and monitor renal function.  * Hyperkalemia Received Kayexalate and monitor tomorrow. -k 4.9  * chronic right foot ulcers Recently took bactrim and keflex (came to the ER) -Wound conuslt -{po clindamycin  * Hyponatremia   Likely due to dehydration, check with IV fluids.  *  Hypertension   Continue home meds.  * History of coronary artery disease    Continue aspirin and Plavix and statin.  Case discussed with Care Management/Social Worker. Management plans discussed with the patient, family and they are in agreement.  CODE STATUS: full  DVT Prophylaxis:lovenox  TOTAL TIME TAKING CARE OF THIS PATIENT:30 minutes.  >50% time spent on counselling and coordination of care  POSSIBLE D/C IN *1-2* DAYS, DEPENDING ON CLINICAL CONDITION.  Note: This dictation was prepared with Dragon dictation along with smaller  phrase technology. Any transcriptional errors that result from this process are unintentional.  Shondell Poulson M.D on 11/29/2016 at 4:41 PM  Between 7am to 6pm - Pager - [phone]  After 6pm go to [email or web address] - Social research officer, government  [facility]  Office  [phone]  CC: Primary care physician; [name], MD

## 2016-11-29 NOTE — Clinical Social Work Note (Signed)
Clinical Social Work Assessment  Patient Details  Name: [name] Duchemin Sr. MRN: [id number] Date of Birth: Nov 16, 1933  Date of referral:  11/29/16               Reason for consult:  Facility Placement                Permission sought to share information with:  Facility Industrial/product designerContact Representative Permission granted to share information::  Yes, Verbal Permission Granted  Name::        Agency::     Relationship::     Contact Information:     Housing/Transportation Living arrangements for the past 2 months:  Assisted Living Facility Source of Information:  Medical Team, Facility Patient Interpreter Needed:  None Criminal Activity/Legal Involvement Pertinent to Current Situation/Hospitalization:  No - Comment as needed Significant Relationships:  Adult Children, Community Support Lives with:  Facility Resident Do you feel safe going back to the place where you live?  Yes Need for family participation in patient care:  Yes (Comment) (Patient has a legal guardian.)  Care giving concerns:  [facility]   Social Worker assessment / plan:  The CSW attempted to visit the patient at bedside. The patient was sleeping soundly. The CSW contacted the family care home  for information. The patient can return when stable to [facility]. The patient's legal guardian is DSS worker Cleophas Dunkerngela Riley [phone]). CSW has left a voice mail for Ms. Victory DakinRiley alerting her to the admission.   The patient was recently admitted to the hospital under observation status due to foot wounds and inability to bear weight due to pain. PT had recommended SNF; however, the family care home administrator indicated that [facility]. Currently, the patient does not have an order for PT.  The CSW will continue to follow to assist with discharge once the patient is stable.  Employment status:  Retired Database administratornsurance information:  Managed Medicare PT Recommendations:  Not  assessed at this time Information / Referral to community resources:     Patient/Family's Response to care:  The facility thanked the CSW for assistance.  Patient/Family's Understanding of and Emotional Response to Diagnosis, Current Treatment, and Prognosis:  The patient's family was unavailable.  Emotional Assessment Appearance:  Appears stated age Attitude/Demeanor/Rapport:   (Patient was resting.) Affect (typically observed):   (Patient was resting.) Orientation:   (Patient was resting.) Alcohol / Substance use:  Never Used Psych involvement (Current and /or in the community):  No (Comment)  Discharge Needs  Concerns to be addressed:  Care Coordination, Discharge Planning Concerns Readmission within the last 30 days:  Yes Current discharge risk:  Chronically ill Barriers to Discharge:  Continued Medical Work up   UAL CorporationKaren M Trinady Milewski, LCSW 11/29/2016, 1:39 PM

## 2016-11-30 LAB — BASIC METABOLIC PANEL
Anion gap: 8 (ref 5–15)
BUN: 19 mg/dL (ref 6–20)
CHLORIDE: 96 mmol/L — AB (ref 101–111)
CO2: 21 mmol/L — AB (ref 22–32)
CREATININE: 1.01 mg/dL (ref 0.61–1.24)
Calcium: 8.9 mg/dL (ref 8.9–10.3)
GFR calc Af Amer: >60 mL/min (ref >60)
GFR calc non Af Amer: >60 mL/min (ref >60)
GLUCOSE: 63 mg/dL — AB (ref 65–99)
POTASSIUM: 4.9 mmol/L (ref 3.5–5.1)
SODIUM: 125 mmol/L — AB (ref 135–145)

## 2016-11-30 LAB — OSMOLALITY, URINE: Osmolality, Ur: 649 mOsm/kg (ref 300–900)

## 2016-11-30 LAB — OSMOLALITY: OSMOLALITY: 263 mosm/kg — AB (ref 275–295)

## 2016-11-30 LAB — SODIUM
SODIUM: 122 mmol/L — AB (ref 135–145)
Sodium: 122 mmol/L — ABNORMAL LOW (ref 135–145)

## 2016-11-30 LAB — SODIUM, URINE, RANDOM: SODIUM UR: 178 mmol/L

## 2016-11-30 NOTE — Consult Note (Signed)
WOC Nurse wound consult note Reason for Consult: Full thickness, partial thickness and deep tissue pressure injury at the lateral aspect of the right foot. Wound type: vascular insufficiency vs arterial insufficiency vs pressure vs vasculitic etiology Pressure Injury POA: Yes Measurement:  Distal DTPI:  2.5cm x 3.5cm with maroon/purple discoloration, no break in skin. Central ulceration, partial thickness on right lateral foot:  1.5cm x 2cm with dried serum obscuring wound bed in most places.  Pink, dry wound bed where evident. No exudate. Proximal full thickness wound on right lateral foot:  1.5cm x 2cm x 0.4cm with moist pink and yellow wound bed (strands of yellow slough), small amount of light yellow exudate. Wound bed:As described above Drainage (amount, consistency, odor) As described above Periwound: Foot is dry and without edema, warmth or erythema. Dressing procedure/placement/frequency: I will provide guidance for Nursing for topical care using twice daily NS dressings nd a pressure redistribution heel boot for the right foot. I have explained POC to patient but am unsure of his level of understanding this morning. WOC nursing team will not follow, but will remain available to this patient, the nursing and medical teams.  Please re-consult if needed. Thanks, Ladona MowLaurie Terrika Zuver, MSN, RN, GNP, Hans EdenCWOCN, CWON-AP, FAAN  Pager# [phone]

## 2016-11-30 NOTE — Consult Note (Signed)
[facility]  CONSULT NOTE    Date: 11/30/2016                  Patient Name:  [name] MeigsLinnies Szilagyi Sr.  MRN: [id number]  DOB: 1934/06/09  Age / Sex: 83 y.o., male         PCP: Leanna SatoMiles, Linda M, MD                 Service Requesting Consult: Dr. Enedina FinnerSona Patel                 Reason for Consult: Hyponatremia            History of Present Illness: [name] is a 83 y.o. black male with hypertension, CVA, gout, osteoarthritis, peripheral vascular disease, COPD/tobacco use, hyperlipidemia, coronary artery disease, BPH , who was [facility] to [facility] on 11/28/2016 for Dehydration [E86.0] Hyponatremia [E87.1] Knee pain [M25.569] AKI (acute kidney injury) (HCC) [N17.9] Chronic pain of both knees [M25.561, M25.562, G89.29]   Patient was admitted with concerns for acute renal injury from bactrium (TMP/SMX) and poor PO intake. Baseline creatinine of 1.04 from 5/17. Admitted with creatinine of 1.66 and potassium of 5.4. He was given kayexalate on admission.   He was getting antibiotics for foot ulcers. He was on bactrim and cephelexin. This has now been changed to PO clindamycin.   His serum sodium was low at 128 on admission. He was started on IV normal saline and trended to 129 and now 125. Nephrology consulted for this change in sodium level. His sodium was 141 on 5/13 and 132 on 5/17.   Patient not eating or drinking appropriately. Patient exposed to IV contrast on 5/13 for angiogram of head and neck.    Medications: Outpatient medications: Prescriptions Prior to Admission  Medication Sig Dispense Refill Last Dose  . acetaminophen (TYLENOL) 325 MG tablet Take 650 mg by mouth every 6 (six) hours as needed for moderate pain.   PRN at PRN  . albuterol (PROVENTIL HFA;VENTOLIN HFA) 108 (90 Base) MCG/ACT inhaler Inhale 2 puffs into the lungs every 6 (six) hours as needed for wheezing or shortness of breath. 1 Inhaler 2 PRN at PRN  . amLODipine (NORVASC) 5 MG tablet Take 5  mg by mouth daily.   11/28/2016 at 0800  . aspirin EC 81 MG tablet Take 81 mg by mouth daily.   11/28/2016 at 0800  . cephALEXin (KEFLEX) 500 MG capsule Take 1 capsule (500 mg total) by mouth 2 (two) times daily. 14 capsule 0 11/28/2016 at 0800  . clopidogrel (PLAVIX) 75 MG tablet Take 1 tablet (75 mg total) by mouth daily. 30 tablet 11 11/28/2016 at 0800  . docusate sodium (COLACE) 100 MG capsule Take 100 mg by mouth 2 (two) times daily.   11/28/2016 at 0800  . DULoxetine (CYMBALTA) 30 MG capsule Take 30 mg by mouth 2 (two) times daily.    11/28/2016 at 0800  . febuxostat (ULORIC) 40 MG tablet Take 40 mg by mouth daily.   11/28/2016 at 0800  . hypromellose (GENTEAL SEVERE) 0.3 % GEL ophthalmic ointment Place 1 application into the left eye as needed (irritation/watering).    PRN at PRN  . ipratropium (ATROVENT) 0.03 % nasal spray Place 1 spray into both nostrils every 4 (four) hours as needed (congestion).    PRN at PRN  . loratadine (CLARITIN) 10 MG tablet Take 10 mg by mouth every evening.    11/27/2016 at 2000  . lovastatin (MEVACOR)  20 MG tablet Take 20 mg by mouth every evening.   11/27/2016 at 2000  . meloxicam (MOBIC) 7.5 MG tablet Take 7.5 mg by mouth every evening.    11/27/2016 at 2000  . tamsulosin (FLOMAX) 0.4 MG CAPS capsule Take 1 capsule (0.4 mg total) by mouth daily. 90 capsule 3 11/28/2016 at 0800  . feeding supplement, ENSURE ENLIVE, (ENSURE ENLIVE) LIQD Take 237 mLs by mouth 3 (three) times daily between meals. 237 mL 12   . sulfamethoxazole-trimethoprim (BACTRIM DS) 800-160 MG tablet Take 1 tablet by mouth 2 (two) times daily. (Patient not taking: Reported on 11/28/2016) 14 tablet 0 Completed Course at Unknown time    Current medications: Current Facility-Administered Medications  Medication Dose Route Frequency Provider Last Rate Last Dose  . acetaminophen (TYLENOL) tablet 650 mg  650 mg Oral Q6H PRN Altamese Dilling, MD   650 mg at 11/29/16 2252  . amLODipine (NORVASC) tablet 5  mg  5 mg Oral Daily Enedina Finner, MD   5 mg at 11/30/16 0916  . aspirin EC tablet 81 mg  81 mg Oral Daily Altamese Dilling, MD   81 mg at 11/30/16 0915  . clindamycin (CLEOCIN) capsule 600 mg  600 mg Oral TID Enedina Finner, MD   600 mg at 11/30/16 0914  . clopidogrel (PLAVIX) tablet 75 mg  75 mg Oral Daily Altamese Dilling, MD   75 mg at 11/30/16 0915  . docusate sodium (COLACE) capsule 100 mg  100 mg Oral BID Altamese Dilling, MD   100 mg at 11/30/16 0915  . docusate sodium (COLACE) capsule 100 mg  100 mg Oral BID PRN Altamese Dilling, MD      . DULoxetine (CYMBALTA) DR capsule 30 mg  30 mg Oral BID Altamese Dilling, MD   30 mg at 11/30/16 0916  . feeding supplement (ENSURE ENLIVE) (ENSURE ENLIVE) liquid 237 mL  237 mL Oral TID BM Altamese Dilling, MD   237 mL at 11/30/16 0916  . heparin injection 5,000 Units  5,000 Units Subcutaneous Q8H Altamese Dilling, MD   5,000 Units at 11/30/16 [phone]  . ipratropium (ATROVENT) 0.03 % nasal spray 1 spray  1 spray Each Nare Q4H PRN Altamese Dilling, MD      . pravastatin (PRAVACHOL) tablet 20 mg  20 mg Oral q1800 Altamese Dilling, MD   20 mg at 11/29/16 1725  . tamsulosin (FLOMAX) capsule 0.4 mg  0.4 mg Oral Daily Altamese Dilling, MD   0.4 mg at 11/30/16 9604      Allergies: Allergies  Allergen Reactions  . Hydrochlorothiazide     Documented on MAR  . Lisinopril     Documented on [facility]      Past Medical History: Past Medical History:  Diagnosis Date  . Arthritis   . Gout   . Hypertension   . Stroke [facility])    left sided weakness     Past Surgical History: Past Surgical History:  Procedure Laterality Date  . INGUINAL HERNIA REPAIR Right 09/21/2015   Procedure: HERNIA REPAIR INGUINAL ADULT;  Surgeon: [name], MD;  Location: [facility] ORS;  Service: General;  Laterality: Right;  . LOWER EXTREMITY ANGIOGRAPHY Left 09/30/2016   Procedure: Lower Extremity Angiography;  Surgeon: [name], MD;  Location: [facility] INVASIVE CV LAB;  Service: Cardiovascular;  Laterality: Left;     Family History: Family History  Problem Relation Age of Onset  . Kidney disease Neg Hx   . Prostate cancer Neg Hx  Social History: Social History   Social History  . Marital status: Single    Spouse name: N/A  . Number of children: N/A  . Years of education: N/A   Occupational History  . retired    Social History Main Topics  . Smoking status: Current Every Day Smoker    Packs/day: 1.00    Years: 50.00    Types: Cigarettes  . Smokeless tobacco: Never Used  . Alcohol use No     Comment: previous heavy drinker now 1x month  . Drug use: No  . Sexual activity: Not Currently   Other Topics Concern  . Not on file   Social History Narrative  . No narrative on file     Review of Systems: Review of Systems  Unable to perform ROS: Dementia    Vital Signs: Blood pressure (!) 143/73, pulse 86, temperature 97.5 F (36.4 C), temperature source Oral, resp. rate 18, height 5\' 2"  (1.575 m), weight 58.3 kg (128 lb 8 oz), SpO2 100 %.  Weight trends: Filed Weights   11/28/16 1809  Weight: 58.3 kg (128 lb 8 oz)    Physical Exam: General: NAD, laying in bed  Head: Normocephalic, atraumatic. Moist oral mucosal membranes  Eyes: Anicteric, PERRL  Neck: Supple, trachea midline  Lungs:  Clear to auscultation  Heart: Regular rate and rhythm  Abdomen:  Soft, nontender,   Extremities:  no peripheral edema.  Neurologic: Follows commands  Skin: Left foot lesion 2 x 3 cm        Lab results: Basic Metabolic Panel:  Recent Labs Lab 11/28/16 1220 11/29/16 0441 11/30/16 0451  NA 128* 129* 125*  K 5.4* 4.9 4.9  CL 98* 99* 96*  CO2 19* 21* 21*  GLUCOSE 63* 58* 63*  BUN 30* 27* 19  CREATININE 1.66* 1.35* 1.01  CALCIUM 9.1 8.8* 8.9    Liver Function Tests:  Recent Labs Lab 11/28/16 1220  AST 76*  ALT 25  ALKPHOS 96  BILITOT 0.5  PROT 8.3*  ALBUMIN 3.6   No  results for input(s): LIPASE, AMYLASE in the last 168 hours. No results for input(s): AMMONIA in the last 168 hours.  CBC:  Recent Labs Lab 11/28/16 1220 11/29/16 0441  WBC 6.0 5.0  NEUTROABS 3.5  --   HGB 11.4* 11.2*  HCT 33.2* 32.1*  MCV 86.5 88.0  PLT 349 321    Cardiac Enzymes: No results for input(s): CKTOTAL, CKMB, CKMBINDEX, TROPONINI in the last 168 hours.  BNP: Invalid input(s): POCBNP  CBG:  Recent Labs Lab 11/28/16 1219  GLUCAP 67    Microbiology: Results for orders placed or performed during the hospital encounter of 11/28/16  MRSA PCR Screening     Status: None   Collection Time: 11/28/16  5:41 PM  Result Value Ref Range Status   MRSA by PCR NEGATIVE NEGATIVE Final    Comment:        The GeneXpert MRSA Assay (FDA approved for NASAL specimens only), is one component of a comprehensive MRSA colonization surveillance program. It is not intended to diagnose MRSA infection nor to guide or monitor treatment for MRSA infections.     Coagulation Studies: No results for input(s): LABPROT, INR in the last 72 hours.  Urinalysis:  Recent Labs  11/28/16 1604  COLORURINE YELLOW*  LABSPEC 1.016  PHURINE 5.0  GLUCOSEU NEGATIVE  HGBUR NEGATIVE  BILIRUBINUR NEGATIVE  KETONESUR NEGATIVE  PROTEINUR NEGATIVE  NITRITE NEGATIVE  LEUKOCYTESUR NEGATIVE      Imaging: Dg  Knee 1-2 Views Left  Result Date: 11/28/2016 CLINICAL DATA:  Left knee pain. EXAM: LEFT KNEE - 1-2 VIEW COMPARISON:  None. FINDINGS: There is severe narrowing of the medial compartment of the left knee with marginal osteophyte formation in the medial compartment. There is no appreciable fracture. No dislocation. No appreciable joint effusion. IMPRESSION: Severe arthritis of the medial compartment of the left knee. No acute abnormalities. Electronically Signed   By: Francene Boyers M.D.   On: 11/28/2016 13:56      Assessment & Plan: Mr. Shailen Thielen Sr. is a 83 y.o. black male with  hypertension, CVA, gout, osteoarthritis, peripheral vascular disease, COPD/tobacco use, hyperlipidemia, coronary artery disease, BPH , who was admitted to [facility] on 11/28/2016   1. Acute Renal Failure 2. Hyponatremia 3. Hyperkalemia 4. Hypertension  Plan Creatinine and potassium back at goal with IV fluids Worsening sodium level.  Patient may have some underlying SIADH with his COPD.  - Check urine studies - Fluid restriction - Discontinue IV normal saline      LOS: 2 Tadan Shill 5/27/201810:09 AM

## 2016-11-30 NOTE — Progress Notes (Signed)
[facility] [place] [facility]   PATIENT NAME: [name]    MR#:  [id number]  DATE OF BIRTH:  [birth year]  SUBJECTIVE:  Poor hisotrian. No family Came in with AMS and found to be dehydrated  REVIEW OF SYSTEMS:   Review of Systems  Unable to perform ROS: Mental status change   Tolerating Diet:yes Tolerating PT:   DRUG ALLERGIES:   Allergies  Allergen Reactions  . Hydrochlorothiazide     Documented on MAR  . Lisinopril     Documented on MAR    VITALS:  Blood pressure (!) 143/73, pulse 86, temperature 97.5 F (36.4 C), temperature source Oral, resp. rate 18, height 5\' 2"  (1.575 m), weight 58.3 kg (128 lb 8 oz), SpO2 100 %.  PHYSICAL EXAMINATION:   Physical Exam  GENERAL:  83 y.o.-year-old patient lying in the bed with no acute distress.  EYES: Pupils equal, round, reactive to light and accommodation. No scleral icterus. Extraocular muscles intact.  HEENT: Head atraumatic, normocephalic. Oropharynx and nasopharynx clear.  NECK:  Supple, no jugular venous distention. No thyroid enlargement, no tenderness.  LUNGS: Normal breath sounds bilaterally, no wheezing, rales, rhonchi. No use of accessory muscles of respiration.  CARDIOVASCULAR: S1, S2 normal. No murmurs, rubs, or gallops.  ABDOMEN: Soft, nontender, nondistended. Bowel sounds present. No organomegaly or mass.  EXTREMITIES: No cyanosis, clubbing or edema b/l.  Right ankle chronic ulcers+  NEUROLOGIC: Cranial nerves II through XII are intact. No focal Motor or sensory deficits b/l.   PSYCHIATRIC:  patient is alert and oriented x 3.  SKIN: as above  LABORATORY PANEL:  CBC  Recent Labs Lab 11/29/16 0441  WBC 5.0  HGB 11.2*  HCT 32.1*  PLT 321    Chemistries   Recent Labs Lab 11/28/16 1220  11/30/16 0451 11/30/16 1126  NA 128*  < > 125* 122*  K 5.4*  < > 4.9  --   CL 98*  < > 96*  --   CO2 19*  < > 21*  --   GLUCOSE 63*  < > 63*  --   BUN 30*  < > 19  --    CREATININE 1.66*  < > 1.01  --   CALCIUM 9.1  < > 8.9  --   AST 76*  --   --   --   ALT 25  --   --   --   ALKPHOS 96  --   --   --   BILITOT 0.5  --   --   --   < > = values in this interval not displayed. Cardiac Enzymes No results for input(s): TROPONINI in the last 168 hours. RADIOLOGY:  No results found. ASSESSMENT AND PLAN:  [name]  is a 83 y.o. male with a known history of Gout, hypertension, stroke, left-sided weakness- was admitted to hospital last week with complaint of dizziness and a fall and after workup noted not a significant. He was sent back to his facility, he is under the department of social services care.  * Acute renal failure due to meds (bactrim) and poor po intake   Likely secondary to dehydration Received IVF and creat back to normal  *Acute hyponatremia -sodium down to 125. Pt asymptomatic -?SIAD from COPD Seen by nephrology  * Hyperkalemia Received Kayexalate and monitor tomorrow. -k 4.9  * chronic right foot ulcers Recently took bactrim and keflex (came to the ER) -Wound conuslt -{po clindamycin  * Hyponatremia  Likely due to dehydration, check with IV fluids.  * Hypertension   Continue home meds.  * History of coronary artery disease    Continue aspirin and Plavix and statin.  Case discussed with Care Management/Social Worker. Management plans discussed with the patient, family and they are in agreement.  CODE STATUS: full  DVT Prophylaxis:lovenox  TOTAL TIME TAKING CARE OF THIS PATIENT:30 minutes.  >50% time spent on counselling and coordination of care  POSSIBLE D/C IN *1-2* DAYS, DEPENDING ON CLINICAL CONDITION.  Note: This dictation was prepared with Dragon dictation along with smaller phrase technology. Any transcriptional errors that result from this process are unintentional.  Bianna Haran M.D on 11/30/2016 at 1:58 PM  Between 7am to 6pm - Pager - [phone]  After 6pm go to [email or web address] - Air traffic [email or web address] [email or web address]  [facility]  [facility] [place] [facility]  Office  [phone]  CC: Primary care physician; Leanna SatoMiles, Linda M, MD

## 2016-12-01 LAB — SODIUM
SODIUM: 121 mmol/L — AB (ref 135–145)
SODIUM: 121 mmol/L — AB (ref 135–145)
Sodium: 117 mmol/L — CL (ref 135–145)

## 2016-12-01 MED ORDER — SODIUM CHLORIDE 0.9 % IV SOLN
INTRAVENOUS | Status: AC
  Administered 2016-12-01: 10:00:00 via INTRAVENOUS

## 2016-12-01 NOTE — Progress Notes (Signed)
[facility] - [place] at [facility]   PATIENT NAME: [name]    MR#:  [id number]  DATE OF BIRTH:  [birth year]  SUBJECTIVE:  Poor hisotrian. No family Came in with AMS and found to be dehydrated  REVIEW OF SYSTEMS:   Review of Systems  Unable to perform ROS: Mental status change   Tolerating Diet:yes Tolerating PT: pending  DRUG ALLERGIES:   Allergies  Allergen Reactions  . Hydrochlorothiazide     Documented on MAR  . Lisinopril     Documented on MAR    VITALS:  Blood pressure 140/72, pulse 85, temperature 97.9 F (36.6 C), temperature source Oral, resp. rate 20, height 5\' 2"  (1.575 m), weight 58.3 kg (128 lb 8 oz), SpO2 100 %.  PHYSICAL EXAMINATION:   Physical Exam  GENERAL:  83 y.o.-year-old patient lying in the bed with no acute distress.  EYES: Pupils equal, round, reactive to light and accommodation. No scleral icterus. Extraocular muscles intact.  HEENT: Head atraumatic, normocephalic. Oropharynx and nasopharynx clear.  NECK:  Supple, no jugular venous distention. No thyroid enlargement, no tenderness.  LUNGS: Normal breath sounds bilaterally, no wheezing, rales, rhonchi. No use of accessory muscles of respiration.  CARDIOVASCULAR: S1, S2 normal. No murmurs, rubs, or gallops.  ABDOMEN: Soft, nontender, nondistended. Bowel sounds present. No organomegaly or mass.  EXTREMITIES: No cyanosis, clubbing or edema b/l.  Right ankle chronic ulcers+  NEUROLOGIC: Cranial nerves II through XII are intact. No focal Motor or sensory deficits b/l.   PSYCHIATRIC:  patient is alert and oriented x 3.  SKIN: as above  LABORATORY PANEL:  CBC  Recent Labs Lab 11/29/16 0441  WBC 5.0  HGB 11.2*  HCT 32.1*  PLT 321    Chemistries   Recent Labs Lab 11/28/16 1220  11/30/16 0451  12/01/16 0940  NA 128*  < > 125*  < > 121*  K 5.4*  < > 4.9  --   --   CL 98*  < > 96*  --   --   CO2 19*  < > 21*  --   --   GLUCOSE 63*  < > 63*  --   --   BUN  30*  < > 19  --   --   CREATININE 1.66*  < > 1.01  --   --   CALCIUM 9.1  < > 8.9  --   --   AST 76*  --   --   --   --   ALT 25  --   --   --   --   ALKPHOS 96  --   --   --   --   BILITOT 0.5  --   --   --   --   < > = values in this interval not displayed. Cardiac Enzymes No results for input(s): TROPONINI in the last 168 hours. RADIOLOGY:  No results found. ASSESSMENT AND PLAN:  [name]  is a 83 y.o. male with a known history of Gout, hypertension, stroke, left-sided weakness- was admitted to hospital last week with complaint of dizziness and a fall and after workup noted not a significant. He was sent back to his facility, he is under the department of social services care.  * Acute renal failure due to meds (bactrim) and poor po intake   Likely secondary to dehydration Received IVF and creat back to normal  *Acute hyponatremia (hypovolemic hypo-osmolar) -sodium down to 125--122---121 Pt asymptomatic -poor  po intkae Seen by nephrology---IVF NS  * Hyperkalemia Received Kayexalate and monitor tomorrow. -k 4.9  * chronic right foot ulcers Recently took bactrim and keflex (came to the ER) -Wound conuslt -{po clindamycin  * Hyponatremia   Likely due to dehydration, check with IV fluids.  * Hypertension   Continue home meds.  * History of coronary artery disease    Continue aspirin and Plavix and statin.  Case discussed with Care Management/Social Worker. Management plans discussed with the patient, family and they are in agreement.  CODE STATUS: full  DVT Prophylaxis:lovenox  TOTAL TIME TAKING CARE OF THIS PATIENT:30 minutes.  >50% time spent on counselling and coordination of care  POSSIBLE D/C IN *1-2* DAYS, DEPENDING ON CLINICAL CONDITION.  Note: This dictation was prepared with Dragon dictation along with smaller phrase technology. Any transcriptional errors that result from this process are unintentional.  Ilamae Geng M.D on 12/01/2016 at 2:53  PM  Between 7am to 6pm - Pager - [phone]  After 6pm go to [email or web address] - Social research officer, [email or web address] [email or web address] [facility]  [facility] [place] [facility]  Office  [phone]  CC: Primary care physician; Leanna SatoMiles, Linda M, MD

## 2016-12-01 NOTE — Care Management Important Message (Deleted)
Important Message  Patient Details  Name: [name] MeigsLinnies Dornfeld Sr. MRN: [id number] Date of Birth: 07/05/1934   Medicare Important Message Given:  Yes    Collie Siadngela Toney Difatta, RN 12/01/2016, 12:30 PM

## 2016-12-01 NOTE — Care Management (Addendum)
Message left for legal guardian is DSS worker Cleophas Dunkerngela Riley [phone]) regarding home health services and this RNCM contact number. I have also [facility].  RNCM will continue to follow.

## 2016-12-01 NOTE — Progress Notes (Signed)
[facility]  ROUNDING NOTE   Subjective:   Na 122 Patient unable to give much history.   Objective:  Vital signs in last 24 hours:  Temp:  [97.5 F (36.4 C)-98.1 F (36.7 C)] 98.1 F (36.7 C) (05/28 0439) Pulse Rate:  [67-82] 67 (05/28 0439) Resp:  [18] 18 (05/28 0439) BP: (138-157)/(64-80) 138/64 (05/28 0439) SpO2:  [98 %-100 %] 100 % (05/28 0439)  Weight change:  Filed Weights   11/28/16 1809  Weight: 58.3 kg (128 lb 8 oz)    Intake/Output: I/O last 3 completed shifts: In: 210 [P.O.:210] Out: 750 [Urine:750]   Intake/Output this shift:  No intake/output data recorded.  Physical Exam: General: NAD,   Head: Normocephalic, atraumatic. Dry oral mucosal membranes  Eyes: Anicteric, PERRL  Neck: Supple, trachea midline  Lungs:  Clear to auscultation  Heart: Regular rate and rhythm  Abdomen:  Soft, nontender,   Extremities: no peripheral edema.  Neurologic: Nonfocal, moving all four extremities  Skin: No lesions       Basic Metabolic Panel:  Recent Labs Lab 11/28/16 1220 11/29/16 0441 11/30/16 0451 11/30/16 1126 11/30/16 1748 12/01/16 0144 12/01/16 0940  NA 128* 129* 125* 122* 122* 121* 121*  K 5.4* 4.9 4.9  --   --   --   --   CL 98* 99* 96*  --   --   --   --   CO2 19* 21* 21*  --   --   --   --   GLUCOSE 63* 58* 63*  --   --   --   --   BUN 30* 27* 19  --   --   --   --   CREATININE 1.66* 1.35* 1.01  --   --   --   --   CALCIUM 9.1 8.8* 8.9  --   --   --   --     Liver Function Tests:  Recent Labs Lab 11/28/16 1220  AST 76*  ALT 25  ALKPHOS 96  BILITOT 0.5  PROT 8.3*  ALBUMIN 3.6   No results for input(s): LIPASE, AMYLASE in the last 168 hours. No results for input(s): AMMONIA in the last 168 hours.  CBC:  Recent Labs Lab 11/28/16 1220 11/29/16 0441  WBC 6.0 5.0  NEUTROABS 3.5  --   HGB 11.4* 11.2*  HCT 33.2* 32.1*  MCV 86.5 88.0  PLT 349 321    Cardiac Enzymes: No results for input(s): CKTOTAL, CKMB,  CKMBINDEX, TROPONINI in the last 168 hours.  BNP: Invalid input(s): POCBNP  CBG:  Recent Labs Lab 11/28/16 1219  GLUCAP 67    Microbiology: Results for orders placed or performed during the hospital encounter of 11/28/16  MRSA PCR Screening     Status: None   Collection Time: 11/28/16  5:41 PM  Result Value Ref Range Status   MRSA by PCR NEGATIVE NEGATIVE Final    Comment:        The GeneXpert MRSA Assay (FDA approved for NASAL specimens only), is one component of a comprehensive MRSA colonization surveillance program. It is not intended to diagnose MRSA infection nor to guide or monitor treatment for MRSA infections.     Coagulation Studies: No results for input(s): LABPROT, INR in the last 72 hours.  Urinalysis:  Recent Labs  11/28/16 1604  COLORURINE YELLOW*  LABSPEC 1.016  PHURINE 5.0  GLUCOSEU NEGATIVE  HGBUR NEGATIVE  BILIRUBINUR NEGATIVE  KETONESUR NEGATIVE  PROTEINUR NEGATIVE  NITRITE NEGATIVE  LEUKOCYTESUR NEGATIVE      Imaging: No results found.   Medications:   . sodium chloride 50 mL/hr at 12/01/16 1028   . amLODipine  5 mg Oral Daily  . aspirin EC  81 mg Oral Daily  . clindamycin  600 mg Oral TID  . clopidogrel  75 mg Oral Daily  . docusate sodium  100 mg Oral BID  . DULoxetine  30 mg Oral BID  . feeding supplement (ENSURE ENLIVE)  237 mL Oral TID BM  . heparin  5,000 Units Subcutaneous Q8H  . pravastatin  20 mg Oral q1800  . tamsulosin  0.4 mg Oral Daily   acetaminophen, docusate sodium, ipratropium  Assessment/ Plan:  [name] is a 83 y.o.  male [name] is a 83 y.o. black male with hypertension, CVA, gout, osteoarthritis, peripheral vascular disease, COPD/tobacco use, hyperlipidemia, coronary artery disease, BPH , who was admitted to [facility] on 11/28/2016.  1. Acute Renal Failure 2. Hyponatremia 3. Hyperkalemia 4. Hypertension  Plan Creatinine and potassium back at goal with IV fluids Worsening  sodium level.  Patient exam consistent with hypovolemia. Hypo-osmolar. High urine sodium - Restart IV fluids.  - Continue serial sodium checks.    LOS: 3 [name] 5/28/201811:28 AM

## 2016-12-01 NOTE — Care Management Important Message (Signed)
Important Message  Patient Details  Name: [name] MeigsLinnies Apperson Sr. MRN: [id number] Date of Birth: [birth year]   Medicare Important Message Given:  Yes  Notification shared with legal guardian is DSS worker [name] [phone]).   [name] SiadAngela Dakotah Orrego, RN 12/01/2016, 12:32 PM

## 2016-12-01 NOTE — Progress Notes (Signed)
12/01/2016  6:29 PM  Notified by lab tech that pt's sodium reading at 117.  Notified hospitalist on call Sainani, who instructed me to notify nephrologist.  Spoke to Dr, Wynelle LinkKolluru who instructed me to stop IV fluids and administer Tolvaptan.  Will await these orders and carry out, as well as monitor and assess.  Bradly Chrisougherty, Heba Ige E, RN

## 2016-12-02 LAB — BASIC METABOLIC PANEL
Anion gap: 9 (ref 5–15)
BUN: 18 mg/dL (ref 6–20)
CHLORIDE: 89 mmol/L — AB (ref 101–111)
CO2: 25 mmol/L (ref 22–32)
CREATININE: 0.9 mg/dL (ref 0.61–1.24)
Calcium: 9.5 mg/dL (ref 8.9–10.3)
GFR calc Af Amer: >60 mL/min (ref >60)
GFR calc non Af Amer: >60 mL/min (ref >60)
Glucose, Bld: 93 mg/dL (ref 65–99)
POTASSIUM: 4.3 mmol/L (ref 3.5–5.1)
SODIUM: 123 mmol/L — AB (ref 135–145)

## 2016-12-02 LAB — CBC
HEMATOCRIT: 37.8 % — AB (ref 40.0–52.0)
Hemoglobin: 12.9 g/dL — ABNORMAL LOW (ref 13.0–18.0)
MCH: 29.9 pg (ref 26.0–34.0)
MCHC: 34.1 g/dL (ref 32.0–36.0)
MCV: 87.6 fL (ref 80.0–100.0)
PLATELETS: 425 10*3/uL (ref 150–440)
RBC: 4.32 MIL/uL — ABNORMAL LOW (ref 4.40–5.90)
RDW: 14 % (ref 11.5–14.5)
WBC: 4.4 10*3/uL (ref 3.8–10.6)

## 2016-12-02 LAB — SODIUM
SODIUM: 120 mmol/L — AB (ref 135–145)
Sodium: 123 mmol/L — ABNORMAL LOW (ref 135–145)
Sodium: 125 mmol/L — ABNORMAL LOW (ref 135–145)
Sodium: 127 mmol/L — ABNORMAL LOW (ref 135–145)

## 2016-12-02 MED ORDER — ACETAMINOPHEN 325 MG PO TABS: 650.0000 mg | ORAL_TABLET | Freq: Four times a day (QID) | ORAL | Status: DC | PRN

## 2016-12-02 MED ORDER — SODIUM CHLORIDE 0.9 % IV SOLN: INTRAVENOUS | Status: DC

## 2016-12-02 MED ORDER — SODIUM CHLORIDE 3 % IV SOLN
INTRAVENOUS | Status: DC
  Administered 2016-12-02: 35 mL/h via INTRAVENOUS
  Filled 2016-12-02 (×3): qty 500

## 2016-12-02 MED ORDER — FEBUXOSTAT 40 MG PO TABS
40.0000 mg | ORAL_TABLET | Freq: Every day | ORAL | Status: DC
  Administered 2016-12-02 – 2016-12-04 (×3): 40 mg via ORAL
  Filled 2016-12-02 (×3): qty 1

## 2016-12-02 MED ORDER — TOLVAPTAN 15 MG PO TABS
15.0000 mg | ORAL_TABLET | ORAL | Status: DC
  Administered 2016-12-02: 15 mg via ORAL
  Filled 2016-12-02: qty 1

## 2016-12-02 NOTE — Progress Notes (Signed)
[facility] - [place] at [facility]   PATIENT NAME: [name]    MR#:  [id number]  DATE OF BIRTH:  May 08, 1934  SUBJECTIVE:  Poor hisotrian. Son was in room. Came in with AMS and found to be dehydrated  pt is alert today, but some confused.  REVIEW OF SYSTEMS:   Review of Systems  Unable to perform ROS: Mental status change   Tolerating Diet:yes Tolerating PT: pending  DRUG ALLERGIES:   Allergies  Allergen Reactions  . Hydrochlorothiazide     Documented on MAR  . Lisinopril     Documented on MAR    VITALS:  Blood pressure (!) 160/92, pulse 81, temperature 97.8 F (36.6 C), temperature source Oral, resp. rate 17, height 5\' 2"  (1.575 m), weight 58.3 kg (128 lb 8 oz), SpO2 99 %.  PHYSICAL EXAMINATION:   Physical Exam  GENERAL:  83 y.o.-year-old patient lying in the bed with no acute distress.  EYES: Pupils equal, round, reactive to light and accommodation. No scleral icterus. Extraocular muscles intact.  HEENT: Head atraumatic, normocephalic. Oropharynx and nasopharynx clear.  NECK:  Supple, no jugular venous distention. No thyroid enlargement, no tenderness.  LUNGS: Normal breath sounds bilaterally, no wheezing, rales, rhonchi. No use of accessory muscles of respiration.  CARDIOVASCULAR: S1, S2 normal. No murmurs, rubs, or gallops.  ABDOMEN: Soft, nontender, nondistended. Bowel sounds present. No organomegaly or mass.  EXTREMITIES: No cyanosis, clubbing or edema b/l.  Right ankle chronic ulcers+  NEUROLOGIC: Cranial nerves II through XII are intact. No focal Motor or sensory deficits b/l.   PSYCHIATRIC:  patient is alert and oriented x 2.  SKIN: as above  LABORATORY PANEL:  CBC  Recent Labs Lab 12/02/16 0709  WBC 4.4  HGB 12.9*  HCT 37.8*  PLT 425    Chemistries   Recent Labs Lab 11/28/16 1220  12/02/16 0709  NA 128*  < > 123*  K 5.4*  < > 4.3  CL 98*  < > 89*  CO2 19*  < > 25  GLUCOSE 63*  < > 93  BUN 30*  < > 18   CREATININE 1.66*  < > 0.90  CALCIUM 9.1  < > 9.5  AST 76*  --   --   ALT 25  --   --   ALKPHOS 96  --   --   BILITOT 0.5  --   --   < > = values in this interval not displayed. Cardiac Enzymes No results for input(s): TROPONINI in the last 168 hours. RADIOLOGY:  No results found. ASSESSMENT AND PLAN:  [name]  is a 83 y.o. male with a known history of Gout, hypertension, stroke, left-sided weakness- was admitted to hospital last week with complaint of dizziness and a fall and after workup noted not a significant. He was sent back to his facility, he is under the department of social services care.  * Acute renal failure due to meds (bactrim) and poor po intake   Likely secondary to dehydration Received IVF and creat back to normal  *Acute hyponatremia (hypovolemic hypo-osmolar) -sodium down to 125--122---121- 123 Pt asymptomatic -poor po intkae Seen by nephrology---IVF NS - not improved much- Nephro suggesting 3% NS.  * Hyperkalemia Received Kayexalate and monitor  Now 4.3  * chronic right foot ulcers Recently took bactrim and keflex (came to the ER) -Wound conuslt -po clindamycin  * Hyponatremia   Likely due to dehydration, check with IV fluids.   Nephrology on case  Now on 3% saline.  * Hypertension   Continue home meds.  * History of coronary artery disease    Continue aspirin and Plavix and statin.  Case discussed with Care Management/Social Worker. Management plans discussed with the patient, family and they are in agreement.  CODE STATUS: full  DVT Prophylaxis:lovenox  TOTAL TIME TAKING CARE OF THIS PATIENT:30 minutes.  >50% time spent on counselling and coordination of care  POSSIBLE D/C IN *1-2* DAYS, DEPENDING ON CLINICAL CONDITION.  Note: This dictation was prepared with Dragon dictation along with smaller phrase technology. Any transcriptional errors that result from this process are unintentional.  Altamese Dilling M.D on  12/02/2016 at 11:56 AM  Between 7am to 6pm - Pager - [phone]  After 6pm go to [email or web address] - Social research officer, government  [facility] [place] [facility]  Office  [phone]  CC: Primary care physician; [name], MD

## 2016-12-02 NOTE — Plan of Care (Signed)
Problem: Safety: Goal: Ability to remain free from injury will improve Outcome: Progressing Talked to the patient and son about safety and staying in the  And asking for help if needed

## 2016-12-02 NOTE — Progress Notes (Signed)
[facility] [place], [place] 12/02/16  Subjective:  Patient's son is at bedside Patient is still very somnolent. Wakes up intermittently ; answers a few questions Na improved after Tolvaptan given yesterday   Objective:  Vital signs in last 24 hours:  Temp:  [97.6 F (36.4 C)-97.9 F (36.6 C)] 97.8 F (36.6 C) (05/29 4098) Pulse Rate:  [81-87] 81 (05/29 0852) Resp:  [17-20] 17 (05/29 1191) BP: (113-160)/(58-92) 160/92 (05/29 0852) SpO2:  [96 %-100 %] 99 % (05/29 4782)  Weight change:  Filed Weights   11/28/16 1809  Weight: 58.3 kg (128 lb 8 oz)    Intake/Output:    Intake/Output Summary (Last 24 hours) at 12/02/16 1201 Last data filed at 12/02/16 0900  Gross per 24 hour  Intake              493 ml  Output                0 ml  Net              493 ml     Physical Exam: General: Chronically ill appearing, laying in bed curled up  HEENT Cataracts, muddy sclera  Neck supple  Pulm/lungs Clear, normal effort  CVS/Heart No rub  Abdomen:  Soft, NT  Extremities: No edema, contracted  Neurologic: Somnolent, wakes up intermittently; answers few questions  Skin: No acute rashes      Diaper in palce    Basic Metabolic Panel:   Recent Labs Lab 11/28/16 1220 11/29/16 0441 11/30/16 0451  12/01/16 0144 12/01/16 0940 12/01/16 1739 12/02/16 0154 12/02/16 0709  NA 128* 129* 125*  < > 121* 121* 117* 120* 123*  K 5.4* 4.9 4.9  --   --   --   --   --  4.3  CL 98* 99* 96*  --   --   --   --   --  89*  CO2 19* 21* 21*  --   --   --   --   --  25  GLUCOSE 63* 58* 63*  --   --   --   --   --  93  BUN 30* 27* 19  --   --   --   --   --  18  CREATININE 1.66* 1.35* 1.01  --   --   --   --   --  0.90  CALCIUM 9.1 8.8* 8.9  --   --   --   --   --  9.5  < > = values in this interval not displayed.   CBC:  Recent Labs Lab 11/28/16 1220 11/29/16 0441 12/02/16 0709  WBC 6.0 5.0 4.4  NEUTROABS 3.5  --   --   HGB 11.4* 11.2* 12.9*  HCT 33.2* 32.1*  37.8*  MCV 86.5 88.0 87.6  PLT 349 321 425     No results found for: HEPBSAG, HEPBSAB, HEPBIGM    Microbiology:  Recent Results (from the past 240 hour(s))  MRSA PCR Screening     Status: None   Collection Time: 11/28/16  5:41 PM  Result Value Ref Range Status   MRSA by PCR NEGATIVE NEGATIVE Final    Comment:        The GeneXpert MRSA Assay (FDA approved for NASAL specimens only), is one component of a comprehensive MRSA colonization surveillance program. It is not intended to diagnose MRSA infection nor to guide or monitor treatment for MRSA infections.  Coagulation Studies: No results for input(s): LABPROT, INR in the last 72 hours.  Urinalysis: No results for input(s): COLORURINE, LABSPEC, PHURINE, GLUCOSEU, HGBUR, BILIRUBINUR, KETONESUR, PROTEINUR, UROBILINOGEN, NITRITE, LEUKOCYTESUR in the last 72 hours.  Invalid input(s): APPERANCEUR    Imaging: No results found.   Medications:   . sodium chloride    . sodium chloride (hypertonic)     . amLODipine  5 mg Oral Daily  . aspirin EC  81 mg Oral Daily  . clindamycin  600 mg Oral TID  . clopidogrel  75 mg Oral Daily  . docusate sodium  100 mg Oral BID  . DULoxetine  30 mg Oral BID  . feeding supplement (ENSURE ENLIVE)  237 mL Oral TID BM  . heparin  5,000 Units Subcutaneous Q8H  . pravastatin  20 mg Oral q1800  . tamsulosin  0.4 mg Oral Daily   acetaminophen, docusate sodium, ipratropium  Assessment/ Plan:  83 y.o. African [place] male with hypertension, CVA, gout, osteoarthritis, peripheral vascular disease, COPD/tobacco use, hyperlipidemia, coronary artery disease, BPH , who was [facility] to [facility] on 11/28/2016   1. Acute Renal Failure. Admit Cr 1.66, now improved  2. Hyponatremia. Na was 141 on 11/16/2016 3. Hyperkalemia 4. Hypertension  Plan Creatinine and potassium back at goal with IV fluids but Na worsened Patient may have some underlying SIADH with his COPD.  - Check urine studies; Urine  Osm 649, Urine Na 178 - Fluid restriction - Discontinue IV normal saline - Start 3% saline at low dose. Monitor Na level frequently - consider holding cymbalta   LOS: 4 [facility],Analysia Dungee 5/29/201812:01 PM  [facility] [place], [place] [phone]

## 2016-12-02 NOTE — Clinical Social Work Note (Signed)
DSS guardian: Cleophas Dunkerngela Riley contacted CSW this morning for an update. CSW provided update. Ms. [name] DakinRiley is requesting a PT consult and states that patient's confusion is new. CSW has relayed this to the physician. York SpanielMonica Idella Lamontagne MSW,LCSW [phone]

## 2016-12-02 NOTE — Evaluation (Signed)
Physical Therapy Evaluation Patient Details Name: [name] Bouza Sr. MRN: [id number] DOB: 07-06-1934 Today's Date: 12/02/2016   History of Present Illness  Pt is a 83 y/o M who presents due to confusion.  Workup in the ER revealed pt dehydrated with acute renal failure and elevated potassium.  Pt recently admitted to hospital 11/17/16 after falling out of WC and c/o intermittent BUE ataxia and weakness, MRI during this stay negative for acute infarct but did show remote lacunar infarct R basal ganglia.  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  Of note, imaging also showing intertrochanteric L femur fx (per imaging appears remote). PMH includes h/o stroke with L sided weakness, PAD, and htn. Pt also with wounds R malleolus and lateral foot with dressing in place.      Clinical Impression  Pt admitted with above diagnosis. Pt currently with functional limitations due to the deficits listed below (see PT Problem List). [name] presents lethargic and although he is agreeable to therapy, he provides limited effort to participate.  Pt reports that he is Ind with bed mobility and bed<>WC transfers at baseline and that he has been nonambulatory for >10 years.  He currently requires max assist for bed mobility and min assist>max assist to maintain upright seated EOB, limited by fatigue. Pt will benefit from skilled PT to increase their independence and safety with mobility to allow discharge to the venue listed below.      Follow Up Recommendations SNF    Equipment Recommendations  None recommended by PT    Recommendations for Other Services       Precautions / Restrictions Precautions Precautions: Fall Restrictions Weight Bearing Restrictions: Yes LLE Weight Bearing: Weight bearing as tolerated (Verbal per MD Allena Katz on 11/17/16 pt WBAT LLE.   )      Mobility  Bed Mobility Overal bed mobility: Needs Assistance Bed Mobility: Supine to Sit;Sit to Supine     Supine to sit: Max assist Sit to supine: Max assist   General bed mobility comments: Pt requires max assist to elevate trunk and to manage BLEs as they remain in Bil hip flexion and knee flexion with bed mobility.  Pt attempt to assist minimally.  Transfers                 General transfer comment: deferred as pt requires max assist for bed mobility and was quick to fatigue sitting EOB  Ambulation/Gait             General Gait Details: Not appropriate (pt non ambulatory baseline)  Stairs            Wheelchair Mobility    Modified Rankin (Stroke Patients Only)       Balance Overall balance assessment: Needs assistance Sitting-balance support: Feet supported;Bilateral upper extremity supported Sitting balance-Leahy Scale: Poor                                       Pertinent Vitals/Pain Pain Assessment: Faces Faces Pain Scale: Hurts little more Pain Location: Bil hamstrings with attempt to extend at knees Pain Descriptors / Indicators: Tightness;Cramping Pain Intervention(s): Limited activity within patient's tolerance;Monitored during session    Home Living Family/patient expects to be discharged to:: Assisted living  Home Equipment: Wheelchair - power Additional Comments: Pt is from [facility] where he has resided for the past 6-7 months.    Prior Function Level of Independence: Needs assistance   Gait / Transfers Assistance Needed: Pt reports he was Independent with bed mobility and bed<>power WC transfers PTA.  Pt has been nonambulatory for >10 years.  Pt denies the use of a lift at [facility].  ADL's / Homemaking Assistance Needed: Pt requires assist for bathing, dressing.  Meals are provided at facility.        Hand Dominance   Dominant Hand: Right    Extremity/Trunk Assessment    Upper Extremity Assessment Upper Extremity Assessment: Generalized weakness;LUE deficits/detail (Pt refuses to allow this PT to formally assess BUEs) LUE Deficits / Details: Pt allows this PT to move LUE through PROM.  Able to achieve ~160 deg L shoulder flexion and lacking ~20 deg L elbow extension with PROM.    Lower Extremity Assessment Lower Extremity Assessment: RLE deficits/detail;LLE deficits/detail RLE Deficits / Details: Pt does not actively participate but allows this PT to move RLE through range of ~70-120 deg R knee flexion with pt reporting tightness and cramping in R hamstring with extension.  Feels like contracture; however, most recent PT note reports pt had RLE ROM WFL.  R hip remains in ~90 deg flexion and again feels like a contracture with attempt to move into extension.  Wound medial and lateral malleoli per pt with dressing in place.  Pt's son reports this wound is from pt bumping against WC. LLE Deficits / Details: LLE hip and knee in flexed position at rest and in sitting.  Pt unable to tolerate this PT moving pt past 90 deg hip flexion knee flexion due to cramping and tightness in L hamstring.      Cervical / Trunk Assessment Cervical / Trunk Assessment: Kyphotic  Communication   Communication: No difficulties  Cognition Arousal/Alertness: Lethargic (Very slow to open eyes and engage) Behavior During Therapy: Flat affect Overall Cognitive Status:  (Not oriented to time.  Poor historian)                                        General Comments General comments (skin integrity, edema, etc.): Pt's son present during session    Exercises Other Exercises Other Exercises: Pt sat EOB ~5 minutes requiring min assist>max assist to maintain upright despite BUE support.  He fatigues quickly and requests to return to supine despite encouragement to participate.     Assessment/Plan    PT Assessment Patient needs continued PT services  PT Problem List  Decreased strength;Decreased range of motion;Decreased activity tolerance;Decreased balance;Decreased mobility;Decreased coordination;Decreased cognition;Decreased knowledge of use of DME;Decreased safety awareness;Pain       PT Treatment Interventions DME instruction;Functional mobility training;Therapeutic activities;Therapeutic exercise;Balance training;Neuromuscular re-education;Cognitive remediation;Patient/family education;Wheelchair mobility training;Manual techniques;Modalities    PT Goals (Current goals can be found in the Care Plan section)  Acute Rehab PT Goals Patient Stated Goal: none stated PT Goal Formulation: With patient Time For Goal Achievement: 12/16/16 Potential to Achieve Goals: Poor    Frequency Min 2X/week   Barriers to discharge        Co-evaluation               AM-PAC PT "6 Clicks" Daily Activity  Outcome Measure Difficulty turning over in bed (including adjusting bedclothes, sheets and blankets)?: Total Difficulty moving  from lying on back to sitting on the side of the bed? : Total Difficulty sitting down on and standing up from a chair with arms (e.g., wheelchair, bedside commode, etc,.)?: Total Help needed moving to and from a bed to chair (including a wheelchair)?: Total Help needed walking in hospital room?: Total Help needed climbing 3-5 steps with a railing? : Total 6 Click Score: 6    End of Session   Activity Tolerance: Patient limited by fatigue;Patient limited by lethargy Patient left: in bed;with call bell/phone within reach;with bed alarm set;with family/visitor present Nurse Communication: Mobility status;Need for lift equipment PT Visit Diagnosis: Muscle weakness (generalized) (M62.81);History of falling (Z91.81)    Time: [id number] PT Time Calculation (min) (ACUTE ONLY): 24 min   Charges:   PT Evaluation $PT Eval Low Complexity: 1 Procedure PT Treatments $Therapeutic Activity: 8-22 mins   PT G Codes:        Encarnacion ChuAshley  Abashian PT, DPT 12/02/2016, 2:42 PM

## 2016-12-02 NOTE — Progress Notes (Addendum)
MEDICATION RELATED CONSULT NOTE  Pharmacy Consult for 3% hypertonic saline Indication: hyponatremia   Assessment: Pharmacy consulted per protocol to monitor sodium levels in this 82 yoM with AMS and dehydration. Nephrology following.  Goal of Therapy:  Per nephrologist, no more than 12 mEq increase in 24 hour period  Date Time Na 5/28 1739 117 5/29 0154 120  0709 123  1149 123  1527 125  1916 127  0330 134  Plan:  Spoke with nephrologist and will continue hypertonic saline at current rate. He is fine with < 12 mEq/L increase in 24 hour period. Will continue to monitor q 4 hours.  5/31 03:30 Na 134, ~11 point increase from start of infusion. Per RN, hypertonic saline infusion has been stopped. Recheck at next interval.  Allergies  Allergen Reactions  . Hydrochlorothiazide     Documented on MAR  . Lisinopril     [facility]    Patient Measurements: Height: 5\' 2"  (157.5 cm) Weight: 128 lb 8 oz (58.3 kg) IBW/kg (Calculated) : 54.6  Vital Signs: Temp: 98 F (36.7 C) (05/29 1220) Temp Source: Oral (05/29 1220) BP: 127/68 (05/29 1220) Pulse Rate: 82 (05/29 1220) Intake/Output from previous day: 05/28 0701 - 05/29 0700 In: 373 [P.O.:120; I.V.:253] Out: 0  Intake/Output from this shift: Total I/O In: 360 [P.O.:360] Out: -   Labs:  Recent Labs  11/30/16 0451 12/02/16 0709  WBC  --  4.4  HGB  --  12.9*  HCT  --  37.8*  PLT  --  425  CREATININE 1.01 0.90   Estimated Creatinine Clearance: 50.6 mL/min (by C-G formula based on SCr of 0.9 mg/dL).   Microbiology: Recent Results (from the past 720 hour(s))  MRSA PCR Screening     Status: None   Collection Time: 11/17/16  5:00 AM  Result Value Ref Range Status   MRSA by PCR NEGATIVE NEGATIVE Final    Comment:        The GeneXpert MRSA Assay (FDA approved for NASAL specimens only), is one component of a comprehensive MRSA colonization surveillance program. It is not intended to diagnose  MRSA infection nor to guide or monitor treatment for MRSA infections.   MRSA PCR Screening     Status: None   Collection Time: 11/28/16  5:41 PM  Result Value Ref Range Status   MRSA by PCR NEGATIVE NEGATIVE Final    Comment:        The GeneXpert MRSA Assay (FDA approved for NASAL specimens only), is one component of a comprehensive MRSA colonization surveillance program. It is not intended to diagnose MRSA infection nor to guide or monitor treatment for MRSA infections.     Medical History: Past Medical History:  Diagnosis Date  . Arthritis   . Gout   . Hypertension   . Stroke [facility](HCC)    left sided weakness    Medications:  Scheduled:  . amLODipine  5 mg Oral Daily  . aspirin EC  81 mg Oral Daily  . clindamycin  600 mg Oral TID  . clopidogrel  75 mg Oral Daily  . docusate sodium  100 mg Oral BID  . febuxostat  40 mg Oral Daily  . feeding supplement (ENSURE ENLIVE)  237 mL Oral TID BM  . heparin  5,000 Units Subcutaneous Q8H  . pravastatin  20 mg Oral q1800  . tamsulosin  0.4 mg Oral Daily   Infusions:  . sodium chloride (hypertonic) 35 mL/hr (12/02/16 1237)    Fulton ReekMatt Samanthan Dugo,  PharmD, BCPS  12/03/16 5:21 AM

## 2016-12-03 LAB — BASIC METABOLIC PANEL
ANION GAP: 7 (ref 5–15)
BUN: 26 mg/dL — ABNORMAL HIGH (ref 6–20)
CHLORIDE: 103 mmol/L (ref 101–111)
CO2: 24 mmol/L (ref 22–32)
Calcium: 9.4 mg/dL (ref 8.9–10.3)
Creatinine, Ser: 0.96 mg/dL (ref 0.61–1.24)
GFR calc Af Amer: >60 mL/min (ref >60)
Glucose, Bld: 106 mg/dL — ABNORMAL HIGH (ref 65–99)
POTASSIUM: 4.7 mmol/L (ref 3.5–5.1)
Sodium: 134 mmol/L — ABNORMAL LOW (ref 135–145)

## 2016-12-03 LAB — SODIUM
SODIUM: 133 mmol/L — AB (ref 135–145)
Sodium: 133 mmol/L — ABNORMAL LOW (ref 135–145)

## 2016-12-03 NOTE — Progress Notes (Signed)
[facility] - [place] [facility]   PATIENT NAME: [name] MeigsLinnies [name]    MR#:  [id number]  DATE OF BIRTH:  1933/07/21  SUBJECTIVE:  Poor hisotrian. Son was in room. Came in with AMS and found to be dehydrated  pt is alert today, but some confused.   Sodium level improved.  REVIEW OF SYSTEMS:   Review of Systems  Unable to perform ROS: Mental status change   Tolerating Diet:yes Tolerating PT: pending  DRUG ALLERGIES:   Allergies  Allergen Reactions  . Hydrochlorothiazide     Documented on MAR  . Lisinopril     Documented on MAR    VITALS:  Blood pressure (!) 91/58, pulse 91, temperature 98.1 F (36.7 C), temperature source Oral, resp. rate 20, height 5\' 2"  (1.575 m), weight 58.3 kg (128 lb 8 oz), SpO2 98 %.  PHYSICAL EXAMINATION:   Physical Exam  GENERAL:  83 y.o.-year-old patient lying in the bed with no acute distress.  EYES: Pupils equal, round, reactive to light and accommodation. No scleral icterus. Extraocular muscles intact.  HEENT: Head atraumatic, normocephalic. Oropharynx and nasopharynx clear.  NECK:  Supple, no jugular venous distention. No thyroid enlargement, no tenderness.  LUNGS: Normal breath sounds bilaterally, no wheezing, rales, rhonchi. No use of accessory muscles of respiration.  CARDIOVASCULAR: S1, S2 normal. No murmurs, rubs, or gallops.  ABDOMEN: Soft, nontender, nondistended. Bowel sounds present. No organomegaly or mass. Condom catheter in place. EXTREMITIES: No cyanosis, clubbing or edema b/l.  Right ankle chronic ulcers+  NEUROLOGIC: Cranial nerves II through XII are intact. No focal Motor or sensory deficits b/l.   PSYCHIATRIC:  patient is alert and oriented x 2.  SKIN: as above  LABORATORY PANEL:  CBC  Recent Labs Lab 12/02/16 0709  WBC 4.4  HGB 12.9*  HCT 37.8*  PLT 425    Chemistries   Recent Labs Lab 11/28/16 1220  12/03/16 0325  12/03/16 1119  NA 128*  < > 134*  < > 133*  K 5.4*  < > 4.7  --    --   CL 98*  < > 103  --   --   CO2 19*  < > 24  --   --   GLUCOSE 63*  < > 106*  --   --   BUN 30*  < > 26*  --   --   CREATININE 1.66*  < > 0.96  --   --   CALCIUM 9.1  < > 9.4  --   --   AST 76*  --   --   --   --   ALT 25  --   --   --   --   ALKPHOS 96  --   --   --   --   BILITOT 0.5  --   --   --   --   < > = values in this interval not displayed. Cardiac Enzymes No results for input(s): TROPONINI in the last 168 hours. RADIOLOGY:  No results found. ASSESSMENT AND PLAN:  [name]  is a 83 y.o. male with a known history of Gout, hypertension, stroke, left-sided weakness- was admitted to hospital last week with complaint of dizziness and a fall and after workup noted not a significant. He was sent back to his facility, he is under the department of social services care.  * Acute renal failure due to meds (bactrim) and poor po intake   Likely secondary to dehydration  Received IVF and creat back to normal  *Acute hyponatremia (hypovolemic hypo-osmolar) -sodium down to 125--122---121- 123 Pt asymptomatic -poor po intkae  Seen by nephrology---IVF NS - not improved much- Nephro suggesting 3% NS. - Improved with 3% saline. Stopped IV now. - monitor today, if sodium stable tomorrow, may d/c.  * Hyperkalemia Received Kayexalate and monitor  Now 4.3  * chronic right foot ulcers Recently took bactrim and keflex (came to the ER) -Wound conuslt -po clindamycin  * Hyponatremia   Likely due to dehydration, check with IV fluids.   Nephrology on case   Now on 3% saline.  * Hypertension   Continue home meds.  * History of coronary artery disease    Continue aspirin and Plavix and statin.  Case discussed with Care Management/Social Worker. Management plans discussed with the patient, family and they are in agreement.  CODE STATUS: full  DVT Prophylaxis:lovenox  TOTAL TIME TAKING CARE OF THIS PATIENT:30 minutes.  >50% time spent on counselling and coordination  of care  POSSIBLE D/C IN *1-2* DAYS, DEPENDING ON CLINICAL CONDITION.  Note: This dictation was prepared with Dragon dictation along with smaller phrase technology. Any transcriptional errors that result from this process are unintentional.  Altamese Dilling M.D on 12/03/2016 at 2:27 PM  Between 7am to 6pm - Pager - [phone]  After 6pm go to [email or web address] - Social research officer, government  [facility] [place] [facility]  Office  [phone]  CC: Primary care physician; [name], MD

## 2016-12-03 NOTE — Clinical Social Work Note (Signed)
[facility] at [facility] has begun prior authorization for [employer] and will let CSW know once obtained. York SpanielMonica Daking Westervelt MSW,LCSW [phone]

## 2016-12-03 NOTE — Progress Notes (Signed)
[facility], [place] 12/03/16  Subjective:    Patient is much more alert today States he has been able to eat normally  Objective:  Vital signs in last 24 hours:  Temp:  [97.4 F (36.3 C)-98.1 F (36.7 C)] 98.1 F (36.7 C) (05/30 1210) Pulse Rate:  [91-96] 91 (05/30 1210) Resp:  [20] 20 (05/30 1210) BP: (91-132)/(57-73) 91/58 (05/30 1210) SpO2:  [93 %-98 %] 98 % (05/30 1210)  Weight change:  Filed Weights   11/28/16 1809  Weight: 58.3 kg (128 lb 8 oz)    Intake/Output:    Intake/Output Summary (Last 24 hours) at 12/03/16 1515 Last data filed at 12/03/16 1418  Gross per 24 hour  Intake             1207 ml  Output              650 ml  Net              557 ml     Physical Exam: General: Chronically ill appearing, laying in bed curled up  HEENT Cataracts, muddy sclera  Neck supple  Pulm/lungs Clear, normal effort  CVS/Heart No rub  Abdomen:  Soft, NT  Extremities: No edema, contracted  Neurologic: Alert, able to answer Questions  Skin: No acute rashes      Diaper in palce    Basic Metabolic Panel:   Recent Labs Lab 11/28/16 1220 11/29/16 0441 11/30/16 0451  12/02/16 0709  12/02/16 1527 12/02/16 1916 12/03/16 0325 12/03/16 0742 12/03/16 1119  NA 128* 129* 125*  < > 123*  < > 125* 127* 134* 133* 133*  K 5.4* 4.9 4.9  --  4.3  --   --   --  4.7  --   --   CL 98* 99* 96*  --  89*  --   --   --  103  --   --   CO2 19* 21* 21*  --  25  --   --   --  24  --   --   GLUCOSE 63* 58* 63*  --  93  --   --   --  106*  --   --   BUN 30* 27* 19  --  18  --   --   --  26*  --   --   CREATININE 1.66* 1.35* 1.01  --  0.90  --   --   --  0.96  --   --   CALCIUM 9.1 8.8* 8.9  --  9.5  --   --   --  9.4  --   --   < > = values in this interval not displayed.   CBC:  Recent Labs Lab 11/28/16 1220 11/29/16 0441 12/02/16 0709  WBC 6.0 5.0 4.4  NEUTROABS 3.5  --   --   HGB 11.4* 11.2* 12.9*  HCT 33.2* 32.1* 37.8*  MCV 86.5 88.0  87.6  PLT 349 321 425     No results found for: HEPBSAG, HEPBSAB, HEPBIGM    Microbiology:  Recent Results (from the past 240 hour(s))  MRSA PCR Screening     Status: None   Collection Time: 11/28/16  5:41 PM  Result Value Ref Range Status   MRSA by PCR NEGATIVE NEGATIVE Final    Comment:        The GeneXpert MRSA Assay (FDA approved for NASAL specimens only), is one component of a comprehensive MRSA colonization surveillance  program. It is not intended to diagnose MRSA infection nor to guide or monitor treatment for MRSA infections.     Coagulation Studies: No results for input(s): LABPROT, INR in the last 72 hours.  Urinalysis: No results for input(s): COLORURINE, LABSPEC, PHURINE, GLUCOSEU, HGBUR, BILIRUBINUR, KETONESUR, PROTEINUR, UROBILINOGEN, NITRITE, LEUKOCYTESUR in the last 72 hours.  Invalid input(s): APPERANCEUR    Imaging: No results found.   Medications:    . amLODipine  5 mg Oral Daily  . aspirin EC  81 mg Oral Daily  . clindamycin  600 mg Oral TID  . clopidogrel  75 mg Oral Daily  . docusate sodium  100 mg Oral BID  . febuxostat  40 mg Oral Daily  . feeding supplement (ENSURE ENLIVE)  237 mL Oral TID BM  . heparin  5,000 Units Subcutaneous Q8H  . pravastatin  20 mg Oral q1800  . tamsulosin  0.4 mg Oral Daily   acetaminophen, docusate sodium, ipratropium  Assessment/ Plan:  83 y.o. African [place] male with hypertension, CVA, gout, osteoarthritis, peripheral vascular disease, COPD/tobacco use, hyperlipidemia, coronary artery disease, BPH , who was [facility] on 11/28/2016   1. Acute Renal Failure. Admit Cr 1.66, now improved  2. Hyponatremia. Na was 141 on 11/16/2016 3. Hyperkalemia 4. Hypertension  Plan   Patient may have some underlying SIADH with his COPD.  -  Na improved to 134 this morning. 3% saline discontinued - fluid restriction to 1 L per day Continue to monitor Will follow    LOS: 5 [facility],Dondrea Clendenin 5/30/20183:15  PM  [facility] [place], [place] [phone]

## 2016-12-03 NOTE — Clinical Social Work Note (Signed)
CSW spoke with DSS Guardian, [name], this morning and she is in agreement with attempting STR for patient. Bed search conducted and she has chosen [facility]. CSW has made [facility] aware. York SpanielMonica Louise [name] MSW,LCSW [phone]

## 2016-12-03 NOTE — [place] FL2 (Signed)
[place] MEDICAID FL2 LEVEL OF CARE SCREENING TOOL     IDENTIFICATION  Patient Name: [name] Ayyad Sr. Birthdate: 09/02/1933 Sex: male Admission Date (Current Location): 11/28/2016  [place] and [place] Number:  Chiropodist and Address:  [facility], [address], [place], [place] [address]      Provider Number: [id number]  Attending Physician Name and Address:  Altamese Dilling, *  Relative Name and Phone Number:       Current Level of Care: Hospital Recommended Level of Care: Skilled Nursing Facility Prior Approval Number:    Date Approved/Denied:   PASRR Number:    Discharge Plan: SNF    Current Diagnoses: Patient Active Problem List   Diagnosis Date Noted  . Dehydration 11/28/2016  . Acute renal failure (ARF) (HCC) 11/28/2016  . Hyperkalemia 11/28/2016  . Altered mental status 11/28/2016  . Gait instability 11/17/2016  . Lower extremity pain, left 09/17/2016  . PAD (peripheral artery disease) (HCC) 09/17/2016  . Tobacco dependence 09/17/2016    Orientation RESPIRATION BLADDER Height & Weight     Self, Place  Normal Continent Weight: 128 lb 8 oz (58.3 kg) Height:  5\' 2"  (157.5 cm)  BEHAVIORAL SYMPTOMS/MOOD NEUROLOGICAL BOWEL NUTRITION STATUS   (none)  (none) Continent Diet (Soft solids)  AMBULATORY STATUS COMMUNICATION OF NEEDS Skin   Total Care Verbally Skin abrasions (Wounds on feet)                       Personal Care Assistance Level of Assistance  Bathing, Feeding, Dressing Bathing Assistance: Maximum assistance Feeding assistance: Limited assistance Dressing Assistance: Maximum assistance     Functional Limitations Info   (none) Sight Info: Adequate Hearing Info: Adequate Speech Info: Adequate    SPECIAL CARE FACTORS FREQUENCY  PT (By licensed PT)                    Contractures Contractures Info: Present    Additional Factors Info    Code Status Info: Full Allergies Info:  Hydrochlorothiazide, Lisinopril           Current Medications (12/03/2016):  This is the current hospital active medication list Current Facility-Administered Medications  Medication Dose Route Frequency Provider Last Rate Last Dose  . acetaminophen (TYLENOL) tablet 650 mg  650 mg Oral Q6H PRN Altamese Dilling, MD      . amLODipine (NORVASC) tablet 5 mg  5 mg Oral Daily Enedina Finner, MD   5 mg at 12/03/16 0947  . aspirin EC tablet 81 mg  81 mg Oral Daily Altamese Dilling, MD   81 mg at 12/03/16 0949  . clindamycin (CLEOCIN) capsule 600 mg  600 mg Oral TID Enedina Finner, MD   600 mg at 12/03/16 0950  . clopidogrel (PLAVIX) tablet 75 mg  75 mg Oral Daily Altamese Dilling, MD   75 mg at 12/03/16 0947  . docusate sodium (COLACE) capsule 100 mg  100 mg Oral BID Altamese Dilling, MD   100 mg at 12/03/16 0948  . docusate sodium (COLACE) capsule 100 mg  100 mg Oral BID PRN Altamese Dilling, MD      . febuxostat (ULORIC) tablet 40 mg  40 mg Oral Daily Altamese Dilling, MD   40 mg at 12/03/16 0948  . feeding supplement (ENSURE ENLIVE) (ENSURE ENLIVE) liquid 237 mL  237 mL Oral TID BM Altamese Dilling, MD   237 mL at 12/02/16 1946  . heparin injection 5,000 Units  5,000 Units Subcutaneous  Q8H Altamese DillingVachhani, Shanikka Wonders, MD   5,000 Units at 12/02/16 1607  . ipratropium (ATROVENT) 0.03 % nasal spray 1 spray  1 spray Each Nare Q4H PRN Altamese DillingVachhani, Saron Tweed, MD      . pravastatin (PRAVACHOL) tablet 20 mg  20 mg Oral q1800 Altamese DillingVachhani, Elan Brainerd, MD   20 mg at 12/02/16 1902  . tamsulosin (FLOMAX) capsule 0.4 mg  0.4 mg Oral Daily Altamese DillingVachhani, Bexleigh Theriault, MD   0.4 mg at 12/03/16 [id number]     Discharge Medications: Please see discharge summary for a list of discharge medications.  Relevant Imaging Results:  Relevant Lab Results:   Additional Information [id number]  York SpanielMonica Marra, LCSW

## 2016-12-04 LAB — BASIC METABOLIC PANEL
Anion gap: 7 (ref 5–15)
BUN: 33 mg/dL — ABNORMAL HIGH (ref 6–20)
CO2: 26 mmol/L (ref 22–32)
Calcium: 9.5 mg/dL (ref 8.9–10.3)
Chloride: 101 mmol/L (ref 101–111)
Creatinine, Ser: 0.95 mg/dL (ref 0.61–1.24)
GFR calc Af Amer: >60 mL/min (ref >60)
GFR calc non Af Amer: >60 mL/min (ref >60)
Glucose, Bld: 112 mg/dL — ABNORMAL HIGH (ref 65–99)
Potassium: 4.8 mmol/L (ref 3.5–5.1)
Sodium: 134 mmol/L — ABNORMAL LOW (ref 135–145)

## 2016-12-04 NOTE — Clinical Social Work Note (Signed)
Patient's insurance has taken patient's information to their medical director for review. It is not looking encouraging that medicare [name] will approve for STR. In the event that they do not approve, [name] is able to take patient under his medicaid. CSW had spoken to DSS guardian this morning and she was aware of discharge and wanted EMS to transport. CSW has left her another message to inform her that insurance may not approve and that [name] would then take patient under medicaid. York SpanielMonica Tydus Sanmiguel MSW,LCSW [phone]

## 2016-12-04 NOTE — Progress Notes (Signed)
EMS here to pick up patient, patient is alert to self no acute distress noted.

## 2016-12-04 NOTE — Progress Notes (Signed)
Patient being discharged to [facility]. Called [facility] and spoke to [facility] Strader LPN and gave report. Patient alert to self, dressing changed to right ankle, wound healing well. Patient prepared for transport. EMS called for non emergent transport to [facility].

## 2016-12-04 NOTE — Progress Notes (Signed)
[facility] [place], [place] 12/04/16  Subjective:    Patient is much more alert today States he has been able to eat normally No SOB Wants to go outside and smoke  Objective:  Vital signs in last 24 hours:  Temp:  [97.8 F (36.6 C)-99 F (37.2 C)] 98.1 F (36.7 C) (05/31 1252) Pulse Rate:  [90-97] 92 (05/31 1252) Resp:  [18-20] 20 (05/31 1252) BP: (98-151)/(53-82) 98/59 (05/31 1252) SpO2:  [95 %-100 %] 95 % (05/31 1252)  Weight change:  Filed Weights   11/28/16 1809  Weight: 58.3 kg (128 lb 8 oz)    Intake/Output:    Intake/Output Summary (Last 24 hours) at 12/04/16 1344 Last data filed at 12/04/16 0900  Gross per 24 hour  Intake              480 ml  Output              350 ml  Net              130 ml     Physical Exam: General: Chronically ill appearing, laying in bed curled up  HEENT Cataracts, muddy sclera  Neck supple  Pulm/lungs Clear, normal effort  CVS/Heart No rub  Abdomen:  Soft, NT  Extremities: No edema, contracted  Neurologic: Alert, able to answer Questions  Skin: No acute rashes      Diaper in palce    Basic Metabolic Panel:   Recent Labs Lab 11/29/16 0441 11/30/16 0451  12/02/16 0709  12/02/16 1916 12/03/16 0325 12/03/16 0742 12/03/16 1119 12/04/16 0427  NA 129* 125*  < > 123*  < > 127* 134* 133* 133* 134*  K 4.9 4.9  --  4.3  --   --  4.7  --   --  4.8  CL 99* 96*  --  89*  --   --  103  --   --  101  CO2 21* 21*  --  25  --   --  24  --   --  26  GLUCOSE 58* 63*  --  93  --   --  106*  --   --  112*  BUN 27* 19  --  18  --   --  26*  --   --  33*  CREATININE 1.35* 1.01  --  0.90  --   --  0.96  --   --  0.95  CALCIUM 8.8* 8.9  --  9.5  --   --  9.4  --   --  9.5  < > = values in this interval not displayed.   CBC:  Recent Labs Lab 11/28/16 1220 11/29/16 0441 12/02/16 0709  WBC 6.0 5.0 4.4  NEUTROABS 3.5  --   --   HGB 11.4* 11.2* 12.9*  HCT 33.2* 32.1* 37.8*  MCV 86.5 88.0 87.6  PLT 349 321  425     No results found for: HEPBSAG, HEPBSAB, HEPBIGM    Microbiology:  Recent Results (from the past 240 hour(s))  MRSA PCR Screening     Status: None   Collection Time: 11/28/16  5:41 PM  Result Value Ref Range Status   MRSA by PCR NEGATIVE NEGATIVE Final    Comment:        The GeneXpert MRSA Assay (FDA approved for NASAL specimens only), is one component of a comprehensive MRSA colonization surveillance program. It is not intended to diagnose MRSA infection nor to guide or monitor treatment  for MRSA infections.     Coagulation Studies: No results for input(s): LABPROT, INR in the last 72 hours.  Urinalysis: No results for input(s): COLORURINE, LABSPEC, PHURINE, GLUCOSEU, HGBUR, BILIRUBINUR, KETONESUR, PROTEINUR, UROBILINOGEN, NITRITE, LEUKOCYTESUR in the last 72 hours.  Invalid input(s): APPERANCEUR    Imaging: No results found.   Medications:    . amLODipine  5 mg Oral Daily  . aspirin EC  81 mg Oral Daily  . clindamycin  600 mg Oral TID  . clopidogrel  75 mg Oral Daily  . docusate sodium  100 mg Oral BID  . febuxostat  40 mg Oral Daily  . feeding supplement (ENSURE ENLIVE)  237 mL Oral TID BM  . heparin  5,000 Units Subcutaneous Q8H  . pravastatin  20 mg Oral q1800  . tamsulosin  0.4 mg Oral Daily   acetaminophen, docusate sodium, ipratropium  Assessment/ Plan:  83 y.o. [place] male with hypertension, CVA, gout, osteoarthritis, peripheral vascular disease, COPD/tobacco use, hyperlipidemia, coronary artery disease, BPH , who was admitted to [facility] on 11/28/2016   1. Acute Renal Failure. Admit Cr 1.66, now improved  2. Hyponatremia. Na was 141 on 11/16/2016 3. Hyperkalemia 4. Hypertension  Plan   Patient may have some underlying SIADH with his COPD.  -  Na improved to 134 this morning. 3% saline discontinued - fluid restriction to 1 L per day - f/u with PMD    LOS: 6 [facility] 5/31/20181:44 PM  [facility] [place], [place] [phone]

## 2016-12-04 NOTE — Clinical Social Work Placement (Signed)
   CLINICAL SOCIAL WORK PLACEMENT  NOTE  Date:  12/04/2016  Patient Details  Name: [name] Pierre Sr. MRN: [id number] Date of Birth: Jul 05, 1934  Clinical Social Work is seeking post-discharge placement for this patient at the Skilled  Nursing Facility level of care (*CSW will initial, date and re-position this form in  chart as items are completed):  Yes   Patient/family provided with [facility] Clinical Social Work Department's list of facilities offering this level of care within the geographic area requested by the patient (or if unable, by the patient's family).  Yes   Patient/family informed of their freedom to choose among providers that offer the needed level of care, that participate in Medicare, Medicaid or managed care program needed by the patient, have an available bed and are willing to accept the patient.  Yes   Patient/family informed of [facility]'s ownership interest in [facility] and [facility], as well as of the fact that they are under no obligation to receive care at these facilities.  PASRR submitted to EDS on 12/03/16     PASRR number received on 12/03/16     Existing PASRR number confirmed on       FL2 transmitted to all facilities in geographic area requested by pt/family on       FL2 transmitted to all facilities within larger geographic area on       Patient informed that his/her managed care company has contracts with or will negotiate with certain facilities, including the following:        Yes   Patient/family informed of bed offers received.  Patient chooses bed at  [facility])     Physician recommends and patient chooses bed at  [facility](SNF)    Patient to be transferred to  [facility]) on 12/04/16.  Patient to be transferred to facility by  (EMS)     Patient family notified on 12/04/16 of transfer.  Name of family member notified:   (DSS Guardian)     PHYSICIAN       Additional Comment:     _______________________________________________ York SpanielMonica Taylen Osorto, LCSW 12/04/2016, 3:41 PM

## 2016-12-04 NOTE — Care Management (Signed)
Patient discharging to SNF.  [facility]

## 2016-12-04 NOTE — Discharge Summary (Signed)
[facility] [place] at [facility]   PATIENT NAME: [name]    MR#:  [id number]  DATE OF BIRTH:  13-Jul-1933  DATE OF ADMISSION:  11/28/2016 ADMITTING PHYSICIAN: Altamese Dilling, MD  DATE OF DISCHARGE: 12/04/2016  PRIMARY CARE PHYSICIAN: [name], MD    ADMISSION DIAGNOSIS:  Dehydration [E86.0] Hyponatremia [E87.1] Knee pain [M25.569] AKI (acute kidney injury) (HCC) [N17.9] Chronic pain of both knees [M25.561, M25.562, G89.29]  DISCHARGE DIAGNOSIS:  Principal Problem:   Acute renal failure (ARF) (HCC) Active Problems:   Dehydration   Hyperkalemia   Altered mental status   SECONDARY DIAGNOSIS:   Past Medical History:  Diagnosis Date  . Arthritis   . Gout   . Hypertension   . Stroke [facility])    left sided weakness    HOSPITAL COURSE:   LinniesPrideis a 83 y.o.malewith a known history of Gout, hypertension, stroke, left-sided weakness- was admitted to hospital last week with complaint of dizziness and a fall and after workup noted not a significant. He was sent back to his facility, he is under the department of social services care.  * Acute renal failure due to meds (bactrim) and poor po intake Likely secondary to dehydration   Received IVF and creat back to normal  *Acute hyponatremia (hypovolemic hypo-osmolar) -sodium down to 125--122---121- 123 Pt asymptomatic -poor po intkae  Seen by nephrology---IVF NS - not improved much- Nephro suggesting 3% NS. - Improved with 3% saline. Stopped IV now. - sodium is normal and stable for > 24 hrs.  * Hyperkalemia Received Kayexalate and monitor  Now 4.3  * chronic right foot ulcers Recently took bactrim and keflex (came to the ER) -Wound conuslt -po clindamycin given for 6 days in hospital. Stop on  D/c.  * Hyponatremia Likely due to dehydration, check with IV fluids.   Nephrology on case   Now on 3% saline.  * Hypertension Continue home meds.  *  History of coronary artery disease Continue aspirin and Plavix and statin.   DISCHARGE CONDITIONS:   Stable.  CONSULTS OBTAINED:  Treatment Team:  Lamont Dowdy, MD  DRUG ALLERGIES:   Allergies  Allergen Reactions  . Hydrochlorothiazide     Documented on MAR  . Lisinopril     Documented on MAR    DISCHARGE MEDICATIONS:   Current Discharge Medication List    CONTINUE these medications which have NOT CHANGED   Details  acetaminophen (TYLENOL) 325 MG tablet Take 650 mg by mouth every 6 (six) hours as needed for moderate pain.    albuterol (PROVENTIL HFA;VENTOLIN HFA) 108 (90 Base) MCG/ACT inhaler Inhale 2 puffs into the lungs every 6 (six) hours as needed for wheezing or shortness of breath. Qty: 1 Inhaler, Refills: 2    amLODipine (NORVASC) 5 MG tablet Take 5 mg by mouth daily.    aspirin EC 81 MG tablet Take 81 mg by mouth daily.    clopidogrel (PLAVIX) 75 MG tablet Take 1 tablet (75 mg total) by mouth daily. Qty: 30 tablet, Refills: 11    docusate sodium (COLACE) 100 MG capsule Take 100 mg by mouth 2 (two) times daily.    febuxostat (ULORIC) 40 MG tablet Take 40 mg by mouth daily.    hypromellose (GENTEAL SEVERE) 0.3 % GEL ophthalmic ointment Place 1 application into the left eye as needed (irritation/watering).     ipratropium (ATROVENT) 0.03 % nasal spray Place 1 spray into both nostrils every 4 (four) hours as needed (congestion).  loratadine (CLARITIN) 10 MG tablet Take 10 mg by mouth every evening.     lovastatin (MEVACOR) 20 MG tablet Take 20 mg by mouth every evening.    meloxicam (MOBIC) 7.5 MG tablet Take 7.5 mg by mouth every evening.     tamsulosin (FLOMAX) 0.4 MG CAPS capsule Take 1 capsule (0.4 mg total) by mouth daily. Qty: 90 capsule, Refills: 3    feeding supplement, ENSURE ENLIVE, (ENSURE ENLIVE) LIQD Take 237 mLs by mouth 3 (three) times daily between meals. Qty: 237 mL, Refills: 12      STOP taking these medications      cephALEXin (KEFLEX) 500 MG capsule      DULoxetine (CYMBALTA) 30 MG capsule      sulfamethoxazole-trimethoprim (BACTRIM DS) 800-160 MG tablet          DISCHARGE INSTRUCTIONS:    Follow with PMD in 1-2 weeks.  If you experience worsening of your admission symptoms, develop shortness of breath, life threatening emergency, suicidal or homicidal thoughts you must seek medical attention immediately by calling 911 or calling your MD immediately  if symptoms less severe.  You Must read complete instructions/literature along with all the possible adverse reactions/side effects for all the Medicines you take and that have been prescribed to you. Take any new Medicines after you have completely understood and accept all the possible adverse reactions/side effects.   Please note  You were cared for by a hospitalist during your hospital stay. If you have any questions about your discharge medications or the care you received while you were in the hospital after you are discharged, you can call the unit and asked to speak with the hospitalist on call if the hospitalist that took care of you is not available. Once you are discharged, your primary care physician will handle any further medical issues. Please note that NO REFILLS for any discharge medications will be authorized once you are discharged, as it is imperative that you return to your primary care physician (or establish a relationship with a primary care physician if you do not have one) for your aftercare needs so that they can reassess your need for medications and monitor your lab values.    Today   CHIEF COMPLAINT:   Chief Complaint  Patient presents with  . Knee Pain    HISTORY OF PRESENT ILLNESS:  Lenox Bink  is a 83 y.o. male with a known history of Gout, hypertension, stroke, left-sided weakness- was admitted to hospital last week with complaint of dizziness and a fall and after workup noted not a significant. He was sent  back to his facility, he is under the department of social services care. Today he is sent here and patient is not able to give much history as he is confused. In workup in ER he is noted to be dehydrated with acute renal failure and elevated potassium. I reviewed his medications list at the facility and it shows that he was on Keflex and bactrim for last 5 days over there, but I did not find any diagnosis.   VITAL SIGNS:  Blood pressure (!) 151/82, pulse 92, temperature 97.8 F (36.6 C), temperature source Oral, resp. rate 18, height 5\' 2"  (1.575 m), weight 58.3 kg (128 lb 8 oz), SpO2 100 %.  I/O:   Intake/Output Summary (Last 24 hours) at 12/04/16 1041 Last data filed at 12/04/16 0900  Gross per 24 hour  Intake  480 ml  Output              550 ml  Net              -70 ml    PHYSICAL EXAMINATION:   GENERAL:  83 y.o.-year-old patient lying in the bed with no acute distress.  EYES: Pupils equal, round, reactive to light and accommodation. No scleral icterus. Extraocular muscles intact.  HEENT: Head atraumatic, normocephalic. Oropharynx and nasopharynx clear.  NECK:  Supple, no jugular venous distention. No thyroid enlargement, no tenderness.  LUNGS: Normal breath sounds bilaterally, no wheezing, rales, rhonchi. No use of accessory muscles of respiration.  CARDIOVASCULAR: S1, S2 normal. No murmurs, rubs, or gallops.  ABDOMEN: Soft, nontender, nondistended. Bowel sounds present. No organomegaly or mass. Condom catheter in place. EXTREMITIES: No cyanosis, clubbing or edema b/l.  Right ankle chronic ulcers+  NEUROLOGIC: Cranial nerves II through XII are intact. No focal Motor or sensory deficits b/l.   PSYCHIATRIC:  patient is alert and oriented x 2.  SKIN: as above  DATA REVIEW:   CBC  Recent Labs Lab 12/02/16 0709  WBC 4.4  HGB 12.9*  HCT 37.8*  PLT 425    Chemistries   Recent Labs Lab 11/28/16 1220  12/04/16 0427  NA 128*  < > 134*  K 5.4*  < > 4.8  CL  98*  < > 101  CO2 19*  < > 26  GLUCOSE 63*  < > 112*  BUN 30*  < > 33*  CREATININE 1.66*  < > 0.95  CALCIUM 9.1  < > 9.5  AST 76*  --   --   ALT 25  --   --   ALKPHOS 96  --   --   BILITOT 0.5  --   --   < > = values in this interval not displayed.  Cardiac Enzymes No results for input(s): TROPONINI in the last 168 hours.  Microbiology Results  Results for orders placed or performed during the hospital encounter of 11/28/16  MRSA PCR Screening     Status: None   Collection Time: 11/28/16  5:41 PM  Result Value Ref Range Status   MRSA by PCR NEGATIVE NEGATIVE Final    Comment:        The GeneXpert MRSA Assay (FDA approved for NASAL specimens only), is one component of a comprehensive MRSA colonization surveillance program. It is not intended to diagnose MRSA infection nor to guide or monitor treatment for MRSA infections.     RADIOLOGY:  No results found.  EKG:   Orders placed or performed during the hospital encounter of 02/18/16  . EKG 12-Lead  . EKG 12-Lead      Management plans discussed with the patient, family and they are in agreement.  CODE STATUS:     Code Status Orders        Start     Ordered   11/28/16 1731  Full code  Continuous     11/28/16 1731    Code Status History    Date Active Date Inactive Code Status Order ID Comments User Context   11/17/2016  4:37 AM 11/18/2016 10:04 PM Full Code [id number]  Ihor AustinPyreddy, Pavan, MD Inpatient   09/30/2016 11:37 AM 09/30/2016  4:57 PM Full Code [id number]  Schnier, Latina CraverGregory G, MD Inpatient      TOTAL TIME TAKING CARE OF THIS PATIENT: 35 minutes.    Altamese DillingVACHHANI, Fidelia Cathers M.D on 12/04/2016 at 10:41 AM  Between 7am to  6pm - Pager - [phone]  After 6pm go to [email or web address] - password [email or web address]  [facility]  Office  [phone]  CC: Primary care physician; [name], MD   Note: This dictation was prepared with Dragon dictation along with smaller phrase technology. Any  transcriptional errors that result from this process are unintentional.

## 2016-12-04 NOTE — Care Management Important Message (Signed)
Important Message  Patient Details  Name: [name] MeigsLinnies Slomski Sr. MRN: [id number] Date of Birth: December 12, 1933   Medicare Important Message Given:  Yes    [name], [name] Toppins [name], [name] 12/04/2016, 12:13 PM

## 2016-12-18 ENCOUNTER — Encounter (INDEPENDENT_AMBULATORY_CARE_PROVIDER_SITE_OTHER): Payer: Medicare HMO

## 2016-12-18 ENCOUNTER — Ambulatory Visit (INDEPENDENT_AMBULATORY_CARE_PROVIDER_SITE_OTHER): Payer: Medicare HMO | Admitting: Vascular Surgery

## 2016-12-30 ENCOUNTER — Encounter: Payer: Medicare HMO | Attending: Internal Medicine | Admitting: Internal Medicine

## 2016-12-30 ENCOUNTER — Other Ambulatory Visit
Admission: RE | Admit: 2016-12-30 | Discharge: 2016-12-30 | Disposition: A | Discharge status: Home / Self Care | Payer: Medicare HMO | Source: Ambulatory Visit | Attending: Internal Medicine | Admitting: Internal Medicine

## 2016-12-30 DIAGNOSIS — F17210 Nicotine dependence, cigarettes, uncomplicated: Secondary | ICD-10-CM | POA: Insufficient documentation

## 2016-12-30 DIAGNOSIS — E785 Hyperlipidemia, unspecified: Secondary | ICD-10-CM | POA: Insufficient documentation

## 2016-12-30 DIAGNOSIS — L97523 Non-pressure chronic ulcer of other part of left foot with necrosis of muscle: Secondary | ICD-10-CM | POA: Insufficient documentation

## 2016-12-30 DIAGNOSIS — I679 Cerebrovascular disease, unspecified: Secondary | ICD-10-CM | POA: Diagnosis not present

## 2016-12-30 DIAGNOSIS — M1990 Unspecified osteoarthritis, unspecified site: Secondary | ICD-10-CM | POA: Diagnosis not present

## 2016-12-30 DIAGNOSIS — I70245 Atherosclerosis of native arteries of left leg with ulceration of other part of foot: Secondary | ICD-10-CM | POA: Insufficient documentation

## 2016-12-30 DIAGNOSIS — L97311 Non-pressure chronic ulcer of right ankle limited to breakdown of skin: Secondary | ICD-10-CM | POA: Insufficient documentation

## 2016-12-30 DIAGNOSIS — M109 Gout, unspecified: Secondary | ICD-10-CM | POA: Insufficient documentation

## 2016-12-30 DIAGNOSIS — G8194 Hemiplegia, unspecified affecting left nondominant side: Secondary | ICD-10-CM | POA: Diagnosis not present

## 2016-12-30 DIAGNOSIS — L089 Local infection of the skin and subcutaneous tissue, unspecified: Secondary | ICD-10-CM | POA: Diagnosis present

## 2016-12-30 DIAGNOSIS — I10 Essential (primary) hypertension: Secondary | ICD-10-CM | POA: Insufficient documentation

## 2016-12-30 DIAGNOSIS — L97513 Non-pressure chronic ulcer of other part of right foot with necrosis of muscle: Secondary | ICD-10-CM | POA: Diagnosis not present

## 2016-12-30 DIAGNOSIS — I70235 Atherosclerosis of native arteries of right leg with ulceration of other part of foot: Secondary | ICD-10-CM | POA: Insufficient documentation

## 2016-12-30 NOTE — Progress Notes (Signed)
Subjective:    Patient ID: [name] Meigs Sr., male    DOB: 06/29/1934, 83 y.o.   MRN: [id number] Chief Complaint  Patient presents with  . Follow-up    post op   Patient presents for his first post procedure follow-up. The patient is status post a left lower extremity angiogram with intervention on 09/30/2016 for left lower extremity pain. The patient's postprocedure course has been uneventful. Today he presents with no complaints with the exception of minimal improvement in his left lower extremity discomfort. He is complaining of swelling to the left lower extremity. Patient denies any fever, nausea or vomiting.   Review of Systems  Constitutional: Negative.   HENT: Negative.   Eyes: Negative.   Respiratory: Negative.   Cardiovascular:       Left lower extremity discomfort  Gastrointestinal: Negative.   Endocrine: Negative.   Genitourinary: Negative.   Musculoskeletal: Negative.   Skin: Negative.   Allergic/Immunologic: Negative.   Neurological: Negative.   Hematological: Negative.   Psychiatric/Behavioral: Negative.       Objective:   Physical Exam  Constitutional: He is oriented to person, place, and time. He appears well-developed and well-nourished. No distress.  HENT:  Head: Normocephalic and atraumatic.  Eyes: Conjunctivae are normal. Pupils are equal, round, and reactive to light.  Neck: Normal range of motion.  Cardiovascular: Normal rate, regular rhythm, normal heart sounds and intact distal pulses.   Pulses:      Radial pulses are 2+ on the right side, and 2+ on the left side.       Dorsalis pedis pulses are 1+ on the right side.       Posterior tibial pulses are 1+ on the right side, and 2+ on the left side.  Hard to palpate left lower extremity pedal pulses however are the patient's foot is warm.  Pulmonary/Chest: Effort normal.  Musculoskeletal: Normal range of motion. He exhibits edema (Left lower extremity mild edema).  Neurological: He is alert and  oriented to person, place, and time.  Skin: Skin is warm and dry. He is not diaphoretic.  Psychiatric: He has a normal mood and affect. His behavior is normal. Judgment and thought content normal.  Vitals reviewed.  BP 139/82   Pulse 73   Resp 16   Wt 125 lb (56.7 kg)   BMI 22.86 kg/m   Past Medical History:  Diagnosis Date  . Arthritis   . Gout   . Hypertension   . Stroke [facility])    left sided weakness   Social History   Social History  . Marital status: Single    Spouse name: N/A  . Number of children: N/A  . Years of education: N/A   Occupational History  . retired    Social History Main Topics  . Smoking status: Current Every Day Smoker    Packs/day: 1.00    Years: 50.00    Types: Cigarettes  . Smokeless tobacco: Never Used  . Alcohol use No     Comment: previous heavy drinker now 1x month  . Drug use: No  . Sexual activity: Not Currently   Other Topics Concern  . Not on file   Social History Narrative  . No narrative on file   Past Surgical History:  Procedure Laterality Date  . INGUINAL HERNIA REPAIR Right 09/21/2015   Procedure: HERNIA REPAIR INGUINAL ADULT;  Surgeon: [name], MD;  Location: [facility] ORS;  Service: General;  Laterality: Right;  . LOWER EXTREMITY ANGIOGRAPHY Left 09/30/2016  Procedure: Lower Extremity Angiography;  Surgeon: [name], MD;  Location: [facility] INVASIVE CV LAB;  Service: Cardiovascular;  Laterality: Left;   Family History  Problem Relation Age of Onset  . Kidney disease Neg Hx   . Prostate cancer Neg Hx    Allergies  Allergen Reactions  . Hydrochlorothiazide     Documented on MAR  . Lisinopril     Documented on MAR      Assessment & Plan:  Patient presents for his first post procedure follow-up. The patient is status post a left lower extremity angiogram with intervention on 09/30/2016 for left lower extremity pain. The patient's postprocedure course has been uneventful. Today he presents with no  complaints with the exception of minimal improvement in his left lower extremity discomfort. He is complaining of swelling to the left lower extremity. Patient denies any fever, nausea or vomiting.  1. PAD (peripheral artery disease) (HCC) - stable Patient is status post a left lower extremity angiogram with intervention for severe peripheral artery disease. Patient presents today with improved left lower extremity pain however there is still some discomfort Patient is also most likely experiencing some reperfusion pain to the left lower extremity Encouraged the patient to elevate his extremity and remain active Will bring the patient back in 3 months for an ABI in the left lower extremity arterial I have discussed with the patient at length the risk factors for and pathogenesis of atherosclerotic disease and encouraged a healthy diet, regular exercise regimen and blood pressure / glucose control.  The patient was encouraged to call the office in the interim if he experiences any claudication like symptoms, rest pain or ulcers to his feet / toes.  - VAS [place] ABI WITH/WO TBI; Future - VAS [place] LOWER EXTREMITY ARTERIAL DUPLEX; Future  2. Tobacco dependence - stable I have discussed (approximately 5 minutes) with the patient the role of tobacco in the pathogenesis of atherosclerosis and its effect on the progression of the disease, impact on the durability of interventions and its limitations on the formation of collateral pathways. I have recommended absolute tobacco cessation. I have discussed various options available for assistance with tobacco cessation including over the counter methods (Nicotine gum, patch and lozenges). We also discussed prescription options (Chantix, Nicotine Inhaler / Nasal Spray). The patient is not interested in pursuing any prescription tobacco cessation options at this time. The patient voices their understanding.   3. Hyperlipidemia, unspecified hyperlipidemia type -  stable Encouraged good control as its slows the progression of atherosclerotic disease  Current Outpatient Prescriptions on File Prior to Visit  Medication Sig Dispense Refill  . acetaminophen (TYLENOL) 325 MG tablet Take 650 mg by mouth every 6 (six) hours as needed for moderate pain.    Marland Kitchen albuterol (PROVENTIL HFA;VENTOLIN HFA) 108 (90 Base) MCG/ACT inhaler Inhale 2 puffs into the lungs every 6 (six) hours as needed for wheezing or shortness of breath. 1 Inhaler 2  . amLODipine (NORVASC) 5 MG tablet Take 5 mg by mouth daily.    Marland Kitchen aspirin EC 81 MG tablet Take 81 mg by mouth daily.    . clopidogrel (PLAVIX) 75 MG tablet Take 1 tablet (75 mg total) by mouth daily. 30 tablet 11  . docusate sodium (COLACE) 100 MG capsule Take 100 mg by mouth 2 (two) times daily.    . febuxostat (ULORIC) 40 MG tablet Take 40 mg by mouth daily.    . hypromellose (GENTEAL SEVERE) 0.3 % GEL ophthalmic ointment Place 1 application into  the left eye as needed (irritation/watering).     Marland Kitchen. ipratropium (ATROVENT) 0.03 % nasal spray Place 1 spray into both nostrils every 4 (four) hours as needed (congestion).     Marland Kitchen. loratadine (CLARITIN) 10 MG tablet Take 10 mg by mouth every evening.     . lovastatin (MEVACOR) 20 MG tablet Take 20 mg by mouth every evening.    . meloxicam (MOBIC) 7.5 MG tablet Take 7.5 mg by mouth every evening.     . tamsulosin (FLOMAX) 0.4 MG CAPS capsule Take 1 capsule (0.4 mg total) by mouth daily. 90 capsule 3   No current facility-administered medications on file prior to visit.     There are no Patient Instructions on file for this visit. No Follow-up on file.   Aqueelah Cotrell A Jakaiya Netherland, PA-C

## 2016-12-31 NOTE — Progress Notes (Signed)
[name], [name] ([id number]) Visit Report for 12/30/2016 Allergy List Details Patient Name: [name], [name] Date of Service: 12/30/2016 8:00 AM Medical Record Number: [id number] Patient Account Number: [id number] Date of Birth/Sex: Sep 08, 1933 (83 y.o. Male) Treating RN: [name] Primary Care [name]: [name] Other Clinician: Referring [name]: [name] Treating [name]/Extender: [name] in Treatment: 0 Allergies Active Allergies hydrochlorothiazide lisinopril Allergy Notes Electronic Signature(s) Signed: 12/30/2016 4:48:20 PM By: [name] Entered By: [name] on 12/30/2016 08:20:37 [name], [name] ([id number]) -------------------------------------------------------------------------------- Arrival Information Details Patient Name: [name] Date of Service: 12/30/2016 8:00 AM Medical Record Number: [id number] Patient Account Number: [id number] Date of Birth/Sex: 1934/06/04 (83 y.o. Male) Treating RN: [name] Primary Care [name]: [name] Other Clinician: Referring [name]: [name] Treating [name]/Extender: [name] in Treatment: 0 Visit Information Patient Arrived: Wheel Chair Arrival Time: 08:18 Accompanied By: self Transfer Assistance: Manual Patient Identification Verified: Yes Secondary Verification Process Yes Completed: Electronic Signature(s) Signed: 12/30/2016 4:48:20 PM By: [name] Entered By: [name] on 12/30/2016 08:19:46 [name], [name] ([id number]) -------------------------------------------------------------------------------- Clinic Level of Care Assessment Details Patient Name: [name] Date of Service: 12/30/2016 8:00 AM Medical Record Number: [id number] Patient Account Number: [id number] Date of Birth/Sex: 1933/12/16 (83 y.o. Male) Treating RN: [name] Primary Care Shawnte Demarest: [name] Other Clinician: Referring [name]: [name] Treating  [name]/Extender: [name] in Treatment: 0 Clinic Level of Care Assessment Items TOOL 2 Quantity Score []  - Use when only an EandM is performed on the INITIAL visit 0 ASSESSMENTS - Nursing Assessment / Reassessment X - General Physical Exam (combine w/ comprehensive assessment (listed just 1 20 below) when performed on new pt. evals) X - Comprehensive Assessment (HX, ROS, Risk Assessments, Wounds Hx, etc.) 1 25 ASSESSMENTS - Wound and Skin Assessment / Reassessment []  - Simple Wound Assessment / Reassessment - one wound 0 X - Complex Wound Assessment / Reassessment - multiple wounds 3 5 []  - Dermatologic / Skin Assessment (not related to wound area) 0 ASSESSMENTS - Ostomy and/or Continence Assessment and Care []  - Incontinence Assessment and Management 0 []  - Ostomy Care Assessment and Management (repouching, etc.) 0 PROCESS - Coordination of Care X - Simple Patient / Family Education for ongoing care 1 15 []  - Complex (extensive) Patient / Family Education for ongoing care 0 []  - Staff obtains Chiropractor, Records, Test Results / Process Orders 0 []  - Staff telephones HHA, Nursing Homes / Clarify orders / etc 0 []  - Routine Transfer to another Facility (non-emergent condition) 0 []  - Routine Hospital Admission (non-emergent condition) 0 []  - New Admissions / Manufacturing engineer / Ordering NPWT, Apligraf, etc. 0 []  - Emergency Hospital Admission (emergent condition) 0 X - Simple Discharge Coordination 1 10 [name], [name] ([id number]) []  - Complex (extensive) Discharge Coordination 0 PROCESS - Special Needs []  - Pediatric / Minor Patient Management 0 []  - Isolation Patient Management 0 []  - Hearing / Language / Visual special needs 0 []  - Assessment of Community assistance (transportation, D/C planning, etc.) 0 []  - Additional assistance / Altered mentation 0 []  - Support Surface(s) Assessment (bed, cushion, seat, etc.) 0 INTERVENTIONS - Wound Cleansing /  Measurement X - Wound Imaging (photographs - any number of wounds) 1 5 []  - Wound Tracing (instead of photographs) 0 []  - Simple Wound Measurement - one wound 0 X - Complex Wound Measurement - multiple wounds 3 5 []  - Simple Wound Cleansing - one wound 0 X - Complex Wound Cleansing - multiple wounds 3  5 INTERVENTIONS - Wound Dressings X - Small Wound Dressing one or multiple wounds 3 10 []  - Medium Wound Dressing one or multiple wounds 0 []  - Large Wound Dressing one or multiple wounds 0 []  - Application of Medications - injection 0 INTERVENTIONS - Miscellaneous []  - External ear exam 0 []  - Specimen Collection (cultures, biopsies, blood, body fluids, etc.) 0 []  - Specimen(s) / Culture(s) sent or taken to Lab for analysis 0 []  - Patient Transfer (multiple staff / Michiel [name] Lift / Similar devices) 0 []  - Simple Staple / Suture removal (25 or less) 0 []  - Complex Staple / Suture removal (26 or more) 0 [name], [name] ([id number]) []  - Hypo / Hyperglycemic Management (close monitor of Blood Glucose) 0 []  - Ankle / Brachial Index (ABI) - do not check if billed separately 0 Has the patient been seen at the hospital within the last three years: Yes Total Score: 150 Level Of Care: New/Established - Level 4 Electronic Signature(s) Unsigned Entered By: [name] on 12/31/2016 09:45:59 Signature(s): Date(s): [name], [name] ([id number]) -------------------------------------------------------------------------------- Encounter Discharge Information Details Patient Name: [name], [name] Date of Service: 12/30/2016 8:00 AM Medical Record Number: [id number] Patient Account Number: [id number] Date of Birth/Sex: December 23, 1933 (83 y.o. Male) Treating RN: [name] Primary Care [name]: [name] Other Clinician: Referring [name]: [name] Treating [name]/Extender: [name] [place] in Treatment: 0 Encounter Discharge Information Items Schedule Follow-up Appointment:  No Medication Reconciliation completed No and provided to Patient/Care [name]: Provided on Clinical Summary of Care: 12/30/2016 Form Type Recipient Paper Patient LP Electronic Signature(s) Signed: 12/30/2016 9:30:22 AM By: Gwenlyn Perking Entered By: Gwenlyn Perking on 12/30/2016 09:30:22 Hamil, [name] ([id number]) -------------------------------------------------------------------------------- Lower Extremity Assessment Details Patient Name: Marcano, [name] Date of Service: 12/30/2016 8:00 AM Medical Record Number: [id number] Patient Account Number: [id number] Date of Birth/Sex: [birth year] (83 y.o. Male) Treating RN: [name] Primary Care Snigdha Howser: [name] Other Clinician: Referring Rupa Lagan: [name] Treating Kenney Going/Extender: [name] in Treatment: 0 Vascular Assessment Pulses: Dorsalis Pedis Palpable: [Left:No] [Right:No] Doppler Audible: [Left:Inaudible] [Right:Inaudible] Posterior Tibial Palpable: [Left:No] [Right:No] Doppler Audible: [Left:Inaudible] [Right:Inaudible] Extremity colors, hair growth, and conditions: Extremity Color: [Left:Normal] [Right:Normal] Hair Growth on Extremity: [Left:No] [Right:No] Temperature of Extremity: [Left:Warm] [Right:Warm] Capillary Refill: [Left:< 3 seconds] [Right:< 3 seconds] Toe Nail Assessment Left: Right: Thick: Yes Yes Discolored: No No Deformed: No No Improper Length and Hygiene: No No Notes unable to obtain ABI Electronic Signature(s) Signed: 12/30/2016 4:48:20 PM By: [name] Entered By: [name] on 12/30/2016 08:59:48 [employer], [name] ([id number]) -------------------------------------------------------------------------------- Multi Wound Chart Details Patient Name: [name] Date of Service: 12/30/2016 8:00 AM Medical Record Number: [id number] Patient Account Number: [id number] Date of Birth/Sex: 1934/01/11 (83 y.o. Male) Treating RN: [name] Primary Care Missey Hasley:  [name] Other Clinician: Referring Silvanna Ohmer: [name] Treating Labresha Mellor/Extender: [name] in Treatment: 0 Vital Signs Height(in): Pulse(bpm): 76 Weight(lbs): Blood Pressure 143/71 (mmHg): Body Mass Index(BMI): Temperature(F): 97.5 Respiratory Rate 18 (breaths/min): Photos: [1:No Photos] [2:No Photos] [3:No Photos] Wound Location: [1:Right Ankle - Lateral] [2:Right Malleolus] [3:Right Toe Third] Wounding Event: [1:Gradually Appeared] [2:Gradually Appeared] [3:Gradually Appeared] Primary Etiology: [1:To be determined] [2:To be determined] [3:To be determined] Comorbid History: [1:Hypertension, Peripheral Venous Disease, Gout, Osteoarthritis] [2:Hypertension, Peripheral Venous Disease, Gout, Osteoarthritis] [3:Hypertension, Peripheral Venous Disease, Gout, Osteoarthritis] Date Acquired: [1:12/04/2016] [2:12/04/2016] [3:12/04/2016] [name] of Treatment: [1:0] [2:0] [3:0] Wound Status: [1:Open] [2:Open] [3:Open] Pending Amputation on Yes [2:Yes] [3:Yes] Presentation: Measurements L x W x D 2.6x2.8x0.3 [2:2.5x2.6x0.2] [3:2.3x2x0.2] (cm) Area (cm) : [  1:5.718] [2:5.105] [3:3.613] Volume (cm) : [1:1.715] [2:1.021] [3:0.723] Classification: [1:Full Thickness With Exposed Support Structures] [2:Full Thickness With Exposed Support Structures] [3:Full Thickness Without Exposed Support Structures] Exudate Amount: [1:Large] [2:Large] [3:Large] Exudate Type: [1:Serous] [2:Serous] [3:Purulent] Exudate Color: [1:amber] [2:amber] [3:yellow, brown, green] Foul Odor After [1:No] [2:No] [3:Yes] Cleansing: Odor Anticipated Due to N/A [2:N/A] [3:No] Product Use: Wound Margin: [1:Flat and Intact] [2:Flat and Intact] [3:Flat and Intact] Granulation Amount: [1:Medium (34-66%)] [2:Medium (34-66%)] [3:None Present (0%)] Granulation Quality: [1:Red] [2:Red] [3:N/A] Necrotic Amount: [1:Medium (34-66%)] [2:Medium (34-66%)] [3:Large (67-100%)] Necrotic Tissue: Adherent Slough  Adherent [employer], Adherent Slough Exposed Structures: Fat Layer (Subcutaneous Fat Layer (Subcutaneous Fascia: No Tissue) Exposed: Yes Tissue) Exposed: Yes Fat Layer (Subcutaneous Fascia: No Fascia: No Tissue) Exposed: No Tendon: No Tendon: No Tendon: No Muscle: No Muscle: No Muscle: No Joint: No Joint: No Joint: No Bone: No Bone: No Bone: No Epithelialization: None None None Periwound Skin Texture: Excoriation: No Excoriation: No Excoriation: No Induration: No Induration: No Induration: No Callus: No Callus: No Callus: No Crepitus: No Crepitus: No Crepitus: No Rash: No Rash: No Rash: No Scarring: No Scarring: No Scarring: No Periwound Skin Maceration: No Maceration: No Maceration: No Moisture: Dry/Scaly: No Dry/Scaly: No Dry/Scaly: No Periwound Skin Color: Atrophie Blanche: No Atrophie Blanche: No Atrophie Blanche: No Cyanosis: No Cyanosis: No Cyanosis: No Ecchymosis: No Ecchymosis: No Ecchymosis: No Erythema: No Erythema: No Erythema: No Hemosiderin Staining: No Hemosiderin Staining: No Hemosiderin Staining: No Mottled: No Mottled: No Mottled: No Pallor: No Pallor: No Pallor: No Rubor: No Rubor: No Rubor: No Temperature: No Abnormality No Abnormality No Abnormality Tenderness on Yes Yes Yes Palpation: Wound Preparation: Ulcer Cleansing: Ulcer Cleansing: Ulcer Cleansing: Rinsed/Irrigated with Rinsed/Irrigated with Rinsed/Irrigated with Saline Saline Saline Topical Anesthetic Topical Anesthetic Topical Anesthetic Applied: Other: lidocaine Applied: Other: lidocaine Applied: Other: lidocaine 4% 4% 4% Assessment Notes: N/A N/A maggots found in this wound - approximately 10 found and picked out and put in a zip lock bag for disposal Treatment Notes Electronic Signature(s) Signed: 12/30/2016 4:48:20 PM By: [name] Entered By: [name] on 12/30/2016 09:03:25 Causey, [name]  ([id number]) -------------------------------------------------------------------------------- Multi-Disciplinary Care Plan Details Patient Name: [name] Date of Service: 12/30/2016 8:00 AM Medical Record Number: [id number] Patient Account Number: [id number] Date of Birth/Sex: 12/13/1933 (83 y.o. Male) Treating RN: [name] Primary Care Creg Gilmer: [name] Other Clinician: Referring Corean Yoshimura: [name] Treating Jackelynn Hosie/Extender: [name] Leonardo in Treatment: 0 Active Inactive ` Abuse / Safety / Falls / Self Care Management Nursing Diagnoses: Potential for falls Goals: Patient will not experience any injury related to falls Date Initiated: 12/30/2016 Target Resolution Date: 02/27/2017 Goal Status: Active Interventions: Assess fall risk on admission and as needed Notes: ` Orientation to the Wound Care Program Nursing Diagnoses: Knowledge deficit related to the wound healing center program Goals: Patient/caregiver will verbalize understanding of the Wound Healing Center Program Date Initiated: 12/30/2016 Target Resolution Date: 02/27/2017 Goal Status: Active Interventions: Provide education on orientation to the wound center Notes: ` Wound/Skin Impairment Nursing Diagnoses: Impaired tissue integrity Auletta, [name] ([id number]) Goals: Ulcer/skin breakdown will have a volume reduction of 30% by week 4 Date Initiated: 12/30/2016 Target Resolution Date: 02/27/2017 Goal Status: Active Ulcer/skin breakdown will have a volume reduction of 50% by week 8 Date Initiated: 12/30/2016 Target Resolution Date: 02/27/2017 Goal Status: Active Ulcer/skin breakdown will have a volume reduction of 80% by week 12 Date Initiated: 12/30/2016 Target Resolution Date: 02/27/2017 Goal Status: Active Ulcer/skin breakdown will heal within 14 [name] Date  Initiated: 12/30/2016 Target Resolution Date: 02/27/2017 Goal Status: Active Interventions: Assess patient/caregiver ability to  obtain necessary supplies Assess patient/caregiver ability to perform ulcer/skin care regimen upon admission and as needed Assess ulceration(s) every visit Notes: Electronic Signature(s) Signed: 12/30/2016 4:48:20 PM By: [name] Entered By: [name] on 12/30/2016 09:02:37 Row, [name] ([id number]) -------------------------------------------------------------------------------- Pain Assessment Details Patient Name: [name] Date of Service: 12/30/2016 8:00 AM Medical Record Number: [id number] Patient Account Number: [id number] Date of Birth/Sex: 12-Oct-1933 (83 y.o. Male) Treating RN: [name] Primary Care Kura Bethards: [name] Other Clinician: Referring Barre Aydelott: [name] Treating Lawson Mahone/Extender: [name] in Treatment: 0 Active Problems Location of Pain Severity and Description of Pain Patient Has Paino No Site Locations Pain Management and Medication Current Pain Management: Notes Topical or injectable lidocaine is offered to patient for acute pain when surgical debridement is performed. If needed, Patient is instructed to use over the counter pain medication for the following 24-48 hours after debridement. Wound care MDs do not prescribed pain medications. Patient has chronic pain or uncontrolled pain. Patient has been instructed to make an appointment with their Primary Care Physician for pain management. Electronic Signature(s) Signed: 12/30/2016 4:48:20 PM By: [name] Entered By: [name] on 12/30/2016 08:20:02 Peri, [name] ([id number]) -------------------------------------------------------------------------------- Wound Assessment Details Patient Name: Dotson, [name] Date of Service: 12/30/2016 8:00 AM Medical Record Number: [id number] Patient Account Number: [id number] Date of Birth/Sex: 08/17/1933 (83 y.o. Male) Treating RN: [name] Primary Care Ayaka Andes: [name] Other Clinician: Referring  Kaleisha Bhargava: [name] Treating Kele Withem/Extender: [name] in Treatment: 0 Wound Status Wound Number: 1 Primary To be determined Etiology: Wound Location: Right Ankle - Lateral Wound Open Wounding Event: Gradually Appeared Status: Date Acquired: 12/04/2016 Comorbid Hypertension, Peripheral Venous [name] Of Treatment: 0 History: Disease, Gout, Osteoarthritis Clustered Wound: No Pending Amputation On Presentation Photos Photo Uploaded By: [name] on 12/30/2016 13:02:00 Wound Measurements Length: (cm) 2.6 Width: (cm) 2.8 Depth: (cm) 0.3 Area: (cm) 5.718 Volume: (cm) 1.715 % Reduction in Area: % Reduction in Volume: Epithelialization: None Tunneling: No Undermining: No Wound Description Full Thickness With Exposed Classification: Support Structures Wound Margin: Flat and Intact Exudate Large Amount: Exudate Type: Serous Exudate Color: amber Foul Odor After Cleansing: No Slough/Fibrino Yes Wound Bed Granulation Amount: Medium (34-66%) Exposed Structure Granulation Quality: Red Fascia Exposed: No Nissen, [name] ([id number]) Necrotic Amount: Medium (34-66%) Fat Layer (Subcutaneous Tissue) Exposed: Yes Necrotic Quality: Adherent Slough Tendon Exposed: No Muscle Exposed: No Joint Exposed: No Bone Exposed: No Periwound Skin Texture Texture Color No Abnormalities Noted: No No Abnormalities Noted: No Callus: No Atrophie Blanche: No Crepitus: No Cyanosis: No Excoriation: No Ecchymosis: No Induration: No Erythema: No Rash: No Hemosiderin Staining: No Scarring: No Mottled: No Pallor: No Moisture Rubor: No No Abnormalities Noted: No Dry / Scaly: No Temperature / Pain Maceration: No Temperature: No Abnormality Tenderness on Palpation: Yes Wound Preparation Ulcer Cleansing: Rinsed/Irrigated with Saline Topical Anesthetic Applied: Other: lidocaine 4%, Electronic Signature(s) Signed: 12/30/2016 4:48:20 PM By: [name] Entered  By: [name] on 12/30/2016 08:44:25 Lehr, [name] ([id number]) -------------------------------------------------------------------------------- Wound Assessment Details Patient Name: [name] Date of Service: 12/30/2016 8:00 AM Medical Record Number: [id number] Patient Account Number: [id number] Date of Birth/Sex: June 28, 1934 (83 y.o. Male) Treating RN: [name] Primary Care Sander Speckman: [name] Other Clinician: Referring Rishab Stoudt: [name] Treating Devarious Pavek/Extender: [name] in Treatment: 0 Wound Status Wound Number: 2 Primary To be determined Etiology: Wound Location: Right Malleolus Wound Open Wounding Event: Gradually Appeared Status:  Date Acquired: 12/04/2016 Comorbid Hypertension, Peripheral Venous [name] Of Treatment: 0 History: Disease, Gout, Osteoarthritis Clustered Wound: No Pending Amputation On Presentation Photos Photo Uploaded By: [name] on 12/30/2016 13:02:00 Wound Measurements Length: (cm) 2.5 Width: (cm) 2.6 Depth: (cm) 0.2 Area: (cm) 5.105 Volume: (cm) 1.021 % Reduction in Area: % Reduction in Volume: Epithelialization: None Tunneling: No Undermining: No Wound Description Full Thickness With Exposed Classification: Support Structures Wound Margin: Flat and Intact Exudate Large Amount: Exudate Type: Serous Exudate Color: amber Foul Odor After Cleansing: No Slough/Fibrino Yes Wound Bed Granulation Amount: Medium (34-66%) Exposed Structure Granulation Quality: Red Fascia Exposed: No Leinen, [name] ([id number]) Necrotic Amount: Medium (34-66%) Fat Layer (Subcutaneous Tissue) Exposed: Yes Necrotic Quality: Adherent Slough Tendon Exposed: No Muscle Exposed: No Joint Exposed: No Bone Exposed: No Periwound Skin Texture Texture Color No Abnormalities Noted: No No Abnormalities Noted: No Callus: No Atrophie Blanche: No Crepitus: No Cyanosis: No Excoriation: No Ecchymosis: No Induration:  No Erythema: No Rash: No Hemosiderin Staining: No Scarring: No Mottled: No Pallor: No Moisture Rubor: No No Abnormalities Noted: No Dry / Scaly: No Temperature / Pain Maceration: No Temperature: No Abnormality Tenderness on Palpation: Yes Wound Preparation Ulcer Cleansing: Rinsed/Irrigated with Saline Topical Anesthetic Applied: Other: lidocaine 4%, Electronic Signature(s) Signed: 12/30/2016 4:48:20 PM By: [name] Entered By: [name] on 12/30/2016 08:45:30 Creighton, [name] ([id number]) -------------------------------------------------------------------------------- Wound Assessment Details Patient Name: Renteria, [name] Date of Service: 12/30/2016 8:00 AM Medical Record Number: [id number] Patient Account Number: [id number] Date of Birth/Sex: 1933-11-02 (83 y.o. Male) Treating RN: [name] Primary Care Keevon Henney: [name] Other Clinician: Referring Leinani Lisbon: [name] Treating Shawnell Dykes/Extender: [name] in Treatment: 0 Wound Status Wound Number: 3 Primary To be determined Etiology: Wound Location: Right Toe Third Wound Open Wounding Event: Gradually Appeared Status: Date Acquired: 12/04/2016 Comorbid Hypertension, Peripheral Venous [name] Of Treatment: 0 History: Disease, Gout, Osteoarthritis Clustered Wound: No Pending Amputation On Presentation Photos Photo Uploaded By: [name] on 12/30/2016 13:02:28 Wound Measurements Length: (cm) 2.3 Width: (cm) 2 Depth: (cm) 0.2 Area: (cm) 3.613 Volume: (cm) 0.723 % Reduction in Area: % Reduction in Volume: Epithelialization: None Tunneling: No Undermining: No Wound Description Full Thickness Without Exposed Classification: Support Structures Wound Margin: Flat and Intact Exudate Large Amount: Exudate Type: Purulent Exudate Color: yellow, brown, green Foul Odor After Cleansing: Yes Due to Product Use: No Slough/Fibrino Yes Wound Bed Granulation Amount: None Present  (0%) Exposed Structure Necrotic Amount: Large (67-100%) Fascia Exposed: No Corkins, [name] ([id number]) Necrotic Quality: Eschar, Adherent Slough Fat Layer (Subcutaneous Tissue) Exposed: No Tendon Exposed: No Muscle Exposed: No Joint Exposed: No Bone Exposed: No Periwound Skin Texture Texture Color No Abnormalities Noted: No No Abnormalities Noted: No Callus: No Atrophie Blanche: No Crepitus: No Cyanosis: No Excoriation: No Ecchymosis: No Induration: No Erythema: No Rash: No Hemosiderin Staining: No Scarring: No Mottled: No Pallor: No Moisture Rubor: No No Abnormalities Noted: No Dry / Scaly: No Temperature / Pain Maceration: No Temperature: No Abnormality Tenderness on Palpation: Yes Wound Preparation Ulcer Cleansing: Rinsed/Irrigated with Saline Topical Anesthetic Applied: Other: lidocaine 4%, Assessment Notes maggots found in this wound - approximately 10 found and picked out and put in a zip lock bag for disposal Electronic Signature(s) Signed: 12/30/2016 4:48:20 PM By: [name] Entered By: [name] on 12/30/2016 08:47:17 Dun, [name] ([id number]) -------------------------------------------------------------------------------- Vitals Details Patient Name: [name] Date of Service: 12/30/2016 8:00 AM Medical Record Number: [id number] Patient Account Number: [id number] Date of Birth/Sex: 1934-03-14 (83 y.o. Male) Treating RN: [name]  Primary Care Bond Grieshop: MILES, LINDA Other Clinician: Referring Jennene Downie: [name] McleanMILES, LINDA Treating Zharia Conrow/Extender: [name] CaulOBSON, MICHAEL G [name] in Treatment: 0 Vital Signs Time Taken: 08:25 Temperature (F): 97.5 Pulse (bpm): 76 Respiratory Rate (breaths/min): 18 Blood Pressure (mmHg): 143/71 Reference Range: 80 - 120 mg / dl Electronic Signature(s) Signed: 12/30/2016 4:48:20 PM By: [name] Sitesorthy, Joanna Entered By: [name] Sitesorthy, Joanna on 12/30/2016 08:29:01

## 2016-12-31 NOTE — Progress Notes (Signed)
[name] [name] [name], [name] [name] [name] ([id number]) Visit Report for 12/30/2016 Abuse/Suicide Risk Screen Details Patient Name: [name] [name], [name] [name] Date of Service: 12/30/2016 8:00 AM Medical Record Number: [id number] Patient Account Number: [id number] Date of Birth/Sex: [birth year] (83 y.o. Male) Treating RN: Curtis Sites Primary Care Mariluz Crespo: Darreld Mclean Other Clinician: Referring Kassia Demarinis: Darreld Mclean Treating Haneen Bernales/Extender: Maxwell Caul Weeks in Treatment: 0 Abuse/Suicide Risk Screen Items Answer ABUSE/SUICIDE RISK SCREEN: Has anyone close to you tried to hurt or harm you recentlyo No Do you feel uncomfortable with anyone in your familyo No Has anyone forced you do things that you didnot want to doo No Do you have any thoughts of harming yourselfo No Patient displays signs or symptoms of abuse and/or neglect. No Electronic Signature(s) Signed: 12/30/2016 4:48:20 PM By: Curtis Sites Entered By: Curtis Sites on 12/30/2016 08:20:47 [name] [name] [name] ([id number]) -------------------------------------------------------------------------------- Activities of Daily Living Details Patient Name: [name] [name] [name] Date of Service: 12/30/2016 8:00 AM Medical Record Number: [id number] Patient Account Number: [id number] Date of Birth/Sex: 16-Apr-1934 (83 y.o. Male) Treating RN: Curtis Sites Primary Care Kahlin Mark: Darreld Mclean Other Clinician: Referring Duwane Gewirtz: Darreld Mclean Treating Nochum Fenter/Extender: Maxwell Caul Weeks in Treatment: 0 Activities of Daily Living Items Answer Activities of Daily Living (Please select one for each item) Drive Automobile Not Able Take Medications Need Assistance Use Telephone Need Assistance Care for Appearance Need Assistance Use Toilet Need Assistance Bath / Shower Need Assistance Dress Self Need Assistance Feed Self Completely Able Walk Need Assistance Get In / Out Bed Need Assistance Housework Need Assistance Prepare Meals Need Assistance Handle Money Need  Assistance Shop for Self Need Assistance Electronic Signature(s) Signed: 12/30/2016 4:48:20 PM By: Curtis Sites Entered By: Curtis Sites on 12/30/2016 08:21:22 [name] [name] [name] ([id number]) -------------------------------------------------------------------------------- Education Assessment Details Patient Name: [name] [name] [name] Date of Service: 12/30/2016 8:00 AM Medical Record Number: [id number] Patient Account Number: [id number] Date of Birth/Sex: May 26, 1934 (83 y.o. Male) Treating RN: Curtis Sites Primary Care Kyian Obst: Darreld Mclean Other Clinician: Referring Carmina Walle: Darreld Mclean Treating Lycan Davee/Extender: Altamese [place] in Treatment: 0 Primary Learner Assessed: Caregiver SNF nurses Learning Preferences/Education Level/Primary Language Learning Preference: Printed Material Highest Education Level: College or Above Preferred Language: English Cognitive Barrier Assessment/Beliefs Language Barrier: No Translator Needed: No Memory Deficit: No Emotional Barrier: No Cultural/Religious Beliefs Affecting Medical No Care: Physical Barrier Assessment Impaired Vision: No Impaired Hearing: No Decreased Hand dexterity: No Knowledge/Comprehension Assessment Knowledge Level: Low Comprehension Level: Low Ability to understand written Low instructions: Ability to understand verbal Low instructions: Motivation Assessment Anxiety Level: Calm Cooperation: Cooperative Education Importance: Acknowledges Need Interest in Health Problems: Uninterested Perception: Confused Willingness to Engage in Self- Low Management Activities: Readiness to Engage in Self- Low Management Activities: Electronic Signature(s) [name] [name] [name] ([id number]) Signed: 12/30/2016 4:48:20 PM By: Curtis Sites Entered By: Curtis Sites on 12/30/2016 08:22:16 [name] [name] [name] ([id number]) -------------------------------------------------------------------------------- Fall Risk Assessment  Details Patient Name: [name] [name] [name] Date of Service: 12/30/2016 8:00 AM Medical Record Number: [id number] Patient Account Number: [id number] Date of Birth/Sex: [birth year] (83 y.o. Male) Treating RN: Curtis Sites Primary Care Kerrie Latour: Darreld Mclean Other Clinician: Referring Gina Leblond: Darreld Mclean Treating Adaia Matthies/Extender: Altamese [place] in Treatment: 0 Fall Risk Assessment Items Have you had 2 or more falls in the last 12 monthso 0 No Have you had any fall that resulted in injury in the last 12 monthso 0 No FALL RISK ASSESSMENT: History of falling - immediate or within 3 months 0 No Secondary diagnosis 0 No Ambulatory aid None/bed rest/wheelchair/nurse 0 Yes Crutches/cane/walker 0 No Furniture 0 No IV Access/Saline  Lock 0 No Gait/Training Normal/bed rest/immobile 0 No Weak 10 Yes Impaired 0 No Mental Status Oriented to own ability 0 Yes Electronic Signature(s) Signed: 12/30/2016 4:48:20 PM By: Curtis Sitesorthy, [name] Entered By: Curtis Sitesorthy, [name] on 12/30/2016 08:22:29 [name] [name] [name] ([id number]) -------------------------------------------------------------------------------- Nutrition Risk Assessment Details Patient Name: [name] [name] [name] Date of Service: 12/30/2016 8:00 AM Medical Record Number: [id number] Patient Account Number: [id number] Date of Birth/Sex: [birth year](83 y.o. Male) Treating RN: Curtis Sitesorthy, [name] Primary Care Lorin Gawron: Darreld McleanMILES, [name] Other Clinician: Referring Elexus Barman: Darreld McleanMILES, [name] Treating Angelice Piech/Extender: Maxwell CaulOBSON, MICHAEL [name] Weeks in Treatment: 0 Height (in): Weight (lbs): Body Mass Index (BMI): Nutrition Risk Assessment Items NUTRITION RISK SCREEN: I have an illness or condition that made me change the kind and/or 0 No amount of food I eat I eat fewer than two meals per day 0 No I eat few fruits and vegetables, or milk products 0 No I have three or more drinks of beer, liquor or wine almost every day 0 No I have tooth or mouth problems that make it  hard for me to eat 0 No I don't always have enough money to buy the food I need 0 No I eat alone most of the time 0 No I take three or more different prescribed or over-the-counter drugs a 1 Yes day Without wanting to, I have lost or gained 10 pounds in the last six 0 No months I am not always physically able to shop, cook and/or feed myself 0 No Nutrition Protocols Good Risk Protocol 0 No interventions needed Moderate Risk Protocol Electronic Signature(s) Signed: 12/30/2016 4:48:20 PM By: Curtis Sitesorthy, [name] Entered By: Curtis Sitesorthy, [name] on 12/30/2016 08:22:36

## 2016-12-31 NOTE — Progress Notes (Signed)
[place], [place] ([id number]) Visit Report for 12/30/2016 Chief Complaint Document Details Patient Name: PRIDEBabak, Lucus Date of Service: 12/30/2016 8:00 AM Medical Record Number: [id number] Patient Account Number: [id number] Date of Birth/Sex: 1934/06/09 (83 y.o. Male) Treating RN: Curtis Sites Primary Care Provider: Darreld Mclean Other Clinician: Referring Provider: Darreld Mclean Treating Provider/Extender: Maxwell Caul Weeks in Treatment: 0 Information Obtained from: Patient Chief Complaint 12/30/16; patient arrives from [facility] unaccompanied. He has 3 open wounds on the right foot and 2 threatened areas Electronic Signature(s) Signed: 12/30/2016 4:49:17 PM By: Baltazar Najjar MD Entered By: Baltazar Najjar on 12/30/2016 09:31:23 B[place]age, [name] ([id number]) -------------------------------------------------------------------------------- HPI Details Patient Name: [name] Date of Service: 12/30/2016 8:00 AM Medical Record Number: [id number] Patient Account Number: [id number] Date of Birth/Sex: [birth year] (83 y.o. Male) Treating RN: Curtis Sites Primary Care Provider: Darreld Mclean Other Clinician: Referring Provider: Darreld Mclean Treating Provider/Extender: Altamese [place] in Treatment: 0 History of Present Illness HPI Description: 12/30/16; this is an 83 year old man who comes from [facility] skilled facility. He comes today unaccompanied and without any history. Looking through [facility] link I am able to see that he was admitted to hospital from 5/25 through 12/04/16 at that point with dehydration, severe hyponatremia acute kidney injury. He was treated with IV fluid replacement including 3% normal saline. Nowhere in his discharge summary did I see any reference to wounds on his feet and is mentioned he does not come in with any history. He does come in with a pack of cigarettes therefore I'm assuming he is a smoker. He is not a listed diabetic.  There is some reference in his notes that he has a DSS Child psychotherapist is his guardian I'm not completely sure if that is true in any case the patient has some degree of dementia although he is able to tell me that he is 83 years old came from [place]. He is not able to give a history of these wounds. Our intake nurse notes maggots in the right third toe. Beyond this he has an area over the right lateral malleolus on the right lateral foot. He also has to threatened areas on the base of the right fifth metatarsal and on the lateral aspect of the right fifth metatarsal head. We are completely unable to do ABIs in the clinic largely because when we inflated the cuff he seemed to withdraw the foot. Electronic Signature(s) Signed: 12/30/2016 4:49:17 PM By: Baltazar Najjar MD Entered By: Baltazar Najjar on 12/30/2016 09:34:26 Daman, [name] ([id number]) -------------------------------------------------------------------------------- Physical Exam Details Patient Name: [name] Date of Service: 12/30/2016 8:00 AM Medical Record Number: [id number] Patient Account Number: [id number] Date of Birth/Sex: [birth year] (83 y.o. Male) Treating RN: Curtis Sites Primary Care Provider: Darreld Mclean Other Clinician: Referring Provider: Darreld Mclean Treating Provider/Extender: Maxwell Caul Weeks in Treatment: 0 Constitutional Sitting or standing Blood Pressure is within target range for patient.. Pulse regular and within target range for patient.Marland Kitchen Respirations regular, non-labored and within target range.. Temperature is normal and within the target range for the patient.. Somewhat frail-appearing man. He is awake and can answer questions. He is restless. Eyes Conjunctivae clear. No discharge. Ears, Nose, Mouth, and Throat He is edentulous. Neck Neck supple and symmetrical. No masses or crepitus. Respiratory Respiratory effort is easy and symmetric bilaterally. Rate is normal at rest and  on room air.. Bilateral breath sounds are clear and equal in all lobes with no wheezes, rales or rhonchi.. Cardiovascular His femoral pulses were palpable but not  his popliteal. Pedal pulses absent bilaterally.. Gastrointestinal (GI) Abdomen is soft and non-distended without masses or tenderness. Bowel sounds active in all quadrants.. No liver or spleen enlargement or tenderness.. Lymphatic Nonpalpable no popliteal, inguinal, cervical. Musculoskeletal Flexion contractures of the left hip left knee and left ankle. He seems to withdraw the right leg but he is able to extend it.. Integumentary (Hair, Skin) No systemic rash. Psychiatric The patient is awake and able to answer questions. As mentioned he said he was 82. He could give no reliable history of the wounds. Suspect some degree of cognitive impairment. Notes Wound exam; the patient has 2 substantial wounds 1 over the right lateral malleolus and one over the right lateral foot using full-thickness wounds with muscle exposed. He also has a necrotic and painful tip to the right third toe especially laterally. There is no obvious involvement of the DIP or PIP joint but this is obviously very painful. The patient also has to threatened areas over the base of the right fifth metatarsal Cerrone, [name] ([id number]) and over the fifth metatarsal head itself laterally and posteriorly. The latter of these 2 areas I think are going to have opening soon especially over the fifth metatarsal head Electronic Signature(s) Signed: 12/30/2016 4:49:17 PM By: Baltazar Najjar MD Entered By: Baltazar Najjar on 12/30/2016 09:38:30 Espejo, [name] ([id number]) -------------------------------------------------------------------------------- Physician Orders Details Patient Name: [name] Date of Service: 12/30/2016 8:00 AM Medical Record Number: [id number] Patient Account Number: [id number] Date of Birth/Sex: Jan 10, 1934 (83 y.o. Male) Treating RN:  Curtis Sites Primary Care Provider: Darreld Mclean Other Clinician: Referring Provider: Darreld Mclean Treating Provider/Extender: Altamese [place] in Treatment: 0 Verbal / Phone Orders: Yes Clinician: Curtis Sites Read Back and Verified: Yes Diagnosis Coding Wound Cleansing Wound #1 Right,Lateral Ankle o Clean wound with Normal Saline. o May Shower, gently pat wound dry prior to applying new dressing. Wound #2 Right Malleolus o Clean wound with Normal Saline. o May Shower, gently pat wound dry prior to applying new dressing. Wound #3 Right Toe Third o Clean wound with Normal Saline. o May Shower, gently pat wound dry prior to applying new dressing. Anesthetic Wound #1 Right,Lateral Ankle o Topical Lidocaine 4% cream applied to wound bed prior to debridement Wound #2 Right Malleolus o Topical Lidocaine 4% cream applied to wound bed prior to debridement Wound #3 Right Toe Third o Topical Lidocaine 4% cream applied to wound bed prior to debridement Primary Wound Dressing Wound #1 Right,Lateral Ankle o Aquacel Ag Wound #2 Right Malleolus o Aquacel Ag Wound #3 Right Toe Third o Aquacel Ag Secondary Dressing Wound #1 Right,Lateral Ankle o Gauze and Kerlix/Conform [place], [name] ([id number]) Wound #2 Right Malleolus o Gauze and Kerlix/Conform Wound #3 Right Toe Third o Gauze and Kerlix/Conform Dressing Change Frequency Wound #1 Right,Lateral Ankle o Change dressing every day. Wound #2 Right Malleolus o Change dressing every day. Wound #3 Right Toe Third o Change dressing every day. Follow-up Appointments Wound #1 Right,Lateral Ankle o Return Appointment in 1 week. Wound #2 Right Malleolus o Return Appointment in 1 week. Wound #3 Right Toe Third o Return Appointment in 1 week. Additional Orders / Instructions Wound #1 Right,Lateral Ankle o Stop Smoking o Increase protein intake. Wound #2 Right Malleolus o Stop  Smoking o Increase protein intake. Wound #3 Right Toe Third o Stop Smoking o Increase protein intake. Laboratory o Bacteria identified in Wound by Culture (MICRO) - right third toe oooo LOINC Code: 6462-6 oooo Convenience Name: Wound culture routine [place], [place] ([id number])  Radiology o X-ray, foot - mobile xray at facility and fax results to [phone] - r/o osteomyelitis in 3rd toe Services and Therapies o Arterial Studies- Bilateral Electronic Signature(s) Signed: 12/30/2016 4:48:20 PM By: Curtis Sites Signed: 12/30/2016 4:49:17 PM By: Baltazar Najjar MD Entered By: Curtis Sites on 12/30/2016 09:35:29 Garro, [name] ([id number]) -------------------------------------------------------------------------------- Problem List Details Patient Name: Fettes, [name] Date of Service: 12/30/2016 8:00 AM Medical Record Number: [id number] Patient Account Number: [id number] Date of Birth/Sex: December 30, 1933 (83 y.o. 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/30/2016 Yes ulceration of other part of foot I70.245 Atherosclerosis of native arteries of left leg with ulceration 12/30/2016 Yes of other part of foot Inactive Problems Resolved Problems Electronic Signature(s) Signed: 12/30/2016 4:49:17 PM By: Baltazar Najjar MD Entered By: Baltazar Najjar on 12/30/2016 09:30:34 [place], [name]  ([id number]) -------------------------------------------------------------------------------- Progress Note Details Patient Name: [name] Date of Service: 12/30/2016 8:00 AM Medical Record Number: [id number] Patient Account Number: [id number] Date of Birth/Sex: [birth year] (83 y.o. Male) Treating RN: Curtis Sites Primary Care Provider: Darreld Mclean Other Clinician: Referring Provider: Darreld Mclean Treating Provider/Extender: Maxwell Caul Weeks in Treatment: 0 Subjective Chief Complaint Information obtained from Patient 12/30/16; patient arrives from [facility] unaccompanied. He has 3 open wounds on the right foot and 2 threatened areas History of Present Illness (HPI) 12/30/16; this is an 83 year old man who comes from [facility] skilled facility. He comes today unaccompanied and without any history. Looking through [facility] link I am able to see that he was admitted to hospital from 5/25 through 12/04/16 at that point with dehydration, severe hyponatremia acute kidney injury. He was treated with IV fluid replacement including 3% normal saline. Nowhere in his discharge summary did I see any reference to wounds on his feet and is mentioned he does not come in with any history. He does come in with a pack of cigarettes therefore I'm assuming he is a smoker. He is not a listed diabetic. There is some reference in his notes that he has a DSS Child psychotherapist is his guardian I'm not completely sure if that is true in any case the patient has some degree of dementia although he is able to tell me that he is 83 years old came from [place]. He is not able to give a history of these wounds. Our intake nurse notes maggots in the right third toe. Beyond this he has an area over the right lateral malleolus on the right lateral foot. He also has to threatened areas on the base of the right fifth metatarsal and on the lateral aspect of the right fifth metatarsal  head. We are completely unable to do ABIs in the clinic largely because when we inflated the cuff he seemed to withdraw the foot. Wound History Patient presents with 2 open wounds that have been present for approximately 7 weeks. Patient has been treating wounds in the following manner: wet to dry dressings. Laboratory tests have not been performed in the last month. Patient reportedly has not tested  positive for an antibiotic resistant organism. Patient reportedly has not tested positive for osteomyelitis. Patient reportedly has not had testing performed to evaluate circulation in the legs. Patient History Information obtained from Patient. Allergies hydrochlorothiazide, lisinopril [place], [place] ([id number]) Social History Current every day smoker, Marital Status - Widowed, Alcohol Use - Never, Drug Use - No History, Caffeine Use - Daily. Medical History Cardiovascular Patient has history of Hypertension, Peripheral Venous Disease Musculoskeletal Patient has history of Gout, Osteoarthritis Medical And Surgical History Notes Cardiovascular CVS with left sided weakness Musculoskeletal muscle weakness Review of Systems (ROS) Constitutional Symptoms (General Health) The patient has no complaints or symptoms. Eyes Complains or has symptoms of Glasses / Contacts - glasses. Ear/Nose/Mouth/Throat The patient has no complaints or symptoms. Respiratory The patient has no complaints or symptoms. Gastrointestinal The patient has no complaints or symptoms. Endocrine The patient has no complaints or symptoms. Genitourinary The patient has no complaints or symptoms. Immunological The patient has no complaints or symptoms. Integumentary (Skin) The patient has no complaints or symptoms. Musculoskeletal The patient has no complaints or symptoms. Neurologic The patient has no complaints or symptoms. Oncologic The patient has no complaints or symptoms. Psychiatric The patient has no  complaints or symptoms. General Notes: guardian Blain PaisMegan Tyson - DSS Torrence, [place] ([id number]) Objective Constitutional Sitting or standing Blood Pressure is within target range for patient.. Pulse regular and within target range for patient.Marland Kitchen. Respirations regular, non-labored and within target range.. Temperature is normal and within the target range for the patient.. Somewhat frail-appearing man. He is awake and can answer questions. He is restless. Vitals Time Taken: 8:25 AM, Temperature: 97.5 F, Pulse: 76 bpm, Respiratory Rate: 18 breaths/min, Blood Pressure: 143/71 mmHg. Eyes Conjunctivae clear. No discharge. Ears, Nose, Mouth, and Throat He is edentulous. Neck Neck supple and symmetrical. No masses or crepitus. Respiratory Respiratory effort is easy and symmetric bilaterally. Rate is normal at rest and on room air.. Bilateral breath sounds are clear and equal in all lobes with no wheezes, rales or rhonchi.. Cardiovascular His femoral pulses were palpable but not his popliteal. Pedal pulses absent bilaterally.. Gastrointestinal (GI) Abdomen is soft and non-distended without masses or tenderness. Bowel sounds active in all quadrants.. No liver or spleen enlargement or tenderness.. Lymphatic Nonpalpable no popliteal, inguinal, cervical. Musculoskeletal Flexion contractures of the left hip left knee and left ankle. He seems to withdraw the right leg but he is able to extend it.Marland Kitchen. Psychiatric The patient is awake and able to answer questions. As mentioned he said he was 82. He could give no reliable history of the wounds. Suspect some degree of cognitive impairment. General Notes: Wound exam; the patient has 2 substantial wounds 1 over the right lateral malleolus and one over the right lateral foot using full-thickness wounds with muscle exposed. He also has a necrotic and painful tip to the right third toe especially laterally. There is no obvious involvement of the DIP or PIP  joint [place]son, [place] ([id number]) but this is obviously very painful. The patient also has to threatened areas over the base of the right fifth metatarsal and over the fifth metatarsal head itself laterally and posteriorly. The latter of these 2 areas I think are going to have opening soon especially over the fifth metatarsal head Integumentary (Hair, Skin) No systemic rash. Wound #1 status is Open. Original cause of wound was Gradually Appeared. The wound is located on the Right,Lateral Ankle. The wound measures 2.6cm length x 2.8cm width x 0.3cm depth; 5.718cm^2 area and 1.715cm^3 volume.  There is Fat Layer (Subcutaneous Tissue) Exposed exposed. There is no tunneling or undermining noted. There is a large amount of serous drainage noted. The wound margin is flat and intact. There is medium (34-66%) red granulation within the wound bed. There is a medium (34-66%) amount of necrotic tissue within the wound bed including Adherent Slough. The periwound skin appearance did not exhibit: Callus, Crepitus, Excoriation, Induration, Rash, Scarring, Dry/Scaly, Maceration, Atrophie Blanche, Cyanosis, Ecchymosis, Hemosiderin Staining, Mottled, Pallor, Rubor, Erythema. Periwound temperature was noted as No Abnormality. The periwound has tenderness on palpation. Wound #2 status is Open. Original cause of wound was Gradually Appeared. The wound is located on the Right Malleolus. The wound measures 2.5cm length x 2.6cm width x 0.2cm depth; 5.105cm^2 area and 1.021cm^3 volume. There is Fat Layer (Subcutaneous Tissue) Exposed exposed. There is no tunneling or undermining noted. There is a large amount of serous drainage noted. The wound margin is flat and intact. There is medium (34-66%) red granulation within the wound bed. There is a medium (34-66%) amount of necrotic tissue within the wound bed including Adherent Slough. The periwound skin appearance did not exhibit: Callus, Crepitus, Excoriation, Induration,  Rash, Scarring, Dry/Scaly, Maceration, Atrophie Blanche, Cyanosis, Ecchymosis, Hemosiderin Staining, Mottled, Pallor, Rubor, Erythema. Periwound temperature was noted as No Abnormality. The periwound has tenderness on palpation. Wound #3 status is Open. Original cause of wound was Gradually Appeared. The wound is located on the Right Toe Third. The wound measures 2.3cm length x 2cm width x 0.2cm depth; 3.613cm^2 area and 0.723cm^3 volume. There is no tunneling or undermining noted. There is a large amount of purulent drainage noted. The wound margin is flat and intact. There is no granulation within the wound bed. There is a large (67-100%) amount of necrotic tissue within the wound bed including Eschar and Adherent Slough. The periwound skin appearance did not exhibit: Callus, Crepitus, Excoriation, Induration, Rash, Scarring, Dry/Scaly, Maceration, Atrophie Blanche, Cyanosis, Ecchymosis, Hemosiderin Staining, Mottled, Pallor, Rubor, Erythema. Periwound temperature was noted as No Abnormality. The periwound has tenderness on palpation. 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 May Shower, gently pat wound dry prior to applying new dressing. Wound #2 Right Malleolus: Clean wound with Normal Saline. May Shower, gently pat  wound dry prior to applying new dressing. Wound #3 Right Toe Third: Clean wound with Normal Saline. May Shower, gently pat wound dry prior to applying new dressing. Anesthetic: Wound #1 Right,Lateral Ankle: Topical Lidocaine 4% cream applied to wound bed prior to debridement Wound #2 Right Malleolus: Topical Lidocaine 4% cream applied to wound bed prior to debridement Wound #3 Right Toe Third: Topical Lidocaine 4% cream applied to wound bed prior to debridement Primary Wound Dressing: Wound #1 Right,Lateral Ankle: Aquacel Ag Wound #2 Right Malleolus: Aquacel Ag Wound #3 Right Toe Third: Aquacel Ag Secondary Dressing: Wound #1 Right,Lateral Ankle: Gauze and Kerlix/Conform Wound #  2 Right Malleolus: Gauze and Kerlix/Conform Wound #3 Right Toe Third: Gauze and Kerlix/Conform Dressing Change Frequency: Wound #1 Right,Lateral Ankle: Change dressing every day. Wound #2 Right Malleolus: Change dressing every day. Wound #3 Right Toe Third: Change dressing every day. Follow-up Appointments: Wound #1 Right,Lateral Ankle: [place], [name] ([id number]) Return Appointment in 1 week. Wound #2 Right Malleolus: Return Appointment in 1 week. Wound #3 Right Toe Third: Return Appointment in 1 week. Additional Orders / Instructions: Wound #1 Right,Lateral Ankle: Stop Smoking Increase protein intake. Wound #2 Right Malleolus: Stop Smoking Increase protein intake. Wound #3 Right Toe Third: Stop Smoking Increase protein intake. Radiology ordered were: X-ray, foot - mobile xray at facility and fax results to [phone] - r/o osteomyelitis in 3rd toe Services and Therapies ordered were: Arterial Studies- Bilateral Laboratory ordered were: Wound culture routine - right third toe #1 although I have no clear history on these areas I suspect these are ischemic wounds in this chronic smoker. #2 we had some discussion about the best way to get him properly evaluated for his arterial supply  and about the only thing we could really come up with with sending him to vein and vascular for a consultation. I think this is well on its way to possible critical limb ischemia. #3 he has a necrotic wound on the tip of the third toe with maggots when he came into the clinic. The nurse wash these off. I can't exclude coexistent cellulitis in this area and I have asked for an x-ray and suggested empiric doxycycline. #4 the patient was in the hospital at the end of May and there was no reference to these wounds therefore I'm assuming they are relatively acute although that's an assumption #5 the patient has a DSS Child psychotherapist apparently. We may need to soon involved this person as it is possible he is likely to need an angiogram #6 I ordered silver alginate all these wound areas with a Kerlix dressing. Electronic Signature(s) Signed: 12/30/2016 4:49:17 PM By: Baltazar Najjar MD Caldera, [place] ([id number]) Entered By: Baltazar Najjar on 12/30/2016 09:42:25 Hershey, [name] ([id number]) -------------------------------------------------------------------------------- ROS/PFSH Details Patient Name: [name] Date of Service: 12/30/2016 8:00 AM Medical Record Number: [id number] Patient Account Number: [id number] Date of Birth/Sex: 1933/08/15 (83 y.o. Male) Treating RN: Curtis Sites Primary Care Provider: Darreld Mclean Other Clinician: Referring Provider: Darreld Mclean Treating Provider/Extender: Maxwell Caul Weeks in Treatment: 0 Information Obtained From Patient Wound History Do you currently have one or more open woundso Yes How many open wounds do you currently haveo 2 Approximately how long have you had your woundso 7 weeks How have you been treating your wound(s) until nowo wet to dry dressings Has your wound(s) ever healed and then re-openedo No Have you had any lab work done in the past montho No Have you tested positive for an antibiotic resistant organism (MRSA, VRE)o  No Have you tested positive for osteomyelitis (bone infection)o No Have you had any tests for circulation on your legso No Eyes Complaints and Symptoms: Positive for: Glasses / Contacts - glasses Constitutional Symptoms (General Health) Complaints and Symptoms: No Complaints or Symptoms Ear/Nose/Mouth/Throat Complaints and Symptoms: No Complaints or Symptoms Respiratory Complaints and Symptoms: No Complaints or Symptoms Cardiovascular Medical History: Positive for: Hypertension; Peripheral Venous Disease Past Medical History Notes: CVS with left sided weakness Gastrointestinal Dauphinais, [name] ([id number]) Complaints and Symptoms: No Complaints or Symptoms Endocrine Complaints and Symptoms: No Complaints or Symptoms Genitourinary Complaints and Symptoms: No Complaints or Symptoms Immunological Complaints  and Symptoms: No Complaints or Symptoms Integumentary (Skin) Complaints and Symptoms: No Complaints or Symptoms Musculoskeletal Complaints and Symptoms: No Complaints or Symptoms Medical History: Positive for: Gout; Osteoarthritis Past Medical History Notes: muscle weakness Neurologic Complaints and Symptoms: No Complaints or Symptoms Oncologic Complaints and Symptoms: No Complaints or Symptoms Psychiatric Complaints and Symptoms: No Complaints or Symptoms Immunizations Pneumococcal Vaccine: Received Pneumococcal Vaccination: Yes Sirois, [name] ([id number]) Immunization Notes: up to date Family and Social History Current every day smoker; Marital Status - Widowed; Alcohol Use: Never; Drug Use: No History; Caffeine Use: Daily; Financial Concerns: No; Food, Clothing or Shelter Needs: No; Support System Lacking: No; Transportation Concerns: No; Advanced Directives: No; Patient does not want information on Advanced Directives Notes guardian Blain Pais - DSS Electronic Signature(s) Signed: 12/30/2016 4:48:20 PM By: Curtis Sites Signed: 12/30/2016 4:49:17 PM  By: Baltazar Najjar MD Entered By: Curtis Sites on 12/30/2016 08:28:15 Hodzic, [name] ([id number]) -------------------------------------------------------------------------------- SuperBill Details Patient Name: Kwiecinski, [name] Date of Service: 12/30/2016 Medical Record Number: [id number] Patient Account Number: [id number] Date of Birth/Sex: Nov 26, 1933 (83 y.o. 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 Facility Procedures CPT4 Code: 08657846 Description: 99214 - WOUND CARE VISIT-LEV 4 EST PT Modifier: Quantity: 1 Physician Procedures CPT4: Description Modifier Quantity Code 9629528 99204 - WC PHYS LEVEL 4 - NEW PT 1 ICD-10 Description Diagnosis L97.311 Non-pressure chronic ulcer of right ankle limited to breakdown of skin L97.513 Non-pressure chronic ulcer of other part of right foot  with necrosis of muscle I70.235 Atherosclerosis of native arteries of right leg with ulceration of other part of foot Electronic Signature(s) Signed: 12/31/2016 9:47:01 AM By: Curtis Sites Previous Signature: 12/30/2016 4:49:17 PM Version By: Baltazar Najjar MD Entered By: Curtis Sites on 12/31/2016 09:47:00

## 2017-01-01 ENCOUNTER — Ambulatory Visit (INDEPENDENT_AMBULATORY_CARE_PROVIDER_SITE_OTHER): Payer: Medicare HMO | Admitting: Vascular Surgery

## 2017-01-01 ENCOUNTER — Encounter (INDEPENDENT_AMBULATORY_CARE_PROVIDER_SITE_OTHER): Payer: Self-pay | Admitting: Vascular Surgery

## 2017-01-01 ENCOUNTER — Encounter (INDEPENDENT_AMBULATORY_CARE_PROVIDER_SITE_OTHER): Payer: Self-pay

## 2017-01-01 VITALS — BP 128/64 | HR 83 | Resp 16

## 2017-01-01 DIAGNOSIS — L97913 Non-pressure chronic ulcer of unspecified part of right lower leg with necrosis of muscle: Secondary | ICD-10-CM | POA: Diagnosis not present

## 2017-01-01 DIAGNOSIS — I10 Essential (primary) hypertension: Secondary | ICD-10-CM

## 2017-01-01 DIAGNOSIS — I7025 Atherosclerosis of native arteries of other extremities with ulceration: Secondary | ICD-10-CM

## 2017-01-01 DIAGNOSIS — J449 Chronic obstructive pulmonary disease, unspecified: Secondary | ICD-10-CM | POA: Diagnosis not present

## 2017-01-01 DIAGNOSIS — E785 Hyperlipidemia, unspecified: Secondary | ICD-10-CM

## 2017-01-01 LAB — AEROBIC CULTURE W GRAM STAIN (SUPERFICIAL SPECIMEN)

## 2017-01-01 LAB — AEROBIC CULTURE  (SUPERFICIAL SPECIMEN)

## 2017-01-04 DIAGNOSIS — J449 Chronic obstructive pulmonary disease, unspecified: Secondary | ICD-10-CM | POA: Insufficient documentation

## 2017-01-04 DIAGNOSIS — I7025 Atherosclerosis of native arteries of other extremities with ulceration: Secondary | ICD-10-CM | POA: Insufficient documentation

## 2017-01-04 DIAGNOSIS — I10 Essential (primary) hypertension: Secondary | ICD-10-CM | POA: Insufficient documentation

## 2017-01-04 DIAGNOSIS — L97913 Non-pressure chronic ulcer of unspecified part of right lower leg with necrosis of muscle: Secondary | ICD-10-CM | POA: Insufficient documentation

## 2017-01-04 NOTE — Progress Notes (Signed)
MRN : [id number]  [name]. is a 83 y.o. (1934/03/19) male who presents with chief complaint of  Chief Complaint  Patient presents with  . New Evaluation    EST Pat-Toe discoloration  .  History of Present Illness: The patient is seen for evaluation of painful lower extremities and diminished pulses associated with ulceration of the right foot.  The patient notes the ulcer has been present for multiple weeks and has not been improving.  It is very painful and has had some drainage.  No specific history of trauma noted by the patient.  The patient denies fever or chills.  the patient does have diabetes which has been difficult to control.  Patient notes prior to the ulcer developing the extremities were painful particularly with ambulation or activity and the discomfort is very consistent day today. Typically, the pain occurs at less than one block, progress is as activity continues to the point that the patient must stop walking. Resting including standing still for several minutes allowed resumption of the activity and the ability to walk a similar distance before stopping again. Uneven terrain and inclined shorten the distance. The pain has been progressive over the past several years.   The patient denies rest pain or dangling of an extremity off the side of the bed during the night for relief. No prior interventions or surgeries.  No history of back problems or DJD of the lumbar sacral spine.   The patient denies amaurosis fugax or recent TIA symptoms. There are no recent neurological changes noted. The patient denies history of DVT, PE or superficial thrombophlebitis. The patient denies recent episodes of angina or shortness of breath.  Current Meds  Medication Sig  . acetaminophen (TYLENOL) 325 MG tablet Take 650 mg by mouth every 6 (six) hours as needed for moderate pain.  Marland Kitchen albuterol (PROVENTIL HFA;VENTOLIN HFA) 108 (90 Base) MCG/ACT inhaler Inhale 2 puffs into the lungs  every 6 (six) hours as needed for wheezing or shortness of breath.  Marland Kitchen amLODipine (NORVASC) 5 MG tablet Take 5 mg by mouth daily.  Marland Kitchen aspirin EC 81 MG tablet Take 81 mg by mouth daily.  . clopidogrel (PLAVIX) 75 MG tablet Take 1 tablet (75 mg total) by mouth daily.  Marland Kitchen docusate sodium (COLACE) 100 MG capsule Take 100 mg by mouth 2 (two) times daily.  . febuxostat (ULORIC) 40 MG tablet Take 40 mg by mouth daily.  . feeding supplement, ENSURE ENLIVE, (ENSURE ENLIVE) LIQD Take 237 mLs by mouth 3 (three) times daily between meals.  . hypromellose (GENTEAL SEVERE) 0.3 % GEL ophthalmic ointment Place 1 application into the left eye as needed (irritation/watering).   Marland Kitchen ipratropium (ATROVENT) 0.03 % nasal spray Place 1 spray into both nostrils every 4 (four) hours as needed (congestion).   Marland Kitchen loratadine (CLARITIN) 10 MG tablet Take 10 mg by mouth every evening.   . lovastatin (MEVACOR) 20 MG tablet Take 20 mg by mouth every evening.  . meloxicam (MOBIC) 7.5 MG tablet Take 7.5 mg by mouth every evening.   . tamsulosin (FLOMAX) 0.4 MG CAPS capsule Take 1 capsule (0.4 mg total) by mouth daily.    Past Medical History:  Diagnosis Date  . Arthritis   . Gout   . Hypertension   . Stroke [facility])    left sided weakness    Past Surgical History:  Procedure Laterality Date  . INGUINAL HERNIA REPAIR Right 09/21/2015   Procedure: HERNIA REPAIR INGUINAL ADULT;  Surgeon: Jason Nest  Renda Rolls, MD;  Location: [facility] ORS;  Service: General;  Laterality: Right;  . LOWER EXTREMITY ANGIOGRAPHY Left 09/30/2016   Procedure: Lower Extremity Angiography;  Surgeon: [name], MD;  Location: [facility] INVASIVE CV LAB;  Service: Cardiovascular;  Laterality: Left;    Social History Social History  Substance Use Topics  . Smoking status: Current Every Day Smoker    Packs/day: 1.00    Years: 50.00    Types: Cigarettes  . Smokeless tobacco: Never Used  . Alcohol use No     Comment: previous heavy drinker now 1x month     Family History Family History  Problem Relation Age of Onset  . Kidney disease Neg Hx   . Prostate cancer Neg Hx   No family history of bleeding/clotting disorders, porphyria or autoimmune disease   Allergies  Allergen Reactions  . Hydrochlorothiazide     Documented on MAR  . Lisinopril     Documented on MAR     REVIEW OF SYSTEMS (Negative unless checked)  Constitutional: [] Weight loss  [] Fever  [] Chills Cardiac: [] Chest pain   [] Chest pressure   [] Palpitations   [] Shortness of breath when laying flat   [] Shortness of breath with exertion. Vascular:  [] Pain in legs with walking   [x] Pain in legs at rest  [] History of DVT   [] Phlebitis   [] Swelling in legs   [] Varicose veins   [x] Non-healing ulcers Pulmonary:   [] Uses home oxygen   [] Productive cough   [] Hemoptysis   [] Wheeze  [x] COPD   [] Asthma Neurologic:  [] Dizziness   [] Seizures   [] History of stroke   [] History of TIA  [] Aphasia   [] Vissual changes   [] Weakness or numbness in arm   [] Weakness or numbness in leg Musculoskeletal:   [] Joint swelling   [x] Joint pain   [] Low back pain Hematologic:  [] Easy bruising  [] Easy bleeding   [] Hypercoagulable state   [] Anemic Gastrointestinal:  [] Diarrhea   [] Vomiting  [] Gastroesophageal reflux/heartburn   [] Difficulty swallowing. Genitourinary:  [x] Chronic kidney disease   [] Difficult urination  [] Frequent urination   [] Blood in urine Skin:  [] Rashes   [x] Ulcers  Psychological:  [] History of anxiety   []  History of major depression.  Physical Examination  Vitals:   01/01/17 1119  BP: 128/64  Pulse: 83  Resp: 16   There is no height or weight on file to calculate BMI. Gen: WD/WN, moderate distress, debilitated Head: [place]/AT, + temporalis wasting.  Ear/Nose/Throat: Hearing grossly intact, nares w/o erythema or drainage, poor dentition Eyes: PER, EOMI, sclera nonicteric.  Neck: Supple, no masses.  No bruit or JVD.  Pulmonary:  Good air movement, clear to auscultation bilaterally,  no use of accessory muscles.  Cardiac: RRR, normal S1, S2, no Murmurs. Vascular: ulceration right heel with fat necrosis Vessel Right Left  Radial Palpable Palpable  Brachial Palpable Palpable  Femoral Palpable Palpable  Popliteal Palpable Palpable  PT Palpable Palpable  DP Palpable Palpable   Gastrointestinal: soft, non-distended. No guarding/no peritoneal signs.  Musculoskeletal: M/S 5/5 throughout arms but 3/5 in the legs.  Severe deformity with atrophy; patient presents in wheelchair and is nonambulatory.  Neurologic: CN 2-12 intact. Pain and light touch intact in extremities.  Symmetrical.  Speech is fluent. Motor exam as listed above. Psychiatric: Judgment intact, Mood & affect appropriate for pt's clinical situation. Dermatologic: + ulcers noted.  No changes consistent with cellulitis. Lymph : No Cervical lymphadenopathy, no lichenification or skin changes of chronic lymphedema.  CBC Lab Results  Component Value Date  WBC 4.4 12/02/2016   HGB 12.9 (L) 12/02/2016   HCT 37.8 (L) 12/02/2016   MCV 87.6 12/02/2016   PLT 425 12/02/2016    BMET    Component Value Date/Time   NA 134 (L) 12/04/2016 0427   NA 135 (L) 01/03/2014 1959   K 4.8 12/04/2016 0427   K 4.5 01/03/2014 1959   CL 101 12/04/2016 0427   CL 98 01/03/2014 1959   CO2 26 12/04/2016 0427   CO2 28 01/03/2014 1959   GLUCOSE 112 (H) 12/04/2016 0427   GLUCOSE 84 01/03/2014 1959   BUN 33 (H) 12/04/2016 0427   BUN 30 (H) 01/03/2014 1959   CREATININE 0.95 12/04/2016 0427   CREATININE 1.20 01/03/2014 1959   CALCIUM 9.5 12/04/2016 0427   CALCIUM 9.8 01/03/2014 1959   GFRNONAA >60 12/04/2016 0427   GFRNONAA 58 (L) 01/03/2014 1959   GFRAA >60 12/04/2016 0427   GFRAA >60 01/03/2014 1959   CrCl cannot be calculated (Patient's most recent lab result is older than the maximum 21 days allowed.).  COAG Lab Results  Component Value Date   INR 1.03 09/11/2015    Radiology No results found.  Assessment/Plan 1.  Atherosclerosis of native arteries of the extremities with ulceration (HCC)  Recommend:  The patient has evidence of severe atherosclerotic changes of both lower extremities associated with ulceration and tissue loss of the Right foot.  This represents a limb threatening ischemia and places the patient at the risk for limb loss.  Patient should undergo angiography of the lower extremities with the hope for intervention for limb salvage.  The risks and benefits as well as the alternative therapies was discussed in detail with the patient.  All questions were answered.  Patient agrees to proceed with angiography.  The patient will follow up with me in the office after the procedure.    2. Chronic neurogenic ulcer of leg, right, with necrosis of muscle (HCC) See #1  3. Hyperlipidemia, unspecified hyperlipidemia type Continue statin as ordered and reviewed, no changes at this time   4. Essential hypertension Continue antihypertensive medications as already ordered, these medications have been reviewed and there are no changes at this time.   5. Chronic obstructive pulmonary disease, unspecified COPD type (HCC) Continue pulmonary medications and aerosols as already ordered, these medications have been reviewed and there are no changes at this time.    Levora DredgeGregory Rhiannon Sassaman, MD  01/04/2017 3:28 PM

## 2017-01-05 ENCOUNTER — Inpatient Hospital Stay: Admission: RE | Admit: 2017-01-05 | Payer: Medicare HMO | Source: Ambulatory Visit

## 2017-01-05 ENCOUNTER — Other Ambulatory Visit (INDEPENDENT_AMBULATORY_CARE_PROVIDER_SITE_OTHER): Payer: Self-pay | Admitting: Vascular Surgery

## 2017-01-05 MED ORDER — CEFAZOLIN SODIUM-DEXTROSE 2-4 GM/100ML-% IV SOLN: 2.0000 g | Freq: Once | INTRAVENOUS | Status: DC

## 2017-01-06 ENCOUNTER — Encounter: Admission: RE | Payer: Self-pay | Source: Ambulatory Visit

## 2017-01-06 ENCOUNTER — Ambulatory Visit: Admission: RE | Admit: 2017-01-06 | Payer: Medicare HMO | Source: Ambulatory Visit | Admitting: Vascular Surgery

## 2017-01-06 ENCOUNTER — Encounter: Payer: Medicare HMO | Attending: Internal Medicine | Admitting: Internal Medicine

## 2017-01-06 ENCOUNTER — Telehealth (INDEPENDENT_AMBULATORY_CARE_PROVIDER_SITE_OTHER): Payer: Self-pay

## 2017-01-06 DIAGNOSIS — M109 Gout, unspecified: Secondary | ICD-10-CM | POA: Diagnosis not present

## 2017-01-06 DIAGNOSIS — L97311 Non-pressure chronic ulcer of right ankle limited to breakdown of skin: Secondary | ICD-10-CM | POA: Insufficient documentation

## 2017-01-06 DIAGNOSIS — M1990 Unspecified osteoarthritis, unspecified site: Secondary | ICD-10-CM | POA: Insufficient documentation

## 2017-01-06 DIAGNOSIS — I10 Essential (primary) hypertension: Secondary | ICD-10-CM | POA: Diagnosis not present

## 2017-01-06 DIAGNOSIS — L97513 Non-pressure chronic ulcer of other part of right foot with necrosis of muscle: Secondary | ICD-10-CM | POA: Insufficient documentation

## 2017-01-06 DIAGNOSIS — F17210 Nicotine dependence, cigarettes, uncomplicated: Secondary | ICD-10-CM | POA: Insufficient documentation

## 2017-01-06 DIAGNOSIS — I70245 Atherosclerosis of native arteries of left leg with ulceration of other part of foot: Secondary | ICD-10-CM | POA: Insufficient documentation

## 2017-01-06 DIAGNOSIS — I70235 Atherosclerosis of native arteries of right leg with ulceration of other part of foot: Secondary | ICD-10-CM | POA: Insufficient documentation

## 2017-01-06 SURGERY — LOWER EXTREMITY ANGIOGRAPHY
Anesthesia: Moderate Sedation | Site: Leg Lower | Laterality: Right

## 2017-01-06 NOTE — Telephone Encounter (Signed)
I have attempted numerous times to get through to speak with someone regarding the patient and getting him rescheduled for his procedure, when I call the receptionist sends me to a line that no one picks up nor is there a voicemail to leave a message.

## 2017-01-07 NOTE — Progress Notes (Signed)
[place], [place] ([id number]) Visit Report for 01/06/2017 Arrival Information Details Patient Name: [name] Date of Service: 01/06/2017 9:15 AM Medical Record Number: [id number] Patient Account Number: [id number] Date of Birth/Sex: 03-13-1934 (82 y.o. Male) Treating RN: Curtis Sites Primary Care Reda Citron: Darreld Mclean Other Clinician: Referring Senai Ramnath: Darreld Mclean Treating Alfard Cochrane/Extender: Altamese [place] in Treatment: 1 Visit Information History Since Last Visit Added or deleted any medications: No Patient Arrived: Wheel Chair Any new allergies or adverse reactions: No Arrival Time: 09:49 Had a fall or experienced change in No activities of daily living that may affect Accompanied By: self risk of falls: Transfer Assistance: Manual Signs or symptoms of abuse/neglect since last No Patient Identification Verified: Yes visito Secondary Verification Process Yes Hospitalized since last visit: No Completed: Has Dressing in Place as Prescribed: Yes Pain Present Now: No Electronic Signature(s) Signed: 01/06/2017 4:53:36 PM By: Curtis Sites Entered By: Curtis Sites on 01/06/2017 09:52:00 Thissen, Rainey [name] ([id number]) -------------------------------------------------------------------------------- Clinic Level of Care Assessment Details Patient Name: [name] Date of Service: 01/06/2017 9:15 AM Medical Record Number: [id number] Patient Account Number: [id number] Date of Birth/Sex: [birth year] (83 y.o. Male) Treating RN: Curtis Sites Primary Care Caitlen Worth: Darreld Mclean Other Clinician: Referring Ahjanae Cassel: Darreld Mclean Treating Macio Kissoon/Extender: Altamese [place] in Treatment: 1 Clinic Level of Care Assessment Items TOOL 4 Quantity Score []  - Use when only an EandM is performed on FOLLOW-UP visit 0 ASSESSMENTS - Nursing Assessment / Reassessment X - Reassessment of Co-morbidities (includes updates in patient status) 1 10 X - Reassessment of Adherence to  Treatment Plan 1 5 ASSESSMENTS - Wound and Skin Assessment / Reassessment []  - Simple Wound Assessment / Reassessment - one wound 0 X - Complex Wound Assessment / Reassessment - multiple wounds 3 5 []  - Dermatologic / Skin Assessment (not related to wound area) 0 ASSESSMENTS - Focused Assessment []  - Circumferential Edema Measurements - multi extremities 0 []  - Nutritional Assessment / Counseling / Intervention 0 X - Lower Extremity Assessment (monofilament, tuning fork, pulses) 1 5 []  - Peripheral Arterial Disease Assessment (using hand held doppler) 0 ASSESSMENTS - Ostomy and/or Continence Assessment and Care []  - Incontinence Assessment and Management 0 []  - Ostomy Care Assessment and Management (repouching, etc.) 0 PROCESS - Coordination of Care X - Simple Patient / Family Education for ongoing care 1 15 []  - Complex (extensive) Patient / Family Education for ongoing care 0 []  - Staff obtains Chiropractor, Records, Test Results / Process Orders 0 []  - Staff telephones HHA, Nursing Homes / Clarify orders / etc 0 []  - Routine Transfer to another Facility (non-emergent condition) 0 [place], [place] ([id number]) []  - Routine Hospital Admission (non-emergent condition) 0 []  - New Admissions / Manufacturing engineer / Ordering NPWT, Apligraf, etc. 0 []  - Emergency Hospital Admission (emergent condition) 0 X - Simple Discharge Coordination 1 10 []  - Complex (extensive) Discharge Coordination 0 PROCESS - Special Needs []  - Pediatric / Minor Patient Management 0 []  - Isolation Patient Management 0 []  - Hearing / Language / Visual special needs 0 []  - Assessment of Community assistance (transportation, D/C planning, etc.) 0 []  - Additional assistance / Altered mentation 0 []  - Support Surface(s) Assessment (bed, cushion, seat, etc.) 0 INTERVENTIONS - Wound Cleansing / Measurement []  - Simple Wound Cleansing - one wound 0 X - Complex Wound Cleansing - multiple wounds 3 5 X - Wound Imaging  (photographs - any number of wounds) 1 5 []  - Wound Tracing (instead of photographs) 0 []  - Simple Wound Measurement -  one wound 0 X - Complex Wound Measurement - multiple wounds 3 5 INTERVENTIONS - Wound Dressings X - Small Wound Dressing one or multiple wounds 3 10 []  - Medium Wound Dressing one or multiple wounds 0 []  - Large Wound Dressing one or multiple wounds 0 []  - Application of Medications - topical 0 []  - Application of Medications - injection 0 INTERVENTIONS - Miscellaneous []  - External ear exam 0 [name] ([id number]) []  - Specimen Collection (cultures, biopsies, blood, body fluids, etc.) 0 []  - Specimen(s) / Culture(s) sent or taken to Lab for analysis 0 []  - Patient Transfer (multiple staff / Michiel Sites Lift / Similar devices) 0 []  - Simple Staple / Suture removal (25 or less) 0 []  - Complex Staple / Suture removal (26 or more) 0 []  - Hypo / Hyperglycemic Management (close monitor of Blood Glucose) 0 []  - Ankle / Brachial Index (ABI) - do not check if billed separately 0 X - Vital Signs 1 5 Has the patient been seen at the hospital within the last three years: Yes Total Score: 130 Level Of Care: New/Established - Level 4 Electronic Signature(s) Signed: 01/06/2017 4:53:36 PM By: Curtis Sites Entered By: Curtis Sites on 01/06/2017 10:46:44 Pinder, Rainey [name] ([id number]) -------------------------------------------------------------------------------- Encounter Discharge Information Details Patient Name: [name] Date of Service: 01/06/2017 9:15 AM Medical Record Number: [id number] Patient Account Number: [id number] Date of Birth/Sex: 12/18/1933 (83 y.o. Male) Treating RN: Curtis Sites Primary Care Marah Park: Darreld Mclean Other Clinician: Referring Egor Fullilove: Darreld Mclean Treating Natesha Hassey/Extender: Altamese [place] in Treatment: 1 Encounter Discharge Information Items Discharge Pain Level: 0 Discharge Condition: Stable Ambulatory Status:  Wheelchair Discharge Destination: Nursing Home Transportation: Private Auto Accompanied By: self Schedule Follow-up Appointment: Yes Medication Reconciliation completed No and provided to Patient/Care Natoya Viscomi: Provided on Clinical Summary of Care: 01/06/2017 Form Type Recipient Paper Patient LP Electronic Signature(s) Signed: 01/06/2017 10:47:48 AM By: Curtis Sites Previous Signature: 01/06/2017 10:27:07 AM Version By: Gwenlyn Perking Entered By: Curtis Sites on 01/06/2017 10:47:48 Baranek, Rainey [name] ([id number]) -------------------------------------------------------------------------------- Lower Extremity Assessment Details Patient Name: Minton, Rainey [name] Date of Service: 01/06/2017 9:15 AM Medical Record Number: [id number] Patient Account Number: [id number] Date of Birth/Sex: 1934/01/13 (83 y.o. Male) Treating RN: Huel Coventry Primary Care Hadi Dubin: Darreld Mclean Other Clinician: Referring Rosalita Carey: Darreld Mclean Treating Arius Harnois/Extender: Maxwell Caul Weeks in Treatment: 1 Vascular Assessment Pulses: Dorsalis Pedis Palpable: [Right:No] Doppler Audible: [Right:Inaudible] Posterior Tibial Palpable: [Right:No] Doppler Audible: [Right:Inaudible] Extremity colors, hair growth, and conditions: Extremity Color: [Right:Dusky] Hair Growth on Extremity: [Right:No] Temperature of Extremity: [Right:Warm] Capillary Refill: [Right:> 3 seconds] Dependent Rubor: [Right:No] Blanched when Elevated: [Right:No] Lipodermatosclerosis: [Right:No] Toe Nail Assessment Left: Right: Thick: Yes Discolored: Yes Deformed: No Improper Length and Hygiene: No Electronic Signature(s) Signed: 01/06/2017 3:55:06 PM By: Elliot Gurney, BSN, RN, CWS, Kim RN, BSN Entered By: Elliot Gurney, BSN, RN, CWS, Kim on 01/06/2017 10:00:14 Pareja, Rainey [name] ([id number]) -------------------------------------------------------------------------------- Multi Wound Chart Details Patient Name: [name] Date of Service: 01/06/2017 9:15  AM Medical Record Number: [id number] Patient Account Number: [id number] Date of Birth/Sex: May 23, 1934 (83 y.o. Male) Treating RN: Huel Coventry Primary Care Keldric Poyer: Darreld Mclean Other Clinician: Referring Julien Berryman: Darreld Mclean Treating Millissa Deese/Extender: Maxwell Caul Weeks in Treatment: 1 Vital Signs Height(in): Pulse(bpm): 84 Weight(lbs): Blood Pressure 119/57 (mmHg): Body Mass Index(BMI): Temperature(F): Respiratory Rate 16 (breaths/min): Photos: Wound Location: Right Ankle - Lateral Right Malleolus Right Toe Third Wounding Event: Gradually Appeared Gradually Appeared Gradually Appeared Primary Etiology: To be determined Arterial Insufficiency Ulcer To be determined Comorbid History: Hypertension, Peripheral Hypertension, Peripheral  Hypertension, Peripheral Venous Disease, Gout, Venous Disease, Gout, Venous Disease, Gout, Osteoarthritis Osteoarthritis Osteoarthritis Date Acquired: 12/04/2016 12/04/2016 12/04/2016 Weeks of Treatment: 1 1 1  Wound Status: Open Open Open Pending Amputation on Yes Yes Yes Presentation: Measurements L x W x D 2.5x2.7x0.3 1.9x1.5x0.1 2.5x2.7x0.1 (cm) Area (cm) : 5.301 2.238 5.301 Volume (cm) : 1.59 0.224 0.53 % Reduction in Area: 7.30% 56.20% -46.70% % Reduction in Volume: 7.30% 78.10% 26.70% Classification: Full Thickness With Full Thickness With Full Thickness Without Exposed Support Exposed Support Exposed Support Structures Structures Structures Exudate Amount: Large Large Large Exudate Type: Serous Serous Purulent Exudate Color: amber amber yellow, brown, green Tahir, Rainey PinesLINNIES ([id number]) Foul Odor After No No Yes Cleansing: Odor Anticipated Due to N/A N/A No Product Use: Wound Margin: Flat and Intact Flat and Intact Flat and Intact Granulation Amount: Medium (34-66%) Medium (34-66%) None Present (0%) Granulation Quality: Red Red N/A Necrotic Amount: Medium (34-66%) Medium (34-66%) Large (67-100%) Necrotic Tissue: Adherent  Slough Adherent Slough Eschar Exposed Structures: Fat Layer (Subcutaneous Fat Layer (Subcutaneous Fascia: No Tissue) Exposed: Yes Tissue) Exposed: Yes Fat Layer (Subcutaneous Fascia: No Fascia: No Tissue) Exposed: No Tendon: No Tendon: No Tendon: No Muscle: No Muscle: No Muscle: No Joint: No Joint: No Joint: No Bone: No Bone: No Bone: No Epithelialization: None None None Periwound Skin Texture: Excoriation: No Excoriation: No Excoriation: No Induration: No Induration: No Induration: No Callus: No Callus: No Callus: No Crepitus: No Crepitus: No Crepitus: No Rash: No Rash: No Rash: No Scarring: No Scarring: No Scarring: No Periwound Skin Maceration: No Maceration: No Maceration: No Moisture: Dry/Scaly: No Dry/Scaly: No Dry/Scaly: No Periwound Skin Color: Atrophie Blanche: No Atrophie Blanche: No Atrophie Blanche: No Cyanosis: No Cyanosis: No Cyanosis: No Ecchymosis: No Ecchymosis: No Ecchymosis: No Erythema: No Erythema: No Erythema: No Hemosiderin Staining: No Hemosiderin Staining: No Hemosiderin Staining: No Mottled: No Mottled: No Mottled: No Pallor: No Pallor: No Pallor: No Rubor: No Rubor: No Rubor: No Temperature: No Abnormality No Abnormality No Abnormality Tenderness on Yes Yes Yes Palpation: Wound Preparation: Ulcer Cleansing: Ulcer Cleansing: Ulcer Cleansing: Rinsed/Irrigated with Rinsed/Irrigated with Rinsed/Irrigated with Saline Saline Saline Topical Anesthetic Topical Anesthetic Topical Anesthetic Applied: Other: lidocaine Applied: Other: lidocaine Applied: Other: lidocaine 4% 4% 4% Treatment Notes Wound #1 (Right, Lateral Ankle) 1. Cleansed with: Clean wound with Normal Saline 2. Anesthetic Kiker, Rainey PinesLINNIES ([id number]) Topical Lidocaine 4% cream to wound bed prior to debridement 4. Dressing Applied: Aquacel Ag 5. Secondary Dressing Applied Guaze, ABD and kerlix/Conform 7. Secured with Tape Notes netting Wound #2  (Right Malleolus) 1. Cleansed with: Clean wound with Normal Saline 2. Anesthetic Topical Lidocaine 4% cream to wound bed prior to debridement 4. Dressing Applied: Aquacel Ag 5. Secondary Dressing Applied Guaze, ABD and kerlix/Conform 7. Secured with Tape Notes netting Wound #3 (Right Toe Third) 1. Cleansed with: Clean wound with Normal Saline 2. Anesthetic Topical Lidocaine 4% cream to wound bed prior to debridement 4. Dressing Applied: Aquacel Ag 5. Secondary Dressing Applied Guaze, ABD and kerlix/Conform 7. Secured with Tape Notes netting Electronic Signature(s) Signed: 01/06/2017 5:35:48 PM By: Baltazar Najjarobson, Michael MD Entered By: Baltazar Najjarobson, Michael on 01/06/2017 11:20:48 Schwalb, Rainey PinesLINNIES ([id number]) -------------------------------------------------------------------------------- Multi-Disciplinary Care Plan Details Patient Name: [name] [name], Kalid Date of Service: 01/06/2017 9:15 AM Medical Record Number: [id number] Patient Account Number: [id number] Date of Birth/Sex: 20-Mar-1934 56(83 y.o. Male) Treating RN: Huel CoventryWoody, Kim Primary Care Ailis Rigaud: Darreld McleanMILES, LINDA Other Clinician: Referring Kyle Stansell: Darreld McleanMILES, LINDA Treating Annaya Bangert/Extender: Altamese CarolinaOBSON, MICHAEL G Weeks in Treatment: 1 Active Inactive ` Abuse / Safety /  Falls / Self Care Management Nursing Diagnoses: Potential for falls Goals: Patient will not experience any injury related to falls Date Initiated: 12/30/2016 Target Resolution Date: 02/27/2017 Goal Status: Active Interventions: Assess fall risk on admission and as needed Notes: ` Orientation to the Wound Care Program Nursing Diagnoses: Knowledge deficit related to the wound healing center program Goals: Patient/caregiver will verbalize understanding of the Wound Healing Center Program Date Initiated: 12/30/2016 Target Resolution Date: 02/27/2017 Goal Status: Active Interventions: Provide education on orientation to the wound center Notes: ` Wound/Skin Impairment Nursing  Diagnoses: Impaired tissue integrity Derwin, [place] ([id number]) Goals: Ulcer/skin breakdown will have a volume reduction of 30% by week 4 Date Initiated: 12/30/2016 Target Resolution Date: 02/27/2017 Goal Status: Active Ulcer/skin breakdown will have a volume reduction of 50% by week 8 Date Initiated: 12/30/2016 Target Resolution Date: 02/27/2017 Goal Status: Active Ulcer/skin breakdown will have a volume reduction of 80% by week 12 Date Initiated: 12/30/2016 Target Resolution Date: 02/27/2017 Goal Status: Active Ulcer/skin breakdown will heal within 14 weeks Date Initiated: 12/30/2016 Target Resolution Date: 02/27/2017 Goal Status: Active Interventions: Assess patient/caregiver ability to obtain necessary supplies Assess patient/caregiver ability to perform ulcer/skin care regimen upon admission and as needed Assess ulceration(s) every visit Notes: Electronic Signature(s) Signed: 01/06/2017 3:55:06 PM By: Elliot Gurney, BSN, RN, CWS, Kim RN, BSN Entered By: Elliot Gurney, BSN, RN, CWS, Kim on 01/06/2017 10:00:21 Vanwagner, Rainey [name] ([id number]) -------------------------------------------------------------------------------- Pain Assessment Details Patient Name: [name] Date of Service: 01/06/2017 9:15 AM Medical Record Number: [id number] Patient Account Number: [id number] Date of Birth/Sex: 1933-09-07 (83 y.o. Male) Treating RN: Huel Coventry Primary Care Soumya Colson: Darreld Mclean Other Clinician: Referring Taylon Coole: Darreld Mclean Treating Emmaly Leech/Extender: Maxwell Caul Weeks in Treatment: 1 Active Problems Location of Pain Severity and Description of Pain Patient Has Paino Yes Site Locations Pain Location: Pain in Ulcers With Dressing Change: Yes Rate the pain. Current Pain Level: 4 Worst Pain Level: 6 Least Pain Level: 2 Character of Pain Describe the Pain: Burning, Sharp, Tender Pain Management and Medication Current Pain Management: Goals for Pain Management Topical or injectable  lidocaine is offered to patient for acute pain when surgical debridement is performed. If needed, Patient is instructed to use over the counter pain medication for the following 24-48 hours after debridement. Wound care MDs do not prescribed pain medications. Patient has chronic pain or uncontrolled pain. Patient has been instructed to make an appointment with their Primary Care Physician for pain management. Electronic Signature(s) Signed: 01/06/2017 3:55:06 PM By: Elliot Gurney, BSN, RN, CWS, Kim RN, BSN Entered By: Elliot Gurney, BSN, RN, CWS, Kim on 01/06/2017 09:54:05 Trebilcock, Rainey [name] ([id number]) -------------------------------------------------------------------------------- Patient/Caregiver Education Details Patient Name: [name] Date of Service: 01/06/2017 9:15 AM Medical Record Number: [id number] Patient Account Number: [id number] Date of Birth/Gender: January 28, 1934 (83 y.o. Male) Treating RN: Curtis Sites Primary Care Physician: Darreld Mclean Other Clinician: Referring Physician: Darreld Mclean Treating Physician/Extender: Altamese [place] in Treatment: 1 Education Assessment Education Provided To: Caregiver SNF staff via written orders Education Topics Provided Wound/Skin Impairment: Handouts: Other: wound care as ordered Methods: Printed Electronic Signature(s) Signed: 01/06/2017 4:53:36 PM By: Curtis Sites Entered By: Curtis Sites on 01/06/2017 10:48:14 Schmiesing, Rainey [name] ([id number]) -------------------------------------------------------------------------------- Wound Assessment Details Patient Name: [name] Date of Service: 01/06/2017 9:15 AM Medical Record Number: [id number] Patient Account Number: [id number] Date of Birth/Sex: 08/21/1933 (83 y.o. Male) Treating RN: Huel Coventry Primary Care Deola Rewis: Darreld Mclean Other Clinician: Referring Cristen Bredeson: Darreld Mclean Treating Rianne Degraaf/Extender: Maxwell Caul Weeks in Treatment: 1 Wound Status Wound Number: 1 Primary  To  be determined Etiology: Wound Location: Right Ankle - Lateral Wound Open Wounding Event: Gradually Appeared Status: Date Acquired: 12/04/2016 Comorbid Hypertension, Peripheral Venous Weeks Of Treatment: 1 History: Disease, Gout, Osteoarthritis Clustered Wound: No Pending Amputation On Presentation Photos Photo Uploaded By: Curtis Sites on 01/06/2017 10:57:59 Wound Measurements Length: (cm) 2.5 Width: (cm) 2.7 Depth: (cm) 0.3 Area: (cm) 5.301 Volume: (cm) 1.59 % Reduction in Area: 7.3% % Reduction in Volume: 7.3% Epithelialization: None Tunneling: No Undermining: No Wound Description Full Thickness With Exposed Classification: Support Structures Wound Margin: Flat and Intact Exudate Large Amount: Exudate Type: Serous Exudate Color: amber Foul Odor After Cleansing: No Slough/Fibrino Yes Wound Bed Granulation Amount: Medium (34-66%) Exposed Structure Granulation Quality: Red Fascia Exposed: No Mccartney, Rainey [name] ([id number]) Necrotic Amount: Medium (34-66%) Fat Layer (Subcutaneous Tissue) Exposed: Yes Necrotic Quality: Adherent Slough Tendon Exposed: No Muscle Exposed: No Joint Exposed: No Bone Exposed: No Periwound Skin Texture Texture Color No Abnormalities Noted: No No Abnormalities Noted: No Callus: No Atrophie Blanche: No Crepitus: No Cyanosis: No Excoriation: No Ecchymosis: No Induration: No Erythema: No Rash: No Hemosiderin Staining: No Scarring: No Mottled: No Pallor: No Moisture Rubor: No No Abnormalities Noted: No Dry / Scaly: No Temperature / Pain Maceration: No Temperature: No Abnormality Tenderness on Palpation: Yes Wound Preparation Ulcer Cleansing: Rinsed/Irrigated with Saline Topical Anesthetic Applied: Other: lidocaine 4%, Treatment Notes Wound #1 (Right, Lateral Ankle) 1. Cleansed with: Clean wound with Normal Saline 2. Anesthetic Topical Lidocaine 4% cream to wound bed prior to debridement 4. Dressing Applied: Aquacel  Ag 5. Secondary Dressing Applied Guaze, ABD and kerlix/Conform 7. Secured with Tape Notes Government social research officer) Signed: 01/06/2017 3:55:06 PM By: Elliot Gurney, BSN, RN, CWS, Kim RN, BSN Entered By: Elliot Gurney, BSN, RN, CWS, Kim on 01/06/2017 09:56:18 Tweten, Rainey [name] ([id number]) -------------------------------------------------------------------------------- Wound Assessment Details Patient Name: Achor, Rainey [name] Date of Service: 01/06/2017 9:15 AM Medical Record Number: [id number] Patient Account Number: [id number] Date of Birth/Sex: 1934/01/17 (83 y.o. Male) Treating RN: Huel Coventry Primary Care Purity Irmen: Darreld Mclean Other Clinician: Referring Evangelia Whitaker: Darreld Mclean Treating Anner Baity/Extender: Maxwell Caul Weeks in Treatment: 1 Wound Status Wound Number: 2 Primary Arterial Insufficiency Ulcer Etiology: Wound Location: Right Malleolus Wound Open Wounding Event: Gradually Appeared Status: Date Acquired: 12/04/2016 Comorbid Hypertension, Peripheral Venous Weeks Of Treatment: 1 History: Disease, Gout, Osteoarthritis Clustered Wound: No Pending Amputation On Presentation Photos Photo Uploaded By: Curtis Sites on 01/06/2017 10:58:00 Wound Measurements Length: (cm) 1.9 % Reduction in Width: (cm) 1.5 % Reduction in Depth: (cm) 0.1 Epithelializat Area: (cm) 2.238 Tunneling: Volume: (cm) 0.224 Undermining: Area: 56.2% Volume: 78.1% ion: None No No Wound Description Full Thickness With Exposed Foul Odor Afte Classification: Support Structures Slough/Fibrino Wound Margin: Flat and Intact Exudate Large Amount: Exudate Type: Serous Exudate Color: amber r Cleansing: No Yes Wound Bed Granulation Amount: Medium (34-66%) Exposed Structure Granulation Quality: Red Fascia Exposed: No Koehn, Rainey [name] ([id number]) Necrotic Amount: Medium (34-66%) Fat Layer (Subcutaneous Tissue) Exposed: Yes Necrotic Quality: Adherent Slough Tendon Exposed: No Muscle Exposed: No Joint  Exposed: No Bone Exposed: No Periwound Skin Texture Texture Color No Abnormalities Noted: No No Abnormalities Noted: No Callus: No Atrophie Blanche: No Crepitus: No Cyanosis: No Excoriation: No Ecchymosis: No Induration: No Erythema: No Rash: No Hemosiderin Staining: No Scarring: No Mottled: No Pallor: No Moisture Rubor: No No Abnormalities Noted: No Dry / Scaly: No Temperature / Pain Maceration: No Temperature: No Abnormality Tenderness on Palpation: Yes Wound Preparation Ulcer Cleansing: Rinsed/Irrigated with Saline Topical Anesthetic Applied: Other: lidocaine 4%, Treatment Notes Wound #  2 (Right Malleolus) 1. Cleansed with: Clean wound with Normal Saline 2. Anesthetic Topical Lidocaine 4% cream to wound bed prior to debridement 4. Dressing Applied: Aquacel Ag 5. Secondary Dressing Applied Guaze, ABD and kerlix/Conform 7. Secured with Tape Notes Government social research officer) Signed: 01/06/2017 3:55:06 PM By: Elliot Gurney, BSN, RN, CWS, Kim RN, BSN Entered By: Elliot Gurney, BSN, RN, CWS, Kim on 01/06/2017 09:55:36 Hasegawa, Rainey [name] ([id number]) -------------------------------------------------------------------------------- Wound Assessment Details Patient Name: Kroening, Rainey [name] Date of Service: 01/06/2017 9:15 AM Medical Record Number: [id number] Patient Account Number: [id number] Date of Birth/Sex: [birth year] (83 y.o. Male) Treating RN: Huel Coventry Primary Care Keltie Labell: Darreld Mclean Other Clinician: Referring Semisi Biela: Darreld Mclean Treating Cerenity Goshorn/Extender: Maxwell Caul Weeks in Treatment: 1 Wound Status Wound Number: 3 Primary To be determined Etiology: Wound Location: Right Toe Third Wound Open Wounding Event: Gradually Appeared Status: Date Acquired: 12/04/2016 Comorbid Hypertension, Peripheral Venous Weeks Of Treatment: 1 History: Disease, Gout, Osteoarthritis Clustered Wound: No Pending Amputation On Presentation Photos Photo Uploaded By: Curtis Sites  on 01/06/2017 10:58:40 Wound Measurements Length: (cm) 2.5 Width: (cm) 2.7 Depth: (cm) 0.1 Area: (cm) 5.301 Volume: (cm) 0.53 % Reduction in Area: -46.7% % Reduction in Volume: 26.7% Epithelialization: None Wound Description Full Thickness Without Exposed Classification: Support Structures Wound Margin: Flat and Intact Exudate Large Amount: Exudate Type: Purulent Exudate Color: yellow, brown, green Foul Odor After Cleansing: Yes Due to Product Use: No Slough/Fibrino No Wound Bed Granulation Amount: None Present (0%) Exposed Structure Necrotic Amount: Large (67-100%) Fascia Exposed: No Barkan, Rainey [name] ([id number]) Necrotic Quality: Eschar Fat Layer (Subcutaneous Tissue) Exposed: No Tendon Exposed: No Muscle Exposed: No Joint Exposed: No Bone Exposed: No Periwound Skin Texture Texture Color No Abnormalities Noted: No No Abnormalities Noted: No Callus: No Atrophie Blanche: No Crepitus: No Cyanosis: No Excoriation: No Ecchymosis: No Induration: No Erythema: No Rash: No Hemosiderin Staining: No Scarring: No Mottled: No Pallor: No Moisture Rubor: No No Abnormalities Noted: No Dry / Scaly: No Temperature / Pain Maceration: No Temperature: No Abnormality Tenderness on Palpation: Yes Wound Preparation Ulcer Cleansing: Rinsed/Irrigated with Saline Topical Anesthetic Applied: Other: lidocaine 4%, Treatment Notes Wound #3 (Right Toe Third) 1. Cleansed with: Clean wound with Normal Saline 2. Anesthetic Topical Lidocaine 4% cream to wound bed prior to debridement 4. Dressing Applied: Aquacel Ag 5. Secondary Dressing Applied Guaze, ABD and kerlix/Conform 7. Secured with Tape Notes Government social research officer) Signed: 01/06/2017 3:55:06 PM By: Elliot Gurney, BSN, RN, CWS, Kim RN, BSN Entered By: Elliot Gurney, BSN, RN, CWS, Kim on 01/06/2017 09:58:04 Sabatino, Rainey [name] ([id number]) -------------------------------------------------------------------------------- Vitals  Details Patient Name: [name] Date of Service: 01/06/2017 9:15 AM Medical Record Number: [id number] Patient Account Number: [id number] Date of Birth/Sex: 30-Mar-1934 (83 y.o. Male) Treating RN: Curtis Sites Primary Care Caly Pellum: Darreld Mclean Other Clinician: Referring Jaquanna Ballentine: Darreld Mclean Treating Letta Cargile/Extender: Maxwell Caul Weeks in Treatment: 1 Vital Signs Time Taken: 09:52 Pulse (bpm): 84 Respiratory Rate (breaths/min): 16 Blood Pressure (mmHg): 119/57 Reference Range: 80 - 120 mg / dl Electronic Signature(s) Signed: 01/06/2017 3:55:06 PM By: Elliot Gurney, BSN, RN, CWS, Kim RN, BSN Entered By: Elliot Gurney, BSN, RN, CWS, Kim on 01/06/2017 09:54:10

## 2017-01-08 ENCOUNTER — Encounter (INDEPENDENT_AMBULATORY_CARE_PROVIDER_SITE_OTHER): Payer: Self-pay

## 2017-01-08 NOTE — Progress Notes (Signed)
Basnett, [place] ([id number]) Visit Report for 01/06/2017 Chief Complaint Document Details Patient Name: [name], [name] Date of Service: 01/06/2017 9:15 AM Medical Record Number: [id number] Patient Account Number: [id number] Date of Birth/Sex: 1933/08/04 (83 y.o. Male) Treating RN: Montey Hora Primary Care Provider: Delight Stare Other Clinician: Referring Provider: Delight Stare Treating Provider/Extender: Ricard Dillon Weeks in Treatment: 1 Information Obtained from: Patient Chief Complaint 12/30/16; patient arrives from [place] unaccompanied. He has 3 open wounds on the right foot and 2 threatened areas Electronic Signature(s) Signed: 01/06/2017 5:35:48 PM By: Linton Ham MD Entered By: Linton Ham on 01/06/2017 11:21:01 Saine, [name] ([id number]) -------------------------------------------------------------------------------- HPI Details Patient Name: [name] Date of Service: 01/06/2017 9:15 AM Medical Record Number: [id number] Patient Account Number: [id number] Date of Birth/Sex: June 09, 1934 (84 y.o. Male) Treating RN: Montey Hora Primary Care Provider: Delight Stare Other Clinician: Referring Provider: Delight Stare Treating Provider/Extender: Tito Dine in Treatment: 1 History of Present Illness HPI Description: 12/30/16; this is an 83 year old man who comes from [place] skilled facility. He comes today unaccompanied and without any history. Looking through [facility] link I am able to see that he was admitted to hospital from 5/25 through 12/04/16 at that point with dehydration, severe hyponatremia acute kidney injury. He was treated with IV fluid replacement including 3% normal saline. Nowhere in his discharge summary did I see any reference to wounds on his feet and is mentioned he does not come in with any history. He does come in with a pack of cigarettes therefore I'm assuming he is a smoker. He is not a listed diabetic.  There is some reference in his notes that he has a DSS Education officer, museum is his guardian I'm not completely sure if that is true in any case the patient has some degree of dementia although he is able to tell me that he is 83 years old came from [place]. He is not able to give a history of these wounds. Our intake nurse notes maggots in the right third toe. Beyond this he has an area over the right lateral malleolus on the right lateral foot. He also has to threatened areas on the base of the right fifth metatarsal and on the lateral aspect of the right fifth metatarsal head. We are completely unable to do ABIs in the clinic largely because when we inflated the cuff he seemed to withdraw the foot. 01/05/17; the patient was seen urgently by Dr. Delana Meyer of vein and vascular. He agreed with our assessment of critical limb ischemia and set him up for an angiogram. Unfortunately the patient was not transported for preoperative evaluation therefore the angiogram has been canceled. I am not exactly sure if and when this is going to be rescheduled. The patient's wounds look about the same, right lateral ankle right lateral foot third toe. There is also concerning areas over the fifth met head and base of the fifth metatarsal which are not open but are covered by a dark eschar logorrhea and painful. We have been using silver alginate and x-ray of the right foot done at the facility showed no definite radiographic evidence of acute fracture or dislocation there was an old avulsion fracture of the proximal phalanx fifth digit as well as old fractures of the base of the proximal phalanx of the fifth digit. There is no comment about osteomyelitis Electronic Signature(s) Signed: 01/06/2017 5:35:48 PM By: Linton Ham MD Entered By: Linton Ham on 01/06/2017 11:23:46 Jablonski, [name] ([id number]) -------------------------------------------------------------------------------- Physical Exam  Details  Patient Name: [name] Date of Service: 01/06/2017 9:15 AM Medical Record Number: [id number] Patient Account Number: [id number] Date of Birth/Sex: [birth year] (83 y.o. Male) Treating RN: Montey Hora Primary Care Provider: Delight Stare Other Clinician: Referring Provider: Delight Stare Treating Provider/Extender: Ricard Dillon Weeks in Treatment: 1 Constitutional Sitting or standing Blood Pressure is within target range for patient.. Pulse regular and within target range for patient.Marland Kitchen Respirations regular, non-labored and within target range.. Temperature is normal and within the target range for the patient.Marland Kitchen appears in no distress. Eyes Conjunctivae clear. No discharge. Respiratory Respiratory effort is easy and symmetric bilaterally. Rate is normal at rest and on room air.. Bilateral breath sounds are clear and equal in all lobes with no wheezes, rales or rhonchi.. Cardiovascular Femoral arteries without bruits and pulses strong. I did not feel popliteal pulses bilaterally. Pedal pulses absent bilaterally.. Gastrointestinal (GI) Abdomen is soft and non-distended without masses or tenderness. Bowel sounds active in all quadrants.. Lymphatic Nonpalpable the popliteal or inguinal area. Psychiatric Patient appears depressed today.. Notes Wound exam; the patient has 2 substantial wounds 1 over the lateral malleolus one on the right lateral foot these are full-thickness wounds. Neither one of them looks acutely infected. He also has necrotic and painful area with ongoing ischemia in the right third toe. Finally he also has threatened areas on the right fifth metatarsal head and the base of the right fifth metatarsal on the lateral aspect of both of these areas. These are boggy and tender and I suspect there may be underlying tissue damage here as well. Electronic Signature(s) Signed: 01/06/2017 5:35:48 PM By: Linton Ham MD Entered By: Linton Ham on 01/06/2017  11:26:46 Talwar, [name] ([id number]) -------------------------------------------------------------------------------- Physician Orders Details Patient Name: [name] Date of Service: 01/06/2017 9:15 AM Medical Record Number: [id number] Patient Account Number: [id number] Date of Birth/Sex: 10-Sep-1933 (83 y.o. Male) Treating RN: Cornell Barman Primary Care Provider: Delight Stare Other Clinician: Referring Provider: Delight Stare Treating Provider/Extender: Tito Dine in Treatment: 1 Verbal / Phone Orders: No Diagnosis Coding Wound Cleansing Wound #1 Right,Lateral Ankle o Clean wound with Normal Saline. o May Shower, gently pat wound dry prior to applying new dressing. Wound #2 Right Malleolus o Clean wound with Normal Saline. o May Shower, gently pat wound dry prior to applying new dressing. Wound #3 Right Toe Third o Clean wound with Normal Saline. o May Shower, gently pat wound dry prior to applying new dressing. Anesthetic Wound #1 Right,Lateral Ankle o Topical Lidocaine 4% cream applied to wound bed prior to debridement Wound #2 Right Malleolus o Topical Lidocaine 4% cream applied to wound bed prior to debridement Wound #3 Right Toe Third o Topical Lidocaine 4% cream applied to wound bed prior to debridement Primary Wound Dressing Wound #1 Right,Lateral Ankle o Aquacel Ag Wound #2 Right Malleolus o Aquacel Ag Wound #3 Right Toe Third o Aquacel Ag Secondary Dressing Wound #1 Right,Lateral Ankle o Gauze and Kerlix/Conform Jorgensen, [name] ([id number]) Wound #2 Right Malleolus o Gauze and Kerlix/Conform Wound #3 Right Toe Third o Gauze and Kerlix/Conform Dressing Change Frequency Wound #1 Right,Lateral Ankle o Change dressing every day. Wound #2 Right Malleolus o Change dressing every day. Wound #3 Right Toe Third o Change dressing every day. Follow-up Appointments Wound #1 Right,Lateral Ankle o Return Appointment in  1 week. Wound #2 Right Malleolus o Return Appointment in 1 week. Wound #3 Right Toe Third o Return Appointment in 1 week. Additional Orders / Instructions Wound #1 Right,Lateral Ankle o Stop Smoking   o Increase protein intake. Wound #2 Right Malleolus o Stop Smoking o Increase protein intake. Wound #3 Right Toe Third o Stop Smoking o Increase protein intake. Notes SNF STAFF TO CALL [facility] AND RESCHEDULE PROCEDURE THAT PATIENT MISSED ON 01/05/17 AND 01/06/17. SNF STAFF NOTIFIED OF THIS PROCEDURE BY [facility] ON 01/01/17 Electronic Signature(s) Signed: 01/06/2017 3:55:06 PM By: Gretta Cool, BSN, RN, CWS, Kim RN, BSN Moss, [place] ([id number]) Signed: 01/06/2017 5:35:48 PM By: Linton Ham MD Entered By: Gretta Cool, BSN, RN, CWS, Kim on 01/06/2017 10:22:26 Schmall, [name] ([id number]) -------------------------------------------------------------------------------- Problem List Details Patient Name: [name] Date of Service: 01/06/2017 9:15 AM Medical Record Number: [id number] Patient Account Number: [id number] Date of Birth/Sex: 1933/07/19 (83 y.o. 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.513 Non-pressure chronic ulcer of other part of right foot with 12/30/2016 Yes necrosis of muscle I70.235 Atherosclerosis of native arteries of right leg with 12/30/2016 Yes ulceration of other part of foot I70.245 Atherosclerosis of native arteries of left leg with ulceration 12/30/2016 Yes of other part of foot Inactive Problems Resolved  Problems Electronic Signature(s) Signed: 01/06/2017 5:35:48 PM By: Linton Ham MD Entered By: Linton Ham on 01/06/2017 11:20:32 Berkey, [name] ([id number]) -------------------------------------------------------------------------------- Progress Note Details Patient Name: [name] Date of Service: 01/06/2017 9:15 AM Medical Record Number: [id number] Patient Account Number: [id number] Date of Birth/Sex: [birth year] (83 y.o. Male) Treating RN: Montey Hora Primary Care Provider: Delight Stare Other Clinician: Referring Provider: Delight Stare Treating Provider/Extender: Tito Dine in Treatment: 1 Subjective Chief Complaint Information obtained from Patient 12/30/16; patient arrives from [place] unaccompanied. He has 3 open wounds on the right foot and 2 threatened areas History of Present Illness (HPI) 12/30/16; this is an 83 year old man who comes from [place] skilled facility. He comes today unaccompanied and without any history. Looking through [facility] link I am able to see that he was admitted to hospital from 5/25 through 12/04/16 at that point with dehydration, severe hyponatremia acute kidney injury. He was treated with IV fluid replacement including 3% normal saline. Nowhere in his discharge summary did I see any reference to wounds on his feet and is mentioned he does not come in with any history. He does come in with a pack of cigarettes therefore I'm assuming he is a smoker. He is not a listed diabetic. There is some reference in his notes that he has a DSS Education officer, museum is his guardian I'm not completely sure if that is true in any case the patient has some degree of dementia although he is able to tell me that he is 83 years old came from [place]. He is not able to give a history of these wounds. Our intake nurse notes maggots in the right third toe. Beyond this he has an area over the right lateral malleolus on the right  lateral foot. He also has to threatened areas on the base of the right fifth metatarsal and on the lateral aspect of the right fifth metatarsal head. We are completely unable to do ABIs in the clinic largely because when we inflated the cuff he seemed to withdraw the foot. 01/05/17; the patient  was seen urgently by Dr. Gilda Crease of vein and vascular. He agreed with our assessment of critical limb ischemia and set him up for an angiogram. Unfortunately the patient was not transported for preoperative evaluation therefore the angiogram has been canceled. I am not exactly sure if and when this is going to be rescheduled. The patient's wounds look about the same, right lateral ankle right lateral foot third toe. There is also concerning areas over the fifth met head and base of the fifth metatarsal which are not open but are covered by a dark eschar logorrhea and painful. We have been using silver alginate and x-ray of the right foot done at the facility showed no definite radiographic evidence of acute fracture or dislocation there was an old avulsion fracture of the proximal phalanx fifth digit as well as old fractures of the base of the proximal phalanx of the fifth digit. There is no comment about osteomyelitis Mungia, [name] ([id number]) Objective Constitutional Sitting or standing Blood Pressure is within target range for patient.. Pulse regular and within target range for patient.Marland Kitchen Respirations regular, non-labored and within target range.. Temperature is normal and within the target range for the patient.Marland Kitchen appears in no distress. Vitals Time Taken: 9:52 AM, Pulse: 84 bpm, Respiratory Rate: 16 breaths/min, Blood Pressure: 119/57 mmHg. Eyes Conjunctivae clear. No discharge. Respiratory Respiratory effort is easy and symmetric bilaterally. Rate is normal at rest and on room air.. Bilateral breath sounds are clear and equal in all lobes with no wheezes, rales or  rhonchi.. Cardiovascular Femoral arteries without bruits and pulses strong. I did not feel popliteal pulses bilaterally. Pedal pulses absent bilaterally.. Gastrointestinal (GI) Abdomen is soft and non-distended without masses or tenderness. Bowel sounds active in all quadrants.. Lymphatic Nonpalpable the popliteal or inguinal area. Psychiatric Patient appears depressed today.. General Notes: Wound exam; the patient has 2 substantial wounds 1 over the lateral malleolus one on the right lateral foot these are full-thickness wounds. Neither one of them looks acutely infected. He also has necrotic and painful area with ongoing ischemia in the right third toe. Finally he also has threatened areas on the right fifth metatarsal head and the base of the right fifth metatarsal on the lateral aspect of both of these areas. These are boggy and tender and I suspect there may be underlying tissue damage here as well. Integumentary (Hair, Skin) Wound #1 status is Open. Original cause of wound was Gradually Appeared. The wound is located on the Right,Lateral Ankle. The wound measures 2.5cm length x 2.7cm width x 0.3cm depth; 5.301cm^2 area and 1.59cm^3 volume. There is Fat Layer (Subcutaneous Tissue) Exposed exposed. There is no tunneling or undermining noted. There is a large amount of serous drainage noted. The wound margin is flat and intact. There is medium (34-66%) red granulation within the wound bed. There is a medium (34-66%) amount of necrotic tissue within the wound bed including Adherent Slough. The periwound skin appearance did not exhibit: Callus, Crepitus, Excoriation, Induration, Rash, Scarring, Dry/Scaly, Maceration, Atrophie Blanche, [place], [place] ([id number]) Cyanosis, Ecchymosis, Hemosiderin Staining, Mottled, Pallor, Rubor, Erythema. Periwound temperature was noted as No Abnormality. The periwound has tenderness on palpation. Wound #2 status is Open. Original cause of wound was  Gradually Appeared. The wound is located on the Right Malleolus. The wound measures 1.9cm length x 1.5cm width x 0.1cm depth; 2.238cm^2 area and 0.224cm^3 volume. There is Fat Layer (Subcutaneous Tissue) Exposed exposed. There is no tunneling or undermining noted. There is a large amount of serous drainage noted.  The wound margin is flat and intact. There is medium (34-66%) red granulation within the wound bed. There is a medium (34-66%) amount of necrotic tissue within the wound bed including Adherent Slough. The periwound skin appearance did not exhibit: Callus, Crepitus, Excoriation, Induration, Rash, Scarring, Dry/Scaly, Maceration, Atrophie Blanche, Cyanosis, Ecchymosis, Hemosiderin Staining, Mottled, Pallor, Rubor, Erythema. Periwound temperature was noted as No Abnormality. The periwound has tenderness on palpation. Wound #3 status is Open. Original cause of wound was Gradually Appeared. The wound is located on the Right Toe Third. The wound measures 2.5cm length x 2.7cm width x 0.1cm depth; 5.301cm^2 area and 0.53cm^3 volume. There is a large amount of purulent drainage noted. The wound margin is flat and intact. There is no granulation within the wound bed. There is a large (67-100%) amount of necrotic tissue within the wound bed including Eschar. The periwound skin appearance did not exhibit: Callus, Crepitus, Excoriation, Induration, Rash, Scarring, Dry/Scaly, Maceration, Atrophie Blanche, Cyanosis, Ecchymosis, Hemosiderin Staining, Mottled, Pallor, Rubor, Erythema. Periwound temperature was noted as No Abnormality. The periwound has tenderness on palpation. 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 May Shower, gently pat wound dry prior to applying new dressing. Wound #2 Right Malleolus: Clean wound with Normal Saline. May Shower, gently pat wound dry prior to applying new dressing. Grosso, [name] ([id number]) Wound #3 Right Toe Third: Clean wound with Normal Saline. May Shower, gently pat wound dry prior to applying new dressing. Anesthetic: Wound #1 Right,Lateral Ankle: Topical Lidocaine 4% cream applied to wound bed prior to debridement Wound #2 Right Malleolus: Topical Lidocaine 4% cream applied to wound bed prior to debridement Wound #3 Right Toe Third: Topical Lidocaine 4% cream applied to wound bed prior to debridement Primary Wound Dressing: Wound #1 Right,Lateral Ankle: Aquacel Ag Wound #2 Right Malleolus: Aquacel Ag Wound #3 Right Toe Third: Aquacel Ag Secondary Dressing: Wound #1 Right,Lateral Ankle: Gauze and Kerlix/Conform Wound #2 Right Malleolus: Gauze and Kerlix/Conform Wound #3 Right Toe Third: Gauze and Kerlix/Conform Dressing Change Frequency: Wound #1 Right,Lateral Ankle: Change dressing every day. Wound #2 Right Malleolus: Change dressing every day. Wound #3 Right Toe Third: Change dressing every day. Follow-up Appointments: Wound #1 Right,Lateral Ankle: Return Appointment in 1 week. Wound #2 Right Malleolus: Return Appointment in 1 week. Wound #3 Right Toe Third: Return Appointment in 1 week. Additional Orders / Instructions: Wound #1 Right,Lateral Ankle: Stop Smoking Increase protein intake. Wound #2 Right Malleolus: Stop Smoking Increase protein intake. Wound #3 Right Toe Third: Stop Smoking Increase protein intake. General Notes: SNF STAFF TO CALL [facility] AND  RESCHEDULE PROCEDURE THAT PATIENT MISSED ON 01/05/17 AND 01/06/17. SNF STAFF NOTIFIED OF THIS Tesmer, MALCOME ([id number]) PROCEDURE BY [employer] ON 01/01/17 #1 we're going to continue with silver alginate all wound areas. He said the instructions sent to [place] where he resides #2 we've asked them to call Dr. Henrine Screws office to reset up the preoperative evaluation as a prelude to a necessary angiogram for his critical limb  ischemia #3 the patient is in a lot of pain with any movement of the foot or leg. Electronic Signature(s) Signed: 01/06/2017 5:35:48 PM By: Linton Ham MD Entered By: Linton Ham on 01/06/2017 11:28:09 Simonet, [name] ([id number]) -------------------------------------------------------------------------------- [place] Details Patient Name: [name] Date of Service: 01/06/2017 Medical Record Number: [id number] Patient Account Number: [id number] Date of Birth/Sex: 1933/12/18 (83 y.o. 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 other part of right foot with necrosis of muscle 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 Facility Procedures CPT4 Code: [id number] Description: 99214 - WOUND CARE VISIT-LEV 4 EST PT Modifier: Quantity: 1 Physician Procedures CPT4: Description Modifier Quantity Code [id number] 99213 - WC PHYS LEVEL 3 - EST PT 1 ICD-10 Description Diagnosis I70.235 Atherosclerosis of native arteries  of right leg with ulceration of other part of foot I70.245 Atherosclerosis of native arteries of  left leg with ulceration of other part of foot Electronic Signature(s) Signed: 01/06/2017 5:35:48 PM By: Linton Ham MD Entered By: Linton Ham on 01/06/2017 11:28:45

## 2017-01-09 ENCOUNTER — Other Ambulatory Visit (INDEPENDENT_AMBULATORY_CARE_PROVIDER_SITE_OTHER): Payer: Self-pay | Admitting: Vascular Surgery

## 2017-01-12 ENCOUNTER — Inpatient Hospital Stay: Admission: RE | Admit: 2017-01-12 | Payer: Medicare HMO | Source: Ambulatory Visit

## 2017-01-12 MED ORDER — CEFAZOLIN SODIUM-DEXTROSE 1-4 GM/50ML-% IV SOLN
1.0000 g | Freq: Once | INTRAVENOUS | Status: AC
  Administered 2017-01-13: 1 g via INTRAVENOUS

## 2017-01-13 ENCOUNTER — Ambulatory Visit: Payer: Medicare HMO | Admitting: Physician Assistant

## 2017-01-13 ENCOUNTER — Encounter
Admission: RE | Disposition: A | Discharge status: Home / Self Care | Payer: Self-pay | Source: Ambulatory Visit | Attending: Vascular Surgery

## 2017-01-13 ENCOUNTER — Encounter: Payer: Self-pay | Admitting: *Deleted

## 2017-01-13 ENCOUNTER — Ambulatory Visit
Admission: RE | Admit: 2017-01-13 | Discharge: 2017-01-13 | Disposition: A | Discharge status: Home / Self Care | Payer: Medicare HMO | Source: Ambulatory Visit | Attending: Vascular Surgery | Admitting: Vascular Surgery

## 2017-01-13 DIAGNOSIS — F17210 Nicotine dependence, cigarettes, uncomplicated: Secondary | ICD-10-CM | POA: Insufficient documentation

## 2017-01-13 DIAGNOSIS — M109 Gout, unspecified: Secondary | ICD-10-CM | POA: Insufficient documentation

## 2017-01-13 DIAGNOSIS — L97913 Non-pressure chronic ulcer of unspecified part of right lower leg with necrosis of muscle: Secondary | ICD-10-CM | POA: Insufficient documentation

## 2017-01-13 DIAGNOSIS — M1990 Unspecified osteoarthritis, unspecified site: Secondary | ICD-10-CM | POA: Diagnosis not present

## 2017-01-13 DIAGNOSIS — Z955 Presence of coronary angioplasty implant and graft: Secondary | ICD-10-CM | POA: Insufficient documentation

## 2017-01-13 DIAGNOSIS — E785 Hyperlipidemia, unspecified: Secondary | ICD-10-CM | POA: Diagnosis not present

## 2017-01-13 DIAGNOSIS — I10 Essential (primary) hypertension: Secondary | ICD-10-CM | POA: Insufficient documentation

## 2017-01-13 DIAGNOSIS — Z888 Allergy status to other drugs, medicaments and biological substances status: Secondary | ICD-10-CM | POA: Insufficient documentation

## 2017-01-13 DIAGNOSIS — I7025 Atherosclerosis of native arteries of other extremities with ulceration: Secondary | ICD-10-CM | POA: Insufficient documentation

## 2017-01-13 DIAGNOSIS — Z8673 Personal history of transient ischemic attack (TIA), and cerebral infarction without residual deficits: Secondary | ICD-10-CM | POA: Insufficient documentation

## 2017-01-13 DIAGNOSIS — I70238 Atherosclerosis of native arteries of right leg with ulceration of other part of lower right leg: Secondary | ICD-10-CM | POA: Diagnosis not present

## 2017-01-13 DIAGNOSIS — Z9889 Other specified postprocedural states: Secondary | ICD-10-CM | POA: Diagnosis not present

## 2017-01-13 HISTORY — PX: LOWER EXTREMITY ANGIOGRAPHY: CATH118251

## 2017-01-13 LAB — CREATININE, SERUM
CREATININE: 0.92 mg/dL (ref 0.61–1.24)
GFR calc Af Amer: >60 mL/min (ref >60)

## 2017-01-13 LAB — BUN: BUN: 25 mg/dL — AB (ref 6–20)

## 2017-01-13 SURGERY — LOWER EXTREMITY ANGIOGRAPHY
Anesthesia: Moderate Sedation | Laterality: Right

## 2017-01-13 MED ORDER — ACETAMINOPHEN 325 MG RE SUPP
325.0000 mg | RECTAL | Status: DC | PRN
  Filled 2017-01-13: qty 2

## 2017-01-13 MED ORDER — SODIUM CHLORIDE 0.9 % IJ SOLN
INTRAMUSCULAR | Status: AC
  Filled 2017-01-13: qty 50

## 2017-01-13 MED ORDER — HYDRALAZINE HCL 20 MG/ML IJ SOLN: 5.0000 mg | INTRAMUSCULAR | Status: DC | PRN

## 2017-01-13 MED ORDER — FENTANYL CITRATE (PF) 100 MCG/2ML IJ SOLN
INTRAMUSCULAR | Status: AC
  Filled 2017-01-13: qty 2

## 2017-01-13 MED ORDER — OXYCODONE HCL 5 MG PO TABS: 5.0000 mg | ORAL_TABLET | ORAL | Status: DC | PRN

## 2017-01-13 MED ORDER — METOPROLOL TARTRATE 5 MG/5ML IV SOLN
2.0000 mg | INTRAVENOUS | Status: DC | PRN
Start: 2017-01-13 — End: 2017-01-13

## 2017-01-13 MED ORDER — ACETAMINOPHEN 325 MG PO TABS: 325.0000 mg | ORAL_TABLET | ORAL | Status: DC | PRN

## 2017-01-13 MED ORDER — HYDROMORPHONE HCL 1 MG/ML IJ SOLN: 0.5000 mg | INTRAMUSCULAR | Status: DC | PRN

## 2017-01-13 MED ORDER — FENTANYL CITRATE (PF) 100 MCG/2ML IJ SOLN
INTRAMUSCULAR | Status: DC | PRN
  Administered 2017-01-13 (×4): 25 ug via INTRAVENOUS
  Administered 2017-01-13: 50 ug via INTRAVENOUS
  Administered 2017-01-13: 25 ug via INTRAVENOUS

## 2017-01-13 MED ORDER — CLOPIDOGREL BISULFATE 75 MG PO TABS: 75.0000 mg | ORAL_TABLET | Freq: Every day | ORAL | 4 refills | Status: DC

## 2017-01-13 MED ORDER — METHYLPREDNISOLONE SODIUM SUCC 125 MG IJ SOLR: 125.0000 mg | INTRAMUSCULAR | Status: DC | PRN

## 2017-01-13 MED ORDER — LABETALOL HCL 5 MG/ML IV SOLN: 10.0000 mg | INTRAVENOUS | Status: DC | PRN

## 2017-01-13 MED ORDER — CEFAZOLIN SODIUM-DEXTROSE 1-4 GM/50ML-% IV SOLN
INTRAVENOUS | Status: AC
  Filled 2017-01-13: qty 50

## 2017-01-13 MED ORDER — SODIUM CHLORIDE 0.9 % IV SOLN
INTRAVENOUS | Status: DC
  Administered 2017-01-13: 11:00:00 via INTRAVENOUS

## 2017-01-13 MED ORDER — HEPARIN (PORCINE) IN NACL 2-0.9 UNIT/ML-% IJ SOLN
INTRAMUSCULAR | Status: AC
  Filled 2017-01-13: qty 1000

## 2017-01-13 MED ORDER — LIDOCAINE HCL (PF) 1 % IJ SOLN
INTRAMUSCULAR | Status: AC
  Filled 2017-01-13: qty 30

## 2017-01-13 MED ORDER — MIDAZOLAM HCL 2 MG/2ML IJ SOLN
INTRAMUSCULAR | Status: DC | PRN
  Administered 2017-01-13 (×7): 1 mg via INTRAVENOUS

## 2017-01-13 MED ORDER — NITROGLYCERIN 5 MG/ML IV SOLN
INTRAVENOUS | Status: AC
  Filled 2017-01-13: qty 10

## 2017-01-13 MED ORDER — SODIUM CHLORIDE 0.9 % IV SOLN: 500.0000 mL | Freq: Once | INTRAVENOUS | Status: DC | PRN

## 2017-01-13 MED ORDER — CLOPIDOGREL BISULFATE 75 MG PO TABS
ORAL_TABLET | ORAL | Status: AC
  Administered 2017-01-13: 300 mg via ORAL
  Filled 2017-01-13: qty 4

## 2017-01-13 MED ORDER — FAMOTIDINE 20 MG PO TABS: 40.0000 mg | ORAL_TABLET | ORAL | Status: DC | PRN

## 2017-01-13 MED ORDER — HEPARIN SODIUM (PORCINE) 1000 UNIT/ML IJ SOLN
INTRAMUSCULAR | Status: DC | PRN
  Administered 2017-01-13: 4000 [IU] via INTRAVENOUS

## 2017-01-13 MED ORDER — SODIUM CHLORIDE 0.9 % IV SOLN: 1.0000 mL/kg/h | INTRAVENOUS | Status: DC

## 2017-01-13 MED ORDER — MIDAZOLAM HCL 5 MG/5ML IJ SOLN
INTRAMUSCULAR | Status: AC
  Filled 2017-01-13: qty 5

## 2017-01-13 MED ORDER — ALUM & MAG HYDROXIDE-SIMETH 200-200-20 MG/5ML PO SUSP: 15.0000 mL | ORAL | Status: DC | PRN

## 2017-01-13 MED ORDER — PHENOL 1.4 % MT LIQD: 1.0000 | OROMUCOSAL | Status: DC | PRN

## 2017-01-13 MED ORDER — NITROGLYCERIN 1 MG/10 ML FOR IR/CATH LAB
INTRA_ARTERIAL | Status: DC | PRN
  Administered 2017-01-13: 750 ug

## 2017-01-13 MED ORDER — CLOPIDOGREL BISULFATE 300 MG PO TABS
300.0000 mg | ORAL_TABLET | ORAL | Status: AC
  Administered 2017-01-13: 300 mg via ORAL

## 2017-01-13 MED ORDER — ONDANSETRON HCL 4 MG/2ML IJ SOLN: 4.0000 mg | Freq: Four times a day (QID) | INTRAMUSCULAR | Status: DC | PRN

## 2017-01-13 MED ORDER — HEPARIN SODIUM (PORCINE) 1000 UNIT/ML IJ SOLN
INTRAMUSCULAR | Status: AC
  Filled 2017-01-13: qty 1

## 2017-01-13 MED ORDER — GUAIFENESIN-DM 100-10 MG/5ML PO SYRP: 15.0000 mL | ORAL_SOLUTION | ORAL | Status: DC | PRN

## 2017-01-13 MED ORDER — HYDROMORPHONE HCL 1 MG/ML IJ SOLN: 1.0000 mg | Freq: Once | INTRAMUSCULAR | Status: DC | PRN

## 2017-01-13 SURGICAL SUPPLY — 25 items
BALLN LUTONIX 4X150X130 (BALLOONS) ×9
BALLN LUTONIX DCB 4X40X130 (BALLOONS) ×3
BALLN ULTRVRSE 3.5X100X150 (BALLOONS) ×3
BALLN ULTRVRSE 3X40X130C (BALLOONS) ×3
BALLOON LUTONIX 4X150X130 (BALLOONS) ×3 IMPLANT
BALLOON LUTONIX DCB 4X40X130 (BALLOONS) ×1 IMPLANT
BALLOON ULTRVRSE 3.5X100X150 (BALLOONS) ×1 IMPLANT
BALLOON ULTRVRSE 3X40X130C (BALLOONS) ×1 IMPLANT
CATH BEACON 5 .035 65 KMP TIP (CATHETERS) ×3 IMPLANT
CATH CROSS S6 106CM (CATHETERS) ×3 IMPLANT
CATH PIG 70CM (CATHETERS) ×3 IMPLANT
DEVICE PRESTO INFLATION (MISCELLANEOUS) ×3 IMPLANT
DEVICE STARCLOSE SE CLOSURE (Vascular Products) ×6 IMPLANT
GLIDEWIRE STIFF .35X180X3 HYDR (WIRE) ×3 IMPLANT
KIT FLOWMATE PROCEDURAL (MISCELLANEOUS) ×3 IMPLANT
NEEDLE ENTRY 21GA 7CM ECHOTIP (NEEDLE) ×3 IMPLANT
PACK ANGIOGRAPHY (CUSTOM PROCEDURE TRAY) ×3 IMPLANT
SET INTRO CAPELLA COAXIAL (SET/KITS/TRAYS/PACK) ×3 IMPLANT
SHEATH BRITE TIP 6FRX11 (SHEATH) ×3 IMPLANT
SHIELD X-DRAPE GOLD 12X17 (MISCELLANEOUS) ×3 IMPLANT
VALVE HEMO TOUHY BORST Y (VALVE) ×3 IMPLANT
WIRE G 018X200 V18 (WIRE) ×3 IMPLANT
WIRE G V18X300CM (WIRE) ×3 IMPLANT
WIRE J 3MM .035X145CM (WIRE) ×3 IMPLANT
WIRE MAGIC TORQUE 260C (WIRE) ×3 IMPLANT

## 2017-01-13 NOTE — H&P (Signed)
[facility] History & Physical Update  The patient was interviewed and re-examined.  The patient's previous History and Physical has been reviewed and is unchanged.  There is no change in the plan of care. We plan to proceed with the scheduled procedure.  Levora DredgeGregory Randi Poullard, MD  01/13/2017, 12:58 PM

## 2017-01-13 NOTE — Op Note (Signed)
[facility] Percutaneous Study/Intervention Procedural Note   Date of Surgery: 01/13/2017  Surgeon:  [name], MD.  Pre-operative Diagnosis: Atherosclerotic occlusive disease bilateral lower extremities with ulceration of the right foot  Post-operative diagnosis: Same  Procedure(s) Performed: 1. Introduction catheter into right lower extremity 3rd order catheter placement  2. Contrast injection right lower extremity for distal runoff   3. Crosser atherectomy of the right popliteal artery 4.  Percutaneous transluminal angioplasty right superficial femoral artery and popliteal arteries to 4 mm with the Lutonix drug-eluting balloon             5.    Percutaneous transluminal angioplasty of the right anterior tibial artery                              6.     Introduction catheter into aorta left common femoral artery approach             7.   Star close closure bilateral common femoral arteriotomy  Anesthesia: Conscious sedation was administered under my direct supervision by the interventional radiology RN. IV Versed plus fentanyl were utilized. Continuous ECG, pulse oximetry and blood pressure was monitored throughout the entire procedure. Conscious sedation was for a total of 134 minutes.  Sheath: 5 French sheath retrograde left common femoral; 6 French sheath antegrade right common femoral  Contrast: 125 cc  Fluoroscopy Time: 14.0 minutes  Indications: [name] Ginzburg Sr. presents with gangrene and ulceration of the right foot. The risks and benefits are reviewed all questions answered patient agrees to proceed.  Procedure: [name] Letourneau Sr. is a 83 y.o. y.o. male who was identified and appropriate procedural time out was performed. The patient was then placed supine on the table and prepped and draped in the usual sterile fashion.   Ultrasound was placed in the sterile sleeve and the left groin  was evaluated the left common femoral artery was echolucent and pulsatile indicating patency.  Image was recorded for the permanent record and under real-time visualization a microneedle was inserted into the common femoral artery microwire followed by a micro-sheath.  A J-wire was then advanced through the micro-sheath and a  5 [place] sheath was then inserted over a J-wire. J-wire was then advanced and a 5 French pigtail catheter was positioned at the level of T12. AP projection of the aorta was then obtained.  Pigtail catheter was repositioned to above the bifurcation and bilateral oblique views of the pelvis was obtained.  After review these images given the large iliac aneurysms and the extreme amount of tortuosity it was quite apparent that crossing the aortic bifurcation was not going to be feasible and therefore I elected to perform antegrade puncture of the right common femoral artery.  Ultrasound was again placed in a sterile sleeve and the common femoral artery on the right evaluated. It was echolucent and pulsatile indicating patency. Image was recorded for the permanent record. Under direct ultrasound visualization 1% lidocaine was treated in soft tissues and a micropuncture needle was used to access the proximal right common femoral artery in an antegrade direction. Microwire was then advanced under fluoroscopic guidance followed by micro-sheath. J-wire and then a 6 French sheath was inserted. Hand injection was then performed and distal runoff was then performed.  5000 units of heparin was then given and allowed to circulate.  Glidewire with a Kumpe catheter was then noted negotiated down to the occlusion where magnified  image was obtained.  The S6 Crosser catheter was then prepped on the field and a angled Usher catheter was advanced into the cul-de-sac of the mid popliteal artery under magnified imaging. Using the Crosser catheter the occlusion of the popliteal was negotiated.  Injection of  contrast confirmed intraluminal positioning.  Kumpe catheter and stiff angle Glidewire were then advanced down into the proximal anterior tibial.  Distal runoff was then completed by hand injection through the catheter.  This represents third order catheter placement from the antegrade approach.  A 3 mm Ultraverse balloon was then used to angioplasty the anterior tibial artery. Inflations were to 10-12 atm for 1 full minute.   A series of 4 mm x 15 cm Lutonix drug-eluting balloons were then used to angioplasty the superficial femoral and popliteal arteries across their entire length. Inflations were to 12 atmospheres for 2 minutes. Follow-up imaging demonstrated patency with less than 15% residual stenosis throughout the SFA and proximal two thirds of the popliteal. Distally the popliteal main with a high-grade stenosis as well as the proximal 2-3 cm of the anterior tibial. Therefore a 4 mm Lutonix balloon was advanced into the anterior tibial by approximately 2-3 cm extending back into the popliteal and inflation to 8 atm for 2 minutes was performed. Follow-up imaging demonstrated the there was diffuse disease that remained in the anterior tibial distal to our Lutonix balloon inflation and a 3.5 mm Ultraverse balloon was advanced down into this area and inflated to 12 atm for 1 minute. Follow-up imaging demonstrated excellent result throughout the SFA popliteal and anterior tibial with some spasm distally and this was treated with an intra-arterial injection of 750 g and nitroglycerin.   After review of these images the Star close device deployed on the right.  Subsequently the J-wire is advanced up the left femoral sheath LAO projection of the left femoral is obtained and a Star close device is deployed on the left as well. There no immediate Complications.  Findings: The abdominal aorta is opacified with a bolus injection contrast. The aorta itself has diffuse disease but no hemodynamically significant  lesions it does appear somewhat enlarged. 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  The right common femoral is widely patent as is the profunda femoris.  The SFA does indeed have a significant stenosis throughout its course with several areas of string sign.  The distal popliteal demonstrates occlusion and the trifurcation is heavily diseased with occlusion of the peroneal and posterior. Anterior tibial demonstrates diffuse disease throughout its course particularly at its origin   Following angioplasty anterior  tibial now is in-line flow and looks quite nice with less than 5% residual stenosis. Angioplasty of the SFA and popliteal yields an excellent result with less than 10% residual stenosis.    Disposition: Patient was taken to the recovery room in stable condition having tolerated the procedure well.  Belenda Cruise [name] 01/13/2017,3:48 PM

## 2017-01-13 NOTE — Progress Notes (Signed)
Patient came  By [facility]  transport to hospital for vascular procedure from [facility].  No one from the care faculty was with the patient. The patient was soaked in urine (the diaper, shorts and foot dressing  And wheelchair was soaked). Dressing on rt foot was changed there are several ulcer areas on rt.  foot and middle toe necrotic.

## 2017-01-13 NOTE — OR Nursing (Signed)
Referral made to [place] [facility] regarding condition in which patient arrived to our facility.  Upon arrival [facility] patient was in a wheelchair that was had  a puddle of urine and a diaper was on the patient that was extremely heavy saturated with urine and his shorts were soaked with urine.  Dressing on his right foot also had urine and some stool on it.  Spoke with Psychologist, educationalrita cooper intake worker at [facility]

## 2017-01-14 ENCOUNTER — Encounter: Payer: Self-pay | Admitting: Vascular Surgery

## 2017-01-16 ENCOUNTER — Encounter: Payer: Medicare HMO | Admitting: Physician Assistant

## 2017-01-16 DIAGNOSIS — I70235 Atherosclerosis of native arteries of right leg with ulceration of other part of foot: Secondary | ICD-10-CM | POA: Diagnosis not present

## 2017-01-18 NOTE — Progress Notes (Signed)
[name] ([id number]) Visit Report for 01/16/2017 Arrival Information Details Patient Name: [name], [name] Date of Service: 01/16/2017 1:30 PM Medical Record Number: [id number] Patient Account Number: [id number] Date of Birth/Sex: [birth year] (83 y.o. Male) Treating RN: Curtis Sites Primary Care Dewarren Ledbetter: Darreld Mclean Other Clinician: Referring Zacary Bauer: Darreld Mclean Treating Dayanara Sherrill/Extender: Linwood Dibbles, HOYT Weeks in Treatment: 2 Visit Information History Since Last Visit Added or deleted any medications: No Patient Arrived: Wheel Chair Any new allergies or adverse reactions: No Arrival Time: 13:41 Had a fall or experienced change in No activities of daily living that may affect Accompanied By: self risk of falls: Transfer Assistance: Manual Signs or symptoms of abuse/neglect since last No Patient Identification Verified: Yes visito Secondary Verification Process Yes Hospitalized since last visit: No Completed: Has Dressing in Place as Prescribed: Yes Pain Present Now: No Electronic Signature(s) Signed: 01/16/2017 4:22:27 PM By: Curtis Sites Entered By: Curtis Sites on 01/16/2017 13:41:44 Brenneman, [name] ([id number]) -------------------------------------------------------------------------------- Encounter Discharge Information Details Patient Name: [name] Date of Service: 01/16/2017 1:30 PM Medical Record Number: [id number] Patient Account Number: [id number] Date of Birth/Sex: [birth year] (83 y.o. Male) Treating RN: Curtis Sites Primary Care Aliene Tamura: Darreld Mclean Other Clinician: Referring Drew Lips: Darreld Mclean Treating Crystin Lechtenberg/Extender: Linwood Dibbles, HOYT Weeks in Treatment: 2 Encounter Discharge Information Items Discharge Pain Level: 0 Discharge Condition: Stable Ambulatory Status: Wheelchair Discharge Destination: Nursing Home Transportation: Private Auto Accompanied By: self Schedule Follow-up Appointment: Yes Medication Reconciliation  completed and provided to Patient/Care No Travonte Byard: Provided on Clinical Summary of Care: 01/16/2017 Form Type Recipient Paper Patient LP Electronic Signature(s) Signed: 01/16/2017 2:49:37 PM By: Curtis Sites Previous Signature: 01/16/2017 2:23:59 PM Version By: Gwenlyn Perking Entered By: Curtis Sites on 01/16/2017 14:49:36 Westhoff, [name] ([id number]) -------------------------------------------------------------------------------- Lower Extremity Assessment Details Patient Name: [name] Date of Service: 01/16/2017 1:30 PM Medical Record Number: [id number] Patient Account Number: [id number] Date of Birth/Sex: 1934-03-10 (83 y.o. Male) Treating RN: Curtis Sites Primary Care Alonah Lineback: Darreld Mclean Other Clinician: Referring Anzleigh Slaven: Darreld Mclean Treating Samarra Ridgely/Extender: STONE III, HOYT Weeks in Treatment: 2 Vascular Assessment Pulses: Dorsalis Pedis Palpable: [Right:No] Doppler Audible: [Right:Inaudible] Posterior Tibial Palpable: [Right:No] Doppler Audible: [Right:Inaudible] Extremity colors, hair growth, and conditions: Hair Growth on Extremity: [Right:No] Temperature of Extremity: [Right:Warm] Capillary Refill: [Right:< 3 seconds] Electronic Signature(s) Signed: 01/16/2017 4:22:27 PM By: Curtis Sites Entered By: Curtis Sites on 01/16/2017 13:57:15 Brentlinger, [name] ([id number]) -------------------------------------------------------------------------------- Multi Wound Chart Details Patient Name: [name] Date of Service: 01/16/2017 1:30 PM Medical Record Number: [id number] Patient Account Number: [id number] Date of Birth/Sex: 11-19-1933 (83 y.o. 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] Comorbid History: [1:Hypertension, Peripheral Hypertension, Peripheral Hypertension, Peripheral Venous Disease, Gout, Osteoarthritis] [2:Venous Disease, Gout, Osteoarthritis] [3:Venous Disease, Gout, Osteoarthritis] Date Acquired: [1:12/04/2016] [2:12/04/2016] [3:12/04/2016] Weeks of Treatment: [1:2] [2:2] [3:2] Wound Status: [1:Open] [2:Open] [3:Open] Pending Amputation on Yes [2:Yes] [3:Yes] Presentation: Measurements L x W x D 2.5x2.6x0.2 [2:2.1x2.5x0.1] [3:1.5x1.5x0.1] (cm) Area (cm) : [1:5.105] [2:4.123] [3:1.767] Volume (cm) : [1:1.021] [2:0.412] [3:0.177] % Reduction in Area: [1:10.70%] [2:19.20%] [3:51.10%] % Reduction in Volume: 40.50% [2:59.60%] [3:75.50%] Classification: [1:Full Thickness With Exposed  Support Structures] [2:Full Thickness With Exposed Support Structures] [3:Full Thickness Without Exposed Support Structures] Exudate Amount: [1:Large] [2:Large] [3:Large] Exudate Type: [1:Serous] [2:Serous] [3:Purulent] Exudate Color: [1:amber] [2:amber] [3:yellow, brown, green] Foul Odor After [1:No] [2:No] [3:Yes] Cleansing: Odor Anticipated Due to N/A [2:N/A] [3:No] Product Use: Wound Margin: [1:Flat and Intact] [2:Flat and Intact] [3:Flat and Intact] Granulation Amount: [1:Medium (34-66%)] [2:Medium (34-66%)] [3:None Present (0%)] Granulation Quality: Red Red N/A Necrotic Amount: Medium (34-66%) Medium (34-66%) Large (67-100%) Necrotic Tissue: Adherent Slough Adherent Slough Eschar Exposed Structures: Fat Layer (Subcutaneous Fat Layer (Subcutaneous Fascia: No Tissue) Exposed:  Yes Tissue) Exposed: Yes Fat Layer (Subcutaneous Fascia: No Fascia: No Tissue) Exposed: No Tendon: No Tendon: No Tendon: No Muscle: No Muscle: No Muscle: No Joint: No Joint: No Joint: No Bone: No Bone: No Bone: No Epithelialization: None None None Periwound Skin Texture: Excoriation: No Excoriation: No Excoriation: No Induration: No Induration: No Induration: No Callus: No Callus: No Callus: No Crepitus: No Crepitus: No Crepitus: No Rash: No Rash: No Rash: No Scarring: No Scarring: No Scarring: No Periwound Skin Maceration: No Maceration: No Maceration: No Moisture: Dry/Scaly: No Dry/Scaly: No Dry/Scaly: No Periwound Skin Color: Atrophie Blanche: No Atrophie Blanche: No Atrophie Blanche: No Cyanosis: No Cyanosis: No Cyanosis: No Ecchymosis: No Ecchymosis: No Ecchymosis: No Erythema: No Erythema: No Erythema: No Hemosiderin Staining: No Hemosiderin Staining: No Hemosiderin Staining: No Mottled: No Mottled: No Mottled: No Pallor: No Pallor: No Pallor: No Rubor: No Rubor: No Rubor: No Temperature: No Abnormality No Abnormality No Abnormality Tenderness on Yes Yes Yes Palpation: Wound Preparation: Ulcer Cleansing: Ulcer Cleansing: Ulcer Cleansing: Rinsed/Irrigated with Rinsed/Irrigated with Rinsed/Irrigated with Saline Saline Saline Topical Anesthetic Topical Anesthetic Topical Anesthetic Applied: Other: lidocaine Applied: Other: lidocaine Applied: Other: lidocaine 4% 4% 4% Wound Number: 4 N/A N/A Photos: No Photos N/A N/A Wound Location: Right Metatarsal head fifth N/A N/A - Plantar Wounding Event: Gradually Appeared N/A N/A Primary Etiology: Arterial Insufficiency Ulcer N/A N/A Comorbid History: Hypertension, Peripheral N/A N/A Venous Disease, Gout, Osteoarthritis Date Acquired: 01/16/2017 N/A N/A Weeks of Treatment: 0 N/A N/A Wound Status: Open N/A N/A Starn, [name] ([id number]) Pending Amputation on No N/A  N/A Presentation: Measurements L x W x D 1.3x1.3x0.1 N/A N/A (cm) Area (cm) : 1.327 N/A N/A Volume (cm) : 0.133 N/A N/A % Reduction in Area: N/A N/A N/A % Reduction in Volume: N/A N/A N/A Classification: Full Thickness Without N/A N/A Exposed Support Structures Exudate Amount: Large N/A N/A Exudate Type: Purulent N/A N/A Exudate Color: yellow, brown, green N/A N/A Foul Odor After Yes N/A N/A Cleansing: Odor Anticipated Due to No N/A N/A Product Use: Wound Margin: Flat and Intact N/A N/A Granulation Amount: None Present (0%) N/A N/A Granulation Quality: N/A N/A N/A Necrotic Amount: Large (67-100%) N/A N/A Necrotic Tissue: Eschar, Adherent Slough N/A N/A Exposed Structures: Fascia: No N/A N/A Fat Layer (Subcutaneous Tissue) Exposed: No Tendon: No Muscle: No Joint: No Bone: No Epithelialization: None N/A N/A Periwound Skin Texture: Excoriation: No N/A N/A Induration: No Callus: No Crepitus: No Rash: No Scarring: No Periwound Skin Maceration: No N/A N/A Moisture: Dry/Scaly: No Periwound Skin Color: Atrophie Blanche: No N/A N/A Cyanosis: No Ecchymosis: No Erythema: No Hemosiderin Staining: No Mottled: No Pallor: No Rubor: No Temperature: No Abnormality N/A N/A Tenderness on Yes N/A N/A Palpation: Picchi, [name] ([id number]) Wound Preparation: Ulcer Cleansing: N/A N/A Rinsed/Irrigated with Saline Topical Anesthetic Applied: Other: lidocaine 4% Treatment Notes Electronic Signature(s) Signed: 01/16/2017 4:22:27 PM By: Curtis Sitesorthy, Joanna Entered By: Curtis Sitesorthy, Joanna on 01/16/2017  14:08:59 Alarid, [name] ([id number]) -------------------------------------------------------------------------------- Multi-Disciplinary Care Plan Details Patient Name: PRIDERaekwan, Spelman Date of Service: 01/16/2017 1:30 PM Medical Record Number: [id number] Patient Account Number: [id number] Date of Birth/Sex: [birth year] (83 y.o. Male) Treating RN: Curtis Sites Primary Care Jelisha Weed:  Darreld Mclean Other Clinician: Referring Aizley Stenseth: Darreld Mclean Treating Mima Cranmore/Extender: Linwood Dibbles, HOYT Weeks in Treatment: 2 Active Inactive ` Abuse / Safety / Falls / Self Care Management Nursing Diagnoses: Potential for falls Goals: Patient will not experience any injury related to falls Date Initiated: 12/30/2016 Target Resolution Date: 02/27/2017 Goal Status: Active Interventions: Assess fall risk on admission and as needed Notes: ` Orientation to the Wound Care Program Nursing Diagnoses: Knowledge deficit related to the wound healing center program Goals: Patient/caregiver will verbalize understanding of the Wound Healing Center Program Date Initiated: 12/30/2016 Target Resolution Date: 02/27/2017 Goal Status: Active Interventions: Provide education on orientation to the wound center Notes: ` Wound/Skin Impairment Nursing Diagnoses: Impaired tissue integrity Lozinski, [name] ([id number]) Goals: Ulcer/skin breakdown will have a volume reduction of 30% by week 4 Date Initiated: 12/30/2016 Target Resolution Date: 02/27/2017 Goal Status: Active Ulcer/skin breakdown will have a volume reduction of 50% by week 8 Date Initiated: 12/30/2016 Target Resolution Date: 02/27/2017 Goal Status: Active Ulcer/skin breakdown will have a volume reduction of 80% by week 12 Date Initiated: 12/30/2016 Target Resolution Date: 02/27/2017 Goal Status: Active Ulcer/skin breakdown will heal within 14 weeks Date Initiated: 12/30/2016 Target Resolution Date: 02/27/2017 Goal Status: Active Interventions: Assess patient/caregiver ability to obtain necessary supplies Assess patient/caregiver ability to perform ulcer/skin care regimen upon admission and as needed Assess ulceration(s) every visit Notes: Electronic Signature(s) Signed: 01/16/2017 4:22:27 PM By: Curtis Sites Entered By: Curtis Sites on 01/16/2017 14:06:50 Sandin, [name]  ([id number]) -------------------------------------------------------------------------------- Pain Assessment Details Patient Name: [name] Date of Service: 01/16/2017 1:30 PM Medical Record Number: [id number] Patient Account Number: [id number] Date of Birth/Sex: [birth year] (83 y.o. Male) Treating RN: Curtis Sites Primary Care Izamar Linden: Darreld Mclean Other Clinician: Referring Jb Dulworth: Darreld Mclean Treating Norelle Runnion/Extender: STONE III, HOYT Weeks in Treatment: 2 Active Problems Location of Pain Severity and Description of Pain Patient Has Paino Yes Site Locations Pain Location: Generalized Pain With Dressing Change: Yes Duration of the Pain. Constant / Intermittento Constant Pain Management and Medication Current Pain Management: Notes Topical or injectable lidocaine is offered to patient for acute pain when surgical debridement is performed. If needed, Patient is instructed to use over the counter pain medication for the following 24-48 hours after debridement. Wound care MDs do not prescribed pain medications. Patient has chronic pain or uncontrolled pain. Patient has been instructed to make an appointment with their Primary Care Physician for pain management. Electronic Signature(s) Signed: 01/16/2017 4:22:27 PM By: Curtis Sites Entered By: Curtis Sites on 01/16/2017 13:42:01 Lebeda, [name] ([id number]) -------------------------------------------------------------------------------- Patient/Caregiver Education Details Patient Name: [name] Date of Service: 01/16/2017 1:30 PM Medical Record Number: [id number] Patient Account Number: [id number] Date of Birth/Gender: [birth year] (83 y.o. Male) Treating RN: Curtis Sites Primary Care Physician: Darreld Mclean Other Clinician: Referring Physician: Darreld Mclean Treating Physician/Extender: Skeet Simmer in Treatment: 2 Education Assessment Education Provided To: Caregiver SNF nurses via written  orders Education Topics Provided Wound/Skin Impairment: Handouts: Other: wound care as ordered Methods: Printed Electronic Signature(s) Signed: 01/16/2017 4:22:27 PM By: Curtis Sites Entered By: Curtis Sites on 01/16/2017 14:50:05 Fatica, [name] ([id number]) -------------------------------------------------------------------------------- Wound Assessment Details Patient Name: [name] Date of Service: 01/16/2017 1:30 PM Medical Record Number: [id number] Patient Account Number: [id number] Date of Birth/Sex: 11/24/1933 (83 y.o. Male)  Treating RN: Curtis Sites Primary Care [place] Basham: Darreld Mclean Other Clinician: Referring Stella Bortle: Darreld Mclean Treating Ranika Mcniel/Extender: STONE III, HOYT Weeks in Treatment: 2 Wound Status Wound Number: 1 Primary Arterial Insufficiency Ulcer Etiology: Wound Location: Right Ankle - Lateral Wound Open Wounding Event: Gradually Appeared Status: Date Acquired: 12/04/2016 Comorbid Hypertension, Peripheral Venous Weeks Of Treatment: 2 History: Disease, Gout, Osteoarthritis Clustered Wound: No Pending Amputation On Presentation Photos Wound Measurements Length: (cm) 2.5 % Reduction in Width: (cm) 2.6 % Reduction in Depth: (cm) 0.2 Epithelializati Area: (cm) 5.105 Tunneling: Volume: (cm) 1.021 Undermining: Area: 10.7% Volume: 40.5% on: None No No Wound Description Full Thickness With Exposed Foul Odor After Classification: Support Structures Slough/Fibrino Wound Margin: Flat and Intact Exudate Large Amount: Exudate Type: Serous Exudate Color: amber Cleansing: No Yes Wound Bed Granulation Amount: Medium (34-66%) Exposed Structure Granulation Quality: Red Fascia Exposed: No Helinski, [name] ([id number]) Necrotic Amount: Medium (34-66%) Fat Layer (Subcutaneous Tissue) Exposed: Yes Necrotic Quality: Adherent Slough Tendon Exposed: No Muscle Exposed: No Joint Exposed: No Bone Exposed: No Periwound Skin Texture Texture  Color No Abnormalities Noted: No No Abnormalities Noted: No Callus: No Atrophie Blanche: No Crepitus: No Cyanosis: No Excoriation: No Ecchymosis: No Induration: No Erythema: No Rash: No Hemosiderin Staining: No Scarring: No Mottled: No Pallor: No Moisture Rubor: No No Abnormalities Noted: No Dry / Scaly: No Temperature / Pain Maceration: No Temperature: No Abnormality Tenderness on Palpation: Yes Wound Preparation Ulcer Cleansing: Rinsed/Irrigated with Saline Topical Anesthetic Applied: Other: lidocaine 4%, Treatment Notes Wound #1 (Right, Lateral Ankle) 1. Cleansed with: Clean wound with Normal Saline 2. Anesthetic Topical Lidocaine 4% cream to wound bed prior to debridement 4. Dressing Applied: Aquacel Ag 5. Secondary Dressing Applied Guaze, ABD and kerlix/Conform 7. Secured with Secretary/administrator) Signed: 01/16/2017 4:17:30 PM By: Curtis Sites Entered By: Curtis Sites on 01/16/2017 16:17:30 Pettis, [name] ([id number]) -------------------------------------------------------------------------------- Wound Assessment Details Patient Name: Cephas, [name] Date of Service: 01/16/2017 1:30 PM Medical Record Number: [id number] Patient Account Number: [id number] Date of Birth/Sex: 05-Jun-1934 (83 y.o. Male) Treating RN: Curtis Sites Primary Care Brannan Cassedy: Darreld Mclean Other Clinician: Referring Rogelio Waynick: Darreld Mclean Treating Tosha Belgarde/Extender: STONE III, HOYT Weeks in Treatment: 2 Wound Status Wound Number: 2 Primary Arterial Insufficiency Ulcer Etiology: Wound Location: Right Malleolus Wound Open Wounding Event: Gradually Appeared Status: Date Acquired: 12/04/2016 Comorbid Hypertension, Peripheral Venous Weeks Of Treatment: 2 History: Disease, Gout, Osteoarthritis Clustered Wound: No Pending Amputation On Presentation Photos Wound Measurements Length: (cm) 2.1 % Reduction in Width: (cm) 2.5 % Reduction in Depth: (cm) 0.1  Epithelializati Area: (cm) 4.123 Tunneling: Volume: (cm) 0.412 Undermining: Area: 19.2% Volume: 59.6% on: None No No Wound Description Full Thickness With Exposed Foul Odor After Classification: Support Structures Slough/Fibrino Wound Margin: Flat and Intact Exudate Large Amount: Exudate Type: Serous Exudate Color: amber Cleansing: No Yes Wound Bed Granulation Amount: Medium (34-66%) Exposed Structure Granulation Quality: Red Fascia Exposed: No Zavada, [name] ([id number]) Necrotic Amount: Medium (34-66%) Fat Layer (Subcutaneous Tissue) Exposed: Yes Necrotic Quality: Adherent Slough Tendon Exposed: No Muscle Exposed: No Joint Exposed: No Bone Exposed: No Periwound Skin Texture Texture Color No Abnormalities Noted: No No Abnormalities Noted: No Callus: No Atrophie Blanche: No Crepitus: No Cyanosis: No Excoriation: No Ecchymosis: No Induration: No Erythema: No Rash: No Hemosiderin Staining: No Scarring: No Mottled: No Pallor: No Moisture Rubor: No No Abnormalities Noted: No Dry / Scaly: No Temperature / Pain Maceration: No Temperature: No Abnormality Tenderness on Palpation: Yes Wound Preparation Ulcer Cleansing: Rinsed/Irrigated with Saline Topical Anesthetic Applied: Other: lidocaine 4%,  Treatment Notes Wound #2 (Right Malleolus) 1. Cleansed with: Clean wound with Normal Saline 2. Anesthetic Topical Lidocaine 4% cream to wound bed prior to debridement 4. Dressing Applied: Aquacel Ag 5. Secondary Dressing Applied Guaze, ABD and kerlix/Conform 7. Secured with Secretary/administrator) Signed: 01/16/2017 4:18:45 PM By: Curtis Sites Entered By: Curtis Sites on 01/16/2017 16:18:45 Dawe, [name] ([id number]) -------------------------------------------------------------------------------- Wound Assessment Details Patient Name: Hodsdon, [name] Date of Service: 01/16/2017 1:30 PM Medical Record Number: [id number] Patient Account Number:  [id number] Date of Birth/Sex: 1933/07/20 (83 y.o. Male) Treating RN: Curtis Sites Primary Care Elion Hocker: Darreld Mclean Other Clinician: Referring Elhadji Pecore: Darreld Mclean Treating Richie Bonanno/Extender: STONE III, HOYT Weeks in Treatment: 2 Wound Status Wound Number: 3 Primary Arterial Insufficiency Ulcer Etiology: Wound Location: Right Toe Third Wound Open Wounding Event: Gradually Appeared Status: Date Acquired: 12/04/2016 Comorbid Hypertension, Peripheral Venous Weeks Of Treatment: 2 History: Disease, Gout, Osteoarthritis Clustered Wound: No Pending Amputation On Presentation Photos Photo Uploaded By: Curtis Sites on 01/16/2017 16:16:32 Wound Measurements Length: (cm) 1.5 % Reduction in Width: (cm) 1.5 % Reduction in Depth: (cm) 0.1 Epithelializati Area: (cm) 1.767 Tunneling: Volume: (cm) 0.177 Undermining: Area: 51.1% Volume: 75.5% on: None No No Wound Description Full Thickness Without Exposed Foul Odor After Classification: Support Structures Due to Product Wound Margin: Flat and Intact Slough/Fibrino Exudate Large Amount: Exudate Type: Purulent Exudate Color: yellow, brown, green Cleansing: Yes Use: No No Wound Bed Granulation Amount: None Present (0%) Exposed Structure Necrotic Amount: Large (67-100%) Fascia Exposed: No Dimartino, [name] ([id number]) Necrotic Quality: Eschar Fat Layer (Subcutaneous Tissue) Exposed: No Tendon Exposed: No Muscle Exposed: No Joint Exposed: No Bone Exposed: No Periwound Skin Texture Texture Color No Abnormalities Noted: No No Abnormalities Noted: No Callus: No Atrophie Blanche: No Crepitus: No Cyanosis: No Excoriation: No Ecchymosis: No Induration: No Erythema: No Rash: No Hemosiderin Staining: No Scarring: No Mottled: No Pallor: No Moisture Rubor: No No Abnormalities Noted: No Dry / Scaly: No Temperature / Pain Maceration: No Temperature: No Abnormality Tenderness on Palpation: Yes Wound  Preparation Ulcer Cleansing: Rinsed/Irrigated with Saline Topical Anesthetic Applied: Other: lidocaine 4%, Treatment Notes Wound #3 (Right Toe Third) 1. Cleansed with: Clean wound with Normal Saline 2. Anesthetic Topical Lidocaine 4% cream to wound bed prior to debridement 4. Dressing Applied: Santyl Ointment 5. Secondary Dressing Applied Guaze, ABD and kerlix/Conform 7. Secured with Secretary/administrator) Signed: 01/16/2017 4:22:27 PM By: Curtis Sites Entered By: Curtis Sites on 01/16/2017 14:08:36 Pikus, [name] ([id number]) -------------------------------------------------------------------------------- Wound Assessment Details Patient Name: Daffern, [name] Date of Service: 01/16/2017 1:30 PM Medical Record Number: [id number] Patient Account Number: [id number] Date of Birth/Sex: 12/13/1933 (83 y.o. Male) Treating RN: Curtis Sites Primary Care Iyona Pehrson: Darreld Mclean Other Clinician: Referring Jacarie Pate: Darreld Mclean Treating Glenetta Kiger/Extender: STONE III, HOYT Weeks in Treatment: 2 Wound Status Wound Number: 4 Primary Arterial Insufficiency Ulcer Etiology: Wound Location: Right Metatarsal head fifth - Plantar Wound Open Status: Wounding Event: Gradually Appeared Comorbid Hypertension, Peripheral Venous Date Acquired: 01/16/2017 History: Disease, Gout, Osteoarthritis Weeks Of Treatment: 0 Clustered Wound: No Photos Photo Uploaded By: Curtis Sites on 01/16/2017 16:16:33 Wound Measurements Length: (cm) 1.3 Width: (cm) 1.3 Depth: (cm) 0.1 Area: (cm) 1.327 Volume: (cm) 0.133 % Reduction in Area: % Reduction in Volume: Epithelialization: None Tunneling: No Undermining: No Wound Description Full Thickness Without Exposed Foul Odor Aft Classification: Support Structures Due to Produc Wound Margin: Flat and Intact Slough/Fibrin Exudate Large Amount: Exudate Type: Purulent Exudate Color: yellow, brown, green er Cleansing: Yes t Use: No o  Yes Wound Bed  Granulation Amount: None Present (0%) Exposed Structure Necrotic Amount: Large (67-100%) Fascia Exposed: No [name], [name] ([id number]) Necrotic Quality: Eschar, Adherent Slough Fat Layer (Subcutaneous Tissue) Exposed: No Tendon Exposed: No Muscle Exposed: No Joint Exposed: No Bone Exposed: No Periwound Skin Texture Texture Color No Abnormalities Noted: No No Abnormalities Noted: No Callus: No Atrophie Blanche: No Crepitus: No Cyanosis: No Excoriation: No Ecchymosis: No Induration: No Erythema: No Rash: No Hemosiderin Staining: No Scarring: No Mottled: No Pallor: No Moisture Rubor: No No Abnormalities Noted: No Dry / Scaly: No Temperature / Pain Maceration: No Temperature: No Abnormality Tenderness on Palpation: Yes Wound Preparation Ulcer Cleansing: Rinsed/Irrigated with Saline Topical Anesthetic Applied: Other: lidocaine 4%, Treatment Notes Wound #4 (Right, Plantar Metatarsal head fifth) 1. Cleansed with: Clean wound with Normal Saline 2. Anesthetic Topical Lidocaine 4% cream to wound bed prior to debridement 4. Dressing Applied: Santyl Ointment 5. Secondary Dressing Applied Guaze, ABD and [employer]. Secured with Secretary/administrator) Signed: 01/16/2017 4:22:27 PM By: Curtis Sites Entered By: Curtis Sites on 01/16/2017 13:58:32 [place], [name] ([id number]) -------------------------------------------------------------------------------- Vitals Details Patient Name: [name] Date of Service: 01/16/2017 1:30 PM Medical Record Number: [id number] Patient Account Number: [id number] Date of Birth/Sex: 02/15/1934 (83 y.o. Male) Treating RN: Curtis Sites Primary Care Datrell Dunton: Darreld Mclean Other Clinician: Referring Charissa Knowles: Darreld Mclean Treating Aran Menning/Extender: STONE III, HOYT Weeks in Treatment: 2 Vital Signs Time Taken: 13:42 Temperature (F): 98.1 Pulse (bpm): 86 Respiratory Rate (breaths/min): 18 Blood  Pressure (mmHg): 114/62 Reference Range: 80 - 120 mg / dl Electronic Signature(s) Signed: 01/16/2017 4:22:27 PM By: Curtis Sites Entered By: Curtis Sites on 01/16/2017 13:43:44

## 2017-01-18 NOTE — Progress Notes (Signed)
[place], [place] ([id number]) Visit Report for 01/16/2017 Chief Complaint Document Details Patient Name: [name], [name] Date of Service: 01/16/2017 1:30 PM Medical Record Number: [id number] Patient Account Number: [id number] Date of Birth/Sex: May 20, 1934 (83 y.o. Male) Treating RN: Montey Hora Primary Care Provider: Delight Stare Other Clinician: Referring Provider: Delight Stare Treating Provider/Extender: Melburn Hake, Maci Eickholt Weeks in Treatment: 2 Information Obtained from: Patient Chief Complaint 12/30/16; patient arrives from [place] unaccompanied. He has 3 open wounds on the right foot and 2 threatened areas Electronic Signature(s) Signed: 01/16/2017 6:13:19 PM By: Worthy Keeler PA-C Entered By: Worthy Keeler on 01/16/2017 14:12:40 Kagan, [name] ([id number]) -------------------------------------------------------------------------------- Debridement Details Patient Name: [name] Date of Service: 01/16/2017 1:30 PM Medical Record Number: [id number] Patient Account Number: [id number] Date of Birth/Sex: 07/14/1933 (83 y.o. Male) Treating RN: Montey Hora Primary Care Provider: Delight Stare Other Clinician: Referring Provider: Delight Stare Treating Provider/Extender: Melburn Hake, Jorryn Casagrande Weeks in Treatment: 2 Debridement Performed for Wound #4 Right,Plantar Metatarsal head fifth Assessment: Performed By: Physician STONE III, Vivi Piccirilli E., PA-C Debridement: Debridement Severity of Tissue Pre Fat layer exposed Debridement: Pre-procedure Verification/Time Out Yes - 14:06 Taken: Start Time: 14:06 Pain Control: Lidocaine 4% Topical Solution Level: Skin/Subcutaneous Tissue Total Area Debrided (L x 1.3 (cm) x 1.3 (cm) = 1.69 (cm) W): Tissue and other Viable, Non-Viable, Eschar, Fibrin/Slough, Subcutaneous material debrided: Instrument: Curette Bleeding: Minimum Hemostasis Achieved: Pressure End Time: 14:08 Procedural Pain: 0 Post Procedural Pain: 0 Response to  Treatment: Procedure was tolerated well Post Debridement Measurements of Total Wound Length: (cm) 1.3 Width: (cm) 1.3 Depth: (cm) 0.2 Volume: (cm) 0.265 Character of Wound/Ulcer Post Improved Debridement: Severity of Tissue Post Debridement: Fat layer exposed Post Procedure Diagnosis Same as Pre-procedure Electronic Signature(s) Signed: 01/16/2017 4:22:27 PM By: Montey Hora Signed: 01/16/2017 6:13:19 PM By: Irean Hong Jerrell, [place] ([id number]) Entered By: Montey Hora on 01/16/2017 14:11:13 Ayub, [name] ([id number]) -------------------------------------------------------------------------------- Debridement Details Patient Name: [name] Date of Service: 01/16/2017 1:30 PM Medical Record Number: [id number] Patient Account Number: [id number] Date of Birth/Sex: [birth year] (83 y.o. Male) Treating RN: Montey Hora Primary Care Provider: Delight Stare Other Clinician: Referring Provider: Delight Stare Treating Provider/Extender: STONE III, Mayes Sangiovanni Weeks in Treatment: 2 Debridement Performed for Wound #3 Right Toe Third Assessment: Performed By: Physician STONE III, Warren Lindahl E., PA-C Debridement: Debridement Severity of Tissue Pre Fat layer exposed Debridement: Pre-procedure Verification/Time Out Yes - 14:08 Taken: Start Time: 14:08 Pain Control: Lidocaine 4% Topical Solution Level: Skin/Subcutaneous Tissue Total Area Debrided (L x 1.5 (cm) x 1.5 (cm) = 2.25 (cm) W): Tissue and other Viable, Non-Viable, Eschar, Fibrin/Slough, Subcutaneous material debrided: Instrument: Curette, Forceps Bleeding: Minimum Hemostasis Achieved: Pressure End Time: 14:10 Procedural Pain: 0 Post Procedural Pain: 0 Response to Treatment: Procedure was tolerated well Post Debridement Measurements of Total Wound Length: (cm) 1.5 Width: (cm) 1.5 Depth: (cm) 0.2 Volume: (cm) 0.353 Character of Wound/Ulcer Post Improved Debridement: Severity of Tissue Post Debridement:  Fat layer exposed Post Procedure Diagnosis Same as Pre-procedure Electronic Signature(s) Signed: 01/16/2017 4:22:27 PM By: Montey Hora Signed: 01/16/2017 6:13:19 PM By: Irean Hong Blackson, [place] ([id number]) Entered By: Montey Hora on 01/16/2017 14:11:54 Szczygiel, [name] ([id number]) -------------------------------------------------------------------------------- HPI Details Patient Name: [name] Date of Service: 01/16/2017 1:30 PM Medical Record Number: [id number] Patient Account Number: [id number] Date of Birth/Sex: 1933-12-23 (82 y.o. Male) Treating RN: Montey Hora Primary Care Provider: Delight Stare Other Clinician: Referring Provider: Delight Stare Treating Provider/Extender: STONE III, Maks Cavallero Weeks in Treatment: 2 History of Present Illness HPI Description:  12/30/16; this is an 83 year old man who comes from [facility] skilled facility. He comes today unaccompanied and without any history. Looking through [facility] link I am able to see that he was admitted to hospital from 5/25 through 12/04/16 at that point with dehydration, severe hyponatremia acute kidney injury. He was treated with IV fluid replacement including 3% normal saline. Nowhere in his discharge summary did I see any reference to wounds on his feet and is mentioned he does not come in with any history. He does come in with a pack of cigarettes therefore I'm assuming he is a smoker. He is not a listed diabetic. There is some reference in his notes that he has a DSS Education officer, [facility] is his guardian I'm not completely sure if that is true in any case the patient has some degree of dementia although he is able to tell me that he is 83 years old came from [place]. He is not able to give a history of these wounds. Our intake nurse notes maggots in the right third toe. Beyond this he has an area over the right lateral malleolus on the right lateral foot. He also has to threatened areas on the  base of the right fifth metatarsal and on the lateral aspect of the right fifth metatarsal head. We are completely unable to do ABIs in the clinic largely because when we inflated the cuff he seemed to withdraw the foot. 01/05/17; the patient was seen urgently by Dr. Delana Meyer of vein and vascular. He agreed with our assessment of critical limb ischemia and set him up for an angiogram. Unfortunately the patient was not transported for preoperative evaluation therefore the angiogram has been canceled. I am not exactly sure if and when this is going to be rescheduled. The patient's wounds look about the same, right lateral ankle right lateral foot third toe. There is also concerning areas over the fifth met head and base of the fifth metatarsal which are not open but are covered by a dark eschar logorrhea and painful. We have been using silver alginate and x-ray of the right foot done at the facility showed no definite radiographic evidence of acute fracture or dislocation there was an old avulsion fracture of the proximal phalanx fifth digit as well as old fractures of the base of the proximal phalanx of the fifth digit. There is no comment about osteomyelitis 01/16/17 I have patient's record from his operative report from [facility] with Dr. Delana Meyer were fortunately patient underwent a successful angioplasty in regard to his right lower extremity. The anterior tibial in-line flow was improved with less than 5% residual stenosis. Angioplasty of the SFA and popliteal arteries also yielded excellent results in post procedure testing with less than 10% residual stenosis. Overall patient appears to have done very well with this procedure. Hopefully this will translate into better wound healing in regard to his right lower extremity wounds. Electronic Signature(s) Signed: 01/16/2017 6:13:19 PM By: Worthy Keeler PA-C Entered By: Worthy Keeler on 01/16/2017 14:16:25 Supan, [name]  ([id number]) Whitsell, [name] ([id number]) -------------------------------------------------------------------------------- Physical Exam Details Patient Name: [name] Date of Service: 01/16/2017 1:30 PM Medical Record Number: [id number] Patient Account Number: [id number] Date of Birth/Sex: 1933/11/10 (83 y.o. Male) Treating RN: Montey Hora Primary Care Provider: Delight Stare Other Clinician: Referring Provider: Delight Stare Treating Provider/Extender: STONE III, Alysabeth Scalia Weeks in Treatment: 2 Constitutional Well-nourished and well-hydrated in no acute distress. Respiratory normal breathing without difficulty. Cardiovascular regular rate and rhythm with  normal S1, S2. Psychiatric this patient is able to make decisions and demonstrates good insight into disease process. Alert and Oriented x 3. pleasant and cooperative. Notes Patient's right lateral ankle wounds appeared to be doing much better with a good granular bed. The right fifth metatarsal and right second toe wounds were covered with eschar and Slough and did require some debridement although having difficulty due to pain and so I was only able to remove some of what was needed to begin with although I think this is a good start. I do believe that with initiation of Santyl that these wounds will have a better chance of clearing off to a good wound bed and healing. Electronic Signature(s) Signed: 01/16/2017 6:13:19 PM By: Worthy Keeler PA-C Entered By: Worthy Keeler on 01/16/2017 14:18:16 Rubiano, [name] ([id number]) -------------------------------------------------------------------------------- Physician Orders Details Patient Name: [name] Date of Service: 01/16/2017 1:30 PM Medical Record Number: [id number] Patient Account Number: [id number] Date of Birth/Sex: 01-Jun-1934 (83 y.o. 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 o May Shower, gently pat wound dry prior to applying new dressing. Wound #2 Right Malleolus o Clean wound with Normal Saline. o May Shower, gently pat wound dry prior to applying new dressing. Wound #3 Right Toe Third o Clean wound with Normal Saline. o May Shower, gently pat wound dry prior to applying new dressing. Wound #4 Right,Plantar Metatarsal head fifth o Clean wound with Normal Saline. o May Shower, gently pat wound dry prior to applying new dressing. Anesthetic Wound #1 Right,Lateral Ankle o Topical Lidocaine 4% cream applied to wound bed prior to debridement Wound #2 Right Malleolus o Topical Lidocaine 4% cream applied to wound bed prior to debridement Wound #3 Right Toe Third o Topical Lidocaine 4% cream applied to wound bed prior to debridement Wound #4 Right,Plantar Metatarsal head fifth Gerstner, [name] ([id number]) o Topical Lidocaine 4% cream applied to wound bed prior to debridement Primary Wound Dressing Wound #1 Right,Lateral Ankle o Aquacel Ag Wound #2 Right Malleolus o Aquacel Ag Wound #3 Right Toe Third o Santyl Ointment Wound #4 Right,Plantar Metatarsal head fifth o Santyl Ointment Secondary Dressing Wound #1 Right,Lateral Ankle o Gauze and Kerlix/Conform Wound #2 Right  Malleolus o Gauze and Kerlix/Conform Wound #3 Right Toe Third o Gauze and Kerlix/Conform Wound #4 Right,Plantar Metatarsal head fifth o Gauze and Kerlix/Conform Dressing Change Frequency Wound #1 Right,Lateral Ankle o Change dressing every day. Wound #2 Right Malleolus o Change dressing every day. Wound #3 Right Toe Third o Change dressing every day. Wound #4 Right,Plantar Metatarsal head fifth o Change dressing every day. Follow-up Appointments Wound #1 Right,Lateral Ankle o Return Appointment in 1 week. Wound #2 Right Malleolus o Return Appointment in 1 week. Herber, [name] ([id number]) Wound #3 Right Toe Third o Return Appointment in 1 week. Wound #4 Right,Plantar Metatarsal head fifth o Return Appointment in  1 week. Additional Orders / Instructions Wound #1 Right,Lateral Ankle o Stop Smoking o Increase protein intake. Wound #2 Right Malleolus o Stop Smoking o Increase protein intake. Wound #3 Right Toe Third o Stop Smoking o Increase protein intake. Wound #4 Right,Plantar Metatarsal head fifth o Stop Smoking o Increase protein intake. Medications-please add to medication list. Wound #3 Right Toe Third o Santyl Enzymatic Ointment Wound #4 Right,Plantar Metatarsal head fifth o Santyl Enzymatic Ointment Notes I'm going to initiate Santyl treatment for the right toe wound as well as the right fifth metatarsal wound. We will continue with the silver alginate for the other wounds and the current wound care orders per above. We will see him for reevaluation in one week to see were things stand at that point. If anything worsens in the interim patient will contact our office for additional recommendations. Electronic Signature(s) Signed: 01/16/2017 6:13:19 PM By: Worthy Keeler PA-C Entered By: Worthy Keeler on 01/16/2017 14:19:03 Brener, [name]  ([id number]) -------------------------------------------------------------------------------- Problem List Details Patient Name: Dunaway, [name] Date of Service: 01/16/2017 1:30 PM Medical Record Number: [id number] Patient Account Number: [id number] Date of Birth/Sex: [birth year] (83 y.o. 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 12/30/2016 Yes necrosis of muscle I70.235 Atherosclerosis of native arteries of right leg with 12/30/2016 Yes ulceration of other part of foot I70.245 Atherosclerosis of native arteries of left leg with ulceration 12/30/2016 Yes of other part of foot Inactive Problems Resolved Problems Electronic Signature(s) Signed: 01/16/2017 6:13:19 PM By: Worthy Keeler PA-C Entered By: Worthy Keeler on 01/16/2017 14:12:21 Gropp, [name] ([id number]) -------------------------------------------------------------------------------- Progress Note Details Patient Name: [name] Date of Service: 01/16/2017 1:30 PM Medical Record Number: [id number] Patient Account Number: [id number] Date of Birth/Sex: 03-11-1934 (83 y.o. Male) Treating RN: Montey Hora Primary Care Provider: Delight Stare Other Clinician: Referring Provider: Delight Stare Treating Provider/Extender: Melburn Hake, Korina Tretter Weeks in Treatment: 2 Subjective Chief Complaint Information obtained from Patient 12/30/16; patient arrives from [place] unaccompanied. He has 3 open wounds on the right foot and 2 threatened areas History  of Present Illness (HPI) 12/30/16; this is an 83 year old man who comes from [facility] skilled facility. He comes today unaccompanied and without any history. Looking through [facility] link I am able to see that he was admitted to hospital from 5/25 through 12/04/16 at that point with dehydration, severe hyponatremia acute kidney injury. He was treated with IV fluid replacement including 3% normal saline. Nowhere in his discharge summary did I see any reference to wounds on his feet and is mentioned he does not come in with any history. He does come in with a pack of cigarettes therefore I'm assuming he is a smoker. He is not a listed diabetic. There is some reference in his notes that he has a DSS Education officer, [employer] is his guardian I'm not completely sure if that is true in any case the patient has some degree of dementia although he is able to tell me that he is 83 years old came from [place]. He is not able to give a history of these wounds. Our intake nurse  notes maggots in the right third toe. Beyond this he has an area over the right lateral malleolus on the right lateral foot. He also has to threatened areas on the base of the right fifth metatarsal and on the lateral aspect of the right fifth metatarsal head. We are completely unable to do ABIs in the clinic largely because when we inflated the cuff he seemed to withdraw the foot. 01/05/17; the patient was seen urgently by Dr. Delana Meyer of vein and vascular. He agreed with our assessment of critical limb ischemia and set him up for an angiogram. Unfortunately the patient was not transported for preoperative evaluation therefore the angiogram has been canceled. I am not exactly sure if and when this is going to be rescheduled. The patient's wounds look about the same, right lateral ankle right lateral foot third toe. There is also concerning areas over the fifth met head and base of the fifth metatarsal which are not open but are  covered by a dark eschar logorrhea and painful. We have been using silver alginate and x-ray of the right foot done at the facility showed no definite radiographic evidence of acute fracture or dislocation there was an old avulsion fracture of the proximal phalanx fifth digit as well as old fractures of the base of the proximal phalanx of the fifth digit. There is no comment about osteomyelitis 01/16/17 I have patient's record from his operative report from [facility] with Dr. Delana Meyer were fortunately patient underwent a successful angioplasty in regard to his right lower extremity. The anterior tibial in-line flow was improved with less than 5% residual stenosis. Angioplasty of the SFA and popliteal arteries also yielded excellent results in post procedure testing with less than 10% residual Gancarz, [name] ([id number]) stenosis. Overall patient appears to have done very well with this procedure. Hopefully this will translate into better wound healing in regard to his right lower extremity wounds. Objective Constitutional Well-nourished and well-hydrated in no acute distress. Vitals Time Taken: 1:42 PM, Temperature: 98.1 F, Pulse: 86 bpm, Respiratory Rate: 18 breaths/min, Blood Pressure: 114/62 mmHg. Respiratory normal breathing without difficulty. Cardiovascular regular rate and rhythm with normal S1, S2. Psychiatric this patient is able to make decisions and demonstrates good insight into disease process. Alert and Oriented x 3. pleasant and cooperative. General Notes: Patient's right lateral ankle wounds appeared to be doing much better with a good granular bed. The right fifth metatarsal and right second toe wounds were covered with eschar and Slough and did require some debridement although having difficulty due to pain and so I was only able to remove some of what was needed to begin with although I think this is a good start. I do believe that with initiation of  Santyl that these wounds will have a better chance of clearing off to a good wound bed and healing. Integumentary (Hair, Skin) Wound #1 status is Open. Original cause of wound was Gradually Appeared. The wound is located on the Right,Lateral Ankle. The wound measures 2.5cm length x 2.6cm width x 0.2cm depth; 5.105cm^2 area and 1.021cm^3 volume. There is Fat Layer (Subcutaneous Tissue) Exposed exposed. There is no tunneling or undermining noted. There is a large amount of serous drainage noted. The wound margin is flat and intact. There is medium (34-66%) red granulation within the wound bed. There is a medium (34-66%) amount of necrotic tissue within the wound bed including Adherent Slough. The periwound skin appearance did not exhibit: Callus, Crepitus, Excoriation, Induration, Rash, Scarring, Dry/Scaly, Maceration,  Atrophie Blanche, Cyanosis, Ecchymosis, Hemosiderin Staining, Mottled, Pallor, Rubor, Erythema. Periwound temperature was noted as No Abnormality. The periwound has tenderness on palpation. Wound #2 status is Open. Original cause of wound was Gradually Appeared. The wound is located on the Right Malleolus. The wound measures 2.1cm length x 2.5cm width x 0.1cm depth; 4.123cm^2 area and Motyka, Kerrion ([id number]) 0.412cm^3 volume. There is Fat Layer (Subcutaneous Tissue) Exposed exposed. There is no tunneling or undermining noted. There is a large amount of serous drainage noted. The wound margin is flat and intact. There is medium (34-66%) red granulation within the wound bed. There is a medium (34-66%) amount of necrotic tissue within the wound bed including Adherent Slough. The periwound skin appearance did not exhibit: Callus, Crepitus, Excoriation, Induration, Rash, Scarring, Dry/Scaly, Maceration, Atrophie Blanche, Cyanosis, Ecchymosis, Hemosiderin Staining, Mottled, Pallor, Rubor, Erythema. Periwound temperature was noted as No Abnormality. The periwound has tenderness on  palpation. Wound #3 status is Open. Original cause of wound was Gradually Appeared. The wound is located on the Right Toe Third. The wound measures 1.5cm length x 1.5cm width x 0.1cm depth; 1.767cm^2 area and 0.177cm^3 volume. There is no tunneling or undermining noted. There is a large amount of purulent drainage noted. The wound margin is flat and intact. There is no granulation within the wound bed. There is a large (67-100%) amount of necrotic tissue within the wound bed including Eschar. The periwound skin appearance did not exhibit: Callus, Crepitus, Excoriation, Induration, Rash, Scarring, Dry/Scaly, Maceration, Atrophie Blanche, Cyanosis, Ecchymosis, Hemosiderin Staining, Mottled, Pallor, Rubor, Erythema. Periwound temperature was noted as No Abnormality. The periwound has tenderness on palpation. Wound #4 status is Open. Original cause of wound was Gradually Appeared. The wound is located on the Right,Plantar Metatarsal head fifth. The wound measures 1.3cm length x 1.3cm width x 0.1cm depth; 1.327cm^2 area and 0.133cm^3 volume. There is no tunneling or undermining noted. There is a large amount of purulent drainage noted. The wound margin is flat and intact. There is no granulation within the wound bed. There is a large (67-100%) amount of necrotic tissue within the wound bed including Eschar and Adherent Slough. The periwound skin appearance did not exhibit: Callus, Crepitus, Excoriation, Induration, Rash, Scarring, Dry/Scaly, Maceration, Atrophie Blanche, Cyanosis, Ecchymosis, Hemosiderin Staining, Mottled, Pallor, Rubor, Erythema. Periwound temperature was noted as No Abnormality. The periwound has tenderness on palpation. 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 There was a Skin/Subcutaneous Tissue Debridement ([id number]) debridement with total area of 2.25 sq cm performed by STONE III, Noel Henandez E., PA-C. with the following instrument(s): Curette and Forceps to remove Viable and Non-Viable tissue/material including Fibrin/Slough, Eschar, and Subcutaneous after achieving pain control using Lidocaine 4% Topical Solution. A time out was conducted at 14:08, prior to the start of the procedure. A Minimum amount of bleeding was controlled with Pressure. The procedure was tolerated well with a pain level of 0 throughout and  a pain level of 0 following the procedure. Post Debridement Measurements: 1.5cm length x 1.5cm width x 0.2cm depth; 0.353cm^3 volume. Character of Wound/Ulcer Post Debridement is improved. Severity of Tissue Post Debridement is: Fat layer exposed. 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 There was a Skin/Subcutaneous Tissue Debridement ([id number]) debridement with total area of 1.69 sq cm performed by STONE III, Carron Mcmurry E., PA-C. with the following instrument(s): Curette to remove Viable and Non-Viable tissue/material including Fibrin/Slough, Eschar, and Subcutaneous after achieving pain control using Lidocaine 4% Topical Solution. A time out was conducted at 14:06, prior to the start of the procedure. A Minimum  amount of bleeding was controlled with Pressure. The procedure was tolerated well with a pain level of 0 throughout and a pain level of 0 following the procedure. Post Debridement Measurements: 1.3cm length x 1.3cm width x 0.2cm depth; 0.265cm^3 volume. Character of Wound/Ulcer Post Debridement is improved. Severity of Tissue Post Debridement is: Fat layer exposed. Post procedure Diagnosis Wound #4: Same as Pre-Procedure Plan Wound Cleansing: Wound #1 Right,Lateral Ankle: Clean wound with Normal Saline. May Shower, gently pat wound dry prior to applying new dressing. Wound #2 Right Malleolus: Clean wound with Normal Saline. May Shower, gently pat wound dry prior to applying new dressing. Wound #3 Right Toe Third: Clean wound with Normal Saline. May Shower, gently pat wound dry prior to applying new dressing. Wound #4 Right,Plantar Metatarsal head fifth: Clean wound with Normal Saline. May Shower, gently pat wound dry prior to applying new dressing. Anesthetic: Wound #1 Right,Lateral Ankle: Topical Lidocaine 4% cream applied to wound bed prior to debridement Diiorio, [name] ([id number]) Wound #2 Right Malleolus: Topical Lidocaine 4% cream applied to wound bed prior to debridement Wound #3 Right Toe Third: Topical Lidocaine 4% cream applied to wound bed prior to debridement Wound #4 Right,Plantar Metatarsal head fifth: Topical Lidocaine 4% cream applied to wound bed prior to debridement Primary Wound Dressing: Wound #1 Right,Lateral Ankle: Aquacel Ag Wound #2 Right Malleolus: Aquacel Ag Wound #3 Right Toe Third: Santyl Ointment Wound #4 Right,Plantar Metatarsal head fifth: Santyl Ointment Secondary Dressing: Wound #1 Right,Lateral Ankle: Gauze and Kerlix/Conform Wound #2 Right Malleolus: Gauze and Kerlix/Conform Wound #3 Right Toe Third: Gauze and Kerlix/Conform Wound #4 Right,Plantar Metatarsal head fifth: Gauze and Kerlix/Conform Dressing Change Frequency: Wound #1  Right,Lateral Ankle: Change dressing every day. Wound #2 Right Malleolus: Change dressing every day. Wound #3 Right Toe Third: Change dressing every day. Wound #4 Right,Plantar Metatarsal head fifth: Change dressing every day. Follow-up Appointments: Wound #1 Right,Lateral Ankle: Return Appointment in 1 week. Wound #2 Right Malleolus: Return Appointment in 1 week. Wound #3 Right Toe Third: Return Appointment in 1 week. Wound #4 Right,Plantar Metatarsal head fifth: Return Appointment in 1 week. Additional Orders / Instructions: Wound #1 Right,Lateral Ankle: Stop Smoking Increase protein intake. Wound #2 Right Malleolus: Stop Smoking Increase protein intake. Wound #3 Right Toe Third: Stop Smoking Stribling, [name] ([id number]) Increase protein intake. Wound #4 Right,Plantar Metatarsal head fifth: Stop Smoking Increase protein intake. Medications-please add to medication list.: Wound #3 Right Toe Third: Santyl Enzymatic Ointment Wound #4 Right,Plantar Metatarsal head fifth: Santyl Enzymatic Ointment General Notes: I'm going to initiate Santyl treatment for the right toe wound as well as the right fifth metatarsal wound. We will continue with the silver alginate for the other wounds and the  current wound care orders per above. We will see him for reevaluation in one week to see were things stand at that point. If anything worsens in the interim patient will contact our office for additional recommendations. Electronic Signature(s) Signed: 01/16/2017 6:13:19 PM By: Worthy Keeler PA-C Entered By: Worthy Keeler on 01/16/2017 14:19:19 Oldaker, [name] ([id number]) -------------------------------------------------------------------------------- SuperBill Details Patient Name: [name] Date of Service: 01/16/2017 Medical Record Number: [id number] Patient Account Number: [id number] Date of Birth/Sex: 24-Nov-1933 (83 y.o. 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 of other part of right foot with necrosis of muscle 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 Facility Procedures CPT4: Description Modifier Quantity Code [id number] 11042 - DEB SUBQ TISSUE 20 SQ CM/< 1 ICD-10 Description Diagnosis L97.513 Non-pressure chronic ulcer of other part of right foot with necrosis of muscle Physician Procedures CPT4: Description Modifier Quantity Code 3291916 60600 - WC PHYS SUBQ TISS 20 SQ CM 1 ICD-10 Description Diagnosis L97.513 Non-pressure chronic ulcer of other part of right foot with necrosis of muscle Electronic Signature(s) Signed: 01/16/2017 6:13:19 PM By: Worthy Keeler PA-C Entered By: Worthy Keeler on 01/16/2017 14:19:45

## 2017-01-22 ENCOUNTER — Encounter: Payer: Medicare HMO | Admitting: Surgery

## 2017-01-22 DIAGNOSIS — I70235 Atherosclerosis of native arteries of right leg with ulceration of other part of foot: Secondary | ICD-10-CM | POA: Diagnosis not present

## 2017-01-23 NOTE — Progress Notes (Signed)
Klepacki, [place] ([id number]) Visit Report for 01/22/2017 Arrival Information Details Patient Name: [name], [name] Date of Service: 01/22/2017 9:15 AM Medical Record Number: [id number] Patient Account Number: [id number] Date of Birth/Sex: 1933/11/15 (83 y.o. Male) Treating RN: Curtis Sites Primary Care Blakeley Margraf: Darreld Mclean Other Clinician: Referring Kamaiya Antilla: Darreld Mclean Treating Said Rueb/Extender: Rudene Re in Treatment: 3 Visit Information History Since Last Visit Added or deleted any medications: No Patient Arrived: Wheel Chair Any new allergies or adverse reactions: No Arrival Time: 09:18 Had a fall or experienced change in No activities of daily living that may affect Accompanied By: self risk of falls: Transfer Assistance: Manual Signs or symptoms of abuse/neglect since last No Patient Identification Verified: Yes visito Secondary Verification Process Yes Hospitalized since last visit: No Completed: Has Dressing in Place as Prescribed: Yes Pain Present Now: No Electronic Signature(s) Signed: 01/22/2017 5:08:01 PM By: Curtis Sites Entered By: Curtis Sites on 01/22/2017 09:19:36 Pettway, [name] ([id number]) -------------------------------------------------------------------------------- Encounter Discharge Information Details Patient Name: [name] Date of Service: 01/22/2017 9:15 AM Medical Record Number: [id number] Patient Account Number: [id number] Date of Birth/Sex: 04-Dec-1933 (83 y.o. Male) Treating RN: Curtis Sites Primary Care Bhavya Grand: Darreld Mclean Other Clinician: Referring Mikeyla Music: Darreld Mclean Treating Alisyn Lequire/Extender: Rudene Re in Treatment: 3 Encounter Discharge Information Items Discharge Pain Level: 0 Discharge Condition: Stable Ambulatory Status: Wheelchair Discharge Destination: Nursing Home Transportation: Private Auto Accompanied By: self Schedule Follow-up Appointment: Yes Medication Reconciliation completed and  provided to Patient/Care No Aanyah Loa: Provided on Clinical Summary of Care: 01/22/2017 Form Type Recipient Paper Patient LP Electronic Signature(s) Signed: 01/22/2017 9:58:56 AM By: Gwenlyn Perking Previous Signature: 01/22/2017 9:33:13 AM Version By: Curtis Sites Entered By: Gwenlyn Perking on 01/22/2017 09:58:56 Nachreiner, [name] ([id number]) -------------------------------------------------------------------------------- Lower Extremity Assessment Details Patient Name: [name] Date of Service: 01/22/2017 9:15 AM Medical Record Number: [id number] Patient Account Number: [id number] Date of Birth/Sex: 08-06-1933 (83 y.o. Male) Treating RN: Curtis Sites Primary Care Krikor Willet: Darreld Mclean Other Clinician: Referring Daevon Holdren: Darreld Mclean Treating Greenlee Ancheta/Extender: Rudene Re in Treatment: 3 Vascular Assessment Pulses: Dorsalis Pedis Palpable: [Right:No] Posterior Tibial Palpable: [Right:No] Extremity colors, hair growth, and conditions: Hair Growth on Extremity: [Right:No] Temperature of Extremity: [Right:Warm] Capillary Refill: [Right:< 3 seconds] Electronic Signature(s) Signed: 01/22/2017 9:32:34 AM By: Curtis Sites Entered By: Curtis Sites on 01/22/2017 09:32:33 Thedford, [name] ([id number]) -------------------------------------------------------------------------------- Multi Wound Chart Details Patient Name: [name] Date of Service: 01/22/2017 9:15 AM Medical Record Number: [id number] Patient Account Number: [id number] Date of Birth/Sex: [birth year] (83 y.o. 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] Comorbid History: [1:Hypertension, Peripheral Hypertension, Peripheral Hypertension, Peripheral Venous Disease, Gout, Osteoarthritis] [2:Venous Disease, Gout, Osteoarthritis] [3:Venous Disease, Gout, Osteoarthritis] Date Acquired: [1:12/04/2016] [2:12/04/2016] [3:12/04/2016] Weeks of Treatment: [1:3] [2:3] [3:3] Wound Status: [1:Open] [2:Open] [3:Open] Pending Amputation on Yes [2:Yes] [3:Yes] Presentation: Measurements L x W x D 2x2.6x0.1 [2:1.8x1.6x0.1] [3:1x1.5x0.1] (cm) Area (cm) : [1:4.084] [2:2.262] [3:1.178] Volume (cm) : [1:0.408] [2:0.226] [3:0.118] % Reduction in Area: [1:28.60%] [2:55.70%] [3:67.40%] % Reduction in Volume: 76.20% [2:77.90%] [3:83.70%] Classification: [1:Full Thickness With Exposed Support Structures] [2:Full Thickness With Exposed Support Structures] [3:Full Thickness Without  Exposed Support Structures] Exudate Amount: [1:Large] [2:Large] [3:Large] Exudate Type: [1:Serous] [2:Serous] [3:Purulent] Exudate Color: [1:amber] [2:amber] [3:yellow, brown, green] Foul Odor After [1:No] [2:No] [3:Yes] Cleansing: Odor Anticipated Due to N/A [2:N/A] [3:No] Product Use: Wound Margin: [1:Flat and Intact] [2:Flat and Intact] [3:Flat and Intact] Granulation Amount: [1:Large (67-100%)] [2:Large (67-100%)] [3:None Present (0%)] Granulation Quality: Red Red N/A Necrotic Amount: Small (1-33%) Small (1-33%) Large (67-100%) Necrotic Tissue: Adherent Slough Adherent Slough Eschar Exposed Structures: Fat Layer (Subcutaneous Fat Layer (Subcutaneous Fascia: No Tissue) Exposed: Yes Tissue) Exposed: Yes Fat Layer (Subcutaneous Fascia: No Fascia: No Tissue) Exposed: No Tendon: No Tendon: No Tendon:  No Muscle: No Muscle: No Muscle: No Joint: No Joint: No Joint: No Bone: No Bone: No Bone: No Epithelialization: None None None Debridement: N/A Debridement ([id number] N/A 11047) Pre-procedure N/A 09:37 N/A Verification/Time Out Taken: Pain Control: N/A Lidocaine 4% Topical N/A Solution Tissue Debrided: N/A Fibrin/Slough, N/A Subcutaneous Level: N/A Skin/Subcutaneous N/A Tissue Debridement Area (sq N/A 2.88 N/A cm): Instrument: N/A Curette N/A Bleeding: N/A Minimum N/A Hemostasis Achieved: N/A Pressure N/A Procedural Pain: N/A 0 N/A Post Procedural Pain: N/A 0 N/A Debridement Treatment N/A Procedure was tolerated N/A Response: well Post Debridement N/A 1.8x1.6x0.1 N/A Measurements L x W x D (cm) Post Debridement N/A 0.226 N/A Volume: (cm) Periwound Skin Texture: Excoriation: No Excoriation: No Excoriation: No Induration: No Induration: No Induration: No Callus: No Callus: No Callus: No Crepitus: No Crepitus: No Crepitus: No Rash: No Rash: No Rash: No Scarring: No Scarring: No Scarring: No Periwound Skin Maceration: No Maceration: No Maceration: No Moisture: Dry/Scaly: No Dry/Scaly: No Dry/Scaly: No Periwound Skin Color: Atrophie Blanche: No Atrophie Blanche: No Atrophie Blanche: No Cyanosis: No Cyanosis: No Cyanosis: No Ecchymosis: No Ecchymosis: No Ecchymosis: No Erythema: No Erythema: No Erythema: No Hemosiderin Staining: No Hemosiderin Staining: No Hemosiderin Staining: No Mottled: No Mottled: No Mottled: No Croker, [name] ([id number]) Pallor: No Pallor: No Pallor: No Rubor: No Rubor: No Rubor: No Temperature: No Abnormality No Abnormality No Abnormality Tenderness on Yes Yes Yes Palpation: Wound Preparation: Ulcer Cleansing: Ulcer Cleansing: Ulcer Cleansing: Rinsed/Irrigated with Rinsed/Irrigated with Rinsed/Irrigated with Saline Saline Saline Topical Anesthetic Topical Anesthetic Topical Anesthetic Applied: Other: lidocaine  Applied: Other: lidocaine Applied: Other: lidocaine 4% 4% 4% Procedures Performed: N/A Debridement N/A Wound Number: 4 N/A N/A Photos: No Photos N/A N/A Wound Location: Right Metatarsal head fifth N/A N/A - Plantar Wounding Event: Gradually Appeared N/A N/A Primary Etiology: Arterial Insufficiency Ulcer N/A N/A Comorbid History: Hypertension, Peripheral N/A N/A Venous Disease, Gout, Osteoarthritis Date Acquired: 01/16/2017 N/A N/A Weeks of Treatment: 0 N/A N/A Wound Status: Open N/A N/A Pending Amputation on No N/A N/A Presentation: Measurements L x W x D 1x1.1x0.1 N/A N/A (cm) Area (cm) : 0.864 N/A N/A Volume (cm) : 0.086 N/A N/A % Reduction in Area: 34.90% N/A N/A % Reduction in Volume: 35.30% N/A N/A Classification: Full Thickness Without N/A N/A Exposed Support Structures Exudate Amount: Large N/A N/A Exudate Type: Purulent N/A N/A Exudate Color: yellow, brown, green N/A N/A Foul Odor After Yes N/A N/A Cleansing: Odor Anticipated Due to No N/A N/A Product Use: Wound Margin: Flat and Intact N/A N/A Granulation Amount: None Present (0%) N/A N/A Granulation Quality: N/A N/A N/A Necrotic Amount: Large (67-100%) N/A N/A Necrotic Tissue: Eschar, Adherent Slough N/A N/A Glowacki, [place] ([id number]) Exposed Structures: Fascia: No N/A N/A Fat Layer (Subcutaneous Tissue) Exposed: No Tendon: No Muscle: No Joint: No Bone: No Epithelialization: None N/A N/A Debridement: Debridement ([id number]- N/A N/A 11047) Pre-procedure  09:39 N/A N/A Verification/Time Out Taken: Pain Control: Lidocaine 4% Topical N/A N/A Solution Tissue Debrided: Fibrin/Slough, N/A N/A Subcutaneous Level: Skin/Subcutaneous N/A N/A Tissue Debridement Area (sq 1.1 N/A N/A cm): Instrument: Curette N/A N/A Bleeding: Minimum N/A N/A Hemostasis Achieved: Pressure N/A N/A Procedural Pain: 0 N/A N/A Post Procedural Pain: 0 N/A N/A Debridement Treatment Procedure was tolerated N/A N/A Response:  well Post Debridement 1x1.1x0.1 N/A N/A Measurements L x W x D (cm) Post Debridement 0.086 N/A N/A Volume: (cm) Periwound Skin Texture: Excoriation: No N/A N/A Induration: No Callus: No Crepitus: No Rash: No Scarring: No Periwound Skin Maceration: No N/A N/A Moisture: Dry/Scaly: No Periwound Skin Color: Atrophie Blanche: No N/A N/A Cyanosis: No Ecchymosis: No Erythema: No Hemosiderin Staining: No Mottled: No Pallor: No Rubor: No Temperature: No Abnormality N/A N/A Venable, [name] ([id number]) Tenderness on Yes N/A N/A Palpation: Wound Preparation: Ulcer Cleansing: N/A N/A Rinsed/Irrigated with Saline Topical Anesthetic Applied: Other: lidocaine 4% Procedures Performed: Debridement N/A N/A Treatment Notes Electronic Signature(s) Signed: 01/22/2017 10:18:20 AM By: Evlyn Kanner MD, FACS Previous Signature: 01/22/2017 9:32:53 AM Version By: Curtis Sites Entered By: Evlyn Kanner on 01/22/2017 10:18:20 Skowronek, [name] ([id number]) -------------------------------------------------------------------------------- Multi-Disciplinary Care Plan Details Patient Name: [name] Date of Service: 01/22/2017 9:15 AM Medical Record Number: [id number] Patient Account Number: [id number] Date of Birth/Sex: 05/18/1934 (83 y.o. Male) Treating RN: Curtis Sites Primary Care Shalom Ware: Darreld Mclean Other Clinician: Referring Juanpablo Ciresi: Darreld Mclean Treating Shaylynne Lunt/Extender: Rudene Re in Treatment: 3 Active Inactive ` Abuse / Safety / Falls / Self Care Management Nursing Diagnoses: Potential for falls Goals: Patient will not experience any injury related to falls Date Initiated: 12/30/2016 Target Resolution Date: 02/27/2017 Goal Status: Active Interventions: Assess fall risk on admission and as needed Notes: ` Orientation to the Wound Care Program Nursing Diagnoses: Knowledge deficit related to the wound healing center program Goals: Patient/caregiver will  verbalize understanding of the Wound Healing Center Program Date Initiated: 12/30/2016 Target Resolution Date: 02/27/2017 Goal Status: Active Interventions: Provide education on orientation to the wound center Notes: ` Wound/Skin Impairment Nursing Diagnoses: Impaired tissue integrity Albus, [name] ([id number]) Goals: Ulcer/skin breakdown will have a volume reduction of 30% by week 4 Date Initiated: 12/30/2016 Target Resolution Date: 02/27/2017 Goal Status: Active Ulcer/skin breakdown will have a volume reduction of 50% by week 8 Date Initiated: 12/30/2016 Target Resolution Date: 02/27/2017 Goal Status: Active Ulcer/skin breakdown will have a volume reduction of 80% by week 12 Date Initiated: 12/30/2016 Target Resolution Date: 02/27/2017 Goal Status: Active Ulcer/skin breakdown will heal within 14 weeks Date Initiated: 12/30/2016 Target Resolution Date: 02/27/2017 Goal Status: Active Interventions: Assess patient/caregiver ability to obtain necessary supplies Assess patient/caregiver ability to perform ulcer/skin care regimen upon admission and as needed Assess ulceration(s) every visit Notes: Electronic Signature(s) Signed: 01/22/2017 9:32:41 AM By: Curtis Sites Entered By: Curtis Sites on 01/22/2017 09:32:40 Musial, [name] ([id number]) -------------------------------------------------------------------------------- Pain Assessment Details Patient Name: [name] Date of Service: 01/22/2017 9:15 AM Medical Record Number: [id number] Patient Account Number: [id number] Date of Birth/Sex: 12-Jul-1933 (83 y.o. Male) Treating RN: Curtis Sites Primary Care Roshell Brigham: Darreld Mclean Other Clinician: Referring Denae Zulueta: Darreld Mclean Treating Artia Singley/Extender: Rudene Re in Treatment: 3 Active Problems Location of Pain Severity and Description of Pain Patient Has Paino No Site Locations Pain Management and Medication Current Pain Management: Notes Topical or  injectable lidocaine is offered to patient for acute pain when surgical debridement is performed. If needed, Patient is instructed to use over the counter pain medication for the following 24-48 hours  after debridement. Wound care MDs do not prescribed pain medications. Patient has chronic pain or uncontrolled pain. Patient has been instructed to make an appointment with their Primary Care Physician for pain management. Electronic Signature(s) Signed: 01/22/2017 5:08:01 PM By: Curtis Sites Entered By: Curtis Sites on 01/22/2017 09:19:45 Dresner, [name] ([id number]) -------------------------------------------------------------------------------- Patient/Caregiver Education Details Patient Name: [name] Date of Service: 01/22/2017 9:15 AM Medical Record Number: [id number] Patient Account Number: [id number] Date of Birth/Gender: [birth year] (83 y.o. Male) Treating RN: Curtis Sites Primary Care Physician: Darreld Mclean Other Clinician: Referring Physician: Darreld Mclean Treating Physician/Extender: Rudene Re in Treatment: 3 Education Assessment Education Provided To: Caregiver SNF nurses via written orders Education Topics Provided Wound/Skin Impairment: Handouts: Other: wound care orders Methods: Printed Electronic Signature(s) Signed: 01/22/2017 5:08:01 PM By: Curtis Sites Entered By: Curtis Sites on 01/22/2017 09:33:36 Kraker, [name] ([id number]) -------------------------------------------------------------------------------- Wound Assessment Details Patient Name: [name] Date of Service: 01/22/2017 9:15 AM Medical Record Number: [id number] Patient Account Number: [id number] Date of Birth/Sex: Apr 20, 1934 (83 y.o. Male) Treating RN: Curtis Sites Primary Care Zoiee Wimmer: Darreld Mclean Other Clinician: Referring Abdo Denault: Darreld Mclean Treating Tiffanye Hartmann/Extender: Rudene Re in Treatment: 3 Wound Status Wound Number: 1 Primary Arterial Insufficiency  Ulcer Etiology: Wound Location: Right Ankle - Lateral Wound Open Wounding Event: Gradually Appeared Status: Date Acquired: 12/04/2016 Comorbid Hypertension, Peripheral Venous Weeks Of Treatment: 3 History: Disease, Gout, Osteoarthritis Clustered Wound: No Pending Amputation On Presentation Photos Photo Uploaded By: Curtis Sites on 01/22/2017 12:54:04 Wound Measurements Length: (cm) 2 % Reduction in Width: (cm) 2.6 % Reduction in Depth: (cm) 0.1 Epithelializati Area: (cm) 4.084 Tunneling: Volume: (cm) 0.408 Undermining: Area: 28.6% Volume: 76.2% on: None No No Wound Description Full Thickness With Exposed Foul Odor After Classification: Support Structures Slough/Fibrino Wound Margin: Flat and Intact Exudate Large Amount: Exudate Type: Serous Exudate Color: amber Cleansing: No Yes Wound Bed Granulation Amount: Large (67-100%) Exposed Structure Granulation Quality: Red Fascia Exposed: No Provencal, [name] ([id number]) Necrotic Amount: Small (1-33%) Fat Layer (Subcutaneous Tissue) Exposed: Yes Necrotic Quality: Adherent Slough Tendon Exposed: No Muscle Exposed: No Joint Exposed: No Bone Exposed: No Periwound Skin Texture Texture Color No Abnormalities Noted: No No Abnormalities Noted: No Callus: No Atrophie Blanche: No Crepitus: No Cyanosis: No Excoriation: No Ecchymosis: No Induration: No Erythema: No Rash: No Hemosiderin Staining: No Scarring: No Mottled: No Pallor: No Moisture Rubor: No No Abnormalities Noted: No Dry / Scaly: No Temperature / Pain Maceration: No Temperature: No Abnormality Tenderness on Palpation: Yes Wound Preparation Ulcer Cleansing: Rinsed/Irrigated with Saline Topical Anesthetic Applied: Other: lidocaine 4%, Treatment Notes Wound #1 (Right, Lateral Ankle) 1. Cleansed with: Clean wound with Normal Saline 2. Anesthetic Topical Lidocaine 4% cream to wound bed prior to debridement 4. Dressing Applied: Aquacel  Ag 5. Secondary Dressing Applied Guaze, ABD and kerlix/Conform 7. Secured with Tape Notes netting Electronic Signature(s) Signed: 01/22/2017 9:31:24 AM By: Curtis Sites Entered By: Curtis Sites on 01/22/2017 09:31:24 Hnat, [name] ([id number]) -------------------------------------------------------------------------------- Wound Assessment Details Patient Name: [name] Date of Service: 01/22/2017 9:15 AM Medical Record Number: [id number] Patient Account Number: [id number] Date of Birth/Sex: 1933/11/13 (83 y.o. Male) Treating RN: Curtis Sites Primary Care Antaeus Karel: Darreld Mclean Other Clinician: Referring Steen Bisig: Darreld Mclean Treating Britlee Skolnik/Extender: Rudene Re in Treatment: 3 Wound Status Wound Number: 2 Primary Arterial Insufficiency Ulcer Etiology: Wound Location: Right Malleolus Wound Open Wounding Event: Gradually Appeared Status: Date Acquired: 12/04/2016 Comorbid Hypertension, Peripheral Venous Weeks Of Treatment: 3 History: Disease, Gout, Osteoarthritis Clustered Wound: No Pending Amputation On Presentation Photos Photo  Uploaded By: Curtis Sites on 01/22/2017 12:54:38 Wound Measurements Length: (cm) 1.8 % Reduction in Width: (cm) 1.6 % Reduction in Depth: (cm) 0.1 Epithelializati Area: (cm) 2.262 Tunneling: Volume: (cm) 0.226 Undermining: Area: 55.7% Volume: 77.9% on: None No No Wound Description Full Thickness With Exposed Foul Odor After Classification: Support Structures Slough/Fibrino Wound Margin: Flat and Intact Exudate Large Amount: Exudate Type: Serous Exudate Color: amber Cleansing: No Yes Wound Bed Granulation Amount: Large (67-100%) Exposed Structure Granulation Quality: Red Fascia Exposed: No Canal, [name] ([id number]) Necrotic Amount: Small (1-33%) Fat Layer (Subcutaneous Tissue) Exposed: Yes Necrotic Quality: Adherent Slough Tendon Exposed: No Muscle Exposed: No Joint Exposed: No Bone Exposed:  No Periwound Skin Texture Texture Color No Abnormalities Noted: No No Abnormalities Noted: No Callus: No Atrophie Blanche: No Crepitus: No Cyanosis: No Excoriation: No Ecchymosis: No Induration: No Erythema: No Rash: No Hemosiderin Staining: No Scarring: No Mottled: No Pallor: No Moisture Rubor: No No Abnormalities Noted: No Dry / Scaly: No Temperature / Pain Maceration: No Temperature: No Abnormality Tenderness on Palpation: Yes Wound Preparation Ulcer Cleansing: Rinsed/Irrigated with Saline Topical Anesthetic Applied: Other: lidocaine 4%, Treatment Notes Wound #2 (Right Malleolus) 1. Cleansed with: Clean wound with Normal Saline 2. Anesthetic Topical Lidocaine 4% cream to wound bed prior to debridement 4. Dressing Applied: Aquacel Ag 5. Secondary Dressing Applied Guaze, ABD and kerlix/Conform 7. Secured with Tape Notes netting Electronic Signature(s) Signed: 01/22/2017 9:31:41 AM By: Curtis Sites Entered By: Curtis Sites on 01/22/2017 09:31:40 Prue, [name] ([id number]) -------------------------------------------------------------------------------- Wound Assessment Details Patient Name: [name], [name] Date of Service: 01/22/2017 9:15 AM Medical Record Number: [id number] Patient Account Number: [id number] Date of Birth/Sex: January 10, 1934 (83 y.o. Male) Treating RN: Curtis Sites Primary Care Sanika Brosious: Darreld Mclean Other Clinician: Referring Mehmet Scally: Darreld Mclean Treating Myliah Medel/Extender: Rudene Re in Treatment: 3 Wound Status Wound Number: 3 Primary Arterial Insufficiency Ulcer Etiology: Wound Location: Right Toe Third Wound Open Wounding Event: Gradually Appeared Status: Date Acquired: 12/04/2016 Comorbid Hypertension, Peripheral Venous Weeks Of Treatment: 3 History: Disease, Gout, Osteoarthritis Clustered Wound: No Pending Amputation On Presentation Photos Photo Uploaded By: Curtis Sites on 01/22/2017 12:54:39 Wound  Measurements Length: (cm) 1 % Reduction in Width: (cm) 1.5 % Reduction in Depth: (cm) 0.1 Epithelializati Area: (cm) 1.178 Tunneling: Volume: (cm) 0.118 Undermining: Area: 67.4% Volume: 83.7% on: None No No Wound Description Full Thickness Without Exposed Foul Odor After Classification: Support Structures Due to Product Wound Margin: Flat and Intact Slough/Fibrino Exudate Large Amount: Exudate Type: Purulent Exudate Color: yellow, brown, green Cleansing: Yes Use: No No Wound Bed Granulation Amount: None Present (0%) Exposed Structure Necrotic Amount: Large (67-100%) Fascia Exposed: No Muzzy, [name] ([id number]) Necrotic Quality: Eschar Fat Layer (Subcutaneous Tissue) Exposed: No Tendon Exposed: No Muscle Exposed: No Joint Exposed: No Bone Exposed: No Periwound Skin Texture Texture Color No Abnormalities Noted: No No Abnormalities Noted: No Callus: No Atrophie Blanche: No Crepitus: No Cyanosis: No Excoriation: No Ecchymosis: No Induration: No Erythema: No Rash: No Hemosiderin Staining: No Scarring: No Mottled: No Pallor: No Moisture Rubor: No No Abnormalities Noted: No Dry / Scaly: No Temperature / Pain Maceration: No Temperature: No Abnormality Tenderness on Palpation: Yes Wound Preparation Ulcer Cleansing: Rinsed/Irrigated with Saline Topical Anesthetic Applied: Other: lidocaine 4%, Treatment Notes Wound #3 (Right Toe Third) 1. Cleansed with: Clean wound with Normal Saline 2. Anesthetic Topical Lidocaine 4% cream to wound bed prior to debridement 4. Dressing Applied: Aquacel Ag 5. Secondary Dressing Applied Guaze, ABD and kerlix/Conform 7. Secured with Tape Notes netting Electronic Signature(s) Signed: 01/22/2017  9:31:56 AM By: Curtis Sitesorthy, Joanna Entered By: Curtis Sitesorthy, Joanna on 01/22/2017 09:31:56 Osbourn, [name] ([id number]) -------------------------------------------------------------------------------- Wound Assessment  Details Patient Name: [name], [name] Date of Service: 01/22/2017 9:15 AM Medical Record Number: [id number] Patient Account Number: [id number] Date of Birth/Sex: May 04, 1934 [age] y.o. Male) Treating RN: Curtis Sitesorthy, Joanna Primary Care Muzamil Harker: Darreld McleanMILES, LINDA Other Clinician: Referring Stacy Deshler: Darreld McleanMILES, LINDA Treating Avyn Coate/Extender: Rudene ReBritto, Errol Weeks in Treatment: 3 Wound Status Wound Number: 4 Primary Arterial Insufficiency Ulcer Etiology: Wound Location: Right Metatarsal head fifth - Plantar Wound Open Status: Wounding Event: Gradually Appeared Comorbid Hypertension, Peripheral Venous Date Acquired: 01/16/2017 History: Disease, Gout, Osteoarthritis Weeks Of Treatment: 0 Clustered Wound: No Photos Photo Uploaded By: Curtis Sitesorthy, Joanna on 01/22/2017 12:55:15 Wound Measurements Length: (cm) 1 % Reduction i Width: (cm) 1.1 % Reduction i Depth: (cm) 0.1 Epithelializa Area: (cm) 0.864 Tunneling: Volume: (cm) 0.086 Undermining: n Area: 34.9% n Volume: 35.3% tion: None No No Wound Description Full Thickness Without Exposed Foul Odor Aft Classification: Support Structures Due to Produc Wound Margin: Flat and Intact Slough/Fibrin Exudate Large Amount: Exudate Type: Purulent Exudate Color: yellow, brown, green er Cleansing: Yes t Use: No o Yes Wound Bed Granulation Amount: None Present (0%) Exposed Structure Necrotic Amount: Large (67-100%) Fascia Exposed: No Cominsky, [name] ([id number]) Necrotic Quality: Eschar, Adherent Slough Fat Layer (Subcutaneous Tissue) Exposed: No Tendon Exposed: No Muscle Exposed: No Joint Exposed: No Bone Exposed: No Periwound Skin Texture Texture Color No Abnormalities Noted: No No Abnormalities Noted: No Callus: No Atrophie Blanche: No Crepitus: No Cyanosis: No Excoriation: No Ecchymosis: No Induration: No Erythema: No Rash: No Hemosiderin Staining: No Scarring: No Mottled: No Pallor: No Moisture Rubor: No No Abnormalities  Noted: No Dry / Scaly: No Temperature / Pain Maceration: No Temperature: No Abnormality Tenderness on Palpation: Yes Wound Preparation Ulcer Cleansing: Rinsed/Irrigated with Saline Topical Anesthetic Applied: Other: lidocaine 4%, Treatment Notes Wound #4 (Right, Plantar Metatarsal head fifth) 1. Cleansed with: Clean wound with Normal Saline 2. Anesthetic Topical Lidocaine 4% cream to wound bed prior to debridement 4. Dressing Applied: Aquacel Ag 5. Secondary Dressing Applied Guaze, ABD and kerlix/Conform 7. Secured with Tape Notes netting Electronic Signature(s) Signed: 01/22/2017 9:32:11 AM By: Curtis Sitesorthy, Joanna Entered By: Curtis Sitesorthy, Joanna on 01/22/2017 09:32:10 Banner, [name] ([id number]) -------------------------------------------------------------------------------- Vitals Details Patient Name: [name], [name] Date of Service: 01/22/2017 9:15 AM Medical Record Number: [id number] Patient Account Number: [id number] Date of Birth/Sex: May 04, 1934 [age] y.o. Male) Treating RN: Curtis Sitesorthy, Joanna Primary Care Prestyn Stanco: Darreld McleanMILES, LINDA Other Clinician: Referring Audry Pecina: Darreld McleanMILES, LINDA Treating Waynette Towers/Extender: Rudene ReBritto, Errol Weeks in Treatment: 3 Vital Signs Time Taken: 09:19 Pulse (bpm): 67 Respiratory Rate (breaths/min): 16 Blood Pressure (mmHg): 110/54 Reference Range: 80 - 120 mg / dl Electronic Signature(s) Signed: 01/22/2017 5:08:01 PM By: Curtis Sitesorthy, Joanna Entered By: Curtis Sitesorthy, Joanna on 01/22/2017 09:20:02

## 2017-01-26 NOTE — Progress Notes (Addendum)
Simmering, [place] ([id number]) Visit Report for 01/22/2017 Chief Complaint Document Details Patient Name: [name], [name] Date of Service: 01/22/2017 9:15 AM Medical Record Number: [id number] Patient Account Number: [id number] Date of Birth/Sex: 1933/08/17 (83 y.o. Male) Treating RN: Montey Hora Primary Care Provider: Delight Stare Other Clinician: Referring Provider: Delight Stare Treating Provider/Extender: Frann Rider in Treatment: 3 Information Obtained from: Patient Chief Complaint 12/30/16; patient arrives from [place] unaccompanied. He has 3 open wounds on the right foot and 2 threatened areas Electronic Signature(s) Signed: 01/22/2017 10:18:45 AM By: Christin Fudge MD, FACS Entered By: Christin Fudge on 01/22/2017 10:18:44 Headings, [name] ([id number]) -------------------------------------------------------------------------------- Debridement Details Patient Name: [name] Date of Service: 01/22/2017 9:15 AM Medical Record Number: [id number] Patient Account Number: [id number] Date of Birth/Sex: 1933/08/13 (83 y.o. Male) Treating RN: Montey Hora Primary Care Provider: Delight Stare Other Clinician: Referring Provider: Delight Stare Treating Provider/Extender: Frann Rider in Treatment: 3 Debridement Performed for Wound #2 Right Malleolus Assessment: Performed By: Physician Christin Fudge, MD Debridement: Debridement Severity of Tissue Pre Fat layer exposed Debridement: Pre-procedure Verification/Time Out Yes - 09:37 Taken: Start Time: 09:37 Pain Control: Lidocaine 4% Topical Solution Level: Skin/Subcutaneous Tissue Total Area Debrided (L x 1.8 (cm) x 1.6 (cm) = 2.88 (cm) W): Tissue and other Viable, Non-Viable, Fibrin/Slough, Subcutaneous material debrided: Instrument: Curette Bleeding: Minimum Hemostasis Achieved: Pressure End Time: 09:39 Procedural Pain: 0 Post Procedural Pain: 0 Response to Treatment: Procedure was tolerated  well Post Debridement Measurements of Total Wound Length: (cm) 1.8 Width: (cm) 1.6 Depth: (cm) 0.1 Volume: (cm) 0.226 Character of Wound/Ulcer Post Improved Debridement: Severity of Tissue Post Debridement: Fat layer exposed Post Procedure Diagnosis Same as Pre-procedure Electronic Signature(s) Signed: 01/22/2017 10:18:28 AM By: Christin Fudge MD, FACS Signed: 01/22/2017 5:08:01 PM By: Montey Hora [name], [name] ([id number]) Entered By: Christin Fudge on 01/22/2017 10:18:28 [place], [name] ([id number]) -------------------------------------------------------------------------------- Debridement Details Patient Name: [name] Date of Service: 01/22/2017 9:15 AM Medical Record Number: [id number] Patient Account Number: [id number] Date of Birth/Sex: [birth year] (83 y.o. Male) Treating RN: Montey Hora Primary Care Provider: Delight Stare Other Clinician: Referring Provider: Delight Stare Treating Provider/Extender: Frann Rider in Treatment: 3 Debridement Performed for Wound #4 Right,Plantar Metatarsal head fifth Assessment: Performed By: Physician Christin Fudge, MD Debridement: Debridement Severity of Tissue Pre Fat layer exposed Debridement: Pre-procedure Verification/Time Out Yes - 09:39 Taken: Start Time: 09:39 Pain Control: Lidocaine 4% Topical Solution Level: Skin/Subcutaneous Tissue Total Area Debrided (L x 1 (cm) x 1.1 (cm) = 1.1 (cm) W): Tissue and other Viable, Non-Viable, Fibrin/Slough, Subcutaneous material debrided: Instrument: Curette Bleeding: Minimum Hemostasis Achieved: Pressure End Time: 09:41 Procedural Pain: 0 Post Procedural Pain: 0 Response to Treatment: Procedure was tolerated well Post Debridement Measurements of Total Wound Length: (cm) 1 Width: (cm) 1.1 Depth: (cm) 0.1 Volume: (cm) 0.086 Character of Wound/Ulcer Post Improved Debridement: Severity of Tissue Post Debridement: Fat layer exposed Post Procedure  Diagnosis Same as Pre-procedure Electronic Signature(s) Signed: 01/22/2017 10:18:36 AM By: Christin Fudge MD, FACS Signed: 01/22/2017 5:08:01 PM By: Montey Hora [name], [name] ([id number]) Entered By: Christin Fudge on 01/22/2017 10:18:36 [name], [name] ([id number]) -------------------------------------------------------------------------------- HPI Details Patient Name: [name] Date of Service: 01/22/2017 9:15 AM Medical Record Number: [id number] Patient Account Number: [id number] Date of Birth/Sex: 1933/10/13 (83 y.o. Male) Treating RN: Montey Hora Primary Care Provider: Delight Stare Other Clinician: Referring Provider: Delight Stare Treating Provider/Extender: Frann Rider in Treatment: 3 History of Present Illness HPI Description: 12/30/16; this is an 83 year old man who comes from [place] skilled facility.  He comes today unaccompanied and without any history. Looking through [facility] link I am able to see that he was admitted to hospital from 5/25 through 12/04/16 at that point with dehydration, severe hyponatremia acute kidney injury. He was treated with IV fluid replacement including 3% normal saline. Nowhere in his discharge summary did I see any reference to wounds on his feet and is mentioned he does not come in with any history. He does come in with a pack of cigarettes therefore I'm assuming he is a smoker. He is not a listed diabetic. There is some reference in his notes that he has a DSS Education officer, [employer] is his guardian I'm not completely sure if that is true in any case the patient has some degree of dementia although he is able to tell me that he is 83 years old came from [place]. He is not able to give a history of these wounds. Our intake nurse notes maggots in the right third toe. Beyond this he has an area over the right lateral malleolus on the right lateral foot. He also has to threatened areas on the base of the right fifth metatarsal  and on the lateral aspect of the right fifth metatarsal head. We are completely unable to do ABIs in the clinic largely because when we inflated the cuff he seemed to withdraw the foot. 01/05/17; the patient was seen urgently by Dr. Delana Meyer of vein and vascular. He agreed with our assessment of critical limb ischemia and set him up for an angiogram. Unfortunately the patient was not transported for preoperative evaluation therefore the angiogram has been canceled. I am not exactly sure if and when this is going to be rescheduled. The patient's wounds look about the same, right lateral ankle right lateral foot third toe. There is also concerning areas over the fifth met head and base of the fifth metatarsal which are not open but are covered by a dark eschar logorrhea and painful. We have been using silver alginate and x-ray of the right foot done at the facility showed no definite radiographic evidence of acute fracture or dislocation there was an old avulsion fracture of the proximal phalanx fifth digit as well as old fractures of the base of the proximal phalanx of the fifth digit. There is no comment about osteomyelitis 01/16/17 I have patient's record from his operative report from [facility] with Dr. Delana Meyer were fortunately patient underwent a successful angioplasty in regard to his right lower extremity. The anterior tibial in-line flow was improved with less than 5% residual stenosis. Angioplasty of the SFA and popliteal arteries also yielded excellent results in post procedure testing with less than 10% residual stenosis. Overall patient appears to have done very well with this procedure. Hopefully this will translate into better wound healing in regard to his right lower extremity wounds. 01/22/2017 -- the patient comes with no dressings appropriate on his right lower extremity and the patient is rather non-verbal and coherent. He has no nursing or caregiver help. Osika,  Robertson ([id number]) Electronic Signature(s) Signed: 01/22/2017 10:19:36 AM By: Christin Fudge MD, FACS Entered By: Christin Fudge on 01/22/2017 10:19:36 [name], [name] ([id number]) -------------------------------------------------------------------------------- Physical Exam Details Patient Name: [name] Date of Service: 01/22/2017 9:15 AM Medical Record Number: [id number] Patient Account Number: [id number] Date of Birth/Sex: 1933/08/20 (83 y.o. Male) Treating RN: Montey Hora Primary Care Provider: Delight Stare Other Clinician: Referring Provider: Delight Stare Treating Provider/Extender: Frann Rider in Treatment: 3 Constitutional . Pulse regular. Respirations  normal and unlabored. Afebrile. . Eyes Nonicteric. Reactive to light. Ears, Nose, Mouth, and Throat Lips, teeth, and gums WNL.Marland Kitchen Moist mucosa without lesions. Neck supple and nontender. No palpable supraclavicular or cervical adenopathy. Normal sized without goiter. Respiratory WNL. No retractions.. Breath sounds WNL, No rubs, rales, rhonchi, or wheeze.. Cardiovascular Heart rhythm and rate regular, no murmur or gallop.. Pedal Pulses WNL. No clubbing, cyanosis or edema. Chest Breasts symmetical and no nipple discharge.. Breast tissue WNL, no masses, lumps, or tenderness.. Lymphatic No adneopathy. No adenopathy. No adenopathy. Musculoskeletal Adexa without tenderness or enlargement.. Digits and nails w/o clubbing, cyanosis, infection, petechiae, ischemia, or inflammatory conditions.. Integumentary (Hair, Skin) No suspicious lesions. No crepitus or fluctuance. No peri-wound warmth or erythema. No masses.Marland Kitchen Psychiatric Judgement and insight Intact.. No evidence of depression, anxiety, or agitation.. Notes the patient's wounds on the right lower lateral ankle and the right plantar aspect on the fifth metatarsal head were sharply debrided. The right lateral second toe wound also had some eschar and this was  sharply debrided. Under this there is some healthy granulation tissue. Electronic Signature(s) Signed: 01/22/2017 10:20:25 AM By: Christin Fudge MD, FACS Entered By: Christin Fudge on 01/22/2017 10:20:24 [name], [name] ([id number]) -------------------------------------------------------------------------------- Physician Orders Details Patient Name: [name] Date of Service: 01/22/2017 9:15 AM Medical Record Number: [id number] Patient Account Number: [id number] Date of Birth/Sex: 05-Jan-1934 (83 y.o. Male) Treating RN: Montey Hora Primary Care Provider: Delight Stare Other Clinician: Referring Provider: Delight Stare Treating Provider/Extender: Frann Rider in Treatment: 3 Verbal / Phone Orders: No Diagnosis Coding Wound Cleansing Wound #1 Right,Lateral Ankle o Clean wound with Normal Saline. o May Shower, gently pat wound dry prior to applying new dressing. Wound #2 Right Malleolus o Clean wound with Normal Saline. o May Shower, gently pat wound dry prior to applying new dressing. Wound #3 Right Toe Third o Clean wound with Normal Saline. o May Shower, gently pat wound dry prior to applying new dressing. Wound #4 Right,Plantar Metatarsal head fifth o Clean wound with Normal Saline. o May Shower, gently pat wound dry prior to applying new dressing. Anesthetic Wound #1 Right,Lateral Ankle o Topical Lidocaine 4% cream applied to wound bed prior to debridement Wound #2 Right Malleolus o Topical Lidocaine 4% cream applied to wound bed prior to debridement Wound #3 Right Toe Third o Topical Lidocaine 4% cream applied to wound bed prior to debridement Wound #4 Right,Plantar Metatarsal head fifth o Topical Lidocaine 4% cream applied to wound bed prior to debridement Primary Wound Dressing Wound #1 Right,Lateral Ankle o Aquacel Ag Wound #2 Right Malleolus o Aquacel Ag Cozad, [name] ([id number]) Wound #3 Right Toe Third o Aquacel Ag Wound  #4 Right,Plantar Metatarsal head fifth o Aquacel Ag Secondary Dressing Wound #1 Right,Lateral Ankle o Gauze and Kerlix/Conform Wound #2 Right Malleolus o Gauze and Kerlix/Conform Wound #3 Right Toe Third o Gauze and Kerlix/Conform Wound #4 Right,Plantar Metatarsal head fifth o Gauze and Kerlix/Conform Dressing Change Frequency Wound #1 Right,Lateral Ankle o Change dressing every day. Wound #2 Right Malleolus o Change dressing every day. Wound #3 Right Toe Third o Change dressing every day. Wound #4 Right,Plantar Metatarsal head fifth o Change dressing every day. Follow-up Appointments Wound #1 Right,Lateral Ankle o Return Appointment in 1 week. Wound #2 Right Malleolus o Return Appointment in 1 week. Wound #3 Right Toe Third o Return Appointment in 1 week. Wound #4 Right,Plantar Metatarsal head fifth o Return Appointment in 1 week. Additional Orders / Instructions Wound #1 Right,Lateral Ankle [name], [name] ([id number]) o  Stop Smoking o Increase protein intake. Wound #2 Right Malleolus o Stop Smoking o Increase protein intake. Wound #3 Right Toe Third o Stop Smoking o Increase protein intake. Wound #4 Right,Plantar Metatarsal head fifth o Stop Smoking o Increase protein intake. Electronic Signature(s) Signed: 01/22/2017 4:39:35 PM By: Christin Fudge MD, FACS Signed: 01/22/2017 5:08:01 PM By: Montey Hora Entered By: Montey Hora on 01/22/2017 09:41:26 [name], [name] ([id number]) -------------------------------------------------------------------------------- Problem List Details Patient Name: [name], [name] Date of Service: 01/22/2017 9:15 AM Medical Record Number: [id number] Patient Account Number: [id number] Date of Birth/Sex: 1934-03-03 (83 y.o. 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.513 Non-pressure chronic ulcer of other part of right foot with 12/30/2016 Yes necrosis of muscle I70.235 Atherosclerosis of native arteries of right leg with 12/30/2016 Yes ulceration of other part of foot I70.245 Atherosclerosis of native arteries of left leg with ulceration 12/30/2016 Yes of other part of foot F17.218 Nicotine dependence, cigarettes, with other nicotine- 01/22/2017 Yes induced disorders Inactive Problems Resolved Problems Electronic Signature(s) Signed: 01/22/2017 10:22:11 AM By: Christin Fudge MD, FACS Previous Signature: 01/22/2017 10:18:13 AM Version By: Christin Fudge MD, FACS Entered By: Christin Fudge on 01/22/2017 10:22:10 Wilczak, [name] ([id number]) [name], [name] ([id number]) -------------------------------------------------------------------------------- Progress Note Details Patient Name: [name] Date of Service: 01/22/2017 9:15 AM Medical Record Number: [id number] Patient Account Number: [id number] Date of Birth/Sex: 1934-05-08 (83 y.o. Male) Treating RN: Montey Hora Primary Care Provider: Delight Stare Other Clinician: Referring Provider: Delight Stare Treating Provider/Extender: Frann Rider in Treatment: 3 Subjective Chief Complaint Information obtained from Patient 12/30/16; patient arrives from [place] unaccompanied. He has 3 open wounds on the right foot and 2 threatened areas History of Present Illness (HPI) 12/30/16; this is an 83 year old man who comes from [place] skilled facility. He comes today unaccompanied and without any history. Looking through [facility] link I am able to see that he was admitted to hospital from 5/25  through 12/04/16 at that point with dehydration, severe hyponatremia acute kidney injury. He was treated with IV fluid replacement including 3% normal saline. Nowhere in his discharge summary did I see any reference to wounds on his feet and is mentioned he does not come in with any history. He does come in with a pack of cigarettes therefore I'm assuming he is a smoker. He is not a listed diabetic. There is some reference in his notes that he has a DSS Education officer, [employer] is his guardian I'm not completely sure if that is true in any case the patient has some degree of dementia although he is able to tell me that he is 83 years old came from [place]. He is not able to give a history of these wounds. Our intake nurse notes maggots in the right third toe. Beyond this he has an area over the right lateral malleolus on the right lateral foot. He also has to threatened areas on the base of the right fifth metatarsal and on the lateral aspect of the right fifth metatarsal head. We are completely unable to do ABIs in the clinic largely because when we inflated the cuff he seemed to withdraw the foot. 01/05/17; the patient was seen urgently  by Dr. Delana Meyer of vein and vascular. He agreed with our assessment of critical limb ischemia and set him up for an angiogram. Unfortunately the patient was not transported for preoperative evaluation therefore the angiogram has been canceled. I am not exactly sure if and when this is going to be rescheduled. The patient's wounds look about the same, right lateral ankle right lateral foot third toe. There is also concerning areas over the fifth met head and base of the fifth metatarsal which are not open but are covered by a dark eschar logorrhea and painful. We have been using silver alginate and x-ray of the right foot done at the facility showed no definite radiographic evidence of acute fracture or dislocation there was an old avulsion fracture of the proximal  phalanx fifth digit as well as old fractures of the base of the proximal phalanx of the fifth digit. There is no comment about osteomyelitis 01/16/17 I have patient's record from his operative report from [facility] with Dr. Delana Meyer were fortunately patient underwent a successful angioplasty in regard to his right lower extremity. The anterior tibial in-line flow was improved with less than 5% residual stenosis. Angioplasty of the SFA and popliteal arteries also yielded excellent results in post procedure testing with less than 10% residual Savo, [name] ([id number]) stenosis. Overall patient appears to have done very well with this procedure. Hopefully this will translate into better wound healing in regard to his right lower extremity wounds. 01/22/2017 -- the patient comes with no dressings appropriate on his right lower extremity and the patient is rather non-verbal and coherent. He has no nursing or caregiver help. Objective Constitutional Pulse regular. Respirations normal and unlabored. Afebrile. Vitals Time Taken: 9:19 AM, Pulse: 67 bpm, Respiratory Rate: 16 breaths/min, Blood Pressure: 110/54 mmHg. Eyes Nonicteric. Reactive to light. Ears, Nose, Mouth, and Throat Lips, teeth, and gums WNL.Marland Kitchen Moist mucosa without lesions. Neck supple and nontender. No palpable supraclavicular or cervical adenopathy. Normal sized without goiter. Respiratory WNL. No retractions.. Breath sounds WNL, No rubs, rales, rhonchi, or wheeze.. Cardiovascular Heart rhythm and rate regular, no murmur or gallop.. Pedal Pulses WNL. No clubbing, cyanosis or edema. Chest Breasts symmetical and no nipple discharge.. Breast tissue WNL, no masses, lumps, or tenderness.. Lymphatic No adneopathy. No adenopathy. No adenopathy. Musculoskeletal Adexa without tenderness or enlargement.. Digits and nails w/o clubbing, cyanosis, infection, petechiae, ischemia, or inflammatory  conditions.Marland Kitchen Psychiatric Judgement and insight Intact.. No evidence of depression, anxiety, or agitation.Marland Kitchen Antonacci, [name] ([id number]) General Notes: the patient's wounds on the right lower lateral ankle and the right plantar aspect on the fifth metatarsal head were sharply debrided. The right lateral second toe wound also had some eschar and this was sharply debrided. Under this there is some healthy granulation tissue. Integumentary (Hair, Skin) No suspicious lesions. No crepitus or fluctuance. No peri-wound warmth or erythema. No masses.. Wound #1 status is Open. Original cause of wound was Gradually Appeared. The wound is located on the Right,Lateral Ankle. The wound measures 2cm length x 2.6cm width x 0.1cm depth; 4.084cm^2 area and 0.408cm^3 volume. There is Fat Layer (Subcutaneous Tissue) Exposed exposed. There is no tunneling or undermining noted. There is a large amount of serous drainage noted. The wound margin is flat and intact. There is large (67-100%) red granulation within the wound bed. There is a small (1-33%) amount of necrotic tissue within the wound bed including Adherent Slough. The periwound skin appearance did not exhibit: Callus, Crepitus, Excoriation, Induration, Rash, Scarring, Dry/Scaly, Maceration, Atrophie Blanche,  Cyanosis, Ecchymosis, Hemosiderin Staining, Mottled, Pallor, Rubor, Erythema. Periwound temperature was noted as No Abnormality. The periwound has tenderness on palpation. Wound #2 status is Open. Original cause of wound was Gradually Appeared. The wound is located on the Right Malleolus. The wound measures 1.8cm length x 1.6cm width x 0.1cm depth; 2.262cm^2 area and 0.226cm^3 volume. There is Fat Layer (Subcutaneous Tissue) Exposed exposed. There is no tunneling or undermining noted. There is a large amount of serous drainage noted. The wound margin is flat and intact. There is large (67-100%) red granulation within the wound bed. There is a small (1-33%)  amount of necrotic tissue within the wound bed including Adherent Slough. The periwound skin appearance did not exhibit: Callus, Crepitus, Excoriation, Induration, Rash, Scarring, Dry/Scaly, Maceration, Atrophie Blanche, Cyanosis, Ecchymosis, Hemosiderin Staining, Mottled, Pallor, Rubor, Erythema. Periwound temperature was noted as No Abnormality. The periwound has tenderness on palpation. Wound #3 status is Open. Original cause of wound was Gradually Appeared. The wound is located on the Right Toe Third. The wound measures 1cm length x 1.5cm width x 0.1cm depth; 1.178cm^2 area and 0.118cm^3 volume. There is no tunneling or undermining noted. There is a large amount of purulent drainage noted. The wound margin is flat and intact. There is no granulation within the wound bed. There is a large (67-100%) amount of necrotic tissue within the wound bed including Eschar. The periwound skin appearance did not exhibit: Callus, Crepitus, Excoriation, Induration, Rash, Scarring, Dry/Scaly, Maceration, Atrophie Blanche, Cyanosis, Ecchymosis, Hemosiderin Staining, Mottled, Pallor, Rubor, Erythema. Periwound temperature was noted as No Abnormality. The periwound has tenderness on palpation. Wound #4 status is Open. Original cause of wound was Gradually Appeared. The wound is located on the Right,Plantar Metatarsal head fifth. The wound measures 1cm length x 1.1cm width x 0.1cm depth; 0.864cm^2 area and 0.086cm^3 volume. There is no tunneling or undermining noted. There is a large amount of purulent drainage noted. The wound margin is flat and intact. There is no granulation within the wound bed. There is a large (67-100%) amount of necrotic tissue within the wound bed including Eschar and Adherent Slough. The periwound skin appearance did not exhibit: Callus, Crepitus, Excoriation, Induration, Rash, Scarring, Dry/Scaly, Maceration, Atrophie Blanche, Cyanosis, Ecchymosis, Hemosiderin Staining, Mottled, Pallor,  Rubor, Erythema. Periwound temperature was noted as No Abnormality. The periwound has tenderness on palpation. 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 There was a Skin/Subcutaneous Tissue Debridement ([id number]) debridement with total area of 2.88 sq cm performed by Christin Fudge, MD. with the following instrument(s): Curette to remove Viable and Non-Viable tissue/material including Fibrin/Slough and Subcutaneous after achieving pain control using Lidocaine 4% Topical Solution. A time out was conducted at 09:37, prior to the start of the procedure. A Minimum amount of bleeding was controlled with Pressure. The procedure was tolerated well with a pain level of 0 throughout and a pain  level of 0 following the procedure. Post Debridement Measurements: 1.8cm length x 1.6cm width x 0.1cm depth; 0.226cm^3 volume. Character of Wound/Ulcer Post Debridement is improved. Severity of Tissue Post Debridement is: Fat layer exposed. 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 There  was a Skin/Subcutaneous Tissue Debridement ([id number]) debridement with total area of 1.1 sq cm performed by Evlyn Kanner, MD. with the following instrument(s): Curette to remove Viable and Non-Viable tissue/material including Fibrin/Slough and Subcutaneous after achieving pain control using Lidocaine 4% Topical Solution. A time out was conducted at 09:39, prior to the start of the procedure. A Minimum amount of bleeding was controlled with Pressure. The procedure was tolerated well with a pain level of 0 throughout and a pain level of 0 following the procedure. Post Debridement Measurements: 1cm length x 1.1cm width x 0.1cm depth; 0.086cm^3 volume. Character of Wound/Ulcer Post Debridement is improved. Severity of Tissue Post Debridement is: Fat layer exposed. Post procedure Diagnosis Wound #4: Same as Pre-Procedure Micallef, [name] ([id number]) Plan Wound Cleansing: Wound #1 Right,Lateral Ankle: Clean wound with Normal Saline. May Shower, gently pat wound dry prior to applying new dressing. Wound #2 Right Malleolus: Clean wound with Normal Saline. May Shower, gently pat wound dry prior to applying new dressing. Wound #3 Right Toe Third: Clean wound with Normal Saline. May Shower, gently pat wound dry prior to applying new dressing. Wound #4 Right,Plantar Metatarsal head fifth: Clean wound with Normal Saline. May Shower, gently pat wound dry prior to applying new dressing. Anesthetic: Wound #1 Right,Lateral Ankle: Topical Lidocaine 4% cream applied to wound bed prior to debridement Wound #2 Right Malleolus: Topical Lidocaine 4% cream applied to wound bed prior to debridement Wound #3 Right Toe Third: Topical Lidocaine 4% cream applied to wound bed prior to debridement Wound #4 Right,Plantar Metatarsal head fifth: Topical Lidocaine 4% cream applied to wound bed prior to debridement Primary Wound Dressing: Wound #1 Right,Lateral Ankle: Aquacel Ag Wound #2 Right  Malleolus: Aquacel Ag Wound #3 Right Toe Third: Aquacel Ag Wound #4 Right,Plantar Metatarsal head fifth: Aquacel Ag Secondary Dressing: Wound #1 Right,Lateral Ankle: Gauze and Kerlix/Conform Wound #2 Right Malleolus: Gauze and Kerlix/Conform Wound #3 Right Toe Third: Gauze and Kerlix/Conform Wound #4 Right,Plantar Metatarsal head fifth: Gauze and Kerlix/Conform Dressing Change Frequency: Wound #1 Right,Lateral Ankle: Change dressing every day. Wound #2 Right Malleolus: Change dressing every day. Alonzo, [name] ([id number]) Wound #3 Right Toe Third: Change dressing every day. Wound #4 Right,Plantar Metatarsal head fifth: Change dressing every day. Follow-up Appointments: Wound #1 Right,Lateral Ankle: Return Appointment in 1 week. Wound #2 Right Malleolus: Return Appointment in 1 week. Wound #3 Right Toe Third: Return Appointment in 1 week. Wound #4 Right,Plantar Metatarsal head fifth: Return Appointment in 1 week. Additional Orders / Instructions: Wound #1 Right,Lateral Ankle: Stop Smoking Increase protein intake. Wound #2 Right Malleolus: Stop Smoking Increase protein intake. Wound #3 Right Toe Third: Stop Smoking Increase protein intake. Wound #4 Right,Plantar Metatarsal head fifth: Stop Smoking Increase protein intake. After review of the patient's wounds today and having been sharp debridement have recommended we use silver alginate overall these areas and offload them appropriately. The patient also continues to smoke and I have spent 3 minutes discussing with him the need to completely give up smoking. Risks, benefits and alternatives and all the possible ways of doing this have been  discussed with him in detail. He will get his PCP to prescribe an appropriate method of nicotine substitution and follow up in the long term We will also communicate with the nursing staff at the facility to make sure they do appropriate dressing changes. Electronic  Signature(s) Signed: 03/23/2017 1:02:52 PM By: Christin Fudge MD, FACS Previous Signature: 01/22/2017 10:22:50 AM Version By: Christin Fudge MD, FACS Previous Signature: 01/22/2017 10:21:45 AM Version By: Christin Fudge MD, FACS Vinal, Casselberry ([id number]) Entered By: Christin Fudge on 03/23/2017 13:01:33 Tippen, [name] ([id number]) -------------------------------------------------------------------------------- SuperBill Details Patient Name: Litzenberger, [name] Date of Service: 01/22/2017 Medical Record Number: [id number] Patient Account Number: [id number] Date of Birth/Sex: 26-Oct-1933 (83 y.o. 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 of other part of right foot with necrosis of muscle 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 F17.218 Nicotine dependence, cigarettes, with other nicotine-induced disorders Facility Procedures CPT4: Description Modifier Quantity Code [id number] 11042 - DEB SUBQ TISSUE 20 SQ CM/< 1 ICD-10 Description Diagnosis L97.311 Non-pressure chronic ulcer of right ankle limited to breakdown of skin L97.513 Non-pressure chronic ulcer of other part of right  foot with necrosis of muscle 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 CPT4: 27670110 [id number]-SMOKING CESSATION 3-10MINS 1 ICD-10 Description Diagnosis L97.311  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

## 2017-01-29 ENCOUNTER — Encounter: Payer: Medicare HMO | Admitting: Surgery

## 2017-01-29 DIAGNOSIS — I70235 Atherosclerosis of native arteries of right leg with ulceration of other part of foot: Secondary | ICD-10-CM | POA: Diagnosis not present

## 2017-01-31 NOTE — Progress Notes (Signed)
[name], [name] ([id number]) Visit Report for 01/29/2017 Chief Complaint Document Details Patient Name: [name], [name] Date of Service: 01/29/2017 9:15 AM Medical Record Number: [id number] Patient Account Number: [id number] Date of Birth/Sex: September 27, 1933 (83 y.o. Male) Treating RN: Montey Hora Primary Care Provider: Delight Stare Other Clinician: Referring Provider: Delight Stare Treating Provider/Extender: Frann Rider in Treatment: 4 Information Obtained from: Patient Chief Complaint 12/30/16; patient arrives from [place] unaccompanied. He has 3 open wounds on the right foot and 2 threatened areas Electronic Signature(s) Signed: 01/29/2017 9:44:24 AM By: Christin Fudge MD, FACS Entered By: Christin Fudge on 01/29/2017 09:44:23 [name], [name] ([id number]) -------------------------------------------------------------------------------- Debridement Details Patient Name: [name] Date of Service: 01/29/2017 9:15 AM Medical Record Number: [id number] Patient Account Number: [id number] Date of Birth/Sex: 01/28/1934 (83 y.o. Male) Treating RN: Montey Hora Primary Care Provider: Delight Stare Other Clinician: Referring Provider: Delight Stare Treating Provider/Extender: Frann Rider in Treatment: 4 Debridement Performed for Wound #1 Right,Lateral Ankle Assessment: Performed By: Physician Christin Fudge, MD Debridement: Debridement Severity of Tissue Pre Fat layer exposed Debridement: Pre-procedure Verification/Time Out Yes - 09:33 Taken: Start Time: 09:33 Pain Control: Lidocaine 4% Topical Solution Level: Skin/Subcutaneous Tissue Total Area Debrided (L x 1.5 (cm) x 2 (cm) = 3 (cm) W): Tissue and other Viable, Non-Viable, Fibrin/Slough, Subcutaneous material debrided: Instrument: Curette Bleeding: Minimum Hemostasis Achieved: Pressure End Time: 09:34 Procedural Pain: 0 Post Procedural Pain: 0 Response to Treatment: Procedure was tolerated  well Post Debridement Measurements of Total Wound Length: (cm) 1.5 Width: (cm) 2 Depth: (cm) 0.2 Volume: (cm) 0.471 Character of Wound/Ulcer Post Improved Debridement: Severity of Tissue Post Debridement: Fat layer exposed Post Procedure Diagnosis Same as Pre-procedure Electronic Signature(s) Signed: 01/29/2017 9:43:48 AM By: Christin Fudge MD, FACS Signed: 01/29/2017 4:42:43 PM By: Montey Hora [name], [name] ([id number]) Entered By: Christin Fudge on 01/29/2017 09:43:47 [name], [name] ([id number]) -------------------------------------------------------------------------------- Debridement Details Patient Name: [name] Date of Service: 01/29/2017 9:15 AM Medical Record Number: [id number] Patient Account Number: [id number] Date of Birth/Sex: 08/26/1933 (83 y.o. Male) Treating RN: Montey Hora Primary Care Provider: Delight Stare Other Clinician: Referring Provider: Delight Stare Treating Provider/Extender: Frann Rider in Treatment: 4 Debridement Performed for Wound #2 Right Malleolus Assessment: Performed By: Physician Christin Fudge, MD Debridement: Debridement Severity of Tissue Pre Fat layer exposed Debridement: Pre-procedure Verification/Time Out Yes - 09:34 Taken: Start Time: 09:34 Pain Control: Lidocaine 4% Topical Solution Level: Skin/Subcutaneous Tissue Total Area Debrided (L x 1.4 (cm) x 1.5 (cm) = 2.1 (cm) W): Tissue and other Viable, Non-Viable, Fibrin/Slough, Subcutaneous material debrided: Instrument: Curette Bleeding: Minimum Hemostasis Achieved: Pressure End Time: 09:35 Procedural Pain: 0 Post Procedural Pain: 0 Response to Treatment: Procedure was tolerated well Post Debridement Measurements of Total Wound Length: (cm) 1.4 Width: (cm) 1.5 Depth: (cm) 0.2 Volume: (cm) 0.33 Character of Wound/Ulcer Post Improved Debridement: Severity of Tissue Post Debridement: Fat layer exposed Post Procedure Diagnosis Same as  Pre-procedure Electronic Signature(s) Signed: 01/29/2017 9:43:56 AM By: Christin Fudge MD, FACS Signed: 01/29/2017 4:42:43 PM By: Montey Hora [name], [name] ([id number]) Entered By: Christin Fudge on 01/29/2017 09:43:55 [name], [name] ([id number]) -------------------------------------------------------------------------------- Debridement Details Patient Name: [name] Date of Service: 01/29/2017 9:15 AM Medical Record Number: [id number] Patient Account Number: [id number] Date of Birth/Sex: 04/09/1934 (83 y.o. Male) Treating RN: Montey Hora Primary Care Provider: Delight Stare Other Clinician: Referring Provider: Delight Stare Treating Provider/Extender: Frann Rider in Treatment: 4 Debridement Performed for Wound #3 Right Toe Third Assessment: Performed By: Physician Christin Fudge, MD Debridement: Debridement Severity of Tissue Pre  Fat layer exposed Debridement: Pre-procedure Verification/Time Out Yes - 09:35 Taken: Start Time: 09:35 Pain Control: Lidocaine 4% Topical Solution Level: Skin/Subcutaneous Tissue Total Area Debrided (L x 1 (cm) x 0.6 (cm) = 0.6 (cm) W): Tissue and other Viable, Non-Viable, Fibrin/Slough, Subcutaneous material debrided: Instrument: Curette Bleeding: Minimum Hemostasis Achieved: Pressure End Time: 09:37 Procedural Pain: 0 Post Procedural Pain: 0 Response to Treatment: Procedure was tolerated well Post Debridement Measurements of Total Wound Length: (cm) 1 Width: (cm) 0.6 Depth: (cm) 0.2 Volume: (cm) 0.094 Character of Wound/Ulcer Post Improved Debridement: Severity of Tissue Post Debridement: Fat layer exposed Post Procedure Diagnosis Same as Pre-procedure Electronic Signature(s) Signed: 01/29/2017 9:44:05 AM By: Christin Fudge MD, FACS Signed: 01/29/2017 4:42:43 PM By: Montey Hora [name], [name] ([id number]) Entered By: Christin Fudge on 01/29/2017 09:44:04 [name], [name]  ([id number]) -------------------------------------------------------------------------------- Debridement Details Patient Name: [name] Date of Service: 01/29/2017 9:15 AM Medical Record Number: [id number] Patient Account Number: [id number] Date of Birth/Sex: 10/01/1933 (83 y.o. Male) Treating RN: Montey Hora Primary Care Provider: Delight Stare Other Clinician: Referring Provider: Delight Stare Treating Provider/Extender: Frann Rider in Treatment: 4 Debridement Performed for Wound #4 Right,Plantar Metatarsal head fifth Assessment: Performed By: Physician Christin Fudge, MD Debridement: Debridement Severity of Tissue Pre Fat layer exposed Debridement: Pre-procedure Verification/Time Out Yes - 09:37 Taken: Start Time: 09:37 Pain Control: Lidocaine 4% Topical Solution Level: Skin/Subcutaneous Tissue Total Area Debrided (L x 0.5 (cm) x 0.7 (cm) = 0.35 (cm) W): Tissue and other Viable, Non-Viable, Fibrin/Slough, Subcutaneous material debrided: Instrument: Curette Bleeding: Minimum Hemostasis Achieved: Pressure End Time: 09:38 Procedural Pain: 0 Post Procedural Pain: 0 Response to Treatment: Procedure was tolerated well Post Debridement Measurements of Total Wound Length: (cm) 0.5 Width: (cm) 0.7 Depth: (cm) 0.2 Volume: (cm) 0.055 Character of Wound/Ulcer Post Improved Debridement: Severity of Tissue Post Debridement: Fat layer exposed Post Procedure Diagnosis Same as Pre-procedure Electronic Signature(s) Signed: 01/29/2017 9:44:13 AM By: Christin Fudge MD, FACS Signed: 01/29/2017 4:42:43 PM By: Montey Hora [name], [name] ([id number]) Entered By: Christin Fudge on 01/29/2017 09:44:13 [name], [name] ([id number]) -------------------------------------------------------------------------------- HPI Details Patient Name: [name] Date of Service: 01/29/2017 9:15 AM Medical Record Number: [id number] Patient Account Number: [id number] Date of  Birth/Sex: 05/11/1934 (83 y.o. Male) Treating RN: Montey Hora Primary Care Provider: Delight Stare Other Clinician: Referring Provider: Delight Stare Treating Provider/Extender: Frann Rider in Treatment: 4 History of Present Illness HPI Description: 12/30/16; this is an 83 year old man who comes from [facility] skilled facility. He comes today unaccompanied and without any history. Looking through [facility] link I am able to see that he was admitted to hospital from 5/25 through 12/04/16 at that point with dehydration, severe hyponatremia acute kidney injury. He was treated with IV fluid replacement including 3% normal saline. Nowhere in his discharge summary did I see any reference to wounds on his feet and is mentioned he does not come in with any history. He does come in with a pack of cigarettes therefore I'm assuming he is a smoker. He is not a listed diabetic. There is some reference in his notes that he has a DSS Education officer, museum is his guardian I'm not completely sure if that is true in any case the patient has some degree of dementia although he is able to tell me that he is 83 years old came from [place]. He is not able to give a history of these wounds. Our intake nurse notes maggots in the right third toe. Beyond this he has an area over  the right lateral malleolus on the right lateral foot. He also has to threatened areas on the base of the right fifth metatarsal and on the lateral aspect of the right fifth metatarsal head. We are completely unable to do ABIs in the clinic largely because when we inflated the cuff he seemed to withdraw the foot. 01/05/17; the patient was seen urgently by Dr. Gilda Crease of vein and vascular. He agreed with our assessment of critical limb ischemia and set him up for an angiogram. Unfortunately the patient was not transported for preoperative evaluation therefore the angiogram has been canceled. I am not exactly sure if and when  this is going to be rescheduled. The patient's wounds look about the same, right lateral ankle right lateral foot third toe. There is also concerning areas over the fifth met head and base of the fifth metatarsal which are not open but are covered by a dark eschar logorrhea and painful. We have been using silver alginate and x-ray of the right foot done at the facility showed no definite radiographic evidence of acute fracture or dislocation there was an old avulsion fracture of the proximal phalanx fifth digit as well as old fractures of the base of the proximal phalanx of the fifth digit. There is no comment about osteomyelitis 01/16/17 I have patient's record from his operative report from [facility] with Dr. Gilda Crease were fortunately patient underwent a successful angioplasty in regard to his right lower extremity. The anterior tibial in-line flow was improved with less than 5% residual stenosis. Angioplasty of the SFA and popliteal arteries also yielded excellent results in post procedure testing with less than 10% residual stenosis. Overall patient appears to have done very well with this procedure. Hopefully this will translate into better wound healing in regard to his right lower extremity wounds. 01/22/2017 -- the patient comes with no dressings appropriate on his right lower extremity and the patient is rather non-verbal and coherent. He has no nursing or caregiver help. [name], [name] ([id number]) Electronic Signature(s) Signed: 01/29/2017 9:44:33 AM By: Evlyn Kanner MD, FACS Entered By: Evlyn Kanner on 01/29/2017 09:44:32 [name], [name] ([id number]) -------------------------------------------------------------------------------- Physical Exam Details Patient Name: [name] Date of Service: 01/29/2017 9:15 AM Medical Record Number: [id number] Patient Account Number: [id number] Date of Birth/Sex: [birth year] (83 y.o. Male) Treating RN: Curtis Sites Primary Care  Provider: Darreld Mclean Other Clinician: Referring Provider: Darreld Mclean Treating Provider/Extender: Rudene Re in Treatment: 4 Constitutional . Pulse regular. Respirations normal and unlabored. Afebrile. . Eyes Nonicteric. Reactive to light. Ears, Nose, Mouth, and Throat Lips, teeth, and gums WNL.Marland Kitchen Moist mucosa without lesions. Neck supple and nontender. No palpable supraclavicular or cervical adenopathy. Normal sized without goiter. Respiratory WNL. No retractions.. Breath sounds WNL, No rubs, rales, rhonchi, or wheeze.. Cardiovascular Heart rhythm and rate regular, no murmur or gallop.. Pedal Pulses WNL. No clubbing, cyanosis or edema. Chest Breasts symmetical and no nipple discharge.. Breast tissue WNL, no masses, lumps, or tenderness.. Lymphatic No adneopathy. No adenopathy. No adenopathy. Musculoskeletal Adexa without tenderness or enlargement.. Digits and nails w/o clubbing, cyanosis, infection, petechiae, ischemia, or inflammatory conditions.. Integumentary (Hair, Skin) No suspicious lesions. No crepitus or fluctuance. No peri-wound warmth or erythema. No masses.Marland Kitchen Psychiatric Judgement and insight Intact.. No evidence of depression, anxiety, or agitation.. Notes the patient's for wounds on the right foot needed sharp debridement with a #3 curet and once this was done brisk bleeding was controlled with pressure. All the wounds have improved a bit. Electronic Signature(s) Signed: 01/29/2017 9:45:01  AM By: Christin Fudge MD, FACS Entered By: Christin Fudge on 01/29/2017 09:45:00 Liscano, [name] ([id number]) -------------------------------------------------------------------------------- Physician Orders Details Patient Name: [name] Date of Service: 01/29/2017 9:15 AM Medical Record Number: [id number] Patient Account Number: [id number] Date of Birth/Sex: 05/23/1934 (83 y.o. Male) Treating RN: Montey Hora Primary Care Provider: Delight Stare Other  Clinician: Referring Provider: Delight Stare Treating Provider/Extender: Frann Rider in Treatment: 4 Verbal / Phone Orders: No Diagnosis Coding Wound Cleansing Wound #1 Right,Lateral Ankle o Clean wound with Normal Saline. o May Shower, gently pat wound dry prior to applying new dressing. Wound #2 Right Malleolus o Clean wound with Normal Saline. o May Shower, gently pat wound dry prior to applying new dressing. Wound #3 Right Toe Third o Clean wound with Normal Saline. o May Shower, gently pat wound dry prior to applying new dressing. Wound #4 Right,Plantar Metatarsal head fifth o Clean wound with Normal Saline. o May Shower, gently pat wound dry prior to applying new dressing. Anesthetic Wound #1 Right,Lateral Ankle o Topical Lidocaine 4% cream applied to wound bed prior to debridement Wound #2 Right Malleolus o Topical Lidocaine 4% cream applied to wound bed prior to debridement Wound #3 Right Toe Third o Topical Lidocaine 4% cream applied to wound bed prior to debridement Wound #4 Right,Plantar Metatarsal head fifth o Topical Lidocaine 4% cream applied to wound bed prior to debridement Primary Wound Dressing Wound #1 Right,Lateral Ankle o Aquacel Ag Wound #2 Right Malleolus o Aquacel Ag Sargeant, [name] ([id number]) Wound #3 Right Toe Third o Aquacel Ag Wound #4 Right,Plantar Metatarsal head fifth o Aquacel Ag Secondary Dressing Wound #1 Right,Lateral Ankle o Gauze and Kerlix/Conform Wound #2 Right Malleolus o Gauze and Kerlix/Conform Wound #3 Right Toe Third o Gauze and Kerlix/Conform Wound #4 Right,Plantar Metatarsal head fifth o Gauze and Kerlix/Conform Dressing Change Frequency Wound #1 Right,Lateral Ankle o Change dressing every day. Wound #2 Right Malleolus o Change dressing every day. Wound #3 Right Toe Third o Change dressing every day. Wound #4 Right,Plantar Metatarsal head fifth o Change dressing  every day. Follow-up Appointments Wound #1 Right,Lateral Ankle o Return Appointment in 1 week. Wound #2 Right Malleolus o Return Appointment in 1 week. Wound #3 Right Toe Third o Return Appointment in 1 week. Wound #4 Right,Plantar Metatarsal head fifth o Return Appointment in 1 week. Additional Orders / Instructions Wound #1 Right,Lateral Ankle Castened[place], [name] ([id number]) o Stop Smoking o Increase protein intake. Wound #2 Right Malleolus o Stop Smoking o Increase protein intake. Wound #3 Right Toe Third o Stop Smoking o Increase protein intake. Wound #4 Right,Plantar Metatarsal head fifth o Stop Smoking o Increase protein intake. Electronic Signature(s) Signed: 01/29/2017 3:53:48 PM By: Christin Fudge MD, FACS Signed: 01/29/2017 4:42:43 PM By: Montey Hora Entered By: Montey Hora on 01/29/2017 09:38:29 [place], [name] ([id number]) -------------------------------------------------------------------------------- Problem List Details Patient Name: [name] Date of Service: 01/29/2017 9:15 AM Medical Record Number: [id number] Patient Account Number: [id number] Date of Birth/Sex: [birth year] (83 y.o. 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.513 Non-pressure chronic ulcer of other part of right foot with 12/30/2016 Yes necrosis of muscle I70.235 Atherosclerosis of native arteries of right leg with 12/30/2016 Yes ulceration  of other part of foot I70.245 Atherosclerosis of native arteries of left leg with ulceration 12/30/2016 Yes of other  part of foot F17.218 Nicotine dependence, cigarettes, with other nicotine- 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 Male) Treating RN: Montey Hora Primary Care Provider: Delight Stare Other Clinician: Referring Provider: Delight Stare Treating Provider/Extender: Frann Rider in Treatment: 4 Subjective Chief Complaint Information obtained from Patient 12/30/16; patient arrives from [place] unaccompanied. He has 3 open wounds on the right foot and 2 threatened areas History of Present Illness (HPI) 12/30/16; this is an 83 year old man who comes from [place] skilled facility. He comes today unaccompanied and without any history. Looking through [facility] link I am able to see that he was admitted to hospital from 5/25 through 12/04/16 at that point with dehydration, severe hyponatremia acute kidney injury. He was treated with IV fluid replacement including 3% normal saline. Nowhere in his discharge summary did I see any reference to wounds on his feet and is mentioned he does not come in with any history. He does come in with a pack of cigarettes therefore I'm assuming he is a smoker. He is not a listed diabetic. There is some reference in his notes that he has a DSS Education officer, museum is his guardian I'm not completely sure if that is true in any case the patient has some degree of dementia although he is able to tell me that he is 83 years old came from [place]. He is not able to give a history of  these wounds. Our intake nurse notes maggots in the right third toe. Beyond this he has an area over the right lateral malleolus on the right lateral foot. He also has to threatened areas on the base of the right fifth metatarsal and on the lateral aspect of the right fifth metatarsal head. We are completely unable to do ABIs in the clinic largely because when we inflated the cuff he seemed to withdraw the foot. 01/05/17; the patient was seen urgently by Dr. Delana Meyer of vein and vascular. He agreed with our assessment of critical limb ischemia and set him up for an angiogram. Unfortunately the patient was not transported for preoperative evaluation therefore the angiogram has been canceled. I am not exactly sure if and when this is going to be rescheduled. The patient's wounds look about the same, right lateral ankle right lateral foot third toe. There is also concerning areas over the fifth met head and base of the fifth metatarsal which are not open but are covered by a dark eschar logorrhea and painful. We have been using silver alginate and x-ray of the right foot done at the facility showed no definite radiographic evidence of acute fracture or dislocation there was an old avulsion fracture of the proximal phalanx fifth digit as well as old fractures of the base of the proximal phalanx of the fifth digit. There is no comment about osteomyelitis 01/16/17 I have patient's record from his operative report from [facility] with Dr. Delana Meyer were fortunately patient underwent a successful angioplasty in regard to his right lower extremity. The anterior tibial in-line flow was improved with less than 5% residual stenosis. Angioplasty of the  SFA and popliteal arteries also yielded excellent results in post procedure testing with less than 10% residual Billiot, [name] ([id number]) stenosis. Overall patient appears to have done very well with this procedure. Hopefully this will translate  into better wound healing in regard to his right lower extremity wounds. 01/22/2017 -- the patient comes with no dressings appropriate on his right lower extremity and the patient is rather non-verbal and coherent. He has no nursing or caregiver help. Objective Constitutional Pulse regular. Respirations normal and unlabored. Afebrile. Vitals Time Taken: 9:13 AM, Temperature: 97.7 F, Pulse: 81 bpm, Respiratory Rate: 16 breaths/min, Blood Pressure: 121/49 mmHg. Eyes Nonicteric. Reactive to light. Ears, Nose, Mouth, and Throat Lips, teeth, and gums WNL.Marland Kitchen Moist mucosa without lesions. Neck supple and nontender. No palpable supraclavicular or cervical adenopathy. Normal sized without goiter. Respiratory WNL. No retractions.. Breath sounds WNL, No rubs, rales, rhonchi, or wheeze.. Cardiovascular Heart rhythm and rate regular, no murmur or gallop.. Pedal Pulses WNL. No clubbing, cyanosis or edema. Chest Breasts symmetical and no nipple discharge.. Breast tissue WNL, no masses, lumps, or tenderness.. Lymphatic No adneopathy. No adenopathy. No adenopathy. Musculoskeletal Adexa without tenderness or enlargement.. Digits and nails w/o clubbing, cyanosis, infection, petechiae, ischemia, or inflammatory conditions.Marland Kitchen Psychiatric Judgement and insight Intact.. No evidence of depression, anxiety, or agitation.Marland Kitchen Hunger, [name] ([id number]) General Notes: the patient's for wounds on the right foot needed sharp debridement with a #3 curet and once this was done brisk bleeding was controlled with pressure. All the wounds have improved a bit. Integumentary (Hair, Skin) No suspicious lesions. No crepitus or fluctuance. No peri-wound warmth or erythema. No masses.. Wound #1 status is Open. Original cause of wound was Gradually Appeared. The wound is located on the Right,Lateral Ankle. The wound measures 1.5cm length x 2cm width x 0.1cm depth; 2.356cm^2 area and 0.236cm^3 volume. There is Fat Layer  (Subcutaneous Tissue) Exposed exposed. There is no tunneling or undermining noted. There is a large amount of serous drainage noted. The wound margin is flat and intact. There is large (67-100%) red granulation within the wound bed. There is a small (1-33%) amount of necrotic tissue within the wound bed including Adherent Slough. The periwound skin appearance did not exhibit: Callus, Crepitus, Excoriation, Induration, Rash, Scarring, Dry/Scaly, Maceration, Atrophie Blanche, Cyanosis, Ecchymosis, Hemosiderin Staining, Mottled, Pallor, Rubor, Erythema. Periwound temperature was noted as No Abnormality. The periwound has tenderness on palpation. Wound #2 status is Open. Original cause of wound was Gradually Appeared. The wound is located on the Right Malleolus. The wound measures 1.4cm length x 1.5cm width x 0.1cm depth; 1.649cm^2 area and 0.165cm^3 volume. There is Fat Layer (Subcutaneous Tissue) Exposed exposed. There is no tunneling or undermining noted. There is a large amount of serous drainage noted. The wound margin is flat and intact. There is large (67-100%) red granulation within the wound bed. There is a small (1-33%) amount of necrotic tissue within the wound bed including Adherent Slough. The periwound skin appearance did not exhibit: Callus, Crepitus, Excoriation, Induration, Rash, Scarring, Dry/Scaly, Maceration, Atrophie Blanche, Cyanosis, Ecchymosis, Hemosiderin Staining, Mottled, Pallor, Rubor, Erythema. Periwound temperature was noted as No Abnormality. The periwound has tenderness on palpation. Wound #3 status is Open. Original cause of wound was Gradually Appeared. The wound is located on the Right Toe Third. The wound measures 1cm length x 0.6cm width x 0.1cm depth; 0.471cm^2 area and 0.047cm^3 volume. There is no tunneling or undermining noted. There is a large amount of purulent drainage noted. The wound margin is flat and intact.  There is large (67-100%) red granulation  within the wound bed. There is a small (1-33%) amount of necrotic tissue within the wound bed including Eschar. The periwound skin appearance did not exhibit: Callus, Crepitus, Excoriation, Induration, Rash, Scarring, Dry/Scaly, Maceration, Atrophie Blanche, Cyanosis, Ecchymosis, Hemosiderin Staining, Mottled, Pallor, Rubor, Erythema. Periwound temperature was noted as No Abnormality. The periwound has tenderness on palpation. Wound #4 status is Open. Original cause of wound was Gradually Appeared. The wound is located on the Right,Plantar Metatarsal head fifth. The wound measures 0.5cm length x 0.7cm width x 0.1cm depth; 0.275cm^2 area and 0.027cm^3 volume. There is no tunneling or undermining noted. There is a large amount of purulent drainage noted. The wound margin is flat and intact. There is large (67-100%) red granulation within the wound bed. There is a small (1-33%) amount of necrotic tissue within the wound bed including Eschar and Adherent Slough. The periwound skin appearance did not exhibit: Callus, Crepitus, Excoriation, Induration, Rash, Scarring, Dry/Scaly, Maceration, Atrophie Blanche, Cyanosis, Ecchymosis, Hemosiderin Staining, Mottled, Pallor, Rubor, Erythema. Periwound temperature was noted as No Abnormality. The periwound has tenderness on palpation. 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 There was a Skin/Subcutaneous Tissue Debridement ([id number]) debridement with total area of 3 sq cm performed by Christin Fudge, MD. with the following instrument(s): Curette to remove Viable and Non-Viable tissue/material including Fibrin/Slough and Subcutaneous after achieving pain control using Lidocaine 4% Topical Solution. A time out was conducted at 09:33, prior to the start of the procedure. A Minimum amount of bleeding was controlled with Pressure. The procedure was tolerated well with a pain level of 0 throughout and a pain level of 0 following the procedure. Post Debridement Measurements: 1.5cm length x 2cm width x 0.2cm depth; 0.471cm^3 volume. Character of Wound/Ulcer Post Debridement is improved. Severity of Tissue Post Debridement is: Fat layer exposed. Post procedure Diagnosis Wound #1: Same as Pre-Procedure Wound #2 Pre-procedure diagnosis of 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 There was a Skin/Subcutaneous Tissue Debridement ([id number]) debridement with total area of 2.1 sq cm performed by Christin Fudge, MD. with the following instrument(s): Curette to remove Viable and Non-Viable tissue/material including Fibrin/Slough and Subcutaneous after achieving pain control using Lidocaine 4% Topical Solution. A time out was conducted at 09:34, prior to the start of the procedure. A Minimum amount of bleeding was controlled with Pressure. The procedure was tolerated well with a pain level of 0 throughout and a pain level of 0 following the procedure. Post Debridement Measurements: 1.4cm length x 1.5cm width x 0.2cm depth; 0.33cm^3 volume. Character of Wound/Ulcer Post Debridement is improved. Severity of Tissue Post Debridement is: Fat  layer exposed. Post procedure Diagnosis Wound #2: Same as Pre-Procedure Wound #3 Pre-procedure diagnosis of Wound #3 is an Arterial Insufficiency Ulcer located  on the Right [employer], DARROL ([id number]) Third .Severity of Tissue Pre Debridement is: Fat layer exposed. There was a Skin/Subcutaneous Tissue Debridement ([id number]) debridement with total area of 0.6 sq cm performed by Christin Fudge, MD. with the following instrument(s): Curette to remove Viable and Non-Viable tissue/material including Fibrin/Slough and Subcutaneous after achieving pain control using Lidocaine 4% Topical Solution. A time out was conducted at 09:35, prior to the start of the procedure. A Minimum amount of bleeding was controlled with Pressure. The procedure was tolerated well with a pain level of 0 throughout and a pain level of 0 following the procedure. Post Debridement Measurements: 1cm length x 0.6cm width x 0.2cm depth; 0.094cm^3 volume. Character of Wound/Ulcer Post Debridement is improved. Severity of Tissue Post Debridement is: Fat layer exposed. 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 There was a Skin/Subcutaneous Tissue Debridement ([id number]) debridement with total area of 0.35 sq cm performed by Christin Fudge, MD. with the following instrument(s): Curette to remove Viable and Non-Viable tissue/material including Fibrin/Slough and Subcutaneous after achieving pain control using Lidocaine 4% Topical Solution. A time out was conducted at 09:37, prior to the start of the procedure. A Minimum amount of bleeding was controlled with Pressure. The procedure was tolerated well with a pain level of 0 throughout and a pain level of 0 following the procedure. Post Debridement Measurements: 0.5cm length x  0.7cm width x 0.2cm depth; 0.055cm^3 volume. Character of Wound/Ulcer Post Debridement is improved. Severity of Tissue Post Debridement is: Fat layer exposed. Post procedure Diagnosis Wound #4: Same as Pre-Procedure Plan Wound Cleansing: Wound #1 Right,Lateral Ankle: Clean wound with Normal Saline. May Shower, gently pat wound dry prior to applying new dressing. Wound #2 Right Malleolus: Clean wound with Normal Saline. May Shower, gently pat wound dry prior to applying new dressing. Wound #3 Right Toe Third: Clean wound with Normal Saline. May Shower, gently pat wound dry prior to applying new dressing. Wound #4 Right,Plantar Metatarsal head fifth: Clean wound with Normal Saline. May Shower, gently pat wound dry prior to applying new dressing. Anesthetic: Wound #1 Right,Lateral Ankle: Topical Lidocaine 4% cream applied to wound bed prior to debridement Vangilder, [name] ([id number]) Wound #2 Right Malleolus: Topical Lidocaine 4% cream applied to wound bed prior to debridement Wound #3 Right Toe Third: Topical Lidocaine 4% cream applied to wound bed prior to debridement Wound #4 Right,Plantar Metatarsal head fifth: Topical Lidocaine 4% cream applied to wound bed prior to debridement Primary Wound Dressing: Wound #1 Right,Lateral Ankle: Aquacel Ag Wound #2 Right Malleolus: Aquacel Ag Wound #3 Right Toe Third: Aquacel Ag Wound #4 Right,Plantar Metatarsal head fifth: Aquacel Ag Secondary Dressing: Wound #1 Right,Lateral Ankle: Gauze and Kerlix/Conform Wound #2 Right Malleolus: Gauze and Kerlix/Conform Wound #3 Right Toe Third: Gauze and Kerlix/Conform Wound #4 Right,Plantar Metatarsal head fifth: Gauze and Kerlix/Conform Dressing Change Frequency: Wound #1 Right,Lateral Ankle: Change dressing every day. Wound #2 Right Malleolus: Change dressing every day. Wound #3 Right Toe Third: Change dressing every day. Wound #4 Right,Plantar Metatarsal head fifth: Change dressing  every day. Follow-up Appointments: Wound #1 Right,Lateral Ankle: Return Appointment in 1 week. Wound #2 Right Malleolus: Return Appointment in 1 week. Wound #3 Right Toe Third: Return Appointment in 1 week. Wound #4 Right,Plantar Metatarsal head fifth: Return Appointment in 1 week. Additional Orders / Instructions: Wound #1 Right,Lateral Ankle:  Stop Smoking Increase protein intake. Wound #2 Right Malleolus: Stop Smoking Increase protein intake. Wound #3 Right Toe Third: Stop Smoking Gladden, [name] ([id number]) Increase protein intake. Wound #4 Right,Plantar Metatarsal head fifth: Stop Smoking Increase protein intake. After review of the patient's wounds today and having been sharp debridement I have recommended: 1. we use silver alginate overall these areas and offload them appropriately. 2. stopping smoking has been reiterated 3. I have not spent time discussing nutrition and vitamin as I do not believe he comprehends we will also communicate with the nursing staff at the facility to make sure they do appropriate dressing changes Electronic Signature(s) Signed: 01/29/2017 9:46:36 AM By: Christin Fudge MD, FACS Previous Signature: 01/29/2017 9:46:14 AM Version By: Christin Fudge MD, FACS Entered By: Christin Fudge on 01/29/2017 09:46:36 Lina, [name] ([id number]) -------------------------------------------------------------------------------- SuperBill Details Patient Name: Allshouse, [name] Date of Service: 01/29/2017 Medical Record Number: [id number] Patient Account Number: [id number] Date of Birth/Sex: 21-Dec-1933 (83 y.o. 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 F17.218 Nicotine dependence, cigarettes, with other nicotine-induced disorders Facility Procedures CPT4: Description Modifier Quantity Code [id number] 11042 - DEB SUBQ TISSUE 20 SQ CM/< 1 ICD-10 Description Diagnosis L97.311 Non-pressure chronic ulcer of right ankle limited to breakdown of skin L97.513 Non-pressure chronic ulcer of other part of right  foot with necrosis of muscle 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 Physician Procedures CPT4: Description Modifier Quantity Code [id number] 11042 - WC PHYS SUBQ TISS 20 SQ CM 1 ICD-10 Description Diagnosis L97.311 Non-pressure chronic ulcer of right ankle limited to breakdown of skin L97.513 Non-pressure chronic ulcer of other part of right  foot with necrosis of muscle I70.235 Atherosclerosis of native arteries of right leg with ulceration of other part of foot I70.245 Mirando, [name] ([id number]) Electronic Signature(s) Signed: 01/29/2017 9:46:48 AM By: Christin Fudge MD, FACS Entered By: Christin Fudge on 01/29/2017 09:46:48

## 2017-01-31 NOTE — Progress Notes (Signed)
[name], [name] Visit Report for 01/29/2017 Arrival Information Details Patient Name: [name] Date of Service: 01/29/2017 9:15 AM Medical Record Number: [id number] Patient Account Number: [id number] Date of Birth/Sex: 09/18/1933 [age] y.o. Male) Treating RN: Curtis Sitesorthy, Joanna Primary Care [name] Other Clinician: Referring Alanya Vukelich: [name] Treating Braylyn Kalter/Extender: Rudene [name]: 4 Visit Information History Since Last Visit Added or deleted any medications: No Patient Arrived: Wheel Chair Any new allergies or adverse reactions: No Arrival Time: 09:11 Had a fall or experienced change in No activities of daily living that may affect Accompanied By: self risk of falls: Transfer Assistance: Manual Signs or symptoms of abuse/neglect since last No Patient Identification Verified: Yes visito Secondary Verification Process Yes Hospitalized since last visit: No Completed: Has Dressing in Place as Prescribed: Yes Pain Present Now: No Electronic Signature(s) Signed: 01/29/2017 4:42:43 PM By: Curtis Sitesorthy, Joanna Entered By: Curtis Sitesorthy, Joanna on 01/29/2017 09:12:15 [name] ([id number]) -------------------------------------------------------------------------------- Encounter Discharge Information Details Patient Name: [name] Date of Service: 01/29/2017 9:15 AM Medical Record Number: [id number] Patient Account Number: [id number] Date of Birth/Sex: 09/18/1933 [age] y.o. Male) Treating RN: Curtis Sitesorthy, Joanna Primary Care Ritvik Mczeal: [name] Other Clinician: Referring Alverna Fawley: [name] Treating Omie Ferger/Extender: Rudene [name]: 4 Encounter Discharge Information Items Discharge Pain Level: 0 Discharge Condition: Stable Ambulatory Status: Wheelchair Discharge Destination: Nursing Home Transportation: Private Auto Accompanied By: self Schedule Follow-up Appointment: Yes Medication Reconciliation completed and  provided to Patient/Care No Brittin Belnap: Provided on Clinical Summary of Care: 01/29/2017 Form Type Recipient Paper Patient LP Electronic Signature(s) Signed: 01/29/2017 11:53:12 AM By: Curtis Sitesorthy, Joanna Previous Signature: 01/29/2017 9:53:24 AM Version By: Gwenlyn PerkingMoore, Shelia Entered By: Curtis Sitesorthy, Joanna on 01/29/2017 11:53:11 [name] ([id number]) -------------------------------------------------------------------------------- Lower Extremity Assessment Details Patient Name: [name] Date of Service: 01/29/2017 9:15 AM Medical Record Number: [id number] Patient Account Number: [id number] Date of Birth/Sex: 09/18/1933 5(83 y.o. Male) Treating RN: Curtis Sitesorthy, Joanna Primary Care Merleen Picazo: [name] Other Clinician: Referring Aaren Atallah: [name] Treating Kearston Putman/Extender: Rudene [name]: 4 Vascular Assessment Pulses: Dorsalis Pedis Palpable: [Right:No] Posterior Tibial Extremity colors, hair growth, and conditions: Hair Growth on Extremity: [Right:No] Temperature of Extremity: [Right:Warm] Capillary Refill: [Right:< 3 seconds] Electronic Signature(s) Signed: 01/29/2017 11:51:23 AM By: Curtis Sitesorthy, Joanna Entered By: Curtis Sitesorthy, Joanna on 01/29/2017 11:51:23 [name] ([id number]) -------------------------------------------------------------------------------- Multi Wound Chart Details Patient Name: [name] Date of Service: 01/29/2017 9:15 AM Medical Record Number: [id number] Patient Account Number: [id number] Date of Birth/Sex: 09/18/1933 [age] y.o. 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] Comorbid History: [1:Hypertension, Peripheral Hypertension, Peripheral Hypertension, Peripheral Venous Disease, Gout, Osteoarthritis] [2:Venous Disease, Gout, Osteoarthritis] [3:Venous Disease, Gout, Osteoarthritis] Date Acquired: [1:12/04/2016] [2:12/04/2016] [3:12/04/2016] [name] of [name]: [1:4] [2:4] [3:4] Wound Status: [1:Open] [2:Open] [3:Open] Pending Amputation on Yes [2:Yes] [3:Yes] Presentation: Measurements L x W x D 1.5x2x0.1 [2:1.4x1.5x0.1] [3:1x0.6x0.1] (cm) Area (cm) : [1:2.356] [2:1.649] [3:0.471] Volume (cm) : [1:0.236] [2:0.165] [3:0.047] % Reduction in Area: [1:58.80%] [2:67.70%] [3:87.00%] % Reduction in Volume: 86.20% [2:83.80%] [3:93.50%] Classification: [1:Full Thickness With Exposed Support Structures] [2:Full Thickness With Exposed Support Structures] [3:Full Thickness Without Exposed  Support Structures] Exudate Amount: [1:Large] [2:Large] [3:Large] Exudate Type: [1:Serous] [2:Serous] [3:Purulent] Exudate Color: [1:amber] [2:amber] [3:yellow, brown, green] Foul Odor After [1:No] [2:No] [3:Yes] Cleansing: Odor Anticipated Due to N/A [2:N/A] [3:No] Product Use: Wound Margin: [1:Flat and Intact] [2:Flat and Intact] [3:Flat and Intact] Granulation Amount: [1:Large (67-100%)] [2:Large (67-100%)] [3:Large (67-100%)] Granulation Quality: Red Red Red Necrotic Amount: Small (1-33%) Small (1-33%) Small (1-33%) Necrotic Tissue: Adherent Slough Adherent Slough Eschar Exposed Structures: Fat Layer (Subcutaneous Fat Layer (Subcutaneous Fascia: No Tissue) Exposed: Yes Tissue) Exposed: Yes Fat Layer (Subcutaneous Fascia: No Fascia: No Tissue) Exposed: No Tendon: No Tendon: No Tendon: No Muscle:  No Muscle: No Muscle: No Joint: No Joint: No Joint: No Bone: No Bone: No Bone: No Epithelialization: None None None Debridement: Debridement ([id number]- Debridement ([id number]- Debridement (11042- 11047) 11047) 11047) Pre-procedure 09:33 09:34 09:35 Verification/Time Out Taken: Pain Control: Lidocaine 4% Topical Lidocaine 4% Topical Lidocaine 4% Topical Solution Solution Solution Tissue Debrided: Fibrin/Slough, Fibrin/Slough, Fibrin/Slough, Subcutaneous Subcutaneous Subcutaneous Level: Skin/Subcutaneous Skin/Subcutaneous Skin/Subcutaneous Tissue Tissue Tissue Debridement Area (sq 3 2.1 0.6 cm): Instrument: Curette Curette Curette Bleeding: Minimum Minimum Minimum Hemostasis Achieved: Pressure Pressure Pressure Procedural Pain: 0 0 0 Post Procedural Pain: 0 0 0 Debridement [name] Procedure was tolerated Procedure was tolerated Procedure was tolerated Response: well well well Post Debridement 1.5x2x0.2 1.4x1.5x0.2 1x0.6x0.2 Measurements L x W x D (cm) Post Debridement 0.471 0.33 0.094 Volume: (cm) Periwound Skin Texture: Excoriation: No Excoriation: No Excoriation: No Induration: No Induration: No Induration: No Callus: No Callus: No Callus: No Crepitus: No Crepitus: No Crepitus: No Rash: No Rash: No Rash: No Scarring: No Scarring: No Scarring: No Periwound Skin Maceration: No Maceration: No Maceration: No Moisture: Dry/Scaly: No Dry/Scaly: No Dry/Scaly: No Periwound Skin Color: Atrophie Blanche: No Atrophie Blanche: No Atrophie Blanche: No Cyanosis: No Cyanosis: No Cyanosis: No Ecchymosis: No Ecchymosis: No Ecchymosis: No Erythema: No Erythema: No Erythema: No Hemosiderin Staining: No Hemosiderin Staining: No Hemosiderin Staining: No Mottled: No Mottled: No Mottled: No [name], [name] ([id number]) Pallor: No Pallor: No Pallor: No Rubor: No Rubor: No Rubor: No Temperature: No Abnormality No Abnormality No Abnormality Tenderness on Yes Yes  Yes Palpation: Wound Preparation: Ulcer Cleansing: Ulcer Cleansing: Ulcer Cleansing: Rinsed/Irrigated with Rinsed/Irrigated with Rinsed/Irrigated with Saline Saline Saline Topical Anesthetic Topical Anesthetic Topical Anesthetic Applied: Other: lidocaine Applied: Other: lidocaine Applied: Other: lidocaine 4% 4% 4% Procedures Performed: Debridement Debridement Debridement Wound Number: 4 N/A N/A Photos: No Photos N/A N/A Wound Location: Right Metatarsal head fifth N/A N/A - Plantar Wounding Event: Gradually Appeared N/A N/A Primary Etiology: Arterial Insufficiency Ulcer N/A N/A Comorbid History: Hypertension, Peripheral N/A N/A Venous Disease, Gout, Osteoarthritis Date Acquired: 01/16/2017 N/A N/A [name] of [name]: 1 N/A N/A Wound Status: Open N/A N/A Pending Amputation on No N/A N/A Presentation: Measurements L x W x D 0.5x0.7x0.1 N/A N/A (cm) Area (cm) : 0.275 N/A N/A Volume (cm) : 0.027 N/A N/A % Reduction in Area: 79.30% N/A N/A % Reduction in Volume: 79.70% N/A N/A Classification: Full Thickness Without N/A N/A Exposed Support Structures Exudate Amount: Large N/A N/A Exudate Type: Purulent N/A N/A Exudate Color: yellow, brown, green N/A N/A Foul Odor After Yes N/A N/A Cleansing: Odor Anticipated Due to No N/A N/A Product Use: Wound Margin: Flat and Intact N/A N/A Granulation Amount: Large (67-100%) N/A N/A Granulation Quality: Red N/A N/A Necrotic Amount: Small (1-33%) N/A N/A Necrotic Tissue: Eschar, Adherent Slough N/A N/A [name], [place] ([id number]) Exposed Structures: Fascia: No N/A N/A Fat Layer (Subcutaneous Tissue) Exposed: No Tendon: No  Muscle: No Joint: No Bone: No Epithelialization: None N/A N/A Debridement: Debridement (16109- N/A N/A 11047) Pre-procedure 09:37 N/A N/A Verification/Time Out Taken: Pain Control: Lidocaine 4% Topical N/A N/A Solution Tissue Debrided: Fibrin/Slough, N/A N/A Subcutaneous Level: Skin/Subcutaneous N/A  N/A Tissue Debridement Area (sq 0.35 N/A N/A cm): Instrument: Curette N/A N/A Bleeding: Minimum N/A N/A Hemostasis Achieved: Pressure N/A N/A Procedural Pain: 0 N/A N/A Post Procedural Pain: 0 N/A N/A Debridement [name] Procedure was tolerated N/A N/A Response: well Post Debridement 0.5x0.7x0.2 N/A N/A Measurements L x W x D (cm) Post Debridement 0.055 N/A N/A Volume: (cm) Periwound Skin Texture: Excoriation: No N/A N/A Induration: No Callus: No Crepitus: No Rash: No Scarring: No Periwound Skin Maceration: No N/A N/A Moisture: Dry/Scaly: No Periwound Skin Color: Atrophie Blanche: No N/A N/A Cyanosis: No Ecchymosis: No Erythema: No Hemosiderin Staining: No Mottled: No Pallor: No Rubor: No Temperature: No Abnormality N/A N/A [name], [name] ([id number]) Tenderness on Yes N/A N/A Palpation: Wound Preparation: Ulcer Cleansing: N/A N/A Rinsed/Irrigated with Saline Topical Anesthetic Applied: Other: lidocaine 4% Procedures Performed: Debridement N/A N/A [name] Notes Electronic Signature(s) Signed: 01/29/2017 9:43:40 AM By: Evlyn Kanner MD, FACS Entered By: Evlyn Kanner on 01/29/2017 09:43:39 Mcginn, [name] ([id number]) -------------------------------------------------------------------------------- Multi-Disciplinary Care Plan Details Patient Name: [name] Date of Service: 01/29/2017 9:15 AM Medical Record Number: [id number] Patient Account Number: [id number] Date of Birth/Sex: 02-02-1934 (83 y.o. Male) Treating RN: Curtis Sites Primary Care Geno Sydnor: [name] Mclean Other Clinician: Referring Revecca Nachtigal: [name] Mclean Treating Brylee Berk/Extender: Rudene Re in [name]: 4 Active Inactive ` Abuse / Safety / Falls / Self Care Management Nursing Diagnoses: Potential for falls Goals: Patient will not experience any injury related to falls Date Initiated: 12/30/2016 Target Resolution Date: 02/27/2017 Goal Status: Active Interventions: Assess  fall risk on admission and as needed Notes: ` Orientation to the Wound Care Program Nursing Diagnoses: Knowledge deficit related to the wound healing center program Goals: Patient/caregiver will verbalize understanding of the Wound Healing Center Program Date Initiated: 12/30/2016 Target Resolution Date: 02/27/2017 Goal Status: Active Interventions: Provide education on orientation to the wound center Notes: ` Wound/Skin Impairment Nursing Diagnoses: Impaired tissue integrity [name], [name] ([id number]) Goals: Ulcer/skin breakdown will have a volume reduction of 30% by week 4 Date Initiated: 12/30/2016 Target Resolution Date: 02/27/2017 Goal Status: Active Ulcer/skin breakdown will have a volume reduction of 50% by week 8 Date Initiated: 12/30/2016 Target Resolution Date: 02/27/2017 Goal Status: Active Ulcer/skin breakdown will have a volume reduction of 80% by week 12 Date Initiated: 12/30/2016 Target Resolution Date: 02/27/2017 Goal Status: Active Ulcer/skin breakdown will heal within 14 [name] Date Initiated: 12/30/2016 Target Resolution Date: 02/27/2017 Goal Status: Active Interventions: Assess patient/caregiver ability to obtain necessary supplies Assess patient/caregiver ability to perform ulcer/skin care regimen upon admission and as needed Assess ulceration(s) every visit Notes: Electronic Signature(s) Signed: 01/29/2017 4:42:43 PM By: Curtis Sites Entered By: Curtis Sites on 01/29/2017 09:33:40 [name], [name] ([id number]) -------------------------------------------------------------------------------- Pain Assessment Details Patient Name: [name] Date of Service: 01/29/2017 9:15 AM Medical Record Number: [id number] Patient Account Number: [id number] Date of Birth/Sex: [birth year] (83 y.o. Male) Treating RN: Curtis Sites Primary Care Adely Facer: [name] Mclean Other Clinician: Referring Teighlor Korson: [name] Mclean Treating Arienna Benegas/Extender: Rudene Re in  [name]: 4 Active Problems Location of Pain Severity and Description of Pain Patient Has Paino No Site Locations Pain Management and Medication Current Pain Management: Notes Topical or injectable lidocaine is offered to patient for acute pain when surgical debridement is performed. If needed, Patient is instructed to use over the  counter pain medication for the following 24-48 hours after debridement. Wound care MDs do not prescribed pain medications. Patient has chronic pain or uncontrolled pain. Patient has been instructed to make an appointment with their Primary Care Physician for pain management. Electronic Signature(s) Signed: 01/29/2017 4:42:43 PM By: Curtis Sites Entered By: Curtis Sites on 01/29/2017 09:12:37 [name], [name] ([id number]) -------------------------------------------------------------------------------- Patient/Caregiver Education Details Patient Name: [name] Date of Service: 01/29/2017 9:15 AM Medical Record Number: [id number] Patient Account Number: [id number] Date of Birth/Gender: [birth year] (83 y.o. Male) Treating RN: Curtis Sites Primary Care Physician: [name] Mclean Other Clinician: Referring Physician: Darreld Mclean Treating Physician/Extender: Rudene Re in [name]: 4 Education Assessment Education Provided To: Caregiver SNF nurses via written instructions Education Topics Provided Wound/Skin Impairment: Handouts: Other: wound care orders Methods: Printed Electronic Signature(s) Signed: 01/29/2017 4:42:43 PM By: Curtis Sites Entered By: Curtis Sites on 01/29/2017 11:53:39 Wickard, [name] ([id number]) -------------------------------------------------------------------------------- Wound Assessment Details Patient Name: [name] Date of Service: 01/29/2017 9:15 AM Medical Record Number: [id number] Patient Account Number: [id number] Date of Birth/Sex: 10-Oct-1933 (83 y.o. Male) Treating RN: Curtis Sites Primary Care  Henry Utsey: [name] Mclean Other Clinician: Referring Shanekqua Schaper: [name] Mclean Treating Deantre Bourdon/Extender: Rudene Re in [name]: 4 Wound Status Wound Number: 1 Primary Arterial Insufficiency Ulcer Etiology: Wound Location: Right Ankle - Lateral Wound Open Wounding Event: Gradually Appeared Status: Date Acquired: 12/04/2016 Comorbid Hypertension, Peripheral Venous [name] Of [name]: 4 History: Disease, Gout, Osteoarthritis Clustered Wound: No Pending Amputation On Presentation Photos Photo Uploaded By: Curtis Sites on 01/29/2017 11:45:52 Wound Measurements Length: (cm) 1.5 % Reduction in Width: (cm) 2 % Reduction in Depth: (cm) 0.1 Epithelializati Area: (cm) 2.356 Tunneling: Volume: (cm) 0.236 Undermining: Area: 58.8% Volume: 86.2% on: None No No Wound Description Full Thickness With Exposed Foul Odor After Classification: Support Structures Slough/Fibrino Wound Margin: Flat and Intact Exudate Large Amount: Exudate Type: Serous Exudate Color: amber Cleansing: No Yes Wound Bed Granulation Amount: Large (67-100%) Exposed Structure Granulation Quality: Red Fascia Exposed: No Gutmann, [name] ([id number]) Necrotic Amount: Small (1-33%) Fat Layer (Subcutaneous Tissue) Exposed: Yes Necrotic Quality: Adherent Slough Tendon Exposed: No Muscle Exposed: No Joint Exposed: No Bone Exposed: No Periwound Skin Texture Texture Color No Abnormalities Noted: No No Abnormalities Noted: No Callus: No Atrophie Blanche: No Crepitus: No Cyanosis: No Excoriation: No Ecchymosis: No Induration: No Erythema: No Rash: No Hemosiderin Staining: No Scarring: No Mottled: No Pallor: No Moisture Rubor: No No Abnormalities Noted: No Dry / Scaly: No Temperature / Pain Maceration: No Temperature: No Abnormality Tenderness on Palpation: Yes Wound Preparation Ulcer Cleansing: Rinsed/Irrigated with Saline Topical Anesthetic Applied: Other: lidocaine 4%, [name]  Notes Wound #1 (Right, Lateral Ankle) 1. Cleansed with: Clean wound with Normal Saline 2. Anesthetic Topical Lidocaine 4% cream to wound bed prior to debridement 4. Dressing Applied: Aquacel Ag 5. Secondary Dressing Applied Guaze, ABD and kerlix/Conform 7. Secured with Tape Notes netting Electronic Signature(s) Signed: 01/29/2017 4:42:43 PM By: Curtis Sites Entered By: Curtis Sites on 01/29/2017 09:31:22 Lortz, [name] ([id number]) -------------------------------------------------------------------------------- Wound Assessment Details Patient Name: [name] Date of Service: 01/29/2017 9:15 AM Medical Record Number: [id number] Patient Account Number: [id number] Date of Birth/Sex: Oct 11, 1933 (83 y.o. Male) Treating RN: Curtis Sites Primary Care Miyani Cronic: [name] Mclean Other Clinician: Referring Annamaria Salah: [name] Mclean Treating Trueman Worlds/Extender: Rudene Re in [name]: 4 Wound Status Wound Number: 2 Primary Arterial Insufficiency Ulcer Etiology: Wound Location: Right Malleolus Wound Open Wounding Event: Gradually Appeared Status: Date Acquired: 12/04/2016 Comorbid Hypertension, Peripheral Venous [name] Of [name]: 4 History: Disease, Gout, Osteoarthritis Clustered  Wound: No Pending Amputation On Presentation Photos Photo Uploaded By: Curtis Sites on 01/29/2017 11:46:23 Wound Measurements Length: (cm) 1.4 % Reduction in Width: (cm) 1.5 % Reduction in Depth: (cm) 0.1 Epithelializati Area: (cm) 1.649 Tunneling: Volume: (cm) 0.165 Undermining: Area: 67.7% Volume: 83.8% on: None No No Wound Description Full Thickness With Exposed Foul Odor After Classification: Support Structures Slough/Fibrino Wound Margin: Flat and Intact Exudate Large Amount: Exudate Type: Serous Exudate Color: amber Cleansing: No Yes Wound Bed Granulation Amount: Large (67-100%) Exposed Structure Granulation Quality: Red Fascia Exposed: No Kott, [name]  ([id number]) Necrotic Amount: Small (1-33%) Fat Layer (Subcutaneous Tissue) Exposed: Yes Necrotic Quality: Adherent Slough Tendon Exposed: No Muscle Exposed: No Joint Exposed: No Bone Exposed: No Periwound Skin Texture Texture Color No Abnormalities Noted: No No Abnormalities Noted: No Callus: No Atrophie Blanche: No Crepitus: No Cyanosis: No Excoriation: No Ecchymosis: No Induration: No Erythema: No Rash: No Hemosiderin Staining: No Scarring: No Mottled: No Pallor: No Moisture Rubor: No No Abnormalities Noted: No Dry / Scaly: No Temperature / Pain Maceration: No Temperature: No Abnormality Tenderness on Palpation: Yes Wound Preparation Ulcer Cleansing: Rinsed/Irrigated with Saline Topical Anesthetic Applied: Other: lidocaine 4%, [name] Notes Wound #2 (Right Malleolus) 1. Cleansed with: Clean wound with Normal Saline 2. Anesthetic Topical Lidocaine 4% cream to wound bed prior to debridement 4. Dressing Applied: Aquacel Ag 5. Secondary Dressing Applied Guaze, ABD and kerlix/Conform 7. Secured with Tape Notes netting Electronic Signature(s) Signed: 01/29/2017 4:42:43 PM By: Curtis Sites Entered By: Curtis Sites on 01/29/2017 09:32:24 Sagar, [name] ([id number]) -------------------------------------------------------------------------------- Wound Assessment Details Patient Name: Spainhower, [name] Date of Service: 01/29/2017 9:15 AM Medical Record Number: [id number] Patient Account Number: [id number] Date of Birth/Sex: January 22, 1934 (83 y.o. Male) Treating RN: Curtis Sites Primary Care Travares Nelles: [name] Mclean Other Clinician: Referring Bassheva Flury: [name] Mclean Treating Anely Spiewak/Extender: Rudene Re in [name]: 4 Wound Status Wound Number: 3 Primary Arterial Insufficiency Ulcer Etiology: Wound Location: Right Toe Third Wound Open Wounding Event: Gradually Appeared Status: Date Acquired: 12/04/2016 Comorbid Hypertension, Peripheral  Venous [name] Of [name]: 4 History: Disease, Gout, Osteoarthritis Clustered Wound: No Pending Amputation On Presentation Photos Photo Uploaded By: Curtis Sites on 01/29/2017 11:46:23 Wound Measurements Length: (cm) 1 % Reduction in Width: (cm) 0.6 % Reduction in Depth: (cm) 0.1 Epithelializati Area: (cm) 0.471 Tunneling: Volume: (cm) 0.047 Undermining: Area: 87% Volume: 93.5% on: None No No Wound Description Full Thickness Without Exposed Foul Odor After Classification: Support Structures Due to Product Wound Margin: Flat and Intact Slough/Fibrino Exudate Large Amount: Exudate Type: Purulent Exudate Color: yellow, brown, green Cleansing: Yes Use: No No Wound Bed Granulation Amount: Large (67-100%) Exposed Structure Granulation Quality: Red Fascia Exposed: No Hoopes, [name] ([id number]) Necrotic Amount: Small (1-33%) Fat Layer (Subcutaneous Tissue) Exposed: No Necrotic Quality: Eschar Tendon Exposed: No Muscle Exposed: No Joint Exposed: No Bone Exposed: No Periwound Skin Texture Texture Color No Abnormalities Noted: No No Abnormalities Noted: No Callus: No Atrophie Blanche: No Crepitus: No Cyanosis: No Excoriation: No Ecchymosis: No Induration: No Erythema: No Rash: No Hemosiderin Staining: No Scarring: No Mottled: No Pallor: No Moisture Rubor: No No Abnormalities Noted: No Dry / Scaly: No Temperature / Pain Maceration: No Temperature: No Abnormality Tenderness on Palpation: Yes Wound Preparation Ulcer Cleansing: Rinsed/Irrigated with Saline Topical Anesthetic Applied: Other: lidocaine 4%, [name] Notes Wound #3 (Right Toe Third) 1. Cleansed with: Clean wound with Normal Saline 2. Anesthetic Topical Lidocaine 4% cream to wound bed prior to debridement 4. Dressing Applied: Aquacel Ag 5. Secondary Dressing Applied Guaze, ABD and kerlix/Conform  7. Secured with Tape Notes netting Electronic Signature(s) Signed: 01/29/2017  4:42:43 PM By: Curtis Sites Entered By: Curtis Sites on 01/29/2017 09:33:00 Cohenour, [name] ([id number]) -------------------------------------------------------------------------------- Wound Assessment Details Patient Name: Skilling, [name] Date of Service: 01/29/2017 9:15 AM Medical Record Number: [id number] Patient Account Number: [id number] Date of Birth/Sex: September 10, 1933 (83 y.o. Male) Treating RN: Curtis Sites Primary Care Wildon Cuevas: [name] Mclean Other Clinician: Referring Maleik Vanderzee: [name] Mclean Treating Morayo Leven/Extender: Rudene Re in [name]: 4 Wound Status Wound Number: 4 Primary Arterial Insufficiency Ulcer Etiology: Wound Location: Right Metatarsal head fifth - Plantar Wound Open Status: Wounding Event: Gradually Appeared Comorbid Hypertension, Peripheral Venous Date Acquired: 01/16/2017 History: Disease, Gout, Osteoarthritis [name] Of [name]: 1 Clustered Wound: No Photos Photo Uploaded By: Curtis Sites on 01/29/2017 11:46:55 Wound Measurements Length: (cm) 0.5 % Reduction i Width: (cm) 0.7 % Reduction i Depth: (cm) 0.1 Epithelializa Area: (cm) 0.275 Tunneling: Volume: (cm) 0.027 Undermining: n Area: 79.3% n Volume: 79.7% tion: None No No Wound Description Full Thickness Without Exposed Foul Odor Aft Classification: Support Structures Due to Produc Wound Margin: Flat and Intact Slough/Fibrin Exudate Large Amount: Exudate Type: Purulent Exudate Color: yellow, brown, green er Cleansing: Yes t Use: No o Yes Wound Bed Granulation Amount: Large (67-100%) Exposed Structure Granulation Quality: Red Fascia Exposed: No Heatley, [name] ([id number]) Necrotic Amount: Small (1-33%) Fat Layer (Subcutaneous Tissue) Exposed: No Necrotic Quality: Eschar, Adherent Slough Tendon Exposed: No Muscle Exposed: No Joint Exposed: No Bone Exposed: No Periwound Skin Texture Texture Color No Abnormalities Noted: No No Abnormalities Noted: No Callus:  No Atrophie Blanche: No Crepitus: No Cyanosis: No Excoriation: No Ecchymosis: No Induration: No Erythema: No Rash: No Hemosiderin Staining: No Scarring: No Mottled: No Pallor: No Moisture Rubor: No No Abnormalities Noted: No Dry / Scaly: No Temperature / Pain Maceration: No Temperature: No Abnormality Tenderness on Palpation: Yes Wound Preparation Ulcer Cleansing: Rinsed/Irrigated with Saline Topical Anesthetic Applied: Other: lidocaine 4%, [name] Notes Wound #4 (Right, Plantar Metatarsal head fifth) 1. Cleansed with: Clean wound with Normal Saline 2. Anesthetic Topical Lidocaine 4% cream to wound bed prior to debridement 4. Dressing Applied: Aquacel Ag 5. Secondary Dressing Applied Guaze, ABD and kerlix/Conform 7. Secured with Tape Notes netting Electronic Signature(s) Signed: 01/29/2017 4:42:43 PM By: Curtis Sites Entered By: Curtis Sites on 01/29/2017 09:33:21 Abbs, [name] ([id number]) -------------------------------------------------------------------------------- Vitals Details Patient Name: [name] Date of Service: 01/29/2017 9:15 AM Medical Record Number: [id number] Patient Account Number: [id number] Date of Birth/Sex: [birth year] (83 y.o. Male) Treating RN: Curtis Sites Primary Care Calani Gick: [name] Mclean Other Clinician: Referring Elfrieda Espino: [name] Mclean Treating Adline Kirshenbaum/Extender: Rudene Re in [name]: 4 Vital Signs Time Taken: 09:13 Temperature (F): 97.7 Pulse (bpm): 81 Respiratory Rate (breaths/min): 16 Blood Pressure (mmHg): 121/49 Reference Range: 80 - 120 mg / dl Electronic Signature(s) Signed: 01/29/2017 4:42:43 PM By: Curtis Sites Entered By: Curtis Sites on 01/29/2017 09:13:14

## 2017-02-05 ENCOUNTER — Ambulatory Visit: Payer: Medicare HMO | Admitting: Surgery

## 2017-02-09 ENCOUNTER — Ambulatory Visit (INDEPENDENT_AMBULATORY_CARE_PROVIDER_SITE_OTHER): Payer: Medicare HMO | Admitting: Vascular Surgery

## 2017-02-09 ENCOUNTER — Encounter (INDEPENDENT_AMBULATORY_CARE_PROVIDER_SITE_OTHER): Payer: Self-pay

## 2017-02-09 ENCOUNTER — Ambulatory Visit (INDEPENDENT_AMBULATORY_CARE_PROVIDER_SITE_OTHER): Payer: Medicare HMO

## 2017-02-09 VITALS — BP 114/61 | HR 89 | Resp 14 | Ht 66.0 in | Wt 125.0 lb

## 2017-02-09 DIAGNOSIS — I10 Essential (primary) hypertension: Secondary | ICD-10-CM

## 2017-02-09 DIAGNOSIS — I7025 Atherosclerosis of native arteries of other extremities with ulceration: Secondary | ICD-10-CM | POA: Diagnosis not present

## 2017-02-09 DIAGNOSIS — L97913 Non-pressure chronic ulcer of unspecified part of right lower leg with necrosis of muscle: Secondary | ICD-10-CM | POA: Diagnosis not present

## 2017-02-09 DIAGNOSIS — J449 Chronic obstructive pulmonary disease, unspecified: Secondary | ICD-10-CM | POA: Diagnosis not present

## 2017-02-09 NOTE — Progress Notes (Signed)
MRN : [id number]  [name] Holston Sr. is a 83 y.o. (01/28/1934) male who presents with chief complaint of  Chief Complaint  Patient presents with  . Contact Lens Follow-up    No U/S done  .  History of Present Illness: The patient returns to the office for followup and review of the noninvasive studies. There has been a significant deterioration in the lower extremity symptoms.  The patient notes interval shortening of their claudication distance and development of mild rest pain symptoms. No new ulcers or wounds have occurred since the last visit.  There have been no significant changes to the patient's overall health care.  The patient denies amaurosis fugax or recent TIA symptoms. There are no recent neurological changes noted. The patient denies history of DVT, PE or superficial thrombophlebitis. The patient denies recent episodes of angina or shortness of breath.   ABI's Rt=monophasic and Lt=monophasic   No outpatient prescriptions have been marked as taking for the 02/09/17 encounter (Office Visit) with Gilda Crease, Latina Craver, MD.    Past Medical History:  Diagnosis Date  . Arthritis   . Gout   . Hypertension   . Stroke [facility])    left sided weakness    Past Surgical History:  Procedure Laterality Date  . INGUINAL HERNIA REPAIR Right 09/21/2015   Procedure: HERNIA REPAIR INGUINAL ADULT;  Surgeon: [name], MD;  Location: [facility] ORS;  Service: General;  Laterality: Right;  . LOWER EXTREMITY ANGIOGRAPHY Left 09/30/2016   Procedure: Lower Extremity Angiography;  Surgeon: [name], MD;  Location: [facility] INVASIVE CV LAB;  Service: Cardiovascular;  Laterality: Left;  . LOWER EXTREMITY ANGIOGRAPHY Right 01/13/2017   Procedure: Lower Extremity Angiography;  Surgeon: [name], MD;  Location: [facility] INVASIVE CV LAB;  Service: Cardiovascular;  Laterality: Right;    Social History Social History  Substance Use Topics  . Smoking status: Current Every Day Smoker   Packs/day: 1.50    Years: 50.00    Types: Cigarettes  . Smokeless tobacco: Never Used  . Alcohol use No     Comment: previous heavy drinker now 1x month    Family History Family History  Problem Relation Age of Onset  . Kidney disease Neg Hx   . Prostate cancer Neg Hx     Allergies  Allergen Reactions  . Hydrochlorothiazide     Documented on MAR  . Lisinopril     Documented on MAR     REVIEW OF SYSTEMS (Negative unless checked)  Constitutional: [] Weight loss  [] Fever  [] Chills Cardiac: [] Chest pain   [] Chest pressure   [] Palpitations   [] Shortness of breath when laying flat   [] Shortness of breath with exertion. Vascular:  [x] Pain in legs with walking   [x] Pain in legs at rest  [] History of DVT   [] Phlebitis   [x] Swelling in legs   [] Varicose veins   [x] Non-healing ulcers Pulmonary:   [] Uses home oxygen   [] Productive cough   [] Hemoptysis   [] Wheeze  [] COPD   [] Asthma Neurologic:  [] Dizziness   [] Seizures   [] History of stroke   [] History of TIA  [] Aphasia   [] Vissual changes   [] Weakness or numbness in arm   [] Weakness or numbness in leg Musculoskeletal:   [] Joint swelling   [] Joint pain   [] Low back pain Hematologic:  [] Easy bruising  [] Easy bleeding   [] Hypercoagulable state   [] Anemic Gastrointestinal:  [] Diarrhea   [] Vomiting  [] Gastroesophageal reflux/heartburn   [] Difficulty swallowing. Genitourinary:  [] Chronic kidney disease   []   Difficult urination  [] Frequent urination   [] Blood in urine Skin:  [] Rashes   [] Ulcers  Psychological:  [] History of anxiety   []  History of major depression.  Physical Examination  Vitals:   02/09/17 1350  BP: 114/61  Pulse: 89  Resp: 14  Weight: 125 lb (56.7 kg)  Height: 5\' 6"  (1.676 m)   Body mass index is 20.18 kg/m. Gen: WD/WN, NAD Head: /AT, No temporalis wasting.  Ear/Nose/Throat: Hearing grossly intact, nares w/o erythema or drainage Eyes: PER, EOMI, sclera nonicteric.  Neck: Supple, no large masses.   Pulmonary:   Good air movement, no audible wheezing bilaterally, no use of accessory muscles.  Cardiac: RRR, no JVD Vascular: ulceration of right foot with flexion contraction Vessel Right Left  Radial Palpable Palpable  PT Not Palpable Not Palpable  DP Not Palpable Not Palpable  Gastrointestinal: Non-distended. No guarding/no peritoneal signs.  Musculoskeletal: M/S 5/5 throughout.  No deformity or atrophy.  Neurologic: CN 2-12 intact. Symmetrical.  Speech is fluent. Motor exam as listed above. Psychiatric: Judgment intact, Mood & affect appropriate for pt's clinical situation. Dermatologic: No rashes or ulcers noted.  No changes consistent with cellulitis. Lymph : No lichenification or skin changes of chronic lymphedema.  CBC Lab Results  Component Value Date   WBC 4.4 12/02/2016   HGB 12.9 (L) 12/02/2016   HCT 37.8 (L) 12/02/2016   MCV 87.6 12/02/2016   PLT 425 12/02/2016    BMET    Component Value Date/Time   NA 134 (L) 12/04/2016 0427   NA 135 (L) 01/03/2014 1959   K 4.8 12/04/2016 0427   K 4.5 01/03/2014 1959   CL 101 12/04/2016 0427   CL 98 01/03/2014 1959   CO2 26 12/04/2016 0427   CO2 28 01/03/2014 1959   GLUCOSE 112 (H) 12/04/2016 0427   GLUCOSE 84 01/03/2014 1959   BUN 25 (H) 01/13/2017 1053   BUN 30 (H) 01/03/2014 1959   CREATININE 0.92 01/13/2017 1053   CREATININE 1.20 01/03/2014 1959   CALCIUM 9.5 12/04/2016 0427   CALCIUM 9.8 01/03/2014 1959   GFRNONAA >60 01/13/2017 1053   GFRNONAA 58 (L) 01/03/2014 1959   GFRAA >60 01/13/2017 1053   GFRAA >60 01/03/2014 1959   CrCl cannot be calculated (Patient's most recent lab result is older than the maximum 21 days allowed.).  COAG Lab Results  Component Value Date   INR 1.03 09/11/2015    Radiology No results found.   Assessment/Plan 1. Atherosclerosis of native arteries of the extremities with ulceration (HCC)  Recommend:  The patient has evidence of severe atherosclerotic changes of both lower extremities  associated with ulceration and tissue loss of the foot.  This represents a limb threatening ischemia and places the patient at the risk for limb loss.  Patient should undergo angiography of the lower extremities with the hope for intervention for limb salvage.  The risks and benefits as well as the alternative therapies was discussed in detail with the patient.  All questions were answered.  Patient does not agree to proceed with angiography.  He states he is tired and is not willing to go to the hospital I again stated that he will loose his leg if we don't improve his blood flow but he states he is OK with that.  The patient will follow up with me in the office in 3 months   - VAS [place] ABI WITH/WO TBI; Future  2. Chronic neurogenic ulcer of leg, right, with necrosis of muscle (HCC)  See #1  3. Chronic obstructive pulmonary disease, unspecified COPD type (HCC) Continue pulmonary medications and aerosols as already ordered, these medications have been reviewed and there are no changes at this time.    4. Essential hypertension Continue antihypertensive medications as already ordered, these medications have been reviewed and there are no changes at this time.     Levora DredgeGregory Obera Stauch, MD  02/09/2017 9:49 PM

## 2017-02-12 ENCOUNTER — Encounter: Payer: Medicare HMO | Attending: Physician Assistant | Admitting: Physician Assistant

## 2017-02-12 DIAGNOSIS — F17218 Nicotine dependence, cigarettes, with other nicotine-induced disorders: Secondary | ICD-10-CM | POA: Insufficient documentation

## 2017-02-12 DIAGNOSIS — L97311 Non-pressure chronic ulcer of right ankle limited to breakdown of skin: Secondary | ICD-10-CM | POA: Insufficient documentation

## 2017-02-12 DIAGNOSIS — I10 Essential (primary) hypertension: Secondary | ICD-10-CM | POA: Insufficient documentation

## 2017-02-12 DIAGNOSIS — I70235 Atherosclerosis of native arteries of right leg with ulceration of other part of foot: Secondary | ICD-10-CM | POA: Insufficient documentation

## 2017-02-12 DIAGNOSIS — L97513 Non-pressure chronic ulcer of other part of right foot with necrosis of muscle: Secondary | ICD-10-CM | POA: Diagnosis not present

## 2017-02-12 DIAGNOSIS — I70245 Atherosclerosis of native arteries of left leg with ulceration of other part of foot: Secondary | ICD-10-CM | POA: Insufficient documentation

## 2017-02-12 DIAGNOSIS — M109 Gout, unspecified: Secondary | ICD-10-CM | POA: Diagnosis not present

## 2017-02-12 DIAGNOSIS — M1990 Unspecified osteoarthritis, unspecified site: Secondary | ICD-10-CM | POA: Diagnosis not present

## 2017-02-14 NOTE — Progress Notes (Signed)
Bashor, Alda ([id number]) Visit Report for 02/12/2017 Chief Complaint Document Details Patient Name: [name], [name] Date of Service: 02/12/2017 1:30 PM Medical Record Number: [id number] Patient Account Number: [id number] Date of Birth/Sex: 10-30-1933 (83 y.o. Male) Treating RN: Montey Hora Primary Care Provider: Delight Stare Other Clinician: Referring Provider: Delight Stare Treating Provider/Extender: Melburn Hake, Brenlee Koskela Weeks in Treatment: 6 Information Obtained from: Patient Chief Complaint 12/30/16; patient arrives from [place] unaccompanied. He has 3 open wounds on the right foot and 2 threatened areas Electronic Signature(s) Signed: 02/13/2017 10:18:42 AM By: Worthy Keeler PA-C Entered By: Worthy Keeler on 02/12/2017 17:26:34 Krejci, [name] ([id number]) -------------------------------------------------------------------------------- Debridement Details Patient Name: [name] Date of Service: 02/12/2017 1:30 PM Medical Record Number: [id number] Patient Account Number: [id number] Date of Birth/Sex: Oct 02, 1933 (83 y.o. Male) Treating RN: Montey Hora Primary Care Provider: Delight Stare Other Clinician: Referring Provider: Delight Stare Treating Provider/Extender: Melburn Hake, Raylynn Hersh Weeks in Treatment: 6 Debridement Performed for Wound #2 Right Malleolus Assessment: Performed By: Physician STONE III, Beth Spackman E., PA-C Debridement: Open Wound/Selective Severity of Tissue Pre Fat layer exposed Debridement: Debridement Selective Description: Pre-procedure Verification/Time Out Yes - 14:06 Taken: Start Time: 14:06 Pain Control: Lidocaine 4% Topical Solution Level: Skin/Dermis Total Area Debrided (L x 1 (cm) x 1.2 (cm) = 1.2 (cm) W): Tissue and other Viable, Non-Viable, Callus, Exudate material debrided: Instrument: Forceps Bleeding: Minimum Hemostasis Achieved: Pressure End Time: 14:08 Procedural Pain: 0 Post Procedural Pain: 0 Response to Treatment:  Procedure was tolerated well Post Debridement Measurements of Total Wound Length: (cm) 1 Width: (cm) 1.2 Depth: (cm) 0.1 Volume: (cm) 0.094 Character of Wound/Ulcer Post Improved Debridement: Severity of Tissue Post Debridement: Fat layer exposed Post Procedure Diagnosis Same as Pre-procedure Electronic Signature(s) Stagliano, Gerald ([id number]) Signed: 02/12/2017 4:54:09 PM By: Montey Hora Signed: 02/13/2017 10:18:42 AM By: Worthy Keeler PA-C Entered By: Montey Hora on 02/12/2017 14:08:30 Mullett, [name] ([id number]) -------------------------------------------------------------------------------- Debridement Details Patient Name: [name] Date of Service: 02/12/2017 1:30 PM Medical Record Number: [id number] Patient Account Number: [id number] Date of Birth/Sex: May 22, 1934 (83 y.o. Male) Treating RN: Montey Hora Primary Care Provider: Delight Stare Other Clinician: Referring Provider: Delight Stare Treating Provider/Extender: STONE III, Felica Chargois Weeks in Treatment: 6 Debridement Performed for Wound #3 Right Toe Third Assessment: Performed By: Physician STONE III, Radiah Lubinski E., PA-C Debridement: Open Wound/Selective Severity of Tissue Pre Fat layer exposed Debridement: Debridement Selective Description: Pre-procedure Verification/Time Out Yes - 14:08 Taken: Start Time: 14:08 Pain Control: Lidocaine 4% Topical Solution Level: Skin/Dermis Total Area Debrided (L x 0.7 (cm) x 0.8 (cm) = 0.56 (cm) W): Tissue and other Viable, Non-Viable, Callus, Exudate material debrided: Instrument: Forceps Bleeding: Minimum Hemostasis Achieved: Pressure End Time: 14:10 Procedural Pain: 0 Post Procedural Pain: 0 Response to Treatment: Procedure was tolerated well Post Debridement Measurements of Total Wound Length: (cm) 0.7 Width: (cm) 0.8 Depth: (cm) 0.1 Volume: (cm) 0.044 Character of Wound/Ulcer Post Improved Debridement: Severity of Tissue Post Debridement: Fat layer  exposed Post Procedure Diagnosis Same as Pre-procedure Electronic Signature(s) Disbro, Prescott ([id number]) Signed: 02/12/2017 4:54:09 PM By: Montey Hora Signed: 02/13/2017 10:18:42 AM By: Worthy Keeler PA-C Entered By: Montey Hora on 02/12/2017 14:10:42 Talent, [name] ([id number]) -------------------------------------------------------------------------------- HPI Details Patient Name: [name] Date of Service: 02/12/2017 1:30 PM Medical Record Number: [id number] Patient Account Number: [id number] Date of Birth/Sex: [birth year] (84 y.o. Male) Treating RN: Montey Hora Primary Care Provider: Delight Stare Other Clinician: Referring Provider: Delight Stare Treating Provider/Extender: STONE III, Charnetta Wulff Weeks in Treatment: 6 History of Present Illness HPI  Description: 12/30/16; this is an 83 year old man who comes from [place] skilled facility. He comes today unaccompanied and without any history. Looking through [facility] link I am able to see that he was admitted to hospital from 5/25 through 12/04/16 at that point with dehydration, severe hyponatremia acute kidney injury. He was treated with IV fluid replacement including 3% normal saline. Nowhere in his discharge summary did I see any reference to wounds on his feet and is mentioned he does not come in with any history. He does come in with a pack of cigarettes therefore I'm assuming he is a smoker. He is not a listed diabetic. There is some reference in his notes that he has a DSS Education officer, museum is his guardian I'm not completely sure if that is true in any case the patient has some degree of dementia although he is able to tell me that he is 83 years old came from [place]. He is not able to give a history of these wounds. Our intake nurse notes maggots in the right third toe. Beyond this he has an area over the right lateral malleolus on the right lateral foot. He also has to threatened areas on the base of the  right fifth metatarsal and on the lateral aspect of the right fifth metatarsal head. We are completely unable to do ABIs in the clinic largely because when we inflated the cuff he seemed to withdraw the foot. 01/05/17; the patient was seen urgently by Dr. Delana Meyer of vein and vascular. He agreed with our assessment of critical limb ischemia and set him up for an angiogram. Unfortunately the patient was not transported for preoperative evaluation therefore the angiogram has been canceled. I am not exactly sure if and when this is going to be rescheduled. The patient's wounds look about the same, right lateral ankle right lateral foot third toe. There is also concerning areas over the fifth met head and base of the fifth metatarsal which are not open but are covered by a dark eschar logorrhea and painful. We have been using silver alginate and x-ray of the right foot done at the facility showed no definite radiographic evidence of acute fracture or dislocation there was an old avulsion fracture of the proximal phalanx fifth digit as well as old fractures of the base of the proximal phalanx of the fifth digit. There is no comment about osteomyelitis 01/16/17 I have patient's record from his operative report from [facility] with Dr. Delana Meyer were fortunately patient underwent a successful angioplasty in regard to his right lower extremity. The anterior tibial in-line flow was improved with less than 5% residual stenosis. Angioplasty of the SFA and popliteal arteries also yielded excellent results in post procedure testing with less than 10% residual stenosis. Overall patient appears to have done very well with this procedure. Hopefully this will translate into better wound healing in regard to his right lower extremity wounds. 01/22/2017 -- the patient comes with no dressings appropriate on his right lower extremity and the patient is rather non-verbal and coherent. He has no nursing or  caregiver 02/12/17 on evaluation today patient appears to be doing a little better in regard to his wounds and the Garcia, OTHAL ([id number]) dressings were in place at all for wound locations as they should be. With that being said he has some discomfort noted with cleansing of the wounds. No fevers, chills, nausea, or vomiting noted at this time. Electronic Signature(s) Signed: 02/13/2017 10:18:42 AM By: Melburn Hake,  Margarita Grizzle PA-C Entered By: Worthy Keeler on 02/12/2017 17:27:48 Landgrebe, [name] ([id number]) -------------------------------------------------------------------------------- Physical Exam Details Patient Name: PRIDEMachael, Raine Date of Service: 02/12/2017 1:30 PM Medical Record Number: [id number] Patient Account Number: [id number] Date of Birth/Sex: Sep 08, 1933 (83 y.o. Male) Treating RN: Montey Hora Primary Care Provider: Delight Stare Other Clinician: Referring Provider: Delight Stare Treating Provider/Extender: STONE III, Weiland Tomich Weeks in Treatment: 6 Constitutional Well-nourished and well-hydrated in no acute distress. Respiratory normal breathing without difficulty. clear to auscultation bilaterally. Cardiovascular regular rate and rhythm with normal S1, S2. Psychiatric this patient is able to make decisions and demonstrates good insight into disease process. Patient is oriented to person only. patient is confused. Notes Patient's wounds appeared to have a fairly healthy granular surface noted some callous surrounding some of the wound. However his ABIs were poor and actually in audible in regard to pulses during evaluation therefore no debridement was attempted today. Only a selective debridement of non-viable tissue was performed to remove dead tissue. Electronic Signature(s) Signed: 02/13/2017 10:18:42 AM By: Worthy Keeler PA-C Entered By: Worthy Keeler on 02/12/2017 17:30:37 Vorce, [name]  ([id number]) -------------------------------------------------------------------------------- Physician Orders Details Patient Name: [name] Date of Service: 02/12/2017 1:30 PM Medical Record Number: [id number] Patient Account Number: [id number] Date of Birth/Sex: 1933/12/18 (83 y.o. 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 o May Shower, gently pat wound dry prior to applying new dressing. Wound #2 Right Malleolus o Clean wound with Normal Saline. o May Shower, gently pat wound dry prior to applying new dressing. Wound #3 Right Toe Third o Clean wound with Normal Saline. o May Shower, gently pat wound dry prior to applying new dressing. Wound #4 Right,Plantar Metatarsal head fifth o Clean wound with Normal Saline. o May Shower, gently pat wound dry prior to applying new dressing. Anesthetic Wound #1 Right,Lateral Ankle o Topical Lidocaine 4% cream applied to wound bed prior to debridement Wound #2 Right Malleolus o Topical Lidocaine 4% cream applied to wound bed prior to debridement Wound #3 Right Toe Third o Topical Lidocaine 4% cream applied to  wound bed prior to debridement Robbins, [name] ([id number]) Wound #4 Right,Plantar Metatarsal head fifth o Topical Lidocaine 4% cream applied to wound bed prior to debridement Primary Wound Dressing Wound #1 Right,Lateral Ankle o Aquacel Ag Wound #2 Right Malleolus o Aquacel Ag Wound #3 Right Toe Third o Aquacel Ag Wound #4 Right,Plantar Metatarsal head fifth o Aquacel Ag Secondary Dressing Wound #1 Right,Lateral Ankle o Gauze and Kerlix/Conform Wound #2 Right Malleolus o Gauze and Kerlix/Conform Wound #3 Right Toe Third o Gauze and Kerlix/Conform Wound #4 Right,Plantar Metatarsal head fifth o Gauze and Kerlix/Conform Dressing Change Frequency Wound #1 Right,Lateral Ankle o Change dressing every day. Wound #2 Right Malleolus o Change dressing every day. Wound #3 Right Toe Third o Change dressing every  day. Wound #4 Right,Plantar Metatarsal head fifth o Change dressing every day. Follow-up Appointments Wound #1 Right,Lateral Ankle o Return Appointment in 1 week. Wound #2 Right Malleolus Seifert, [name] ([id number]) o Return Appointment in 1 week. Wound #3 Right Toe Third o Return Appointment in 1 week. Wound #4 Right,Plantar Metatarsal head fifth o Return Appointment in 1 week. Additional Orders / Instructions Wound #1 Right,Lateral Ankle o Stop Smoking o Increase protein intake. Wound #2 Right Malleolus o Stop Smoking o Increase protein intake. Wound #3 Right Toe Third o Stop Smoking o Increase protein intake. Wound #4 Right,Plantar Metatarsal head fifth o Stop Smoking o Increase protein intake. Notes We will continue with the Current wound care measures per above for the next week. If anything worsens in the interim patient will contact our office or rather have the facility do so on his back for additional recommendations. Otherwise we will see him for reevaluation at that point. Electronic Signature(s) Signed:  02/13/2017 10:18:42 AM By: Worthy Keeler PA-C Previous Signature: 02/12/2017 4:54:09 PM Version By: Montey Hora Entered By: Worthy Keeler on 02/12/2017 17:29:12 Mongillo, [name] ([id number]) -------------------------------------------------------------------------------- Problem List Details Patient Name: [name] Date of Service: 02/12/2017 1:30 PM Medical Record Number: [id number] Patient Account Number: [id number] Date of Birth/Sex: 12/09/1933 (83 y.o. 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-pressure chronic ulcer of other part of right foot with 12/30/2016 Yes necrosis of muscle L97.513 Non-pressure chronic ulcer of other part of right foot with 12/30/2016 Yes necrosis of muscle I70.235 Atherosclerosis of native arteries of right leg with 12/30/2016 Yes ulceration of other part of foot I70.245 Atherosclerosis of native arteries of left leg with ulceration 12/30/2016 Yes of other part of foot F17.218 Nicotine dependence, cigarettes, with other nicotine- 01/22/2017 Yes induced disorders Inactive Problems Resolved Problems Electronic Signature(s) Signed: 02/13/2017 10:18:42 AM By: Worthy Keeler PA-C Entered By: Worthy Keeler on 02/12/2017 13:47:05 Bosso, [name] ([id number]) -------------------------------------------------------------------------------- Progress Note Details Patient Name: [name] Date of Service: 02/12/2017 1:30 PM Medical Record Number: [id number] Patient Account Number: [id number] Date of Birth/Sex: 04-26-1934 (83 y.o. Male) Treating RN: Montey Hora Primary Care Provider: Delight Stare Other Clinician: Referring Provider:  Delight Stare Treating Provider/Extender: Melburn Hake, Lowell Mcgurk Weeks in Treatment: 6 Subjective Chief Complaint Information obtained from Patient 12/30/16; patient arrives from [place] unaccompanied. He has 3 open wounds on the right foot and 2 threatened areas History of Present Illness (HPI) 12/30/16; this is an 83 year old man who comes from [place] skilled facility. He comes today unaccompanied and without any history. Looking through [facility] link I am able to see that he was admitted to hospital from 5/25 through 12/04/16 at that point with dehydration, severe hyponatremia acute kidney injury. He was treated with IV fluid replacement including 3% normal saline. Nowhere in his discharge summary did I see any reference to wounds on his feet and is mentioned he does not come in with any history. He does come in with a pack of cigarettes therefore I'm assuming he is a smoker. He is not a listed diabetic. There is some reference in his notes that he has a DSS Education officer, museum is his guardian I'm not completely sure if that is true in any case the patient has some  degree of dementia although he is able to tell me that he is 83 years old came from [facility]. He is not able to give a history of these wounds. Our intake nurse notes maggots in the right third toe. Beyond this he has an area over the right lateral malleolus on the right lateral foot. He also has to threatened areas on the base of the right fifth metatarsal and on the lateral aspect of the right fifth metatarsal head. We are completely unable to do ABIs in the clinic largely because when we inflated the cuff he seemed to withdraw the foot. 01/05/17; the patient was seen urgently by Dr. Gilda Crease of vein and vascular. He agreed with our assessment of critical limb ischemia and set him up for an angiogram. Unfortunately the patient was not transported for preoperative evaluation therefore the angiogram has been  canceled. I am not exactly sure if and when this is going to be rescheduled. The patient's wounds look about the same, right lateral ankle right lateral foot third toe. There is also concerning areas over the fifth met head and base of the fifth metatarsal which are not open but are covered by a dark eschar logorrhea and painful. We have been using silver alginate and x-ray of the right foot done at the facility showed no definite radiographic evidence of acute fracture or dislocation there was an old avulsion fracture of the proximal phalanx fifth digit as well as old fractures of the base of the proximal phalanx of the fifth digit. There is no comment about osteomyelitis 01/16/17 I have patient's record from his operative report from [facility] with Dr. Gilda Crease were fortunately patient underwent a successful angioplasty in regard to his right lower extremity. The anterior tibial in-line flow was improved with less than 5% residual stenosis. Angioplasty of the SFA and popliteal arteries also yielded excellent results in post procedure testing with less than 10% residual Barhorst, [name] ([id number]) stenosis. Overall patient appears to have done very well with this procedure. Hopefully this will translate into better wound healing in regard to his right lower extremity wounds. 01/22/2017 -- the patient comes with no dressings appropriate on his right lower extremity and the patient is rather non-verbal and coherent. He has no nursing or caregiver 02/12/17 on evaluation today patient appears to be doing a little better in regard to his wounds and the dressings were in place at all for wound locations as they should be. With that being said he has some discomfort noted with cleansing of the wounds. No fevers, chills, nausea, or vomiting noted at this time. Objective Constitutional Well-nourished and well-hydrated in no acute distress. Vitals Time Taken: 1:33 PM, Temperature: 97.9 F,  Pulse: 83 bpm, Respiratory Rate: 16 breaths/min, Blood Pressure: 114/65 mmHg. Respiratory normal breathing without difficulty. clear to auscultation bilaterally. Cardiovascular regular rate and rhythm with normal S1, S2. Psychiatric this patient is able to make decisions and demonstrates good insight into disease process. Patient is oriented to person only. patient is confused. General Notes: Patient's wounds appeared to have a fairly healthy granular surface noted some callous surrounding some of the wound. However his ABIs were poor and actually in audible in regard to pulses during evaluation therefore no debridement was attempted today. Only a selective debridement of non- viable tissue was performed to remove dead tissue. Integumentary (Hair, Skin) Wound #1 status is Open. Original cause of wound was Gradually Appeared. The wound is located on the Right,Lateral Ankle. The wound measures 1.2cm  length x 1.6cm width x 0.1cm depth; 1.508cm^2 area and 0.151cm^3 volume. There is Fat Layer (Subcutaneous Tissue) Exposed exposed. There is no tunneling or undermining noted. There is a large amount of serous drainage noted. The wound margin is flat and intact. There is large (67-100%) red granulation within the wound bed. There is a small (1-33%) amount of necrotic tissue within the wound bed including Adherent Slough. The periwound skin appearance did not exhibit: Callus, Crepitus, Excoriation, Induration, Rash, Scarring, Dry/Scaly, Maceration, Atrophie Blanche, Mccarron, Darwin ([id number]) Cyanosis, Ecchymosis, Hemosiderin Staining, Mottled, Pallor, Rubor, Erythema. Periwound temperature was noted as No Abnormality. The periwound has tenderness on palpation. Wound #2 status is Open. Original cause of wound was Gradually Appeared. The wound is located on the Right Malleolus. The wound measures 1cm length x 1.2cm width x 0.1cm depth; 0.942cm^2 area and 0.094cm^3 volume. There is Fat Layer  (Subcutaneous Tissue) Exposed exposed. There is no tunneling or undermining noted. There is a large amount of serous drainage noted. The wound margin is flat and intact. There is large (67-100%) red granulation within the wound bed. There is a small (1-33%) amount of necrotic tissue within the wound bed including Adherent Slough. The periwound skin appearance did not exhibit: Callus, Crepitus, Excoriation, Induration, Rash, Scarring, Dry/Scaly, Maceration, Atrophie Blanche, Cyanosis, Ecchymosis, Hemosiderin Staining, Mottled, Pallor, Rubor, Erythema. Periwound temperature was noted as No Abnormality. The periwound has tenderness on palpation. Wound #3 status is Open. Original cause of wound was Gradually Appeared. The wound is located on the Right Toe Third. The wound measures 0.7cm length x 0.8cm width x 0.1cm depth; 0.44cm^2 area and 0.044cm^3 volume. There is no tunneling or undermining noted. There is a large amount of purulent drainage noted. The wound margin is flat and intact. There is no granulation within the wound bed. There is a large (67-100%) amount of necrotic tissue within the wound bed including Eschar. The periwound skin appearance did not exhibit: Callus, Crepitus, Excoriation, Induration, Rash, Scarring, Dry/Scaly, Maceration, Atrophie Blanche, Cyanosis, Ecchymosis, Hemosiderin Staining, Mottled, Pallor, Rubor, Erythema. Periwound temperature was noted as No Abnormality. The periwound has tenderness on palpation. Wound #4 status is Open. Original cause of wound was Gradually Appeared. The wound is located on the Right,Plantar Metatarsal head fifth. The wound measures 0.1cm length x 0.1cm width x 0.1cm depth; 0.008cm^2 area and 0.001cm^3 volume. 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 There was a Skin/Dermis Open Wound/Selective [phone]) debridement with total area of 1.2 sq cm performed by STONE III, Beretta Ginsberg E., PA-C. with the following instrument(s): Forceps to remove Viable and Non-Viable tissue/material including Exudate and Callus after achieving pain control using Lidocaine 4% Topical Solution. A time out was conducted at 14:06, prior to the start of the procedure. A Minimum amount of bleeding was controlled with Pressure. The procedure was tolerated well with a pain level of 0  throughout and a pain level of 0 following the procedure. Post Debridement Measurements: 1cm length x 1.2cm width x 0.1cm depth; 0.094cm^3 volume. Character of Wound/Ulcer Post Debridement is improved. Severity of Tissue Post Debridement is: Fat layer exposed. 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 There was a Skin/Dermis Open Wound/Selective [phone]) debridement with total area of 0.56 sq cm performed by STONE III, Stafford Riviera E., PA-C. with the following instrument(s): Forceps to remove Viable and Non-Viable tissue/material including Exudate and Callus after achieving pain control using Lidocaine 4% Topical Solution. A time out was conducted at 14:08, prior to the start of the  procedure. A Minimum amount of bleeding was controlled with Pressure. The procedure was tolerated well with a pain level of 0 throughout and a pain level of 0 following the procedure. Post Debridement Measurements: 0.7cm length x 0.8cm width x 0.1cm depth; 0.044cm^3 volume. Character of Wound/Ulcer Post Debridement is improved. Severity of Tissue Post Debridement is: Fat layer exposed. Post procedure Diagnosis Wound #3: Same as Pre-Procedure Plan Wound Cleansing: Wound #1 Right,Lateral Ankle: Clean wound with Normal Saline. May Shower, gently pat wound dry prior to applying new dressing. Wound #2 Right Malleolus: Clean wound with Normal Saline. May Shower, gently pat wound dry prior to applying new dressing. Wound #3 Right Toe Third: Clean wound with Normal Saline. May Shower, gently pat wound dry prior to applying new dressing. Wound #4 Right,Plantar Metatarsal head fifth: Clean wound with Normal Saline. May Shower, gently pat wound dry prior to applying new dressing. Anesthetic: Wound #1 Right,Lateral Ankle: Topical Lidocaine 4% cream applied to wound bed prior to debridement Kindel, [name] ([id number]) Wound #2 Right Malleolus: Topical Lidocaine 4% cream applied to wound bed prior to debridement Wound #3 Right Toe Third: Topical Lidocaine 4% cream applied to wound bed prior to debridement Wound #4 Right,Plantar Metatarsal head fifth: Topical Lidocaine 4% cream applied to wound bed prior to debridement Primary Wound Dressing: Wound #1 Right,Lateral Ankle: Aquacel Ag Wound #2 Right Malleolus: Aquacel Ag Wound #3 Right Toe Third: Aquacel Ag Wound #4 Right,Plantar Metatarsal head fifth: Aquacel Ag Secondary Dressing: Wound #1 Right,Lateral Ankle: Gauze and Kerlix/Conform Wound #2 Right Malleolus: Gauze and Kerlix/Conform Wound #3 Right Toe Third: Gauze and Kerlix/Conform Wound #4 Right,Plantar Metatarsal head fifth: Gauze and Kerlix/Conform Dressing Change  Frequency: Wound #1 Right,Lateral Ankle: Change dressing every day. Wound #2 Right Malleolus: Change dressing every day. Wound #3 Right Toe Third: Change dressing every day. Wound #4 Right,Plantar Metatarsal head fifth: Change dressing every day. Follow-up Appointments: Wound #1 Right,Lateral Ankle: Return Appointment in 1 week. Wound #2 Right Malleolus: Return Appointment in 1 week. Wound #3 Right Toe Third: Return Appointment in 1 week. Wound #4 Right,Plantar Metatarsal head fifth: Return Appointment in 1 week. Additional Orders / Instructions: Wound #1 Right,Lateral Ankle: Stop Smoking Increase protein intake. Wound #2 Right Malleolus: Stop Smoking Increase protein intake. Wound #3 Right Toe Third: Stop Smoking Temkin, [name] ([id number]) Increase protein intake. Wound #4 Right,Plantar Metatarsal head fifth: Stop Smoking Increase protein intake. General Notes: We will continue with the Current wound care measures per above for the next week. If anything worsens in the interim patient will contact our office or rather have the facility do so on his back for additional recommendations. Otherwise we will see him for reevaluation at that point. Electronic Signature(s) Signed: 02/13/2017 10:18:42  AM By: Worthy Keeler PA-C Entered By: Worthy Keeler on 02/12/2017 17:33:27 Werden, [name] ([id number]) -------------------------------------------------------------------------------- SuperBill Details Patient Name: Larsson, [name] Date of Service: 02/12/2017 Medical Record Number: [id number] Patient Account Number: [id number] Date of Birth/Sex: 27-Jul-1933 (83 y.o. 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 ulcer of other part of right foot with necrosis of muscle 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 F17.218 Nicotine dependence, cigarettes, with other nicotine-induced disorders Facility Procedures CPT4 Code Description: [id number] - DEBRIDE WOUND 1ST 20 SQ CM OR < ICD-10 Description Diagnosis L97.311 Non-pressure chronic ulcer of right ankle limited to br Modifier: eakdown of skin Quantity: 1 Physician Procedures CPT4 Code Description: [id number] - WC PHYS DEBR WO ANESTH 20 SQ CM ICD-10 Description Diagnosis L97.311 Non-pressure chronic ulcer of right ankle limited to bre Modifier: akdown of skin Quantity: 1 Electronic Signature(s) Signed: 02/13/2017 10:18:42 AM By: Worthy Keeler PA-C Entered By: Worthy Keeler on 02/12/2017 17:33:34

## 2017-02-16 NOTE — Progress Notes (Signed)
[name] ([id number]) Visit Report for 02/12/2017 Arrival Information Details Patient Name: [name], [name] Date of Service: 02/12/2017 1:30 PM Medical Record Number: [id number] Patient Account Number: [id number] Date of Birth/Sex: Aug 02, 1933 [age] y.o. Male) Treating RN: Curtis Sitesorthy, Joanna Primary Care Genoveva Singleton: Darreld McleanMILES, LINDA Other Clinician: Referring Lamontae Ricardo: Darreld McleanMILES, LINDA Treating Merville Hijazi/Extender: Linwood DibblesSTONE III, HOYT Weeks in Treatment: 6 Visit Information History Since Last Visit Added or deleted any medications: No Patient Arrived: Wheel Chair Any new allergies or adverse reactions: No Arrival Time: 13:32 Had a fall or experienced change in No activities of daily living that may affect Accompanied By: self risk of falls: Transfer Assistance: Manual Signs or symptoms of abuse/neglect since last No Patient Identification Verified: Yes visito Secondary Verification Process Yes Hospitalized since last visit: No Completed: Has Dressing in Place as Prescribed: Yes Pain Present Now: No Electronic Signature(s) Signed: 02/12/2017 4:54:09 PM By: Curtis Sitesorthy, Joanna Entered By: Curtis Sitesorthy, Joanna on 02/12/2017 13:32:58 Revuelta, [name] ([id number]) -------------------------------------------------------------------------------- Encounter Discharge Information Details Patient Name: [name] Date of Service: 02/12/2017 1:30 PM Medical Record Number: [id number] Patient Account Number: [id number] Date of Birth/Sex: Aug 02, 1933 [age] y.o. Male) Treating RN: Curtis Sitesorthy, Joanna Primary Care Kristel Durkee: Darreld McleanMILES, LINDA Other Clinician: Referring Marshal Eskew: Darreld McleanMILES, LINDA Treating Shimon Trowbridge/Extender: Linwood DibblesSTONE III, HOYT Weeks in Treatment: 6 Encounter Discharge Information Items Discharge Pain Level: 0 Discharge Condition: Stable Ambulatory Status: Wheelchair Discharge Destination: Nursing Home Transportation: Private Auto Accompanied By: self Schedule Follow-up Appointment: Yes Medication Reconciliation  completed No and provided to Patient/Care Donaldo Teegarden: Provided on Clinical Summary of Care: 02/12/2017 Form Type Recipient Paper Patient LP Electronic Signature(s) Signed: 02/13/2017 7:52:27 AM By: Curtis Sitesorthy, Joanna Previous Signature: 02/12/2017 2:26:12 PM Version By: Gwenlyn PerkingMoore, Shelia Entered By: Curtis Sitesorthy, Joanna on 02/13/2017 07:52:27 [name] ([id number]) -------------------------------------------------------------------------------- Lower Extremity Assessment Details Patient Name: [name], [name] Date of Service: 02/12/2017 1:30 PM Medical Record Number: [id number] Patient Account Number: [id number] Date of Birth/Sex: Aug 02, 1933 [age] y.o. Male) Treating RN: Curtis Sitesorthy, Joanna Primary Care Meilyn Heindl: Darreld McleanMILES, LINDA Other Clinician: Referring Dyami Umbach: Darreld McleanMILES, LINDA Treating Shameka Aggarwal/Extender: STONE III, HOYT Weeks in Treatment: 6 Vascular Assessment Pulses: Dorsalis Pedis Palpable: [Right:No] Doppler Audible: [Right:Inaudible] Posterior Tibial Palpable: [Right:No] Doppler Audible: [Right:Inaudible] Extremity colors, hair growth, and conditions: Extremity Color: [Right:Normal] Hair Growth on Extremity: [Right:No] Temperature of Extremity: [Right:Warm] Capillary Refill: [Right:< 3 seconds] Electronic Signature(s) Signed: 02/12/2017 4:54:09 PM By: Curtis Sitesorthy, Joanna Entered By: Curtis Sitesorthy, Joanna on 02/12/2017 14:05:08 [name] ([id number]) -------------------------------------------------------------------------------- Multi Wound Chart Details Patient Name: [name] Date of Service: 02/12/2017 1:30 PM Medical Record Number: [id number] Patient Account Number: [id number] Date of Birth/Sex: Aug 02, 1933 [age] y.o. Male) Treating RN: Curtis Sitesorthy, Joanna Primary Care Jimel Myler: Darreld McleanMILES, LINDA Other Clinician: Referring Jerett Odonohue: Darreld McleanMILES, LINDA Treating Demaya Hardge/Extender: STONE III, HOYT Weeks in Treatment: 6 Vital Signs Height(in): Pulse(bpm): 83 Weight(lbs): Blood Pressure 114/65 (mmHg): Body Mass  Index(BMI): Temperature(F): 97.9 Respiratory Rate 16 (breaths/min): Photos: [1:No Photos] [2:No Photos] [3:No Photos] Wound Location: [1:Right Ankle - Lateral] [2:Right Malleolus] [3:Right Toe Third] Wounding Event: [1:Gradually Appeared] [2:Gradually Appeared] [3:Gradually Appeared] 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] Comorbid History: [1:Hypertension, Peripheral Hypertension, Peripheral Hypertension, Peripheral Venous Disease, Gout, Osteoarthritis] [2:Venous Disease, Gout, Osteoarthritis] [3:Venous Disease, Gout, Osteoarthritis] Date Acquired: [1:12/04/2016] [2:12/04/2016] [3:12/04/2016] Weeks of Treatment: [1:6] [2:6] [3:6] Wound Status: [1:Open] [2:Open] [3:Open] Pending Amputation on Yes [2:Yes] [3:Yes] Presentation: Measurements L x W x D 1.2x1.6x0.1 [2:1x1.2x0.1] [3:0.7x0.8x0.1] (cm) Area (cm) : [1:1.508] [2:0.942] [3:0.44] Volume (cm) : [1:0.151] [2:0.094] [3:0.044] % Reduction in Area: [1:73.60%] [2:81.50%] [3:87.80%] % Reduction in Volume: 91.20% [2:90.80%] [3:93.90%] Classification: [1:Full  Thickness With Exposed Support Structures] [2:Full Thickness With Exposed Support Structures] [3:Full Thickness Without Exposed Support Structures] Exudate Amount: [1:Large] [2:Large] [3:Large] Exudate Type: [1:Serous] [2:Serous] [3:Purulent] Exudate Color: [1:amber] [2:amber] [3:yellow, brown, green] Foul Odor After [1:No] [2:No] [3:Yes] Cleansing: Odor Anticipated Due to N/A [2:N/A] [3:No] Product Use: Wound Margin: [1:Flat and Intact] [2:Flat and Intact] [3:Flat and Intact] Granulation Amount: [1:Large (67-100%)] [2:Large (67-100%)] [3:None Present (0%)] Granulation Quality: Red Red N/A Necrotic Amount: Small (1-33%) Small (1-33%) Large (67-100%) Necrotic Tissue: Adherent Slough Adherent Slough Eschar Exposed Structures: Fat Layer (Subcutaneous Fat Layer (Subcutaneous Fascia: No Tissue) Exposed:  Yes Tissue) Exposed: Yes Fat Layer (Subcutaneous Fascia: No Fascia: No Tissue) Exposed: No Tendon: No Tendon: No Tendon: No Muscle: No Muscle: No Muscle: No Joint: No Joint: No Joint: No Bone: No Bone: No Bone: No Epithelialization: None None None Periwound Skin Texture: Excoriation: No Excoriation: No Excoriation: No Induration: No Induration: No Induration: No Callus: No Callus: No Callus: No Crepitus: No Crepitus: No Crepitus: No Rash: No Rash: No Rash: No Scarring: No Scarring: No Scarring: No Periwound Skin Maceration: No Maceration: No Maceration: No Moisture: Dry/Scaly: No Dry/Scaly: No Dry/Scaly: No Periwound Skin Color: Atrophie Blanche: No Atrophie Blanche: No Atrophie Blanche: No Cyanosis: No Cyanosis: No Cyanosis: No Ecchymosis: No Ecchymosis: No Ecchymosis: No Erythema: No Erythema: No Erythema: No Hemosiderin Staining: No Hemosiderin Staining: No Hemosiderin Staining: No Mottled: No Mottled: No Mottled: No Pallor: No Pallor: No Pallor: No Rubor: No Rubor: No Rubor: No Temperature: No Abnormality No Abnormality No Abnormality Tenderness on Yes Yes Yes Palpation: Wound Preparation: Ulcer Cleansing: Ulcer Cleansing: Ulcer Cleansing: Rinsed/Irrigated with Rinsed/Irrigated with Rinsed/Irrigated with Saline Saline Saline Topical Anesthetic Topical Anesthetic Topical Anesthetic Applied: Other: lidocaine Applied: Other: lidocaine Applied: Other: lidocaine 4% 4% 4% Wound Number: 4 N/A N/A Photos: No Photos N/A N/A Wound Location: Right, Plantar Metatarsal N/A N/A head fifth Wounding Event: Gradually Appeared N/A N/A Primary Etiology: Arterial Insufficiency Ulcer N/A N/A Comorbid History: N/A N/A N/A Date Acquired: 01/16/2017 N/A N/A Weeks of Treatment: 3 N/A N/A Wound Status: Open N/A N/A Pending Amputation on No N/A N/A Presentation: Underdown, [name] ([id number]) Measurements L x W x D 0.1x0.1x0.1 N/A N/A (cm) Area (cm) :  0.008 N/A N/A Volume (cm) : 0.001 N/A N/A % Reduction in Area: 99.40% N/A N/A % Reduction in Volume: 99.20% N/A N/A Classification: Full Thickness Without N/A N/A Exposed Support Structures Exudate Amount: N/A N/A N/A Exudate Type: N/A N/A N/A Exudate Color: N/A N/A N/A Foul Odor After N/A N/A N/A Cleansing: Odor Anticipated Due to N/A N/A N/A Product Use: Wound Margin: N/A N/A N/A Granulation Amount: N/A N/A N/A Granulation Quality: N/A N/A N/A Necrotic Amount: N/A N/A N/A Necrotic Tissue: N/A N/A N/A Exposed Structures: N/A N/A N/A Epithelialization: N/A N/A N/A Periwound Skin Texture: No Abnormalities Noted N/A N/A Periwound Skin No Abnormalities Noted N/A N/A Moisture: Periwound Skin Color: No Abnormalities Noted N/A N/A Temperature: N/A N/A N/A Tenderness on No N/A N/A Palpation: Wound Preparation: N/A N/A N/A Treatment Notes Electronic Signature(s) Signed: 02/12/2017 4:54:09 PM By: Curtis Sites Entered By: Curtis Sites on 02/12/2017 14:05:39 [name], [name] ([id number]) -------------------------------------------------------------------------------- Multi-Disciplinary Care Plan Details Patient Name: [name] Date of Service: 02/12/2017 1:30 PM Medical Record Number: [id number] Patient Account Number: [id number] Date of Birth/Sex: [birth year] (83 y.o. Male) Treating RN: Curtis Sites Primary Care Shakala Marlatt: Darreld Mclean Other Clinician: Referring Krish Bailly: Darreld Mclean Treating Hildegard Hlavac/Extender: STONE III, HOYT Weeks in Treatment: 6 Active Inactive ` Abuse / Safety / Falls /  Self Care Management Nursing Diagnoses: Potential for falls Goals: Patient will not experience any injury related to falls Date Initiated: 12/30/2016 Target Resolution Date: 02/27/2017 Goal Status: Active Interventions: Assess fall risk on admission and as needed Notes: ` Orientation to the Wound Care Program Nursing Diagnoses: Knowledge deficit related to the wound healing  center program Goals: Patient/caregiver will verbalize understanding of the Wound Healing Center Program Date Initiated: 12/30/2016 Target Resolution Date: 02/27/2017 Goal Status: Active Interventions: Provide education on orientation to the wound center Notes: ` Wound/Skin Impairment Nursing Diagnoses: Impaired tissue integrity Coyne, [name] ([id number]) Goals: Ulcer/skin breakdown will have a volume reduction of 30% by week 4 Date Initiated: 12/30/2016 Target Resolution Date: 02/27/2017 Goal Status: Active Ulcer/skin breakdown will have a volume reduction of 50% by week 8 Date Initiated: 12/30/2016 Target Resolution Date: 02/27/2017 Goal Status: Active Ulcer/skin breakdown will have a volume reduction of 80% by week 12 Date Initiated: 12/30/2016 Target Resolution Date: 02/27/2017 Goal Status: Active Ulcer/skin breakdown will heal within 14 weeks Date Initiated: 12/30/2016 Target Resolution Date: 02/27/2017 Goal Status: Active Interventions: Assess patient/caregiver ability to obtain necessary supplies Assess patient/caregiver ability to perform ulcer/skin care regimen upon admission and as needed Assess ulceration(s) every visit Notes: Electronic Signature(s) Signed: 02/12/2017 4:54:09 PM By: Curtis Sites Entered By: Curtis Sites on 02/12/2017 14:05:27 Arrants, [name] ([id number]) -------------------------------------------------------------------------------- Pain Assessment Details Patient Name: [name] Date of Service: 02/12/2017 1:30 PM Medical Record Number: [id number] Patient Account Number: [id number] Date of Birth/Sex: [birth year] (83 y.o. Male) Treating RN: Curtis Sites Primary Care Anielle Headrick: Darreld Mclean Other Clinician: Referring Caleah Tortorelli: Darreld Mclean Treating Harmon Bommarito/Extender: STONE III, HOYT Weeks in Treatment: 6 Active Problems Location of Pain Severity and Description of Pain Patient Has Paino No Site Locations Pain Management and  Medication Current Pain Management: Notes Topical or injectable lidocaine is offered to patient for acute pain when surgical debridement is performed. If needed, Patient is instructed to use over the counter pain medication for the following 24-48 hours after debridement. Wound care MDs do not prescribed pain medications. Patient has chronic pain or uncontrolled pain. Patient has been instructed to make an appointment with their Primary Care Physician for pain management. Electronic Signature(s) Signed: 02/12/2017 4:54:09 PM By: Curtis Sites Entered By: Curtis Sites on 02/12/2017 13:33:06 Suderman, [name] ([id number]) -------------------------------------------------------------------------------- Patient/Caregiver Education Details Patient Name: [name] Date of Service: 02/12/2017 1:30 PM Medical Record Number: [id number] Patient Account Number: [id number] Date of Birth/Gender: Dec 29, 1933 (83 y.o. Male) Treating RN: Curtis Sites Primary Care Physician: Darreld Mclean Other Clinician: Referring Physician: Darreld Mclean Treating Physician/Extender: Skeet Simmer in Treatment: 6 Education Assessment Education Provided To: Caregiver SNF nurses Education Topics Provided Wound/Skin Impairment: Handouts: Other: wound care orders Methods: Clinical cytogeneticist) Signed: 02/13/2017 5:10:53 PM By: Curtis Sites Entered By: Curtis Sites on 02/13/2017 07:52:47 Sanmiguel, [name] ([id number]) -------------------------------------------------------------------------------- Wound Assessment Details Patient Name: [name] Date of Service: 02/12/2017 1:30 PM Medical Record Number: [id number] Patient Account Number: [id number] Date of Birth/Sex: May 03, 1934 (83 y.o. Male) Treating RN: Curtis Sites Primary Care Ayisha Pol: Darreld Mclean Other Clinician: Referring Elayah Klooster: Darreld Mclean Treating Ahlana Slaydon/Extender: STONE III, HOYT Weeks in Treatment: 6 Wound Status Wound  Number: 1 Primary Arterial Insufficiency Ulcer Etiology: Wound Location: Right Ankle - Lateral Wound Open Wounding Event: Gradually Appeared Status: Date Acquired: 12/04/2016 Comorbid Hypertension, Peripheral Venous Weeks Of Treatment: 6 History: Disease, Gout, Osteoarthritis Clustered Wound: No Pending Amputation On Presentation Photos Photo Uploaded By: Elliot Gurney, BSN, RN, CWS, Kim on 02/12/2017 15:45:05 Wound Measurements Length: (cm) 1.2 %  Reduction in Width: (cm) 1.6 % Reduction in Depth: (cm) 0.1 Epithelializati Area: (cm) 1.508 Tunneling: Volume: (cm) 0.151 Undermining: Area: 73.6% Volume: 91.2% on: None No No Wound Description Full Thickness With Exposed Foul Odor After Classification: Support Structures Slough/Fibrino Wound Margin: Flat and Intact Exudate Large Amount: Exudate Type: Serous Exudate Color: amber Cleansing: No Yes Wound Bed Granulation Amount: Large (67-100%) Exposed Structure Granulation Quality: Red Fascia Exposed: No Windle, [name] ([id number]) Necrotic Amount: Small (1-33%) Fat Layer (Subcutaneous Tissue) Exposed: Yes Necrotic Quality: Adherent Slough Tendon Exposed: No Muscle Exposed: No Joint Exposed: No Bone Exposed: No Periwound Skin Texture Texture Color No Abnormalities Noted: No No Abnormalities Noted: No Callus: No Atrophie Blanche: No Crepitus: No Cyanosis: No Excoriation: No Ecchymosis: No Induration: No Erythema: No Rash: No Hemosiderin Staining: No Scarring: No Mottled: No Pallor: No Moisture Rubor: No No Abnormalities Noted: No Dry / Scaly: No Temperature / Pain Maceration: No Temperature: No Abnormality Tenderness on Palpation: Yes Wound Preparation Ulcer Cleansing: Rinsed/Irrigated with Saline Topical Anesthetic Applied: Other: lidocaine 4%, Treatment Notes Wound #1 (Right, Lateral Ankle) 1. Cleansed with: Clean wound with Normal Saline 2. Anesthetic Topical Lidocaine 4% cream to wound bed  prior to debridement 4. Dressing Applied: Aquacel Ag 5. Secondary Dressing Applied Guaze, ABD and kerlix/Conform 7. Secured with Tape Notes netting Electronic Signature(s) Signed: 02/12/2017 4:54:09 PM By: Curtis Sites Entered By: Curtis Sites on 02/12/2017 13:43:38 Loeffelholz, [name] ([id number]) -------------------------------------------------------------------------------- Wound Assessment Details Patient Name: [name], [name] Date of Service: 02/12/2017 1:30 PM Medical Record Number: [id number] Patient Account Number: [id number] Date of Birth/Sex: 18-Mar-1934 (83 y.o. Male) Treating RN: Curtis Sites Primary Care Dwight Adamczak: Darreld Mclean Other Clinician: Referring Belma Dyches: Darreld Mclean Treating Ashtan Laton/Extender: STONE III, HOYT Weeks in Treatment: 6 Wound Status Wound Number: 2 Primary Arterial Insufficiency Ulcer Etiology: Wound Location: Right Malleolus Wound Open Wounding Event: Gradually Appeared Status: Date Acquired: 12/04/2016 Comorbid Hypertension, Peripheral Venous Weeks Of Treatment: 6 History: Disease, Gout, Osteoarthritis Clustered Wound: No Pending Amputation On Presentation Photos Photo Uploaded By: Elliot Gurney, BSN, RN, CWS, Kim on 02/12/2017 15:45:54 Wound Measurements Length: (cm) 1 % Reduction in Width: (cm) 1.2 % Reduction in Depth: (cm) 0.1 Epithelializati Area: (cm) 0.942 Tunneling: Volume: (cm) 0.094 Undermining: Area: 81.5% Volume: 90.8% on: None No No Wound Description Full Thickness With Exposed Foul Odor After Classification: Support Structures Slough/Fibrino Wound Margin: Flat and Intact Exudate Large Amount: Exudate Type: Serous Exudate Color: amber Cleansing: No Yes Wound Bed Granulation Amount: Large (67-100%) Exposed Structure Granulation Quality: Red Fascia Exposed: No Lascano, [name] ([id number]) Necrotic Amount: Small (1-33%) Fat Layer (Subcutaneous Tissue) Exposed: Yes Necrotic Quality: Adherent Slough Tendon Exposed:  No Muscle Exposed: No Joint Exposed: No Bone Exposed: No Periwound Skin Texture Texture Color No Abnormalities Noted: No No Abnormalities Noted: No Callus: No Atrophie Blanche: No Crepitus: No Cyanosis: No Excoriation: No Ecchymosis: No Induration: No Erythema: No Rash: No Hemosiderin Staining: No Scarring: No Mottled: No Pallor: No Moisture Rubor: No No Abnormalities Noted: No Dry / Scaly: No Temperature / Pain Maceration: No Temperature: No Abnormality Tenderness on Palpation: Yes Wound Preparation Ulcer Cleansing: Rinsed/Irrigated with Saline Topical Anesthetic Applied: Other: lidocaine 4%, Treatment Notes Wound #2 (Right Malleolus) 1. Cleansed with: Clean wound with Normal Saline 2. Anesthetic Topical Lidocaine 4% cream to wound bed prior to debridement 4. Dressing Applied: Aquacel Ag 5. Secondary Dressing Applied Guaze, ABD and kerlix/Conform 7. Secured with Tape Notes netting Electronic Signature(s) Signed: 02/12/2017 4:54:09 PM By: Curtis Sites Entered By: Curtis Sites on 02/12/2017 13:44:01 [name], [name] (  [id number]) -------------------------------------------------------------------------------- Wound Assessment Details Patient Name: PRIDEMoses, [name] Date of Service: 02/12/2017 1:30 PM Medical Record Number: [id number] Patient Account Number: [id number] Date of Birth/Sex: 1934/02/12 (83 y.o. Male) Treating RN: Curtis Sites Primary Care Imogen Maddalena: Darreld Mclean Other Clinician: Referring Rufina Kimery: Darreld Mclean Treating Delesha Pohlman/Extender: STONE III, HOYT Weeks in Treatment: 6 Wound Status Wound Number: 3 Primary Arterial Insufficiency Ulcer Etiology: Wound Location: Right Toe Third Wound Open Wounding Event: Gradually Appeared Status: Date Acquired: 12/04/2016 Comorbid Hypertension, Peripheral Venous Weeks Of Treatment: 6 History: Disease, Gout, Osteoarthritis Clustered Wound: No Pending Amputation On Presentation Photos Photo Uploaded By:  Elliot Gurney, BSN, RN, CWS, Kim on 02/12/2017 15:46:20 Wound Measurements Length: (cm) 0.7 % Reduction in Width: (cm) 0.8 % Reduction in Depth: (cm) 0.1 Epithelializati Area: (cm) 0.44 Tunneling: Volume: (cm) 0.044 Undermining: Area: 87.8% Volume: 93.9% on: None No No Wound Description Full Thickness Without Exposed Foul Odor After Classification: Support Structures Due to Product Wound Margin: Flat and Intact Slough/Fibrino Exudate Large Amount: Exudate Type: Purulent Exudate Color: yellow, brown, green Cleansing: Yes Use: No No Wound Bed Granulation Amount: None Present (0%) Exposed Structure Necrotic Amount: Large (67-100%) Fascia Exposed: No Castor, [name] ([id number]) Necrotic Quality: Eschar Fat Layer (Subcutaneous Tissue) Exposed: No Tendon Exposed: No Muscle Exposed: No Joint Exposed: No Bone Exposed: No Periwound Skin Texture Texture Color No Abnormalities Noted: No No Abnormalities Noted: No Callus: No Atrophie Blanche: No Crepitus: No Cyanosis: No Excoriation: No Ecchymosis: No Induration: No Erythema: No Rash: No Hemosiderin Staining: No Scarring: No Mottled: No Pallor: No Moisture Rubor: No No Abnormalities Noted: No Dry / Scaly: No Temperature / Pain Maceration: No Temperature: No Abnormality Tenderness on Palpation: Yes Wound Preparation Ulcer Cleansing: Rinsed/Irrigated with Saline Topical Anesthetic Applied: Other: lidocaine 4%, Treatment Notes Wound #3 (Right Toe Third) 1. Cleansed with: Clean wound with Normal Saline 2. Anesthetic Topical Lidocaine 4% cream to wound bed prior to debridement 4. Dressing Applied: Aquacel Ag 5. Secondary Dressing Applied Guaze, ABD and kerlix/Conform 7. Secured with Tape Notes netting Electronic Signature(s) Signed: 02/12/2017 4:54:09 PM By: Curtis Sites Entered By: Curtis Sites on 02/12/2017 13:44:23 [name], [name]  ([id number]) -------------------------------------------------------------------------------- Wound Assessment Details Patient Name: [name], [name] Date of Service: 02/12/2017 1:30 PM Medical Record Number: [id number] Patient Account Number: [id number] Date of Birth/Sex: 1934-01-15 (83 y.o. Male) Treating RN: Curtis Sites Primary Care Zacherie Honeyman: Darreld Mclean Other Clinician: Referring Javonni Macke: Darreld Mclean Treating Holbert Caples/Extender: STONE III, HOYT Weeks in Treatment: 6 Wound Status Wound Number: 4 Primary Etiology: Arterial Insufficiency Ulcer Wound Location: Right, Plantar Metatarsal head Wound Status: Open fifth Wounding Event: Gradually Appeared Date Acquired: 01/16/2017 Weeks Of Treatment: 3 Clustered Wound: No Photos Photo Uploaded By: Curtis Sites on 02/13/2017 14:44:53 Wound Measurements Length: (cm) 0.1 Width: (cm) 0.1 Depth: (cm) 0.1 Area: (cm) 0.008 Volume: (cm) 0.001 % Reduction in Area: 99.4% % Reduction in Volume: 99.2% Wound Description Full Thickness Without Exposed Classification: Support Structures Periwound Skin Texture Texture Color No Abnormalities Noted: No No Abnormalities Noted: No Moisture No Abnormalities Noted: No Treatment Notes Nagy, [name] ([id number]) Wound #4 (Right, Plantar Metatarsal head fifth) 1. Cleansed with: Clean wound with Normal Saline 2. Anesthetic Topical Lidocaine 4% cream to wound bed prior to debridement 4. Dressing Applied: Aquacel Ag 5. Secondary Dressing Applied Guaze, ABD and kerlix/Conform 7. Secured with Tape Notes netting Electronic Signature(s) Signed: 02/12/2017 4:54:09 PM By: Curtis Sites Entered By: Curtis Sites on 02/12/2017 13:42:01 [name], [name] ([id number]) -------------------------------------------------------------------------------- Vitals Details Patient Name: [name] Date of Service: 02/12/2017 1:30 PM Medical Record  Number: [id number] Patient Account Number:  [id number] Date of Birth/Sex: 1933/08/23 (83 y.o. Male) Treating RN: Curtis Sites Primary Care Keirstan Iannello: Darreld Mclean Other Clinician: Referring Lee Kuang: Darreld Mclean Treating Amro Winebarger/Extender: STONE III, HOYT Weeks in Treatment: 6 Vital Signs Time Taken: 13:33 Temperature (F): 97.9 Pulse (bpm): 83 Respiratory Rate (breaths/min): 16 Blood Pressure (mmHg): 114/65 Reference Range: 80 - 120 mg / dl Electronic Signature(s) Signed: 02/12/2017 4:54:09 PM By: Curtis Sites Entered By: Curtis Sites on 02/12/2017 13:33:40

## 2017-02-19 ENCOUNTER — Encounter: Payer: Medicare HMO | Admitting: Physician Assistant

## 2017-02-19 DIAGNOSIS — I70235 Atherosclerosis of native arteries of right leg with ulceration of other part of foot: Secondary | ICD-10-CM | POA: Diagnosis not present

## 2017-02-20 NOTE — Progress Notes (Signed)
[place], [place] ([id number]) Visit Report for 02/19/2017 Chief Complaint Document Details Patient Name: [name], [name] Date of Service: 02/19/2017 9:30 AM Medical Record Number: [id number] Patient Account Number: [id number] Date of Birth/Sex: 08-31-1933 (83 y.o. Male) Treating RN: Montey Hora Primary Care Provider: Delight Stare Other Clinician: Referring Provider: Delight Stare Treating Provider/Extender: Melburn Hake, Jarvin Ogren Weeks in Treatment: 7 Information Obtained from: Patient Chief Complaint 12/30/16; patient arrives from [place] unaccompanied. He has 3 open wounds on the right foot and 2 threatened areas Electronic Signature(s) Signed: 02/19/2017 12:41:32 PM By: Worthy Keeler PA-C Entered By: Worthy Keeler on 02/19/2017 10:10:25 [name], [name] ([id number]) -------------------------------------------------------------------------------- HPI Details Patient Name: [name] Date of Service: 02/19/2017 9:30 AM Medical Record Number: [id number] Patient Account Number: [id number] Date of Birth/Sex: 06/16/1934 (83 y.o. Male) Treating RN: Montey Hora Primary Care Provider: Delight Stare Other Clinician: Referring Provider: Delight Stare Treating Provider/Extender: Melburn Hake, Amreen Raczkowski Weeks in Treatment: 7 History of Present Illness HPI Description: 12/30/16; this is an 83 year old man who comes from [place] skilled facility. He comes today unaccompanied and without any history. Looking through [facility] link I am able to see that he was admitted to hospital from 5/25 through 12/04/16 at that point with dehydration, severe hyponatremia acute kidney injury. He was treated with IV fluid replacement including 3% normal saline. Nowhere in his discharge summary did I see any reference to wounds on his feet and is mentioned he does not come in with any history. He does come in with a pack of cigarettes therefore I'm assuming he is a smoker. He is not a listed diabetic.  There is some reference in his notes that he has a DSS Education officer, museum is his guardian I'm not completely sure if that is true in any case the patient has some degree of dementia although he is able to tell me that he is 83 years old came from [place]. He is not able to give a history of these wounds. Our intake nurse notes maggots in the right third toe. Beyond this he has an area over the right lateral malleolus on the right lateral foot. He also has to threatened areas on the base of the right fifth metatarsal and on the lateral aspect of the right fifth metatarsal head. We are completely unable to do ABIs in the clinic largely because when we inflated the cuff he seemed to withdraw the foot. 01/05/17; the patient was seen urgently by Dr. Delana Meyer of vein and vascular. He agreed with our assessment of critical limb ischemia and set him up for an angiogram. Unfortunately the patient was not transported for preoperative evaluation therefore the angiogram has been canceled. I am not exactly sure if and when this is going to be rescheduled. The patient's wounds look about the same, right lateral ankle right lateral foot third toe. There is also concerning areas over the fifth met head and base of the fifth metatarsal which are not open but are covered by a dark eschar logorrhea and painful. We have been using silver alginate and x-ray of the right foot done at the facility showed no definite radiographic evidence of acute fracture or dislocation there was an old avulsion fracture of the proximal phalanx fifth digit as well as old fractures of the base of the proximal phalanx of the fifth digit. There is no comment about osteomyelitis 01/16/17 I have patient's record from his operative report from [facility] with Dr. Delana Meyer were fortunately patient underwent a successful angioplasty  in regard to his right lower extremity. The anterior tibial in-line flow was improved with less than  5% residual stenosis. Angioplasty of the SFA and popliteal arteries also yielded excellent results in post procedure testing with less than 10% residual stenosis. Overall patient appears to have done very well with this procedure. Hopefully this will translate into better wound healing in regard to his right lower extremity wounds. 01/22/2017 -- the patient comes with no dressings appropriate on his right lower extremity and the patient is rather non-verbal and coherent. He has no nursing or caregiver 02/12/17 on evaluation today patient appears to be doing a little better in regard to his wounds and the Narciso, BERTON ([id number]) dressings were in place at all for wound locations as they should be. With that being said he has some discomfort noted with cleansing of the wounds. No fevers, chills, nausea, or vomiting noted at this time. 02/19/17 on evaluation today patient appears to be doing well in regard to his wounds. All are measuring a little bit smaller at this time. He has been tolerating the dressing changes without complication. He does appear to be quite a bit drowsy today when questioned he tells me that he didn't sleep well last night due to arthritis. Otherwise he does not describe any pain at this time although she really did not talk much. No fevers, chills, nausea, or vomiting noted at this time. Electronic Signature(s) Signed: 02/19/2017 12:41:32 PM By: Worthy Keeler PA-C Entered By: Worthy Keeler on 02/19/2017 10:10:51 [name], [name] ([id number]) -------------------------------------------------------------------------------- Physical Exam Details Patient Name: [name] Date of Service: 02/19/2017 9:30 AM Medical Record Number: [id number] Patient Account Number: [id number] Date of Birth/Sex: Apr 06, 1934 (83 y.o. Male) Treating RN: Montey Hora Primary Care Provider: Delight Stare Other Clinician: Referring Provider: Delight Stare Treating Provider/Extender: STONE III,  Kayleeann Huxford Weeks in Treatment: 7 Constitutional Chronically ill appearing but in no apparent acute distress. Respiratory normal breathing without difficulty. clear to auscultation bilaterally. Cardiovascular regular rate and rhythm with normal S1, S2. Psychiatric Patient is not able to cooperate in decision making regarding care. Patient has dementia. patient is confused. Notes Patient's wounds appeared to be drying out and doing somewhat better on evaluation today. Fortunately there's no evidence of infection at any site at this time. He has been overall progressing well. Electronic Signature(s) Signed: 02/19/2017 12:41:32 PM By: Worthy Keeler PA-C Entered By: Worthy Keeler on 02/19/2017 10:12:02 Jablonowski, [name] ([id number]) -------------------------------------------------------------------------------- Physician Orders Details Patient Name: [name] Date of Service: 02/19/2017 9:30 AM Medical Record Number: [id number] Patient Account Number: [id number] Date of Birth/Sex: 1934-06-25 (83 y.o. 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 o May Shower, gently pat wound dry prior  to applying new dressing. Wound #2 Right Malleolus o Clean wound with Normal Saline. o May Shower, gently  pat wound dry prior to applying new dressing. Wound #3 Right Toe Third o Clean wound with Normal Saline. o May Shower, gently pat wound dry prior to applying new dressing. Wound #4 Right,Plantar Metatarsal head fifth o Clean wound with Normal Saline. o May Shower, gently pat wound dry prior to applying new dressing. Anesthetic Wound #1 Right,Lateral Ankle o Topical Lidocaine 4% cream applied to wound bed prior to debridement - In clinic only Wound #2 Right Malleolus o Topical Lidocaine 4% cream applied to wound bed prior to debridement - In clinic only Wound #3 Right Toe Third o Topical Lidocaine 4% cream applied to wound bed prior to debridement - In clinic only [name], [name] ([id number]) Wound #4 Right,Plantar Metatarsal head fifth o Topical Lidocaine 4% cream applied to wound bed prior to debridement - In clinic only Primary Wound Dressing Wound #1 Right,Lateral Ankle o Aquacel Ag Wound #2 Right Malleolus o Aquacel Ag Wound #3 Right Toe Third o Aquacel Ag Wound #4 Right,Plantar Metatarsal head fifth o Aquacel Ag Secondary Dressing Wound #1 Right,Lateral Ankle o Gauze and Kerlix/Conform Wound #2 Right Malleolus o Gauze and Kerlix/Conform Wound #3 Right Toe Third o Gauze and Kerlix/Conform Wound #4 Right,Plantar Metatarsal head fifth o Gauze and Kerlix/Conform Dressing Change Frequency Wound #1 Right,Lateral Ankle o Change dressing every day. Wound #2 Right Malleolus o Change dressing every day. Wound #3 Right Toe Third o Change dressing every day. Wound #4 Right,Plantar Metatarsal head fifth o Change dressing every day. Follow-up Appointments Wound #1 Right,Lateral Ankle o Return Appointment in 1 week. Wound #2 Right Malleolus [name], [name] ([id number]) o Return Appointment in 1 week. Wound #3 Right Toe  Third o Return Appointment in 1 week. Wound #4 Right,Plantar Metatarsal head fifth o Return Appointment in 1 week. Additional Orders / Instructions Wound #1 Right,Lateral Ankle o Stop Smoking o Increase protein intake. Wound #2 Right Malleolus o Stop Smoking o Increase protein intake. Wound #3 Right Toe Third o Stop Smoking o Increase protein intake. Wound #4 Right,Plantar Metatarsal head fifth o Stop Smoking o Increase protein intake. Notes Patient to be accompanied by family or staff member to future visits for the safety of the patient. Electronic Signature(s) Signed: 02/19/2017 12:41:32 PM By: Lenda Kelp PA-C Signed: 02/19/2017 1:20:34 PM By: Elliot Gurney, BSN, RN, CWS, Kim RN, BSN Entered By: Elliot Gurney, BSN, RN, CWS, Kim on 02/19/2017 10:14:02 [name], [name] ([id number]) -------------------------------------------------------------------------------- Problem List Details Patient Name: [name] Date of Service: 02/19/2017 9:30 AM Medical Record Number: [id number] Patient Account Number: [id number] Date of Birth/Sex: 08/22/1933 (83 y.o. 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 with 12/30/2016 Yes necrosis of muscle L97.513 Non-pressure chronic ulcer of other part of right foot with 12/30/2016 Yes necrosis of muscle I70.235 Atherosclerosis of native arteries of right leg with 12/30/2016 Yes ulceration of other part of foot I70.245 Atherosclerosis of native arteries of left leg with ulceration 12/30/2016 Yes of other part of foot F17.218 Nicotine dependence, cigarettes, with other  nicotine- 01/22/2017 Yes induced disorders Inactive Problems Resolved Problems Electronic Signature(s) Signed: 02/19/2017 12:41:32 PM By: Lenda Kelp PA-C Entered By: Lenda Kelp on 02/19/2017 10:10:14 [name], [name] ([id number]) -------------------------------------------------------------------------------- Progress Note Details Patient Name: [name], [name] Date of  Service: 02/19/2017 9:30 AM Medical Record Number: [id number] Patient Account Number: [id number] Date of Birth/Sex: 11-20-1933 (83 y.o. Male) Treating RN: Montey Hora Primary Care Provider: Delight Stare Other Clinician: Referring Provider: Delight Stare Treating Provider/Extender: Melburn Hake, Kalany Diekmann Weeks in Treatment: 7 Subjective Chief Complaint Information obtained from Patient 12/30/16; patient arrives from [place] unaccompanied. He has 3 open wounds on the right foot and 2 threatened areas History of Present Illness (HPI) 12/30/16; this is an 83 year old man who comes from [place] skilled facility. He comes today unaccompanied and without any history. Looking through [facility] link I am able to see that he was admitted to hospital from 5/25 through 12/04/16 at that point with dehydration, severe hyponatremia acute kidney injury. He was treated with IV fluid replacement including 3% normal saline. Nowhere in his discharge summary did I see any reference to wounds on his feet and is mentioned he does not come in with any history. He does come in with a pack of cigarettes therefore I'm assuming he is a smoker. He is not a listed diabetic. There is some reference in his notes that he has a DSS Education officer, [employer] is his guardian I'm not completely sure if that is true in any case the patient has some degree of dementia although he is able to tell me that he is 83 years old came from [place]. He is not able to give a history of these wounds. Our intake nurse notes maggots in the right third  toe. Beyond this he has an area over the right lateral malleolus on the right lateral foot. He also has to threatened areas on the base of the right fifth metatarsal and on the lateral aspect of the right fifth metatarsal head. We are completely unable to do ABIs in the clinic largely because when we inflated the cuff he seemed to withdraw the foot. 01/05/17; the patient was seen urgently by Dr. Delana Meyer of vein and vascular. He agreed with our assessment of critical limb ischemia and set him up for an angiogram. Unfortunately the patient was not transported for preoperative evaluation therefore the angiogram has been canceled. I am not exactly sure if and when this is going to be rescheduled. The patient's wounds look about the same, right lateral ankle right lateral foot third toe. There is also concerning areas over the fifth met head and base of the fifth metatarsal which are not open but are covered by a dark eschar logorrhea and painful. We have been using silver alginate and x-ray of the right foot done at the facility showed no definite radiographic evidence of acute fracture or dislocation there was an old avulsion fracture of the proximal phalanx fifth digit as well as old fractures of the base of the proximal phalanx of the fifth digit. There is no comment about osteomyelitis 01/16/17 I have patient's record from his operative report from [facility] with Dr. Delana Meyer were fortunately patient underwent a successful angioplasty in regard to his right lower extremity. The anterior tibial in-line flow was improved with less than 5% residual stenosis. Angioplasty of the SFA and popliteal arteries also yielded excellent results in post procedure testing with less than 10% residual [place], [name] ([id number]) stenosis. Overall patient appears to have done very well with this procedure. Hopefully this will translate into better wound healing in regard to his right lower extremity  wounds. 01/22/2017 -- the patient comes with no dressings appropriate on his right lower extremity and the patient is rather  non-verbal and coherent. He has no nursing or caregiver 02/12/17 on evaluation today patient appears to be doing a little better in regard to his wounds and the dressings were in place at all for wound locations as they should be. With that being said he has some discomfort noted with cleansing of the wounds. No fevers, chills, nausea, or vomiting noted at this time. 02/19/17 on evaluation today patient appears to be doing well in regard to his wounds. All are measuring a little bit smaller at this time. He has been tolerating the dressing changes without complication. He does appear to be quite a bit drowsy today when questioned he tells me that he didn't sleep well last night due to arthritis. Otherwise he does not describe any pain at this time although she really did not talk much. No fevers, chills, nausea, or vomiting noted at this time. Objective Constitutional Chronically ill appearing but in no apparent acute distress. Vitals Time Taken: 9:41 AM, Pulse: 81 bpm, Respiratory Rate: 16 breaths/min, Blood Pressure: 122/73 mmHg. Respiratory normal breathing without difficulty. clear to auscultation bilaterally. Cardiovascular regular rate and rhythm with normal S1, S2. Psychiatric Patient is not able to cooperate in decision making regarding care. Patient has dementia. patient is confused. General Notes: Patient's wounds appeared to be drying out and doing somewhat better on evaluation today. Fortunately there's no evidence of infection at any site at this time. He has been overall progressing well. Integumentary (Hair, Skin) Wound #1 status is Open. Original cause of wound was Gradually Appeared. The wound is located on the Right,Lateral Ankle. The wound measures 0.8cm length x 1.1cm width x 0.1cm depth; 0.691cm^2 area and Romagnoli, Maycen ([id number]) 0.069cm^3  volume. There is Fat Layer (Subcutaneous Tissue) Exposed exposed. There is no tunneling or undermining noted. There is a large amount of serous drainage noted. The wound margin is flat and intact. There is large (67-100%) red granulation within the wound bed. There is a small (1-33%) amount of necrotic tissue within the wound bed including Adherent Slough. The periwound skin appearance did not exhibit: Callus, Crepitus, Excoriation, Induration, Rash, Scarring, Dry/Scaly, Maceration, Atrophie Blanche, Cyanosis, Ecchymosis, Hemosiderin Staining, Mottled, Pallor, Rubor, Erythema. Periwound temperature was noted as No Abnormality. The periwound has tenderness on palpation. Wound #2 status is Open. Original cause of wound was Gradually Appeared. The wound is located on the Right Malleolus. The wound measures 0.5cm length x 0.5cm width x 0.1cm depth; 0.196cm^2 area and 0.02cm^3 volume. There is Fat Layer (Subcutaneous Tissue) Exposed exposed. There is no tunneling or undermining noted. There is a large amount of serous drainage noted. The wound margin is flat and intact. There is large (67-100%) red granulation within the wound bed. There is a small (1-33%) amount of necrotic tissue within the wound bed including Adherent Slough. The periwound skin appearance did not exhibit: Callus, Crepitus, Excoriation, Induration, Rash, Scarring, Dry/Scaly, Maceration, Atrophie Blanche, Cyanosis, Ecchymosis, Hemosiderin Staining, Mottled, Pallor, Rubor, Erythema. Periwound temperature was noted as No Abnormality. The periwound has tenderness on palpation. Wound #3 status is Open. Original cause of wound was Gradually Appeared. The wound is located on the Right Toe Third. The wound measures 0.5cm length x 0.7cm width x 0.1cm depth; 0.275cm^2 area and 0.027cm^3 volume. There is no tunneling or undermining noted. There is a large amount of purulent drainage noted. The wound margin is flat and intact. There is no  granulation within the wound bed. There is a large (67-100%) amount of necrotic tissue within the wound bed including  Eschar. The periwound skin appearance did not exhibit: Callus, Crepitus, Excoriation, Induration, Rash, Scarring, Dry/Scaly, Maceration, Atrophie Blanche, Cyanosis, Ecchymosis, Hemosiderin Staining, Mottled, Pallor, Rubor, Erythema. Periwound temperature was noted as No Abnormality. The periwound has tenderness on palpation. Wound #4 status is Open. Original cause of wound was Gradually Appeared. The wound is located on the Right,Plantar Metatarsal head fifth. The wound measures 0.1cm length x 0.1cm width x 0.1cm depth; 0.008cm^2 area and 0.001cm^3 volume. There is no tunneling or undermining noted. There is a none present amount of drainage noted. The wound margin is flat and intact. There is no granulation within the wound bed. There is no necrotic tissue within the wound bed. The periwound skin appearance exhibited: Callus. The periwound skin appearance did not exhibit: Crepitus, Excoriation, Induration, Rash, Scarring, Dry/Scaly, Maceration, Atrophie Blanche, Cyanosis, Ecchymosis, Hemosiderin Staining, Mottled, Pallor, Rubor, Erythema. Periwound temperature was noted as No Abnormality. The periwound has tenderness on palpation. 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 May Shower, gently pat wound dry prior to applying new dressing. Wound #2 Right Malleolus: Clean wound with Normal Saline. May Shower, gently pat wound dry prior to applying new dressing. Wound #3 Right Toe Third: Clean wound with Normal Saline. May Shower, gently pat wound dry prior to applying new dressing. Wound #4 Right,Plantar Metatarsal head fifth: Clean wound with Normal Saline. May Shower, gently pat wound dry prior to applying new dressing. Anesthetic: Wound #1 Right,Lateral Ankle: Topical Lidocaine 4% cream applied to wound bed prior to debridement - In clinic only Wound #2 Right Malleolus: Topical Lidocaine 4% cream applied to wound bed prior to debridement - In clinic only Wound #3 Right Toe Third: Topical Lidocaine 4% cream applied to wound bed prior to debridement - In clinic only Wound #4 Right,Plantar Metatarsal head fifth: Topical Lidocaine 4% cream applied to wound bed prior to debridement - In clinic only Primary Wound Dressing: Wound #1 Right,Lateral Ankle: Aquacel Ag Wound #2 Right Malleolus: Aquacel Ag Wound #3 Right Toe Third: Aquacel Ag Wound #4 Right,Plantar Metatarsal head fifth: Aquacel Ag Secondary Dressing: Wound #1 Right,Lateral Ankle: Gauze and Kerlix/Conform Wound #2 Right Malleolus: Gauze and Kerlix/Conform Wound #3 Right Toe Third: Gauze and Kerlix/Conform Wound #4 Right,Plantar Metatarsal head fifth: Gauze and Kerlix/Conform Dressing Change Frequency: [name], [name] ([id number]) Wound #1 Right,Lateral Ankle: Change dressing every day. Wound #2 Right Malleolus: Change dressing every day. Wound #3 Right Toe Third: Change dressing every day. Wound #4 Right,Plantar Metatarsal head fifth: Change dressing every day. Follow-up Appointments: Wound #1 Right,Lateral Ankle: Return Appointment in 1 week. Wound #2 Right Malleolus: Return Appointment in 1 week. Wound #3 Right Toe Third: Return Appointment in 1  week. Wound #4 Right,Plantar Metatarsal head fifth: Return Appointment in 1 week. Additional Orders / Instructions: Wound #1 Right,Lateral Ankle: Stop Smoking Increase protein intake. Wound #2 Right Malleolus: Stop Smoking Increase protein intake. Wound #3 Right Toe Third: Stop Smoking Increase protein intake. Wound #4 Right,Plantar Metatarsal  head fifth: Stop Smoking Increase protein intake. General Notes: Patient to be accompanied by family or staff member to future visits for the safety of the patient. To recommend that we continue with the Current wound care measures for this patient. We will see were things stand in one weeks time. I do recommend as well that we have someone accompany him from the facility when he comes for his appointments and this was discussed with the DON by Di Kindle today. We will see were things stand in one weeks time. Electronic Signature(s) Signed: 02/19/2017 12:41:32 PM By: Worthy Keeler PA-C Entered By: Worthy Keeler on 02/19/2017 12:36:57 [name], [name] ([id number]) -------------------------------------------------------------------------------- SuperBill Details Patient Name: [name], [name] Date of Service: 02/19/2017 Medical Record Number: [id number] Patient Account Number: [id number] Date of Birth/Sex: August 25, 1933 (83 y.o. 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 of other part of right foot with necrosis of muscle I70.235 Atherosclerosis of native arteries of right leg with ulceration of other part of foot I70.245 Atherosclerosis of native arteries of  left leg with ulceration of other part of foot F17.218 Nicotine dependence, cigarettes, with other nicotine-induced disorders Facility Procedures CPT4 Code: 87867672 Description: 99214 - WOUND CARE VISIT-LEV 4 EST PT Modifier: Quantity: 1 Physician Procedures CPT4: Description Modifier Quantity Code [id number] 99213 - WC PHYS LEVEL 3 - EST PT 1 ICD-10 Description Diagnosis L97.311 Non-pressure chronic ulcer of right ankle limited to breakdown of skin L97.513 Non-pressure chronic ulcer of other part of right foot  with necrosis of muscle 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

## 2017-02-23 NOTE — Progress Notes (Signed)
[name] ([id number]) Visit Report for 02/19/2017 Arrival Information Details Patient Name: [name], [name] Date of Service: 02/19/2017 9:30 AM Medical Record Number: [id number] Patient Account Number: [id number] Date of Birth/Sex: 07-25-1933 (83 y.o. Male) Treating RN: Montey Hora Primary Care Hadessah Grennan: Delight Stare Other Clinician: Referring Oliwia Berzins: Delight Stare Treating Dakhari Zuver/Extender: Melburn Hake, HOYT Weeks in Treatment: 7 Visit Information History Since Last Visit Added or deleted any medications: No Patient Arrived: Wheel Chair Any new allergies or adverse reactions: No Arrival Time: 09:40 Had a fall or experienced change in No Accompanied By: self activities of daily living that may affect Transfer Assistance: Manual risk of falls: Patient Identification Verified: Yes Signs or symptoms of abuse/neglect since last No Secondary Verification Process Yes visito Completed: Hospitalized since last visit: No Patient Has Alerts: Yes Has Dressing in Place as Prescribed: Yes Patient Alerts: ABI Pain Present Now: No inaudible Notes no bandage on 3rd toe wound and met head wound, only gauze and stretch netting on other 2 wounds Electronic Signature(s) Signed: 02/19/2017 1:20:34 PM By: Gretta Cool, BSN, RN, CWS, Kim RN, BSN Entered By: Gretta Cool, BSN, RN, CWS, Kim on 02/19/2017 11:13:48 Mcmahan, [name] ([id number]) -------------------------------------------------------------------------------- Clinic Level of Care Assessment Details Patient Name: [name] Date of Service: 02/19/2017 9:30 AM Medical Record Number: [id number] Patient Account Number: [id number] Date of Birth/Sex: [birth year] (83 y.o. Male) Treating RN: Cornell Barman Primary Care Shanea Karney: Delight Stare Other Clinician: Referring Abednego Yeates: Delight Stare Treating Treven Holtman/Extender: Melburn Hake, HOYT Weeks in Treatment: 7 Clinic Level of Care Assessment Items TOOL 4 Quantity Score _0  - Use when only an EandM is  performed on FOLLOW-UP visit 0 ASSESSMENTS - Nursing Assessment / Reassessment _1  - Reassessment of Co-morbidities (includes updates in patient status) 0 X - Reassessment of Adherence to Treatment Plan 1 5 ASSESSMENTS - Wound and Skin Assessment / Reassessment _2  - Simple Wound Assessment / Reassessment - one wound 0 X - Complex Wound Assessment / Reassessment - multiple wounds 4 5 _3  - Dermatologic / Skin Assessment (not related to wound area) 0 ASSESSMENTS - Focused Assessment _4  - Circumferential Edema Measurements - multi extremities 0 _5  - Nutritional Assessment / Counseling / Intervention 0 _6  - Lower Extremity Assessment (monofilament, tuning fork, pulses) 0 _7  - Peripheral Arterial Disease Assessment (using hand held doppler) 0 ASSESSMENTS - Ostomy and/or Continence Assessment and Care _8  - Incontinence Assessment and Management 0 _9  - Ostomy Care Assessment and Management (repouching, etc.) 0 PROCESS - Coordination of Care X - Simple Patient / Family Education for ongoing care 1 15 _10  - Complex (extensive) Patient / Family Education for ongoing care 0 _11  - Staff obtains Programmer, systems, Records, Test Results / Process Orders 0 X - Staff telephones HHA, Nursing Homes / Clarify orders / etc 1 10 _12  - Routine Transfer to another Facility (non-emergent condition) 0 Cardell, Rowena ([id number]) _13  - Routine Hospital Admission (non-emergent condition) 0 _14  - New Admissions / Biomedical engineer / Ordering NPWT, Apligraf, etc. 0 _15  - Emergency Hospital Admission (emergent condition) 0 X - Simple Discharge Coordination 1 10 _16  - Complex (extensive) Discharge Coordination 0 PROCESS - Special Needs _17  - Pediatric / Minor Patient Management 0 _18  - Isolation Patient Management 0 _19  - Hearing / Language / Visual special needs 0 _20  - Assessment of Community assistance (transportation, D/C planning, etc.) 0 _21  - Additional assistance / Altered mentation 0 _22  - Support Surface(s) Assessment  (bed, cushion, seat, etc.) 0 INTERVENTIONS - Wound Cleansing / Measurement _23  - Simple Wound Cleansing - one wound 0  X - Complex Wound Cleansing - multiple wounds 4 5 X - Wound Imaging (photographs - any number of wounds) 1 5 _0  - Wound Tracing (instead of photographs) 0 _1  - Simple Wound Measurement - one wound 0 X - Complex Wound Measurement - multiple wounds 4 5 INTERVENTIONS - Wound Dressings _2  - Small Wound Dressing one or multiple wounds 0 X - Medium Wound Dressing one or multiple wounds 1 15 _3  - Large Wound Dressing one or multiple wounds 0 <XQJJHERDEYCXKGYJ>_8<\/HUDJSHFWYOVZCHYI>_5  - Application of Medications - topical 0 <OYDXAJOINOMVEHMC>_9<\/OBSJGGEZMOQHUTML>_4  - Application of Medications - injection 0 INTERVENTIONS - Miscellaneous _6  - External ear exam 0 [name] ([id number]) _7  - Specimen Collection (cultures, biopsies, blood, body fluids, etc.) 0 _8  - Specimen(s) / Culture(s) sent or taken to Lab for analysis 0 _9  - Patient Transfer (multiple staff / Civil Service fast streamer / Similar devices) 0 _10  - Simple Staple / Suture removal (25 or less) 0 _11  - Complex Staple / Suture removal (26 or more) 0 _12  - Hypo / Hyperglycemic Management (close monitor of Blood Glucose) 0 _13  - Ankle / Brachial Index (ABI) - do not check if billed separately 0 X - Vital Signs 1 5 Has the patient been seen at the hospital within the last three years: Yes Total Score: 125 Level Of Care: New/Established - Level 4 Electronic Signature(s) Signed: 02/19/2017 1:20:34 PM By: Gretta Cool, BSN, RN, CWS, Kim RN, BSN Entered By: Gretta Cool, BSN, RN, CWS, Kim on 02/19/2017 10:10:34 Bryner, [name] ([id number]) -------------------------------------------------------------------------------- Encounter Discharge Information Details Patient Name: PRIDEWynn [name] Date of Service: 02/19/2017 9:30 AM Medical Record Number: [id number] Patient Account Number: [id number] Date of Birth/Sex: 09/12/1933 (83 y.o. Male) Treating RN: Montey Hora Primary Care Marlissa Emerick: Delight Stare Other Clinician: Referring  Lakyn Mantione: Delight Stare Treating Hilman Kissling/Extender: Melburn Hake, HOYT Weeks in Treatment: 7 Encounter Discharge Information Items Discharge Pain Level: 0 Discharge Condition: Stable Ambulatory Status: Wheelchair Discharge Destination: Nursing Home Transportation: Other Accompanied By: self Schedule Follow-up Appointment: Yes Medication Reconciliation completed and provided to Patient/Care No Jina Olenick: Provided on Clinical Summary of Care: 02/19/2017 Form Type Recipient Paper Patient LP Electronic Signature(s) Signed: 02/20/2017 1:46:33 PM By: Ruthine Dose Entered By: Ruthine Dose on 02/19/2017 10:19:34 Geiman, [name] ([id number]) -------------------------------------------------------------------------------- Lower Extremity Assessment Details Patient Name: PRIDEWynn [name] Date of Service: 02/19/2017 9:30 AM Medical Record Number: [id number] Patient Account Number: [id number] Date of Birth/Sex: 07/22/1933 (83 y.o. Male) Treating RN: Montey Hora Primary Care Mckena Chern: Delight Stare Other Clinician: Referring Larz Mark: Delight Stare Treating Deirdra Heumann/Extender: STONE III, HOYT Weeks in Treatment: 7 Vascular Assessment Pulses: Dorsalis Pedis Palpable: [Right:Yes] Posterior Tibial Extremity colors, hair growth, and conditions: Extremity Color: [Right:Normal] Hair Growth on Extremity: [Right:No] Temperature of Extremity: [Right:Warm] Capillary Refill: [Right:< 3 seconds] Electronic Signature(s) Signed: 02/19/2017 2:04:16 PM By: Montey Hora Entered By: Montey Hora on 02/19/2017 09:55:10 Masih, [name] ([id number]) -------------------------------------------------------------------------------- Multi Wound Chart Details Patient Name: [name] Date of Service: 02/19/2017 9:30 AM Medical Record Number: [id number] Patient Account Number: [id number] Date of Birth/Sex: 04-17-1934 (83 y.o. 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] Comorbid History: [1:Hypertension, Peripheral Hypertension, Peripheral Hypertension,  Peripheral Venous Disease, Gout, Osteoarthritis] [2:Venous Disease, Gout, Osteoarthritis] [3:Venous Disease, Gout, Osteoarthritis] Date Acquired: [1:12/04/2016] [2:12/04/2016] [3:12/04/2016] Weeks of Treatment: [1:7] [2:7] [3:7] Wound Status: [1:Open] [2:Open] [3:Open] Pending Amputation on Yes [2:Yes] [3:Yes] Presentation: Measurements L x W x D 0.8x1.1x0.1 [2:0.5x0.5x0.1] [3:0.5x0.7x0.1] (cm) Area (cm) : [1:0.691] [2:0.196] [3:0.275] Volume (cm) : [1:0.069] [2:0.02] [3:0.027] % Reduction in Area: [1:87.90%] [2:96.20%] [3:92.40%] % Reduction in Volume: 96.00% [2:98.00%] [3:96.30%] Classification: [1:Full Thickness With Exposed Support Structures] [2:Full Thickness With Exposed Support Structures] [3:Full Thickness Without Exposed Support Structures] Exudate Amount: [1:Large] [2:Large] [3:Large] Exudate Type: [1:Serous] [2:Serous] [3:Purulent] Exudate Color: [1:amber] [2:amber] [3:yellow, brown, green] Foul Odor After [1:No] [2:No] [3:Yes] Cleansing: Odor Anticipated Due to N/A [2:N/A] [3:No] Product Use: Wound Margin: [1:Flat and Intact] [2:Flat and Intact] [3:Flat and Intact] Granulation Amount: [1:Large (67-100%)] [2:Large (67-100%)] [3:None Present (0%)] Granulation Quality: Red Red N/A Necrotic Amount:  Small (1-33%) Small (1-33%) Large (67-100%) Necrotic Tissue: Adherent [place] Eschar Exposed Structures: Fat Layer (Subcutaneous Fat Layer (Subcutaneous Fascia: No Tissue) Exposed: Yes Tissue) Exposed: Yes Fat Layer (Subcutaneous Fascia: No Fascia: No Tissue) Exposed: No Tendon: No Tendon: No Tendon: No Muscle: No Muscle: No Muscle: No Joint: No Joint: No Joint: No Bone: No Bone: No Bone: No Epithelialization: None None None Periwound Skin Texture: Excoriation: No Excoriation: No Excoriation: No Induration: No Induration: No Induration: No Callus: No Callus: No Callus: No Crepitus: No Crepitus: No Crepitus: No Rash: No Rash: No Rash: No Scarring: No Scarring: No Scarring: No Periwound Skin Maceration: No Maceration: No Maceration: No Moisture: Dry/Scaly: No Dry/Scaly: No Dry/Scaly: No Periwound Skin Color: Atrophie Blanche: No Atrophie Blanche: No Atrophie Blanche: No Cyanosis: No Cyanosis: No Cyanosis: No Ecchymosis: No Ecchymosis: No Ecchymosis: No Erythema: No Erythema: No Erythema: No Hemosiderin Staining: No Hemosiderin Staining: No Hemosiderin Staining: No Mottled: No Mottled: No Mottled: No Pallor: No Pallor: No Pallor: No Rubor: No Rubor: No Rubor: No Temperature: No Abnormality No Abnormality No Abnormality Tenderness on Yes Yes Yes Palpation: Wound Preparation: Ulcer Cleansing: Ulcer Cleansing: Ulcer Cleansing: Rinsed/Irrigated with Rinsed/Irrigated with Rinsed/Irrigated with Saline Saline Saline Topical Anesthetic Topical Anesthetic Topical Anesthetic Applied: Other: lidocaine Applied: Other: lidocaine Applied: Other: lidocaine 4% 4% 4% Wound Number: 4 N/A N/A Photos: No Photos N/A N/A Wound Location: Right Metatarsal head fifth N/A N/A - Plantar Wounding Event: Gradually Appeared N/A N/A Primary Etiology: Arterial Insufficiency Ulcer N/A N/A Comorbid History: Hypertension, Peripheral N/A N/A Venous  Disease, Gout, Osteoarthritis Date Acquired: 01/16/2017 N/A N/A Weeks of Treatment: 4 N/A N/A Wound Status: Open N/A N/A Holzman, [name] ([id number]) Pending Amputation on No N/A N/A Presentation: Measurements L x W x D 0.1x0.1x0.1 N/A N/A (cm) Area (cm) : 0.008 N/A N/A Volume (cm) : 0.001 N/A N/A % Reduction in Area: 99.40% N/A N/A % Reduction in Volume: 99.20% N/A N/A Classification: Full Thickness Without N/A N/A Exposed Support Structures Exudate Amount: None Present N/A N/A Exudate Type: N/A N/A N/A Exudate Color: N/A N/A N/A Foul Odor After No N/A N/A Cleansing: Odor Anticipated Due to N/A N/A N/A Product Use: Wound Margin: Flat and Intact N/A N/A Granulation Amount: None Present (0%) N/A N/A Granulation Quality: N/A N/A N/A Necrotic Amount: None Present (0%) N/A N/A Necrotic Tissue: N/A N/A N/A Exposed Structures: Fascia: No N/A N/A Fat Layer (Subcutaneous Tissue) Exposed: No Tendon: No Muscle: No Joint: No Bone: No Epithelialization: None N/A N/A Periwound Skin Texture: Callus: Yes N/A N/A Excoriation: No Induration: No Crepitus: No Rash: No Scarring: No Periwound Skin Maceration:  No N/A N/A Moisture: Dry/Scaly: No Periwound Skin Color: Atrophie Blanche: No N/A N/A Cyanosis: No Ecchymosis: No Erythema: No Hemosiderin Staining: No Mottled: No Pallor: No Rubor: No Temperature: No Abnormality N/A N/A Tenderness on Yes N/A N/A Palpation: Waugh, [name] ([id number]) Wound Preparation: Ulcer Cleansing: N/A N/A Rinsed/Irrigated with Saline Topical Anesthetic Applied: Other: lidocaine 4% Treatment Notes Electronic Signature(s) Signed: 02/19/2017 1:20:34 PM By: Gretta Cool, BSN, RN, CWS, Kim RN, BSN Entered By: Gretta Cool, BSN, RN, CWS, Kim on 02/19/2017 10:04:28 Brooke, [name] ([id number]) -------------------------------------------------------------------------------- Multi-Disciplinary Care Plan Details Patient Name: PRIDEWynn [name] Date of Service:  02/19/2017 9:30 AM Medical Record Number: [id number] Patient Account Number: [id number] Date of Birth/Sex: 08-08-1933 (83 y.o. Male) Treating RN: Cornell Barman Primary Care Kamden Reber: Delight Stare Other Clinician: Referring Jams Trickett: Delight Stare Treating Yolande Skoda/Extender: Melburn Hake, HOYT Weeks in Treatment: 7 Active Inactive ` Abuse / Safety / Falls / Self Care Management Nursing Diagnoses: Potential for falls Goals: Patient will not experience any injury related to falls Date Initiated: 12/30/2016 Target Resolution Date: 02/27/2017 Goal Status: Active Interventions: Assess fall risk on admission and as needed Notes: ` Orientation to the Wound Care Program Nursing Diagnoses: Knowledge deficit related to the wound healing center program Goals: Patient/caregiver will verbalize understanding of the [place] Program Date Initiated: 12/30/2016 Target Resolution Date: 02/27/2017 Goal Status: Active Interventions: Provide education on orientation to the wound center Notes: ` Wound/Skin Impairment Nursing Diagnoses: Impaired tissue integrity Stegman, [name] ([id number]) Goals: Ulcer/skin breakdown will have a volume reduction of 30% by week 4 Date Initiated: 12/30/2016 Target Resolution Date: 02/27/2017 Goal Status: Active Ulcer/skin breakdown will have a volume reduction of 50% by week 8 Date Initiated: 12/30/2016 Target Resolution Date: 02/27/2017 Goal Status: Active Ulcer/skin breakdown will have a volume reduction of 80% by week 12 Date Initiated: 12/30/2016 Target Resolution Date: 02/27/2017 Goal Status: Active Ulcer/skin breakdown will heal within 14 weeks Date Initiated: 12/30/2016 Target Resolution Date: 02/27/2017 Goal Status: Active Interventions: Assess patient/caregiver ability to obtain necessary supplies Assess patient/caregiver ability to perform ulcer/skin care regimen upon admission and as needed Assess ulceration(s) every visit Notes: Electronic  Signature(s) Signed: 02/19/2017 1:20:34 PM By: Gretta Cool, BSN, RN, CWS, Kim RN, BSN Entered By: Gretta Cool, BSN, RN, CWS, Kim on 02/19/2017 10:04:03 Rohde, [name] ([id number]) -------------------------------------------------------------------------------- Pain Assessment Details Patient Name: [name] Date of Service: 02/19/2017 9:30 AM Medical Record Number: [id number] Patient Account Number: [id number] Date of Birth/Sex: 1933/10/10 (83 y.o. Male) Treating RN: Montey Hora Primary Care Eria Lozoya: Delight Stare Other Clinician: Referring Danzell Birky: Delight Stare Treating Britain Anagnos/Extender: STONE III, HOYT Weeks in Treatment: 7 Active Problems Location of Pain Severity and Description of Pain Patient Has Paino No Site Locations Pain Management and Medication Current Pain Management: Notes Topical or injectable lidocaine is offered to patient for acute pain when surgical debridement is performed. If needed, Patient is instructed to use over the counter pain medication for the following 24-48 hours after debridement. Wound care MDs do not prescribed pain medications. Patient has chronic pain or uncontrolled pain. Patient has been instructed to make an appointment with their Primary Care Physician for pain management. Electronic Signature(s) Signed: 02/19/2017 2:04:16 PM By: Montey Hora Entered By: Montey Hora on 02/19/2017 09:40:46 Geremia, [name] ([id number]) -------------------------------------------------------------------------------- Patient/Caregiver Education Details Patient Name: [name] Date of Service: 02/19/2017 9:30 AM Medical Record Number: [id number] Patient Account Number: [id number] Date of Birth/Gender: May 06, 1934 (83 y.o. Male) Treating RN: Montey Hora Primary Care Physician: Delight Stare Other Clinician: Referring Physician: Delight Stare Treating Physician/Extender: STONE III, HOYT Weeks in  Treatment: 7 Education Assessment Education Provided  To: Patient Education Topics Provided Wound/Skin Impairment: Handouts: Other: change dressing as ordered Methods: Demonstration, Explain/Verbal Responses: State content correctly Electronic Signature(s) Signed: 02/19/2017 2:04:16 PM By: Montey Hora Entered By: Montey Hora on 02/19/2017 10:18:41 Patino, [name] ([id number]) -------------------------------------------------------------------------------- Wound Assessment Details Patient Name: Karman, [name] Date of Service: 02/19/2017 9:30 AM Medical Record Number: [id number] Patient Account Number: [id number] Date of Birth/Sex: [birth year] (83 y.o. Male) Treating RN: Montey Hora Primary Care Ena Demary: Delight Stare Other Clinician: Referring Hart Haas: Delight Stare Treating Dan Scearce/Extender: STONE III, HOYT Weeks in Treatment: 7 Wound Status Wound Number: 1 Primary Arterial Insufficiency Ulcer Etiology: Wound Location: Right Ankle - Lateral Wound Open Wounding Event: Gradually Appeared Status: Date Acquired: 12/04/2016 Comorbid Hypertension, Peripheral Venous Weeks Of Treatment: 7 History: Disease, Gout, Osteoarthritis Clustered Wound: No Pending Amputation On Presentation Photos Photo Uploaded By: Montey Hora on 02/19/2017 14:00:56 Wound Measurements Length: (cm) 0.8 % Reduction in Width: (cm) 1.1 % Reduction in Depth: (cm) 0.1 Epithelializati Area: (cm) 0.691 Tunneling: Volume: (cm) 0.069 Undermining: Area: 87.9% Volume: 96% on: None No No Wound Description Full Thickness With Exposed Foul Odor After Classification: Support Structures Slough/Fibrino Wound Margin: Flat and Intact Exudate Large Amount: Exudate Type: Serous Exudate Color: amber Cleansing: No Yes Wound Bed Granulation Amount: Large (67-100%) Exposed Structure Granulation Quality: Red Fascia Exposed: No Laforge, [name] ([id number]) Necrotic Amount: Small (1-33%) Fat Layer (Subcutaneous Tissue) Exposed: Yes Necrotic Quality:  Adherent Slough Tendon Exposed: No Muscle Exposed: No Joint Exposed: No Bone Exposed: No Periwound Skin Texture Texture Color No Abnormalities Noted: No No Abnormalities Noted: No Callus: No Atrophie Blanche: No Crepitus: No Cyanosis: No Excoriation: No Ecchymosis: No Induration: No Erythema: No Rash: No Hemosiderin Staining: No Scarring: No Mottled: No Pallor: No Moisture Rubor: No No Abnormalities Noted: No Dry / Scaly: No Temperature / Pain Maceration: No Temperature: No Abnormality Tenderness on Palpation: Yes Wound Preparation Ulcer Cleansing: Rinsed/Irrigated with Saline Topical Anesthetic Applied: Other: lidocaine 4%, Treatment Notes Wound #1 (Right, Lateral Ankle) 1. Cleansed with: Clean wound with Normal Saline 2. Anesthetic Topical Lidocaine 4% cream to wound bed prior to debridement 4. Dressing Applied: Aquacel Ag 5. Secondary Dressing Applied ABD Pad Dry Gauze Kerlix/Conform 7. Secured with Tape Notes netting Electronic Signature(s) Signed: 02/19/2017 2:04:16 PM By: Montey Hora Entered By: Montey Hora on 02/19/2017 09:53:17 [name], [name] ([id number]) Truex, [name] ([id number]) -------------------------------------------------------------------------------- Wound Assessment Details Patient Name: [name] Date of Service: 02/19/2017 9:30 AM Medical Record Number: [id number] Patient Account Number: [id number] Date of Birth/Sex: 1934/01/03 (83 y.o. Male) Treating RN: Montey Hora Primary Care Jaheim Canino: Delight Stare Other Clinician: Referring Abed Schar: Delight Stare Treating Lorren Rossetti/Extender: STONE III, HOYT Weeks in Treatment: 7 Wound Status Wound Number: 2 Primary Arterial Insufficiency Ulcer Etiology: Wound Location: Right Malleolus Wound Open Wounding Event: Gradually Appeared Status: Date Acquired: 12/04/2016 Comorbid Hypertension, Peripheral Venous Weeks Of Treatment: 7 History: Disease, Gout, Osteoarthritis Clustered  Wound: No Pending Amputation On Presentation Photos Photo Uploaded By: Montey Hora on 02/19/2017 14:01:27 Wound Measurements Length: (cm) 0.5 % Reduction in Width: (cm) 0.5 % Reduction in Depth: (cm) 0.1 Epithelializati Area: (cm) 0.196 Tunneling: Volume: (cm) 0.02 Undermining: Area: 96.2% Volume: 98% on: None No No Wound Description Full Thickness With Exposed Foul Odor After Classification: Support Structures Slough/Fibrino Wound Margin: Flat and Intact Exudate Large Amount: Exudate Type: Serous Exudate Color: amber Cleansing: No Yes Wound Bed Granulation Amount: Large (67-100%) Exposed Structure Granulation Quality: Red Fascia Exposed: No Mcneish, Ty ([id number]) Necrotic Amount: Small (1-33%) Fat Layer (  Subcutaneous Tissue) Exposed: Yes Necrotic Quality: Adherent Slough Tendon Exposed: No Muscle Exposed: No Joint Exposed: No Bone Exposed: No Periwound Skin Texture Texture Color No Abnormalities Noted: No No Abnormalities Noted: No Callus: No Atrophie Blanche: No Crepitus: No Cyanosis: No Excoriation: No Ecchymosis: No Induration: No Erythema: No Rash: No Hemosiderin Staining: No Scarring: No Mottled: No Pallor: No Moisture Rubor: No No Abnormalities Noted: No Dry / Scaly: No Temperature / Pain Maceration: No Temperature: No Abnormality Tenderness on Palpation: Yes Wound Preparation Ulcer Cleansing: Rinsed/Irrigated with Saline Topical Anesthetic Applied: Other: lidocaine 4%, Treatment Notes Wound #2 (Right Malleolus) 1. Cleansed with: Clean wound with Normal Saline 2. Anesthetic Topical Lidocaine 4% cream to wound bed prior to debridement 4. Dressing Applied: Aquacel Ag 5. Secondary Dressing Applied ABD Pad Dry Gauze Kerlix/Conform 7. Secured with Tape Notes netting Electronic Signature(s) Signed: 02/19/2017 2:04:16 PM By: Montey Hora Entered By: Montey Hora on 02/19/2017 09:53:28 Kelleher, [name]  ([id number]) Muzyka, [name] ([id number]) -------------------------------------------------------------------------------- Wound Assessment Details Patient Name: Styles, [name] Date of Service: 02/19/2017 9:30 AM Medical Record Number: [id number] Patient Account Number: [id number] Date of Birth/Sex: [birth year] (83 y.o. Male) Treating RN: Montey Hora Primary Care Loralee Weitzman: Delight Stare Other Clinician: Referring Amayiah Gosnell: Delight Stare Treating Deliana Avalos/Extender: STONE III, HOYT Weeks in Treatment: 7 Wound Status Wound Number: 3 Primary Arterial Insufficiency Ulcer Etiology: Wound Location: Right Toe Third Wound Open Wounding Event: Gradually Appeared Status: Date Acquired: 12/04/2016 Comorbid Hypertension, Peripheral Venous Weeks Of Treatment: 7 History: Disease, Gout, Osteoarthritis Clustered Wound: No Pending Amputation On Presentation Photos Photo Uploaded By: Montey Hora on 02/19/2017 14:01:27 Wound Measurements Length: (cm) 0.5 % Reduction in Width: (cm) 0.7 % Reduction in Depth: (cm) 0.1 Epithelializati Area: (cm) 0.275 Tunneling: Volume: (cm) 0.027 Undermining: Area: 92.4% Volume: 96.3% on: None No No Wound Description Full Thickness Without Exposed Foul Odor After Classification: Support Structures Due to Product Wound Margin: Flat and Intact Slough/Fibrino Exudate Large Amount: Exudate Type: Purulent Exudate Color: yellow, brown, green Cleansing: Yes Use: No No Wound Bed Granulation Amount: None Present (0%) Exposed Structure Necrotic Amount: Large (67-100%) Fascia Exposed: No Greenley, [name] ([id number]) Necrotic Quality: Eschar Fat Layer (Subcutaneous Tissue) Exposed: No Tendon Exposed: No Muscle Exposed: No Joint Exposed: No Bone Exposed: No Periwound Skin Texture Texture Color No Abnormalities Noted: No No Abnormalities Noted: No Callus: No Atrophie Blanche: No Crepitus: No Cyanosis: No Excoriation: No Ecchymosis: No Induration:  No Erythema: No Rash: No Hemosiderin Staining: No Scarring: No Mottled: No Pallor: No Moisture Rubor: No No Abnormalities Noted: No Dry / Scaly: No Temperature / Pain Maceration: No Temperature: No Abnormality Tenderness on Palpation: Yes Wound Preparation Ulcer Cleansing: Rinsed/Irrigated with Saline Topical Anesthetic Applied: Other: lidocaine 4%, Treatment Notes Wound #3 (Right Toe Third) 1. Cleansed with: Clean wound with Normal Saline 2. Anesthetic Topical Lidocaine 4% cream to wound bed prior to debridement 4. Dressing Applied: Aquacel Ag 5. Secondary Dressing Applied ABD Pad Dry Gauze Kerlix/Conform 7. Secured with Tape Notes netting Electronic Signature(s) Signed: 02/19/2017 2:04:16 PM By: Montey Hora Entered By: Montey Hora on 02/19/2017 09:54:21 Ricke, [name] ([id number]) Derocher, [name] ([id number]) -------------------------------------------------------------------------------- Wound Assessment Details Patient Name: [name] Date of Service: 02/19/2017 9:30 AM Medical Record Number: [id number] Patient Account Number: [id number] Date of Birth/Sex: 11-05-1933 (83 y.o. Male) Treating RN: Montey Hora Primary Care Johndaniel Catlin: Delight Stare Other Clinician: Referring Rube Sanchez: Delight Stare Treating Durell Lofaso/Extender: STONE III, HOYT Weeks in Treatment: 7 Wound Status Wound Number: 4 Primary Arterial Insufficiency Ulcer Etiology: Wound Location: Right Metatarsal head fifth -  Plantar Wound Open Status: Wounding Event: Gradually Appeared Comorbid Hypertension, Peripheral Venous Date Acquired: 01/16/2017 History: Disease, Gout, Osteoarthritis Weeks Of Treatment: 4 Clustered Wound: No Photos Photo Uploaded By: Montey Hora on 02/19/2017 14:02:25 Wound Measurements Length: (cm) 0.1 % Reduction i Width: (cm) 0.1 % Reduction i Depth: (cm) 0.1 Epithelializa Area: (cm) 0.008 Tunneling: Volume: (cm) 0.001 Undermining: n Area: 99.4% n  Volume: 99.2% tion: None No No Wound Description Full Thickness Without Exposed Foul Odor Aft Classification: Support Structures Slough/Fibrin Wound Margin: Flat and Intact Exudate None Present Amount: er Cleansing: No o No Wound Bed Granulation Amount: None Present (0%) Exposed Structure Necrotic Amount: None Present (0%) Fascia Exposed: No Fat Layer (Subcutaneous Tissue) Exposed: No Tendon Exposed: No Omdahl, [name] ([id number]) Muscle Exposed: No Joint Exposed: No Bone Exposed: No Periwound Skin Texture Texture Color No Abnormalities Noted: No No Abnormalities Noted: No Callus: Yes Atrophie Blanche: No Crepitus: No Cyanosis: No Excoriation: No Ecchymosis: No Induration: No Erythema: No Rash: No Hemosiderin Staining: No Scarring: No Mottled: No Pallor: No Moisture Rubor: No No Abnormalities Noted: No Dry / Scaly: No Temperature / Pain Maceration: No Temperature: No Abnormality Tenderness on Palpation: Yes Wound Preparation Ulcer Cleansing: Rinsed/Irrigated with Saline Topical Anesthetic Applied: Other: lidocaine 4%, Treatment Notes Wound #4 (Right, Plantar Metatarsal head fifth) 1. Cleansed with: Clean wound with Normal Saline 2. Anesthetic Topical Lidocaine 4% cream to wound bed prior to debridement 4. Dressing Applied: Aquacel Ag 5. Secondary Dressing Applied ABD Pad Dry Gauze Kerlix/Conform 7. Secured with Tape Notes netting Electronic Signature(s) Signed: 02/19/2017 2:04:16 PM By: Montey Hora Entered By: Montey Hora on 02/19/2017 09:54:55 Sealey, [name] ([id number]) -------------------------------------------------------------------------------- Vitals Details Patient Name: [name] Date of Service: 02/19/2017 9:30 AM Medical Record Number: [id number] Patient Account Number: [id number] Date of Birth/Sex: 06-18-1934 (83 y.o. Male) Treating RN: Montey Hora Primary Care Anneth Brunell: Delight Stare Other Clinician: Referring  Shervin Cypert: Delight Stare Treating Merwin Breden/Extender: STONE III, HOYT Weeks in Treatment: 7 Vital Signs Time Taken: 09:41 Pulse (bpm): 81 Respiratory Rate (breaths/min): 16 Blood Pressure (mmHg): 122/73 Reference Range: 80 - 120 mg / dl Electronic Signature(s) Signed: 02/19/2017 2:04:16 PM By: Montey Hora Entered By: Montey Hora on 02/19/2017 09:42:11

## 2017-02-26 ENCOUNTER — Encounter: Payer: Medicare HMO | Admitting: Surgery

## 2017-02-26 DIAGNOSIS — I70235 Atherosclerosis of native arteries of right leg with ulceration of other part of foot: Secondary | ICD-10-CM | POA: Diagnosis not present

## 2017-02-28 NOTE — Progress Notes (Signed)
[name] [name] [name] ([id number]) Visit Report for 02/26/2017 Arrival Information Details Patient Name: [name] [name] [name] Date of Service: 02/26/2017 2:45 PM Medical Record Number: [id number] Patient Account Number: [id number] Date of Birth/Sex: [birth year] (83 y.o. Male) Treating RN: Huel Coventry Primary Care Dariana Garbett: Darreld Mclean Other Clinician: Referring Hanz Winterhalter: Darreld Mclean Treating Tyronne Blann/Extender: Rudene Re in Treatment: 8 Visit Information History Since Last Visit All ordered tests and consults were completed: No Patient Arrived: Wheel Chair Added or deleted any medications: No Arrival Time: 14:44 Any new allergies or adverse reactions: No Accompanied By: caseworker Had a fall or experienced change in No Transfer Assistance: Manual activities of daily living that may affect Patient Identification Verified: Yes risk of falls: Secondary Verification Process Yes Signs or symptoms of abuse/neglect since last No Completed: visito Patient Has Alerts: Yes Hospitalized since last visit: No Patient Alerts: ABI Has Dressing in Place as Prescribed: Yes inaudible Pain Present Now: No Electronic Signature(s) Signed: 02/26/2017 7:53:58 PM By: Elliot Gurney, BSN, RN, CWS, Kim RN, BSN Entered By: Elliot Gurney, BSN, RN, CWS, Kim on 02/26/2017 14:48:33 [name] [name] [name] ([id number]) -------------------------------------------------------------------------------- Encounter Discharge Information Details Patient Name: [name] [name] [name] Date of Service: 02/26/2017 2:45 PM Medical Record Number: [id number] Patient Account Number: [id number] Date of Birth/Sex: 04/26/1934 (83 y.o. Male) Treating RN: Curtis Sites Primary Care Ignazio Kincaid: Darreld Mclean Other Clinician: Referring Janne Faulk: Darreld Mclean Treating Avonte Sensabaugh/Extender: Rudene Re in Treatment: 8 Encounter Discharge Information Items Discharge Condition: Stable Ambulatory Status: Wheelchair Discharge Destination: Home Transportation: Private  Auto Accompanied By: caseworker Schedule Follow-up Appointment: Yes Medication Reconciliation completed and provided to Patient/Care Yes Ashan Cueva: Provided on Clinical Summary of Care: 02/26/2017 Form Type Recipient Paper Patient LP Electronic Signature(s) Signed: 02/26/2017 8:28:03 PM By: Elliot Gurney, BSN, RN, CWS, Kim RN, BSN Entered By: Elliot Gurney, BSN, RN, CWS, Kim on 02/26/2017 20:06:43 [name] [name] [name] ([id number]) -------------------------------------------------------------------------------- Lower Extremity Assessment Details Patient Name: [name] [name] [name] Date of Service: 02/26/2017 2:45 PM Medical Record Number: [id number] Patient Account Number: [id number] Date of Birth/Sex: 1934-02-10 (83 y.o. Male) Treating RN: Huel Coventry Primary Care Adrianne Shackleton: Darreld Mclean Other Clinician: Referring Alizaya Oshea: Darreld Mclean Treating Thadius Smisek/Extender: Rudene Re in Treatment: 8 Vascular Assessment Pulses: Dorsalis Pedis Palpable: [Right:Yes] Doppler Audible: [Right:Yes] Posterior Tibial Extremity colors, hair growth, and conditions: Extremity Color: [Right:Hyperpigmented] Temperature of Extremity: [Right:Warm] Capillary Refill: [Right:> 3 seconds] Toe Nail Assessment Left: Right: Thick: Yes Discolored: Yes Deformed: No Improper Length and Hygiene: Yes Electronic Signature(s) Signed: 02/26/2017 7:53:58 PM By: Elliot Gurney, BSN, RN, CWS, Kim RN, BSN Entered By: Elliot Gurney, BSN, RN, CWS, Kim on 02/26/2017 14:55:56 [name] [name] [name] ([id number]) -------------------------------------------------------------------------------- Multi Wound Chart Details Patient Name: [name] [name] [name] Date of Service: 02/26/2017 2:45 PM Medical Record Number: [id number] Patient Account Number: [id number] Date of Birth/Sex: 1934-02-15 (83 y.o. 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] Comorbid History: [1:Hypertension, Peripheral Hypertension, Peripheral Hypertension, Peripheral Venous Disease, Gout, Osteoarthritis] [2:Venous Disease, Gout, Osteoarthritis] [3:Venous Disease, Gout, Osteoarthritis] Date Acquired: [1:12/04/2016] [2:12/04/2016] [3:12/04/2016] Weeks of Treatment: [1:8] [2:8] [3:8] Wound Status: [1:Open] [2:Open] [3:Healed - Epithelialized] Pending Amputation on Yes [2:Yes] [3:Yes] Presentation: Measurements L x W x D 1x1.2x0.1 [2:1x1x0.1] [  3:0x0x0] (cm) Area (cm) : [1:0.942] [2:0.785] [3:0] Volume (cm) : [1:0.094] [2:0.079] [3:0] % Reduction in Area: [1:83.50%] [2:84.60%] [3:100.00%] % Reduction in Volume: 94.50% [2:92.30%] [3:100.00%] Classification: [1:Full Thickness With Exposed Support Structures] [2:Full Thickness With Exposed Support Structures] [3:Full Thickness Without Exposed Support Structures] Exudate Amount: [1:Large] [2:Large] [3:None Present] Exudate Type: [1:Serous] [2:Serous] [3:N/A] Exudate Color: [1:amber] [2:amber] [3:N/A] Wound Margin: [1:Flat and Intact] [2:Flat and Intact] [3:Flat and Intact] Granulation Amount: [1:None Present (0%)] [2:None Present (0%)] [3:None Present (0%)] Necrotic Amount: [1:Large (67-100%)] [2:Large (67-100%)] [3:None Present (0%)] Exposed Structures: [1:Fat Layer (Subcutaneous Fat Layer (Subcutaneous Fascia: No Tissue) Exposed: Yes Fascia: No] [2:Tissue) Exposed: Yes Fascia: No] [3:Fat Layer (Subcutaneous Tissue) Exposed:  No] Tendon: No Tendon: No Tendon: No Muscle: No Muscle: No Muscle: No Joint: No Joint: No Joint: No Bone: No Bone: No Bone: No Epithelialization: None None Large (67-100%) Debridement: Debridement (81191- Debridement (11042- N/A 11047) 11047) Pre-procedure 15:01 15:01 N/A Verification/Time Out Taken: Pain Control: Lidocaine 4% Topical Lidocaine 4% Topical N/A Solution Solution Tissue Debrided: Fibrin/Slough, Exudates, Fibrin/Slough, Exudates, N/A Subcutaneous Subcutaneous Level: Skin/Subcutaneous Skin/Subcutaneous N/A Tissue Tissue Debridement Area (sq 1.2 1 N/A cm): Instrument: Curette Curette N/A Bleeding: Minimum Minimum N/A Hemostasis Achieved: Pressure Pressure N/A Procedural Pain: 0 0 N/A Post Procedural Pain: 0 0 N/A Debridement Treatment Procedure was tolerated Procedure was tolerated N/A Response: well well Post Debridement 1x1.2x0.2 1x1x0.1 N/A Measurements L x W x D (cm) Post Debridement 0.188 0.079 N/A Volume: (cm) Periwound Skin Texture: Excoriation: No Excoriation: No Excoriation: No Induration: No Induration: No Induration: No Callus: No Callus: No Callus: No Crepitus: No Crepitus: No Crepitus: No Rash: No Rash: No Rash: No Scarring: No Scarring: No Scarring: No Periwound Skin Maceration: No Maceration: No Maceration: No Moisture: Dry/Scaly: No Dry/Scaly: No Dry/Scaly: No Periwound Skin Color: Atrophie Blanche: No Atrophie Blanche: No Atrophie Blanche: No Cyanosis: No Cyanosis: No Cyanosis: No Ecchymosis: No Ecchymosis: No Ecchymosis: No Erythema: No Erythema: No Erythema: No Hemosiderin Staining: No Hemosiderin Staining: No Hemosiderin Staining: No Mottled: No Mottled: No Mottled: No Pallor: No Pallor: No Pallor: No Rubor: No Rubor: No Rubor: No Temperature: No Abnormality No Abnormality No Abnormality Tenderness on Yes Yes Yes Palpation: Wound Preparation: Poudrier, [name] [name] [name] ([id number]) Ulcer Cleansing: Ulcer  Cleansing: Ulcer Cleansing: Rinsed/Irrigated with Rinsed/Irrigated with Rinsed/Irrigated with Saline Saline Saline Topical Anesthetic Topical Anesthetic Topical Anesthetic Applied: Other: lidocaine Applied: Other: lidocaine Applied: None 4% 4% Procedures Performed: Debridement N/A N/A Wound Number: 4 N/A N/A Photos: No Photos N/A N/A Wound Location: Right Metatarsal head fifth N/A N/A - Plantar Wounding Event: Gradually Appeared N/A N/A Primary Etiology: Arterial Insufficiency Ulcer N/A N/A Comorbid History: Hypertension, Peripheral N/A N/A Venous Disease, Gout, Osteoarthritis Date Acquired: 01/16/2017 N/A N/A Weeks of Treatment: 5 N/A N/A Wound Status: Open N/A N/A Pending Amputation on No N/A N/A Presentation: Measurements L x W x D 0x0x0 N/A N/A (cm) Area (cm) : 0 N/A N/A Volume (cm) : 0 N/A N/A % Reduction in Area: 100.00% N/A N/A % Reduction in Volume: 100.00% N/A N/A Classification: Full Thickness Without N/A N/A Exposed Support Structures Exudate Amount: None Present N/A N/A Exudate Type: N/A N/A N/A Exudate Color: N/A N/A N/A Wound Margin: Flat and Intact N/A N/A Granulation Amount: None Present (0%) N/A N/A Necrotic Amount: None Present (0%) N/A N/A Exposed Structures: Fascia: No N/A N/A Fat Layer (Subcutaneous Tissue) Exposed: No Tendon: No Muscle: No Joint: No Bone: No Epithelialization: Large (67-100%) N/A N/A Debridement: N/A N/A N/A Pain Control: N/A N/A  N/A Tissue Debrided: N/A N/A N/A Deckman, [name] [name] [name] ([id number]) Level: N/A N/A N/A Debridement Area (sq N/A N/A N/A cm): Instrument: N/A N/A N/A Bleeding: N/A N/A N/A Hemostasis Achieved: N/A N/A N/A Procedural Pain: N/A N/A N/A Post Procedural Pain: N/A N/A N/A Debridement Treatment N/A N/A N/A Response: Post Debridement N/A N/A N/A Measurements L x W x D (cm) Post Debridement N/A N/A N/A Volume: (cm) Periwound Skin Texture: Callus: Yes N/A N/A Excoriation: No Induration: No Crepitus:  No Rash: No Scarring: No Periwound Skin Maceration: No N/A N/A Moisture: Dry/Scaly: No Periwound Skin Color: Atrophie Blanche: No N/A N/A Cyanosis: No Ecchymosis: No Erythema: No Hemosiderin Staining: No Mottled: No Pallor: No Rubor: No Temperature: No Abnormality N/A N/A Tenderness on Yes N/A N/A Palpation: Wound Preparation: Ulcer Cleansing: N/A N/A Rinsed/Irrigated with Saline Topical Anesthetic Applied: None Procedures Performed: N/A N/A N/A Treatment Notes Electronic Signature(s) Signed: 02/26/2017 3:24:30 PM By: Evlyn Kanner MD, FACS Entered By: Evlyn Kanner on 02/26/2017 15:17:50 Braaten, [name] [name] [name] ([id number]) -------------------------------------------------------------------------------- Multi-Disciplinary Care Plan Details Patient Name: [name] [name] [name] Date of Service: 02/26/2017 2:45 PM Medical Record Number: [id number] Patient Account Number: [id number] Date of Birth/Sex: 20-Jul-1933 (83 y.o. Male) Treating RN: Huel Coventry Primary Care Detrich Rakestraw: Darreld Mclean Other Clinician: Referring Brayen Bunn: Darreld Mclean Treating Cherrell Maybee/Extender: Rudene Re in Treatment: 8 Active Inactive ` Abuse / Safety / Falls / Self Care Management Nursing Diagnoses: Potential for falls Goals: Patient will not experience any injury related to falls Date Initiated: 12/30/2016 Target Resolution Date: 02/27/2017 Goal Status: Active Interventions: Assess fall risk on admission and as needed Notes: ` Orientation to the Wound Care Program Nursing Diagnoses: Knowledge deficit related to the wound healing center program Goals: Patient/caregiver will verbalize understanding of the Wound Healing Center Program Date Initiated: 12/30/2016 Target Resolution Date: 02/27/2017 Goal Status: Active Interventions: Provide education on orientation to the wound center Notes: ` Wound/Skin Impairment Nursing Diagnoses: Impaired tissue integrity Gabrielsen, [name] [name] [name]  ([id number]) Goals: Ulcer/skin breakdown will have a volume reduction of 30% by week 4 Date Initiated: 12/30/2016 Target Resolution Date: 02/27/2017 Goal Status: Active Ulcer/skin breakdown will have a volume reduction of 50% by week 8 Date Initiated: 12/30/2016 Target Resolution Date: 02/27/2017 Goal Status: Active Ulcer/skin breakdown will have a volume reduction of 80% by week 12 Date Initiated: 12/30/2016 Target Resolution Date: 02/27/2017 Goal Status: Active Ulcer/skin breakdown will heal within 14 weeks Date Initiated: 12/30/2016 Target Resolution Date: 02/27/2017 Goal Status: Active Interventions: Assess patient/caregiver ability to obtain necessary supplies Assess patient/caregiver ability to perform ulcer/skin care regimen upon admission and as needed Assess ulceration(s) every visit Notes: Electronic Signature(s) Signed: 02/26/2017 7:53:58 PM By: Elliot Gurney, BSN, RN, CWS, Kim RN, BSN Entered By: Elliot Gurney, BSN, RN, CWS, Kim on 02/26/2017 14:57:29 Morss, [name] [name] [name] ([id number]) -------------------------------------------------------------------------------- Pain Assessment Details Patient Name: [name] [name] [name] Date of Service: 02/26/2017 2:45 PM Medical Record Number: [id number] Patient Account Number: [id number] Date of Birth/Sex: 06-25-1934 (83 y.o. Male) Treating RN: Huel Coventry Primary Care Karina Nofsinger: Darreld Mclean Other Clinician: Referring Carisha Kantor: Darreld Mclean Treating Towana Stenglein/Extender: Rudene Re in Treatment: 8 Active Problems Location of Pain Severity and Description of Pain Patient Has Paino No Site Locations Pain Management and Medication Current Pain Management: Electronic Signature(s) Signed: 02/26/2017 7:53:58 PM By: Elliot Gurney, BSN, RN, CWS, Kim RN, BSN Entered By: Elliot Gurney, BSN, RN, CWS, Kim on 02/26/2017 14:48:41 Burnett, [name] [name] [name] ([id number]) -------------------------------------------------------------------------------- Wound Assessment Details Patient Name: [name] [name] [name] Date of Service: 02/26/2017 2:45 PM Medical Record Number: [id number] Patient Account Number: [id number] Date of Birth/Sex: 1933-11-04 (83 y.o.  Male) Treating RN: Huel Coventry Primary Care Pantera Winterrowd: Darreld Mclean Other Clinician: Referring Suhaylah Wampole: Darreld Mclean Treating Nkechi Linehan/Extender: Rudene Re in Treatment: 8 Wound Status Wound Number: 1 Primary Arterial Insufficiency Ulcer Etiology: Wound Location: Right Ankle - Lateral Wound Open Wounding Event: Gradually Appeared Status: Date Acquired: 12/04/2016 Comorbid Hypertension, Peripheral Venous Weeks Of Treatment: 8 History: Disease, Gout, Osteoarthritis Clustered Wound: No Pending Amputation On Presentation Photos Photo Uploaded By: Elliot Gurney, BSN, RN, CWS, Kim on 02/27/2017 07:44:41 Wound Measurements Length: (cm) 1 % Reduction in Width: (cm) 1.2 % Reduction in Depth: (cm) 0.1 Epithelializati Area: (cm) 0.942 Tunneling: Volume: (cm) 0.094 Undermining: Area: 83.5% Volume: 94.5% on: None No No Wound Description Full Thickness With Exposed Foul Odor After Classification: Support Structures Slough/Fibrino Wound Margin: Flat and Intact Exudate Large Amount: Exudate Type: Serous Exudate Color: amber Cleansing: No Yes Wound Bed Granulation Amount: None Present (0%) Exposed Structure Necrotic Amount: Large (67-100%) Fascia Exposed: No Necrotic Quality: Adherent Slough Fat Layer (Subcutaneous Tissue) Exposed: Yes Tendon Exposed: No Nussbaumer, [name] [name] [name] ([id number]) Muscle Exposed: No Joint Exposed: No Bone Exposed: No Periwound Skin Texture Texture Color No Abnormalities Noted: No No Abnormalities Noted: No Callus: No Atrophie Blanche: No Crepitus: No Cyanosis: No Excoriation: No Ecchymosis: No Induration: No Erythema: No Rash: No Hemosiderin Staining: No Scarring: No Mottled: No Pallor: No Moisture Rubor: No No Abnormalities Noted: No Dry / Scaly: No Temperature / Pain Maceration:  No Temperature: No Abnormality Tenderness on Palpation: Yes Wound Preparation Ulcer Cleansing: Rinsed/Irrigated with Saline Topical Anesthetic Applied: Other: lidocaine 4%, Treatment Notes Wound #1 (Right, Lateral Ankle) 1. Cleansed with: Clean wound with Normal Saline 2. Anesthetic Topical Lidocaine 4% cream to wound bed prior to debridement 4. Dressing Applied: Prisma Ag 5. Secondary Dressing Applied Bordered Foam Dressing Electronic Signature(s) Signed: 02/26/2017 7:53:58 PM By: Elliot Gurney, BSN, RN, CWS, Kim RN, BSN Entered By: Elliot Gurney, BSN, RN, CWS, Kim on 02/26/2017 14:52:16 Amon, [name] [name] [name] ([id number]) -------------------------------------------------------------------------------- Wound Assessment Details Patient Name: Dondlinger, [name] [name] [name] Date of Service: 02/26/2017 2:45 PM Medical Record Number: [id number] Patient Account Number: [id number] Date of Birth/Sex: September 19, 1933 (83 y.o. Male) Treating RN: Huel Coventry Primary Care Mirel Hundal: Darreld Mclean Other Clinician: Referring Syria Kestner: Darreld Mclean Treating Nupur Hohman/Extender: Rudene Re in Treatment: 8 Wound Status Wound Number: 2 Primary Arterial Insufficiency Ulcer Etiology: Wound Location: Right Malleolus Wound Open Wounding Event: Gradually Appeared Status: Date Acquired: 12/04/2016 Comorbid Hypertension, Peripheral Venous Weeks Of Treatment: 8 History: Disease, Gout, Osteoarthritis Clustered Wound: No Pending Amputation On Presentation Photos Photo Uploaded By: Elliot Gurney, BSN, RN, CWS, Kim on 02/27/2017 07:44:42 Wound Measurements Length: (cm) 1 % Reduction in Width: (cm) 1 % Reduction in Depth: (cm) 0.1 Epithelializati Area: (cm) 0.785 Tunneling: Volume: (cm) 0.079 Undermining: Area: 84.6% Volume: 92.3% on: None No No Wound Description Full Thickness With Exposed Foul Odor After Classification: Support Structures Slough/Fibrino Wound Margin: Flat and Intact Exudate Large Amount: Exudate Type:  Serous Exudate Color: amber Cleansing: No Yes Wound Bed Granulation Amount: None Present (0%) Exposed Structure Necrotic Amount: Large (67-100%) Fascia Exposed: No Necrotic Quality: Adherent Slough Fat Layer (Subcutaneous Tissue) Exposed: Yes Tendon Exposed: No Schwering, [name] [name] [name] ([id number]) Muscle Exposed: No Joint Exposed: No Bone Exposed: No Periwound Skin Texture Texture Color No Abnormalities Noted: No No Abnormalities Noted: No Callus: No Atrophie Blanche: No Crepitus: No Cyanosis: No Excoriation: No Ecchymosis: No Induration: No Erythema: No Rash: No Hemosiderin Staining: No Scarring: No Mottled: No Pallor: No Moisture Rubor: No No Abnormalities Noted: No Dry / Scaly: No Temperature / Pain Maceration: No  Temperature: No Abnormality Tenderness on Palpation: Yes Wound Preparation Ulcer Cleansing: Rinsed/Irrigated with Saline Topical Anesthetic Applied: Other: lidocaine 4%, Treatment Notes Wound #2 (Right Malleolus) 1. Cleansed with: Clean wound with Normal Saline 2. Anesthetic Topical Lidocaine 4% cream to wound bed prior to debridement 4. Dressing Applied: Prisma Ag 5. Secondary Dressing Applied Bordered Foam Dressing Electronic Signature(s) Signed: 02/26/2017 7:53:58 PM By: Elliot Gurney, BSN, RN, CWS, Kim RN, BSN Entered By: Elliot Gurney, BSN, RN, CWS, Kim on 02/26/2017 14:52:46 Eliasen, [name] [name] [name] ([id number]) -------------------------------------------------------------------------------- Wound Assessment Details Patient Name: Gravelle, [name] [name] [name] Date of Service: 02/26/2017 2:45 PM Medical Record Number: [id number] Patient Account Number: [id number] Date of Birth/Sex: [birth year] (83 y.o. Male) Treating RN: Huel Coventry Primary Care Montrice Gracey: Darreld Mclean Other Clinician: Referring Mattia Liford: Darreld Mclean Treating Jhoan Schmieder/Extender: Rudene Re in Treatment: 8 Wound Status Wound Number: 3 Primary Arterial Insufficiency Ulcer Etiology: Wound Location: Right Toe  Third Wound Healed - Epithelialized Wounding Event: Gradually Appeared Status: Date Acquired: 12/04/2016 Comorbid Hypertension, Peripheral Venous Weeks Of Treatment: 8 History: Disease, Gout, Osteoarthritis Clustered Wound: No Pending Amputation On Presentation Photos Photo Uploaded By: Elliot Gurney, BSN, RN, CWS, Kim on 02/27/2017 07:45:08 Wound Measurements Length: (cm) 0 % Reduction in Width: (cm) 0 % Reduction in Depth: (cm) 0 Epithelializati Area: (cm) 0 Tunneling: Volume: (cm) 0 Undermining: Area: 100% Volume: 100% on: Large (67-100%) No No Wound Description Full Thickness Without Exposed Foul Odor After Classification: Support Structures Slough/Fibrino Wound Margin: Flat and Intact Exudate None Present Amount: Cleansing: No No Wound Bed Granulation Amount: None Present (0%) Exposed Structure Necrotic Amount: None Present (0%) Fascia Exposed: No Fat Layer (Subcutaneous Tissue) Exposed: No Tendon Exposed: No Muscle Exposed: No Joint Exposed: No Avalos, [name] [name] [name] ([id number]) Bone Exposed: No Periwound Skin Texture Texture Color No Abnormalities Noted: No No Abnormalities Noted: No Callus: No Atrophie Blanche: No Crepitus: No Cyanosis: No Excoriation: No Ecchymosis: No Induration: No Erythema: No Rash: No Hemosiderin Staining: No Scarring: No Mottled: No Pallor: No Moisture Rubor: No No Abnormalities Noted: No Dry / Scaly: No Temperature / Pain Maceration: No Temperature: No Abnormality Tenderness on Palpation: Yes Wound Preparation Ulcer Cleansing: Rinsed/Irrigated with Saline Topical Anesthetic Applied: None Electronic Signature(s) Signed: 02/26/2017 7:53:58 PM By: Elliot Gurney, BSN, RN, CWS, Kim RN, BSN Entered By: Elliot Gurney, BSN, RN, CWS, Kim on 02/26/2017 15:10:37 Lyn, [name] [name] [name] ([id number]) -------------------------------------------------------------------------------- Wound Assessment Details Patient Name: Maravilla, [name] [name] [name] Date of Service: 02/26/2017  2:45 PM Medical Record Number: [id number] Patient Account Number: [id number] Date of Birth/Sex: [birth year] (83 y.o. Male) Treating RN: Huel Coventry Primary Care Jonea Bukowski: Darreld Mclean Other Clinician: Referring Trevonte Ashkar: Darreld Mclean Treating Evelyna Folker/Extender: Rudene Re in Treatment: 8 Wound Status Wound Number: 4 Primary Arterial Insufficiency Ulcer Etiology: Wound Location: Right Metatarsal head fifth - Plantar Wound Open Status: Wounding Event: Gradually Appeared Comorbid Hypertension, Peripheral Venous Date Acquired: 01/16/2017 History: Disease, Gout, Osteoarthritis Weeks Of Treatment: 5 Clustered Wound: No Photos Photo Uploaded By: Elliot Gurney, BSN, RN, CWS, Kim on 02/27/2017 07:45:08 Wound Measurements Length: (cm) 0 % Reduction i Width: (cm) 0 % Reduction i Depth: (cm) 0 Epithelializa Area: (cm) 0 Tunneling: Volume: (cm) 0 Undermining: n Area: 100% n Volume: 100% tion: Large (67-100%) No No Wound Description Full Thickness Without Exposed Foul Odor Aft Classification: Support Structures Slough/Fibrin Wound Margin: Flat and Intact Exudate None Present Amount: er Cleansing: No o No Wound Bed Granulation Amount: None Present (0%) Exposed Structure Necrotic Amount: None Present (0%) Fascia Exposed: No Fat Layer (Subcutaneous Tissue) Exposed: No Tendon Exposed: No Muscle Exposed: No Joint Exposed: No  Blunck, [name] [name] [name] ([id number]) Bone Exposed: No Periwound Skin Texture Texture Color No Abnormalities Noted: No No Abnormalities Noted: No Callus: Yes Atrophie Blanche: No Crepitus: No Cyanosis: No Excoriation: No Ecchymosis: No Induration: No Erythema: No Rash: No Hemosiderin Staining: No Scarring: No Mottled: No Pallor: No Moisture Rubor: No No Abnormalities Noted: No Dry / Scaly: No Temperature / Pain Maceration: No Temperature: No Abnormality Tenderness on Palpation: Yes Wound Preparation Ulcer Cleansing: Rinsed/Irrigated with  Saline Topical Anesthetic Applied: None Electronic Signature(s) Signed: 02/26/2017 7:53:58 PM By: Elliot Gurney, BSN, RN, CWS, Kim RN, BSN Entered By: Elliot Gurney, BSN, RN, CWS, Kim on 02/26/2017 15:08:03 Fentress, [name] [name] [name] ([id number]) -------------------------------------------------------------------------------- Vitals Details Patient Name: [name] [name] [name] Date of Service: 02/26/2017 2:45 PM Medical Record Number: [id number] Patient Account Number: [id number] Date of Birth/Sex: 04/06/1934 (83 y.o. Male) Treating RN: Huel Coventry Primary Care Ariyan Sinnett: Darreld Mclean Other Clinician: Referring Vinson Tietze: Darreld Mclean Treating Lovell Roe/Extender: Rudene Re in Treatment: 8 Vital Signs Time Taken: 14:48 Temperature (F): 97.4 Pulse (bpm): 97 Respiratory Rate (breaths/min): 16 Blood Pressure (mmHg): 127/90 Reference Range: 80 - 120 mg / dl Electronic Signature(s) Signed: 02/26/2017 7:53:58 PM By: Elliot Gurney, BSN, RN, CWS, Kim RN, BSN Entered By: Elliot Gurney, BSN, RN, CWS, Kim on 02/26/2017 14:49:13

## 2017-03-05 ENCOUNTER — Encounter: Payer: Medicare HMO | Admitting: Surgery

## 2017-03-05 DIAGNOSIS — I70235 Atherosclerosis of native arteries of right leg with ulceration of other part of foot: Secondary | ICD-10-CM | POA: Diagnosis not present

## 2017-03-06 NOTE — Progress Notes (Signed)
Sweigert, Nuevo ([id number]) Visit Report for 02/26/2017 Chief Complaint Document Details Patient Name: [name], [name] Date of Service: 02/26/2017 2:45 PM Medical Record Number: [id number] Patient Account Number: [id number] Date of Birth/Sex: [birth year] (83 y.o. Male) Treating RN: Montey Hora Primary Care Provider: Delight Stare Other Clinician: Referring Provider: Delight Stare Treating Provider/Extender: Frann Rider in Treatment: 8 Information Obtained from: Patient Chief Complaint 12/30/16; patient arrives from [place] unaccompanied. He has 3 open wounds on the right foot and 2 threatened areas Electronic Signature(s) Signed: 02/26/2017 3:24:30 PM By: Christin Fudge MD, FACS Entered By: Christin Fudge on 02/26/2017 15:19:15 Erekson, [name] ([id number]) -------------------------------------------------------------------------------- Debridement Details Patient Name: [name] Date of Service: 02/26/2017 2:45 PM Medical Record Number: [id number] Patient Account Number: [id number] Date of Birth/Sex: 03/14/1934 (83 y.o. Male) Treating RN: Montey Hora Primary Care Provider: Delight Stare Other Clinician: Referring Provider: Delight Stare Treating Provider/Extender: Frann Rider in Treatment: 8 Debridement Performed for Wound #1 Right,Lateral Ankle Assessment: Performed By: Physician Christin Fudge, MD Debridement: Debridement Severity of Tissue Pre Fat layer exposed Debridement: Pre-procedure Verification/Time Out Yes - 15:01 Taken: Start Time: 15:02 Pain Control: Lidocaine 4% Topical Solution Level: Skin/Subcutaneous Tissue Total Area Debrided (L x 1 (cm) x 1.2 (cm) = 1.2 (cm) W): Tissue and other Viable, Non-Viable, Exudate, Fibrin/Slough, Subcutaneous material debrided: Instrument: Curette Bleeding: Minimum Hemostasis Achieved: Pressure End Time: 15:03 Procedural Pain: 0 Post Procedural Pain: 0 Response to Treatment: Procedure was  tolerated well Post Debridement Measurements of Total Wound Length: (cm) 1 Width: (cm) 1.2 Depth: (cm) 0.2 Volume: (cm) 0.188 Character of Wound/Ulcer Post Requires Further Debridement Debridement: Severity of Tissue Post Debridement: Fat layer exposed Post Procedure Diagnosis Same as Pre-procedure Electronic Signature(s) Signed: 02/26/2017 3:24:30 PM By: Christin Fudge MD, FACS Signed: 03/04/2017 5:39:07 PM By: Montey Hora Impson, [name] ([id number]) Entered By: Christin Fudge on 02/26/2017 15:18:03 Clements, [name] ([id number]) -------------------------------------------------------------------------------- Debridement Details Patient Name: [name] Date of Service: 02/26/2017 2:45 PM Medical Record Number: [id number] Patient Account Number: [id number] Date of Birth/Sex: September 29, 1933 (83 y.o. Male) Treating RN: Montey Hora Primary Care Provider: Delight Stare Other Clinician: Referring Provider: Delight Stare Treating Provider/Extender: Frann Rider in Treatment: 8 Debridement Performed for Wound #2 Right Malleolus Assessment: Performed By: Physician Christin Fudge, MD Debridement: Debridement Severity of Tissue Pre Fat layer exposed Debridement: Pre-procedure Verification/Time Out Yes - 15:01 Taken: Start Time: 15:02 Pain Control: Lidocaine 4% Topical Solution Level: Skin/Subcutaneous Tissue Total Area Debrided (L x 1 (cm) x 1 (cm) = 1 (cm) W): Tissue and other Viable, Non-Viable, Exudate, Fibrin/Slough, Subcutaneous material debrided: Instrument: Curette Bleeding: Minimum Hemostasis Achieved: Pressure End Time: 15:03 Procedural Pain: 0 Post Procedural Pain: 0 Response to Treatment: Procedure was tolerated well Post Debridement Measurements of Total Wound Length: (cm) 1 Width: (cm) 1 Depth: (cm) 0.1 Volume: (cm) 0.079 Character of Wound/Ulcer Post Requires Further Debridement Debridement: Severity of Tissue Post Debridement: Fat layer  exposed Post Procedure Diagnosis Same as Pre-procedure Electronic Signature(s) Signed: 02/26/2017 3:24:30 PM By: Christin Fudge MD, FACS Signed: 03/04/2017 5:39:07 PM By: Montey Hora Skarzynski, [name] ([id number]) Entered By: Christin Fudge on 02/26/2017 15:19:08 Conley, [name] ([id number]) -------------------------------------------------------------------------------- HPI Details Patient Name: [name] Date of Service: 02/26/2017 2:45 PM Medical Record Number: [id number] Patient Account Number: [id number] Date of Birth/Sex: [birth year] (83 y.o. Male) Treating RN: Montey Hora Primary Care Provider: Delight Stare Other Clinician: Referring Provider: Delight Stare Treating Provider/Extender: Frann Rider in Treatment: 8 History of Present Illness HPI Description: 12/30/16; this is an 83 year old man who comes from  [facility]. He comes today unaccompanied and without any history. Looking through [facility] link I am able to see that he was admitted to hospital from 5/25 through 12/04/16 at that point with dehydration, severe hyponatremia acute kidney injury. He was treated with IV fluid replacement including 3% normal saline. Nowhere in his discharge summary did I see any reference to wounds on his feet and is mentioned he does not come in with any history. He does come in with a pack of cigarettes therefore I'm assuming he is a smoker. He is not a listed diabetic. There is some reference in his notes that he has a DSS Education officer, museum is his guardian I'm not completely sure if that is true in any case the patient has some degree of dementia although he is able to tell me that he is 83 years old came from [place]. He is not able to give a history of these wounds. Our intake nurse notes maggots in the right third toe. Beyond this he has an area over the right lateral malleolus on the right lateral foot. He also has to threatened areas on the base of the  right fifth metatarsal and on the lateral aspect of the right fifth metatarsal head. We are completely unable to do ABIs in the clinic largely because when we inflated the cuff he seemed to withdraw the foot. 01/05/17; the patient was seen urgently by Dr. Delana Meyer of vein and vascular. He agreed with our assessment of critical limb ischemia and set him up for an angiogram. Unfortunately the patient was not transported for preoperative evaluation therefore the angiogram has been canceled. I am not exactly sure if and when this is going to be rescheduled. The patient's wounds look about the same, right lateral ankle right lateral foot third toe. There is also concerning areas over the fifth met head and base of the fifth metatarsal which are not open but are covered by a dark eschar logorrhea and painful. We have been using silver alginate and x-ray of the right foot done at the facility showed no definite radiographic evidence of acute fracture or dislocation there was an old avulsion fracture of the proximal phalanx fifth digit as well as old fractures of the base of the proximal phalanx of the fifth digit. There is no comment about osteomyelitis 01/16/17 I have patient's record from his operative report from [facility] with Dr. Delana Meyer were fortunately patient underwent a successful angioplasty in regard to his right lower extremity. The anterior tibial in-line flow was improved with less than 5% residual stenosis. Angioplasty of the SFA and popliteal arteries also yielded excellent results in post procedure testing with less than 10% residual stenosis. Overall patient appears to have done very well with this procedure. Hopefully this will translate into better wound healing in regard to his right lower extremity wounds. 01/22/2017 -- the patient comes with no dressings appropriate on his right lower extremity and the patient is rather non-verbal and coherent. He has no nursing or  caregiver 02/12/17 on evaluation today patient appears to be doing a little better in regard to his wounds and the Hughlett, ALMUS ([id number]) dressings were in place at all for wound locations as they should be. With that being said he has some discomfort noted with cleansing of the wounds. No fevers, chills, nausea, or vomiting noted at this time. 02/19/17 on evaluation today patient appears to be doing well in regard to his wounds. All are measuring a  little bit smaller at this time. He has been tolerating the dressing changes without complication. He does appear to be quite a bit drowsy today when questioned he tells me that he didn't sleep well last night due to arthritis. Otherwise he does not describe any pain at this time although she really did not talk much. No fevers, chills, nausea, or vomiting noted at this time. 02/26/2017 -- we have been having problems having the patient come here without any caregivers and today his caseworker was here and we had a detailed discussion with her regarding his treatment plan and the lack of coordinated care at his facility. She will look into this. Electronic Signature(s) Signed: 02/26/2017 3:24:30 PM By: Christin Fudge MD, FACS Entered By: Christin Fudge on 02/26/2017 15:19:50 Dorsi, [name] ([id number]) -------------------------------------------------------------------------------- Physical Exam Details Patient Name: [name] Date of Service: 02/26/2017 2:45 PM Medical Record Number: [id number] Patient Account Number: [id number] Date of Birth/Sex: [birth year] (82 y.o. Male) Treating RN: Montey Hora Primary Care Provider: Delight Stare Other Clinician: Referring Provider: Delight Stare Treating Provider/Extender: Frann Rider in Treatment: 8 Constitutional . Pulse regular. Respirations normal and unlabored. Afebrile. . Eyes Nonicteric. Reactive to light. Ears, Nose, Mouth, and Throat Lips, teeth, and gums WNL.Marland Kitchen Moist mucosa without  lesions. Neck supple and nontender. No palpable supraclavicular or cervical adenopathy. Normal sized without goiter. Respiratory WNL. No retractions.. Cardiovascular Pedal Pulses WNL. No clubbing, cyanosis or edema. Lymphatic No adneopathy. No adenopathy. No adenopathy. Musculoskeletal Adexa without tenderness or enlargement.. Digits and nails w/o clubbing, cyanosis, infection, petechiae, ischemia, or inflammatory conditions.. Integumentary (Hair, Skin) No suspicious lesions. No crepitus or fluctuance. No peri-wound warmth or erythema. No masses.Marland Kitchen Psychiatric Judgement and insight Intact.. No evidence of depression, anxiety, or agitation.. Notes sharp debridement was done for the wounds on the right ankle and the right malleolar region and there has been good progress. The wound on the plantar aspect of the right foot has completely healed. Electronic Signature(s) Signed: 02/26/2017 3:24:30 PM By: Christin Fudge MD, FACS Entered By: Christin Fudge on 02/26/2017 15:20:21 Crossett, [name] ([id number]) -------------------------------------------------------------------------------- Physician Orders Details Patient Name: [name] Date of Service: 02/26/2017 2:45 PM Medical Record Number: [id number] Patient Account Number: [id number] Date of Birth/Sex: 09-13-1933 (83 y.o. Male) Treating RN: Cornell Barman Primary Care Provider: Delight Stare Other Clinician: Referring Provider: Delight Stare Treating Provider/Extender: Frann Rider in Treatment: 8 Verbal / Phone Orders: No Diagnosis Coding Wound Cleansing Wound #1 Right,Lateral Ankle o Clean wound with Normal Saline. o May Shower, gently pat wound dry prior to applying new dressing. Wound #2 Right Malleolus o Clean wound with Normal Saline. o May Shower, gently pat wound dry prior to applying new dressing. Anesthetic Wound #1 Right,Lateral Ankle o Topical Lidocaine 4% cream applied to wound bed prior to debridement -  In clinic only Wound #2 Right Malleolus o Topical Lidocaine 4% cream applied to wound bed prior to debridement - In clinic only Primary Wound Dressing Wound #1 Right,Lateral Ankle o Prisma Ag - silver collagen (moisten with saline) Wound #2 Right Malleolus o Prisma Ag - silver collagen (moisten with saline) Secondary Dressing Wound #1 Right,Lateral Ankle o Dry Gauze o Boardered Foam Dressing Wound #2 Right Malleolus o Dry Gauze o Boardered Foam Dressing Dressing Change Frequency Wound #1 Right,Lateral Ankle o Change dressing every other day. Olgin, [name] ([id number]) Wound #2 Right Malleolus o Change dressing every other day. Follow-up Appointments Wound #1 Right,Lateral Ankle o Return Appointment in 1 week. Wound #2 Right Malleolus o Return Appointment  in 1 week. Additional Orders / Instructions Wound #1 Right,Lateral Ankle o Stop Smoking o Increase protein intake. Wound #2 Right Malleolus o Stop Smoking o Increase protein intake. Electronic Signature(s) Signed: 02/26/2017 3:24:30 PM By: Christin Fudge MD, FACS Signed: 02/26/2017 7:53:58 PM By: Gretta Cool, BSN, RN, CWS, Kim RN, BSN Entered By: Gretta Cool, BSN, RN, CWS, Kim on 02/26/2017 15:11:52 Stennett, [name] ([id number]) -------------------------------------------------------------------------------- Problem List Details Patient Name: [name] Date of Service: 02/26/2017 2:45 PM Medical Record Number: [id number] Patient Account Number: [id number] Date of Birth/Sex: [birth year] (83 y.o. 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.513 Non-pressure chronic ulcer of other part of right foot with 12/30/2016 Yes necrosis of muscle I70.235 Atherosclerosis of native arteries of right leg with 12/30/2016 Yes ulceration of other part of foot I70.245 Atherosclerosis of native arteries of left leg with ulceration 12/30/2016 Yes of other part of foot F17.218 Nicotine dependence, cigarettes, 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 Male) Treating RN: Montey Hora Primary Care Provider: Delight Stare Other Clinician: Referring Provider: Delight Stare Treating Provider/Extender: Frann Rider in Treatment: 8 Subjective Chief Complaint Information obtained from Patient 12/30/16; patient arrives from [place] unaccompanied. He has 3 open wounds on the right foot and 2 threatened areas History of Present Illness (HPI) 12/30/16; this is an 83 year old man who comes from [place] skilled facility. He comes today unaccompanied and without any history. Looking through [facility] link I am able to see that he was admitted to hospital from 5/25 through 12/04/16 at that point with dehydration, severe hyponatremia acute kidney injury. He was treated with IV fluid replacement including 3% normal saline. Nowhere in his discharge summary did I see any reference to wounds on his feet and is mentioned he does not come in with any history. He does come in with a pack of cigarettes therefore I'm  assuming he is a smoker. He is not a listed diabetic. There is some reference in his notes that he has a DSS Education officer, museum is his guardian I'm not completely sure if that is true in any case the patient has some degree of dementia although he is able to tell me that he is 83 years old came from [place]. He is not able to give a history of these wounds. Our intake nurse notes maggots in the right third toe. Beyond this he has an area over the right lateral malleolus on the right lateral foot. He also has to threatened areas on the base of the right fifth metatarsal and on the lateral aspect of the right fifth metatarsal head. We are completely unable to do ABIs in the clinic largely because when we inflated the cuff he seemed to withdraw the foot. 01/05/17; the patient was seen urgently by Dr. Delana Meyer of vein and vascular. He agreed with our assessment of critical limb ischemia and set him  up for an angiogram. Unfortunately the patient was not transported for preoperative evaluation therefore the angiogram has been canceled. I am not exactly sure if and when this is going to be rescheduled. The patient's wounds look about the same, right lateral ankle right lateral foot third toe. There is also concerning areas over the fifth met head and base of the fifth metatarsal which are not open but are covered by a dark eschar logorrhea and painful. We have been using silver alginate and x-ray of the right foot done at the facility showed no definite radiographic evidence of acute fracture or dislocation there was an old avulsion fracture of the proximal phalanx fifth digit as well as old fractures of the base of the proximal phalanx of the fifth digit. There is no comment about osteomyelitis 01/16/17 I have patient's record from his operative report from [facility] with Dr. Delana Meyer were fortunately patient underwent a successful angioplasty in regard to his right lower extremity.  The anterior tibial in-line flow was improved with less than 5% residual stenosis. Angioplasty of the SFA and popliteal arteries also yielded excellent results in post procedure testing with less than 10% residual Wetsel, [name] ([id number]) stenosis. Overall patient appears to have done very well with this procedure. Hopefully this will translate into better wound healing in regard to his right lower extremity wounds. 01/22/2017 -- the patient comes with no dressings appropriate on his right lower extremity and the patient is rather non-verbal and coherent. He has no nursing or caregiver 02/12/17 on evaluation today patient appears to be doing a little better in regard to his wounds and the dressings were in place at all for wound locations as they should be. With that being said he has some discomfort noted with cleansing of the wounds. No fevers, chills, nausea, or vomiting noted at this time. 02/19/17 on evaluation today patient appears to be doing well in regard to his wounds. All are measuring a little bit smaller at this time. He has been tolerating the dressing changes without complication. He does appear to be quite a bit drowsy today when questioned he tells me that he didn't sleep well last night due to arthritis. Otherwise he does not describe any pain at this time although she really did not talk much. No fevers, chills, nausea, or vomiting noted at this time. 02/26/2017 -- we have been having problems having the patient come here without any caregivers and today his caseworker was here and we had a detailed discussion with her regarding his treatment plan and the lack of coordinated care at his facility. She will look into this. Objective Constitutional Pulse regular. Respirations normal and unlabored. Afebrile. Vitals Time Taken: 2:48 PM, Temperature: 97.4 F, Pulse: 97 bpm, Respiratory Rate: 16 breaths/min, Blood Pressure: 127/90 mmHg. Eyes Nonicteric. Reactive to light. Ears,  Nose, Mouth, and Throat Lips, teeth, and gums WNL.Marland Kitchen Moist mucosa without lesions. Neck supple and nontender. No palpable supraclavicular or cervical adenopathy. Normal sized without goiter. Respiratory WNL. No retractions.. Cardiovascular Toppin, [name] ([id number]) Pedal Pulses WNL. No clubbing, cyanosis or edema. Lymphatic No adneopathy. No adenopathy. No adenopathy. Musculoskeletal Adexa without tenderness or enlargement.. Digits and nails w/o clubbing, cyanosis, infection, petechiae, ischemia, or inflammatory conditions.Marland Kitchen Psychiatric Judgement and insight Intact.. No evidence of depression, anxiety, or agitation.. General Notes: sharp debridement was done for the wounds on the right ankle and the right malleolar region and there has been good progress. The wound on the plantar aspect of the right foot has  completely healed. Integumentary (Hair, Skin) No suspicious lesions. No crepitus or fluctuance. No peri-wound warmth or erythema. No masses.. Wound #1 status is Open. Original cause of wound was Gradually Appeared. The wound is located on the Right,Lateral Ankle. The wound measures 1cm length x 1.2cm width x 0.1cm depth; 0.942cm^2 area and 0.094cm^3 volume. There is Fat Layer (Subcutaneous Tissue) Exposed exposed. There is no tunneling or undermining noted. There is a large amount of serous drainage noted. The wound margin is flat and intact. There is no granulation within the wound bed. There is a large (67-100%) amount of necrotic tissue within the wound bed including Adherent Slough. The periwound skin appearance did not exhibit: Callus, Crepitus, Excoriation, Induration, Rash, Scarring, Dry/Scaly, Maceration, Atrophie Blanche, Cyanosis, Ecchymosis, Hemosiderin Staining, Mottled, Pallor, Rubor, Erythema. Periwound temperature was noted as No Abnormality. The periwound has tenderness on palpation. Wound #2 status is Open. Original cause of wound was Gradually Appeared. The wound  is located on the Right Malleolus. The wound measures 1cm length x 1cm width x 0.1cm depth; 0.785cm^2 area and 0.079cm^3 volume. There is Fat Layer (Subcutaneous Tissue) Exposed exposed. There is no tunneling or undermining noted. There is a large amount of serous drainage noted. The wound margin is flat and intact. There is no granulation within the wound bed. There is a large (67-100%) amount of necrotic tissue within the wound bed including Adherent Slough. The periwound skin appearance did not exhibit: Callus, Crepitus, Excoriation, Induration, Rash, Scarring, Dry/Scaly, Maceration, Atrophie Blanche, Cyanosis, Ecchymosis, Hemosiderin Staining, Mottled, Pallor, Rubor, Erythema. Periwound temperature was noted as No Abnormality. The periwound has tenderness on palpation. Wound #3 status is Healed - Epithelialized. Original cause of wound was Gradually Appeared. The wound is located on the Right Toe Third. The wound measures 0cm length x 0cm width x 0cm depth; 0cm^2 area and 0cm^3 volume. There is no tunneling or undermining noted. There is a none present amount of drainage noted. The wound margin is flat and intact. There is no granulation within the wound bed. There is no necrotic tissue within the wound bed. The periwound skin appearance did not exhibit: Callus, Crepitus, Excoriation, Induration, Rash, Scarring, Dry/Scaly, Maceration, Atrophie Blanche, Cyanosis, Ecchymosis, Hemosiderin Staining, Mottled, Pallor, Rubor, Erythema. Periwound temperature was noted as No Abnormality. The periwound has tenderness on palpation. Wound #4 status is Open. Original cause of wound was Gradually Appeared. The wound is located on the Right,Plantar Metatarsal head fifth. The wound measures 0cm length x 0cm width x 0cm depth; 0cm^2 area Murdaugh, [name] ([id number]) and 0cm^3 volume. There is no tunneling or undermining noted. There is a none present amount of drainage noted. The wound margin is flat and  intact. There is no granulation within the wound bed. There is no necrotic tissue within the wound bed. The periwound skin appearance exhibited: Callus. The periwound skin appearance did not exhibit: Crepitus, Excoriation, Induration, Rash, Scarring, Dry/Scaly, Maceration, Atrophie Blanche, Cyanosis, Ecchymosis, Hemosiderin Staining, Mottled, Pallor, Rubor, Erythema. Periwound temperature was noted as No Abnormality. The periwound has tenderness on palpation. 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 There was a Skin/Subcutaneous Tissue Debridement ([id number]) debridement with total area of 1.2 sq cm performed by Christin Fudge, MD. with the following instrument(s): Curette to remove Viable and Non-Viable tissue/material including Exudate, Fibrin/Slough, and Subcutaneous after achieving pain control using Lidocaine 4% Topical Solution. A time out was conducted at 15:01, prior to the start of the procedure. A Minimum amount of bleeding was controlled with Pressure. The procedure was tolerated well with a pain level of 0 throughout and a pain level of 0 following the procedure. Post Debridement Measurements: 1cm length x 1.2cm width x 0.2cm depth; 0.188cm^3 volume. Character of Wound/Ulcer Post  Debridement requires further debridement. Severity of Tissue Post Debridement is: Fat layer exposed. Post procedure Diagnosis Wound #1: Same as Pre-Procedure Wound #2 Pre-procedure diagnosis of 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 There was a Skin/Subcutaneous Tissue Debridement ([id number]) debridement with total area of 1 sq cm performed by Christin Fudge, MD. with the following instrument(s): Curette to remove Viable and Non-Viable tissue/material including Exudate, Fibrin/Slough, and Subcutaneous after achieving pain control using Lidocaine 4% Topical Solution. A time Mizrahi, SATURNINO ([id number]) out was conducted at 15:01, prior to the start of the procedure. A Minimum amount of bleeding was controlled with Pressure. The procedure was tolerated well with a pain level of 0 throughout and a pain level of 0 following the procedure. Post Debridement Measurements: 1cm length x 1cm width x 0.1cm depth; 0.079cm^3 volume. Character of Wound/Ulcer Post Debridement requires further debridement. Severity of Tissue Post Debridement is: Fat layer exposed. Post procedure Diagnosis Wound #2: Same as Pre-Procedure Plan Wound Cleansing: Wound #1 Right,Lateral Ankle: Clean wound with Normal Saline. May Shower, gently pat wound dry prior to applying new dressing. Wound #2 Right Malleolus: Clean wound with Normal Saline. May Shower, gently pat wound dry prior to applying new dressing. Anesthetic: Wound #1 Right,Lateral Ankle: Topical Lidocaine 4% cream applied to wound bed prior to debridement - In clinic only Wound #2 Right Malleolus: Topical Lidocaine 4% cream applied to wound bed prior to debridement - In clinic only Primary Wound Dressing: Wound #1 Right,Lateral Ankle: Prisma Ag - silver collagen (moisten with saline) Wound #2 Right Malleolus: Prisma Ag - silver collagen (moisten with  saline) Secondary Dressing: Wound #1 Right,Lateral Ankle: Dry Gauze Boardered Foam Dressing Wound #2 Right Malleolus: Dry Gauze Boardered Foam Dressing Dressing Change Frequency: Wound #1 Right,Lateral Ankle: Change dressing every other day. Wound #2 Right Malleolus: Change dressing every other day. Follow-up Appointments: Wound #1 Right,Lateral Ankle: Return Appointment in 1 week. Wound #2 Right Malleolus: Return Appointment in 1 week. Additional Orders / Instructions: Rathe, [name] ([id number]) Wound #1 Right,Lateral Ankle: Stop Smoking Increase protein intake. Wound #2 Right Malleolus: Stop Smoking Increase protein intake. After review and sharp debridement of the patient's wounds today, I have recommended: 1. we use silver collagen over all these areas and offload them appropriately. 2. stopping smoking has been reiterated 3. discusses care in detail with his case manager who is at the bedside today we will also communicate with the nursing staff at the facility to make sure they do appropriate dressing changes Electronic Signature(s) Signed: 02/26/2017 3:24:30 PM By: Christin Fudge MD, FACS Entered By: Christin Fudge on 02/26/2017 15:21:50 Pikus, [name] (  [id number]) -------------------------------------------------------------------------------- SuperBill Details Patient Name: Wessels, [name] Date of Service: 02/26/2017 Medical Record Number: [id number] Patient Account Number: [id number] Date of Birth/Sex: 1934-05-16 (83 y.o. 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 F17.218 Nicotine dependence, cigarettes, with other nicotine-induced disorders Facility Procedures CPT4: Description Modifier Quantity Code [id number] 11042 - DEB SUBQ TISSUE 20 SQ CM/< 1 ICD-10 Description Diagnosis L97.311 Non-pressure chronic ulcer of right ankle limited to breakdown of skin L97.513 Non-pressure chronic ulcer of other part of right  foot with necrosis of muscle 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

## 2017-03-07 NOTE — Progress Notes (Signed)
[name] ([id number]) Visit Report for 03/05/2017 Arrival Information Details Patient Name: [name], [name] Date of Service: 03/05/2017 10:15 AM Medical Record Number: [id number] Patient Account Number: [id number] Date of Birth/Sex: 1934-04-23 (83 y.o. Male) Treating RN: Curtis Sites Primary Care Leanah Kolander: Darreld Mclean Other Clinician: Referring Isaly Fasching: Darreld Mclean Treating Alexandr Yaworski/Extender: Rudene Re in Treatment: 9 Visit Information History Since Last Visit Added or deleted any medications: No Patient Arrived: Wheel Chair Any new allergies or adverse reactions: No Arrival Time: 10:10 Had a fall or experienced change in No Accompanied By: social activities of daily living that may affect worker risk of falls: Transfer Assistance: Manual Signs or symptoms of abuse/neglect since last No Patient Identification Verified: Yes visito Secondary Verification Process Yes Hospitalized since last visit: No Completed: Has Dressing in Place as Prescribed: Yes Patient Has Alerts: Yes Pain Present Now: No Patient Alerts: ABI inaudible Electronic Signature(s) Signed: 03/05/2017 4:50:02 PM By: Curtis Sites Entered By: Curtis Sites on 03/05/2017 10:12:21 Portal, Rainey [name] ([id number]) -------------------------------------------------------------------------------- Clinic Level of Care Assessment Details Patient Name: [name] Date of Service: 03/05/2017 10:15 AM Medical Record Number: [id number] Patient Account Number: [id number] Date of Birth/Sex: 05-17-1934 (83 y.o. Male) Treating RN: Curtis Sites Primary Care Bee Hammerschmidt: Darreld Mclean Other Clinician: Referring Tinamarie Przybylski: Darreld Mclean Treating Krystalyn Kubota/Extender: Rudene Re in Treatment: 9 Clinic Level of Care Assessment Items TOOL 4 Quantity Score []  - Use when only an EandM is performed on FOLLOW-UP visit 0 ASSESSMENTS - Nursing Assessment / Reassessment X - Reassessment of Co-morbidities (includes  updates in patient status) 1 10 X - Reassessment of Adherence to Treatment Plan 1 5 ASSESSMENTS - Wound and Skin Assessment / Reassessment []  - Simple Wound Assessment / Reassessment - one wound 0 X - Complex Wound Assessment / Reassessment - multiple wounds 2 5 []  - Dermatologic / Skin Assessment (not related to wound area) 0 ASSESSMENTS - Focused Assessment []  - Circumferential Edema Measurements - multi extremities 0 []  - Nutritional Assessment / Counseling / Intervention 0 X - Lower Extremity Assessment (monofilament, tuning fork, pulses) 1 5 []  - Peripheral Arterial Disease Assessment (using hand held doppler) 0 ASSESSMENTS - Ostomy and/or Continence Assessment and Care []  - Incontinence Assessment and Management 0 []  - Ostomy Care Assessment and Management (repouching, etc.) 0 PROCESS - Coordination of Care X - Simple Patient / Family Education for ongoing care 1 15 []  - Complex (extensive) Patient / Family Education for ongoing care 0 []  - Staff obtains Chiropractor, Records, Test Results / Process Orders 0 []  - Staff telephones HHA, Nursing Homes / Clarify orders / etc 0 []  - Routine Transfer to another Facility (non-emergent condition) 0 [place], [place] ([id number]) []  - Routine Hospital Admission (non-emergent condition) 0 []  - New Admissions / Manufacturing engineer / Ordering NPWT, Apligraf, etc. 0 []  - Emergency Hospital Admission (emergent condition) 0 X - Simple Discharge Coordination 1 10 []  - Complex (extensive) Discharge Coordination 0 PROCESS - Special Needs []  - Pediatric / Minor Patient Management 0 []  - Isolation Patient Management 0 []  - Hearing / Language / Visual special needs 0 []  - Assessment of Community assistance (transportation, D/C planning, etc.) 0 []  - Additional assistance / Altered mentation 0 []  - Support Surface(s) Assessment (bed, cushion, seat, etc.) 0 INTERVENTIONS - Wound Cleansing / Measurement []  - Simple Wound Cleansing - one wound 0 X -  Complex Wound Cleansing - multiple wounds 2 5 X - Wound Imaging (photographs - any number of wounds) 1 5 []  - Wound Tracing (instead of  photographs) 0 []  - Simple Wound Measurement - one wound 0 X - Complex Wound Measurement - multiple wounds 2 5 INTERVENTIONS - Wound Dressings X - Small Wound Dressing one or multiple wounds 2 10 []  - Medium Wound Dressing one or multiple wounds 0 []  - Large Wound Dressing one or multiple wounds 0 []  - Application of Medications - topical 0 []  - Application of Medications - injection 0 INTERVENTIONS - Miscellaneous []  - External ear exam 0 [place], Rainey [name] ([id number]) []  - Specimen Collection (cultures, biopsies, blood, body fluids, etc.) 0 []  - Specimen(s) / Culture(s) sent or taken to Lab for analysis 0 X - Patient Transfer (multiple staff / Michiel Sites Lift / Similar devices) 1 10 []  - Simple Staple / Suture removal (25 or less) 0 []  - Complex Staple / Suture removal (26 or more) 0 []  - Hypo / Hyperglycemic Management (close monitor of Blood Glucose) 0 []  - Ankle / Brachial Index (ABI) - do not check if billed separately 0 X - Vital Signs 1 5 Has the patient been seen at the hospital within the last three years: Yes Total Score: 115 Level Of Care: New/Established - Level 3 Electronic Signature(s) Signed: 03/05/2017 4:50:02 PM By: Curtis Sites Entered By: Curtis Sites on 03/05/2017 13:35:27 Vara, Rainey [name] ([id number]) -------------------------------------------------------------------------------- Encounter Discharge Information Details Patient Name: [name] Date of Service: 03/05/2017 10:15 AM Medical Record Number: [id number] Patient Account Number: [id number] Date of Birth/Sex: 1933/07/29 (83 y.o. Male) Treating RN: Curtis Sites Primary Care Xai Frerking: Darreld Mclean Other Clinician: Referring Maryetta Shafer: Darreld Mclean Treating Lynnel Zanetti/Extender: Rudene Re in Treatment: 9 Encounter Discharge Information Items Discharge Pain Level:  0 Discharge Condition: Stable Ambulatory Status: Wheelchair Discharge Destination: Nursing Home Transportation: Private Auto Accompanied By: social worker Schedule Follow-up Appointment: Yes Medication Reconciliation completed and provided to Patient/Care No Tristan Proto: Provided on Clinical Summary of Care: 03/05/2017 Form Type Recipient Paper Patient LP Electronic Signature(s) Signed: 03/06/2017 8:49:50 AM By: Gwenlyn Perking Entered By: Gwenlyn Perking on 03/05/2017 10:46:13 Sippel, Rainey [name] ([id number]) -------------------------------------------------------------------------------- Lower Extremity Assessment Details Patient Name: [name] Date of Service: 03/05/2017 10:15 AM Medical Record Number: [id number] Patient Account Number: [id number] Date of Birth/Sex: 1933/12/27 (83 y.o. Male) Treating RN: Curtis Sites Primary Care Lebert Lovern: Darreld Mclean Other Clinician: Referring Kerstin Crusoe: Darreld Mclean Treating Vonette Grosso/Extender: Rudene Re in Treatment: 9 Vascular Assessment Pulses: Dorsalis Pedis Palpable: [Right:Yes] Posterior Tibial Extremity colors, hair growth, and conditions: Extremity Color: [Right:Hyperpigmented] Hair Growth on Extremity: [Right:No] Temperature of Extremity: [Right:Warm] Capillary Refill: [Right:< 3 seconds] Electronic Signature(s) Signed: 03/05/2017 4:50:02 PM By: Curtis Sites Entered By: Curtis Sites on 03/05/2017 10:27:48 Lanz, Rainey [name] ([id number]) -------------------------------------------------------------------------------- Multi Wound Chart Details Patient Name: [name] Date of Service: 03/05/2017 10:15 AM Medical Record Number: [id number] Patient Account Number: [id number] Date of Birth/Sex: 1933-07-23 (83 y.o. 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 N/A] Comorbid History: [1:Hypertension, Peripheral Hypertension, Peripheral N/A Venous Disease, Gout, Osteoarthritis] [2:Venous Disease, Gout, Osteoarthritis] Date Acquired: [1:12/04/2016] [2:12/04/2016] [N/A:N/A] Weeks of Treatment: [1:9] [2:9] [N/A:N/A] Wound Status: [1:Open] [2:Open] [N/A:N/A] Pending Amputation on Yes [2:Yes] [N/A:N/A] Presentation: Measurements L x W x D 0.2x0.6x0.1 [2:1.1x1.4x0.1] [N/A:N/A] (cm)  Area (cm) : [1:0.094] [2:1.21] [N/A:N/A] Volume (cm) : [1:0.009] [2:0.121] [N/A:N/A] % Reduction in Area: [1:98.40%] [2:76.30%] [N/A:N/A] % Reduction in Volume: 99.50% [2:88.10%] [N/A:N/A] Classification: [1:Full Thickness With Exposed Support Structures] [2:Full Thickness With Exposed Support Structures] [N/A:N/A] Exudate Amount: [1:Large] [2:Large] [N/A:N/A] Exudate Type: [1:Serous] [2:Serous] [N/A:N/A] Exudate Color: [1:amber] [2:amber] [N/A:N/A] Wound Margin: [1:Flat and Intact] [2:Flat and Intact] [N/A:N/A] Granulation Amount: [1:Large (67-100%)] [2:Large (67-100%)] [N/A:N/A] Granulation Quality: [1:Red] [2:Red] [N/A:N/A] Necrotic Amount: [1:None Present (0%)] [2:Small (1-33%)] [N/A:N/A] Exposed Structures: [1:Fat Layer (Subcutaneous Fat Layer (Subcutaneous N/A Tissue) Exposed: Yes] [2:Tissue) Exposed: Yes] Fascia: No Fascia: No Tendon: No Tendon: No Muscle: No Muscle: No Joint: No Joint: No Bone: No Bone: No Epithelialization: Large (67-100%) None N/A Periwound Skin Texture: Excoriation: No Excoriation: No N/A Induration:  No Induration: No Callus: No Callus: No Crepitus: No Crepitus: No Rash: No Rash: No Scarring: No Scarring: No Periwound Skin Maceration: No Maceration: No N/A Moisture: Dry/Scaly: No Dry/Scaly: No Periwound Skin Color: Atrophie Blanche: No Atrophie Blanche: No N/A Cyanosis: No Cyanosis: No Ecchymosis: No Ecchymosis: No Erythema: No Erythema: No Hemosiderin Staining: No Hemosiderin Staining: No Mottled: No Mottled: No Pallor: No Pallor: No Rubor: No Rubor: No Temperature: No Abnormality No Abnormality N/A Tenderness on Yes Yes N/A Palpation: Wound Preparation: Ulcer Cleansing: Ulcer Cleansing: N/A Rinsed/Irrigated with Rinsed/Irrigated with Saline Saline Topical Anesthetic Topical Anesthetic Applied: Other: lidocaine Applied: Other: lidocaine 4% 4% Treatment Notes Electronic Signature(s) Signed: 03/05/2017 4:28:32 PM By: Evlyn Kanner MD, FACS Entered By: Evlyn Kanner on 03/05/2017 10:49:22 Montella, Rainey [name] ([id number]) -------------------------------------------------------------------------------- Multi-Disciplinary Care Plan Details Patient Name: [name] Date of Service: 03/05/2017 10:15 AM Medical Record Number: [id number] Patient Account Number: [id number] Date of Birth/Sex: 10-Mar-1934 (83 y.o. Male) Treating RN: Curtis Sites Primary Care Raymondo Garcialopez: Darreld Mclean Other Clinician: Referring Lucretia Pendley: Darreld Mclean Treating Emalia Witkop/Extender: Rudene Re in Treatment: 9 Active Inactive ` Abuse / Safety / Falls / Self Care Management Nursing Diagnoses: Potential for falls Goals: Patient will not experience any injury related to falls Date Initiated: 12/30/2016 Target Resolution Date: 02/27/2017 Goal Status: Active Interventions: Assess fall risk on admission and as needed Notes: ` Orientation to the Wound Care Program Nursing Diagnoses: Knowledge deficit related to the wound healing center program Goals: Patient/caregiver will  verbalize understanding of the Wound Healing Center Program Date Initiated: 12/30/2016 Target Resolution Date: 02/27/2017 Goal Status: Active Interventions: Provide education on orientation to the wound center Notes: ` Wound/Skin Impairment Nursing Diagnoses: Impaired tissue integrity Stachnik, Rainey [name] ([id number]) Goals: Ulcer/skin breakdown will have a volume reduction of 30% by week 4 Date Initiated: 12/30/2016 Target Resolution Date: 02/27/2017 Goal Status: Active Ulcer/skin breakdown will have a volume reduction of 50% by week 8 Date Initiated: 12/30/2016 Target Resolution Date: 02/27/2017 Goal Status: Active Ulcer/skin breakdown will have a volume reduction of 80% by week 12 Date Initiated: 12/30/2016 Target Resolution Date: 02/27/2017 Goal Status: Active Ulcer/skin breakdown will heal within 14 weeks Date Initiated: 12/30/2016 Target Resolution Date: 02/27/2017 Goal Status: Active Interventions: Assess patient/caregiver ability to obtain necessary supplies Assess patient/caregiver ability to perform ulcer/skin care regimen upon admission and as needed Assess ulceration(s) every visit Notes: Electronic Signature(s) Signed: 03/05/2017 4:50:02 PM By: Curtis Sites Entered By: Curtis Sites on 03/05/2017 10:32:07 Ruesch, Rainey [name] ([id number]) -------------------------------------------------------------------------------- Pain Assessment Details Patient Name: [name] Date of Service: 03/05/2017 10:15 AM Medical Record Number: [id number] Patient Account Number: [id number] Date of Birth/Sex: [birth year] (83 y.o. Male) Treating RN: Curtis Sites Primary Care Lenell Lama: Darreld Mclean Other Clinician: Referring Delawrence Fridman: Darreld Mclean Treating  Tashiana Lamarca/Extender: Rudene Re in Treatment: 9 Active Problems Location of Pain Severity and Description of Pain Patient Has Paino No Site Locations Pain Management and Medication Current Pain Management: Notes Topical or  injectable lidocaine is offered to patient for acute pain when surgical debridement is performed. If needed, Patient is instructed to use over the counter pain medication for the following 24-48 hours after debridement. Wound care MDs do not prescribed pain medications. Patient has chronic pain or uncontrolled pain. Patient has been instructed to make an appointment with their Primary Care Physician for pain management. Electronic Signature(s) Signed: 03/05/2017 4:50:02 PM By: Curtis Sites Entered By: Curtis Sites on 03/05/2017 10:12:28 Salberg, Rainey [name] ([id number]) -------------------------------------------------------------------------------- Patient/Caregiver Education Details Patient Name: [name] Date of Service: 03/05/2017 10:15 AM Medical Record Number: [id number] Patient Account Number: [id number] Date of Birth/Gender: 09/15/1933 (83 y.o. Male) Treating RN: Curtis Sites Primary Care Physician: Darreld Mclean Other Clinician: Referring Physician: Darreld Mclean Treating Physician/Extender: Rudene Re in Treatment: 9 Education Assessment Education Provided To: Caregiver SNF nurse via written orders Education Topics Provided Wound/Skin Impairment: Handouts: Other: wound care orders Methods: Printed Electronic Signature(s) Signed: 03/05/2017 4:50:02 PM By: Curtis Sites Entered By: Curtis Sites on 03/05/2017 10:33:13 [place], Rainey [name] ([id number]) -------------------------------------------------------------------------------- Wound Assessment Details Patient Name: [name] Date of Service: 03/05/2017 10:15 AM Medical Record Number: [id number] Patient Account Number: [id number] Date of Birth/Sex: 12-21-1933 (83 y.o. Male) Treating RN: Curtis Sites Primary Care Lovelyn Sheeran: Darreld Mclean Other Clinician: Referring Cornell Bourbon: Darreld Mclean Treating Charmagne Buhl/Extender: Rudene Re in Treatment: 9 Wound Status Wound Number: 1 Primary Arterial  Insufficiency Ulcer Etiology: Wound Location: Right Ankle - Lateral Wound Open Wounding Event: Gradually Appeared Status: Date Acquired: 12/04/2016 Comorbid Hypertension, Peripheral Venous Weeks Of Treatment: 9 History: Disease, Gout, Osteoarthritis Clustered Wound: No Pending Amputation On Presentation Photos Photo Uploaded By: Curtis Sites on 03/05/2017 13:09:31 Wound Measurements Length: (cm) 0.2 Width: (cm) 0.6 Depth: (cm) 0.1 Area: (cm) 0.094 Volume: (cm) 0.009 % Reduction in Area: 98.4% % Reduction in Volume: 99.5% Epithelialization: Large (67-100%) Tunneling: No Undermining: No Wound Description Full Thickness With Exposed Foul Odor After Classification: Support Structures Slough/Fibrino Wound Margin: Flat and Intact Exudate Large Amount: Exudate Type: Serous Exudate Color: amber Cleansing: No Yes Wound Bed Granulation Amount: Large (67-100%) Exposed Structure Granulation Quality: Red Fascia Exposed: No Ziebell, Rainey [name] ([id number]) Necrotic Amount: None Present (0%) Fat Layer (Subcutaneous Tissue) Exposed: Yes Tendon Exposed: No Muscle Exposed: No Joint Exposed: No Bone Exposed: No Periwound Skin Texture Texture Color No Abnormalities Noted: No No Abnormalities Noted: No Callus: No Atrophie Blanche: No Crepitus: No Cyanosis: No Excoriation: No Ecchymosis: No Induration: No Erythema: No Rash: No Hemosiderin Staining: No Scarring: No Mottled: No Pallor: No Moisture Rubor: No No Abnormalities Noted: No Dry / Scaly: No Temperature / Pain Maceration: No Temperature: No Abnormality Tenderness on Palpation: Yes Wound Preparation Ulcer Cleansing: Rinsed/Irrigated with Saline Topical Anesthetic Applied: Other: lidocaine 4%, Treatment Notes Wound #1 (Right, Lateral Ankle) 1. Cleansed with: Clean wound with Normal Saline 2. Anesthetic Topical Lidocaine 4% cream to wound bed prior to debridement 4. Dressing Applied: Hydrafera Blue 5.  Secondary Dressing Applied Bordered Foam Dressing Notes netting Electronic Signature(s) Signed: 03/05/2017 4:50:02 PM By: Curtis Sites Entered By: Curtis Sites on 03/05/2017 10:27:08 Caughlin, Rainey [name] ([id number]) -------------------------------------------------------------------------------- Wound Assessment Details Patient Name: [name] Date of Service: 03/05/2017 10:15 AM Medical Record Number: [id number] Patient Account Number: [id number] Date of Birth/Sex: [birth year] (83 y.o. Male) Treating RN: Curtis Sites Primary Care Michae Grimley: Darreld Mclean Other Clinician:  Referring Steaven Wholey: Darreld McleanMILES, LINDA Treating Joshia Kitchings/Extender: Rudene ReBritto, Errol Weeks in Treatment: 9 Wound Status Wound Number: 2 Primary Arterial Insufficiency Ulcer Etiology: Wound Location: Right Malleolus Wound Open Wounding Event: Gradually Appeared Status: Date Acquired: 12/04/2016 Comorbid Hypertension, Peripheral Venous Weeks Of Treatment: 9 History: Disease, Gout, Osteoarthritis Clustered Wound: No Pending Amputation On Presentation Photos Photo Uploaded By: Curtis Sitesorthy, Joanna on 03/05/2017 13:09:32 Wound Measurements Length: (cm) 1.1 % Reduction in Width: (cm) 1.4 % Reduction in Depth: (cm) 0.1 Epithelializati Area: (cm) 1.21 Tunneling: Volume: (cm) 0.121 Undermining: Area: 76.3% Volume: 88.1% on: None No No Wound Description Full Thickness With Exposed Foul Odor After Classification: Support Structures Slough/Fibrino Wound Margin: Flat and Intact Exudate Large Amount: Exudate Type: Serous Exudate Color: amber Cleansing: No Yes Wound Bed Granulation Amount: Large (67-100%) Exposed Structure Granulation Quality: Red Fascia Exposed: No Heinzel, Rainey PinesLINNIES ([id number]) Necrotic Amount: Small (1-33%) Fat Layer (Subcutaneous Tissue) Exposed: Yes Necrotic Quality: Adherent Slough Tendon Exposed: No Muscle Exposed: No Joint Exposed: No Bone Exposed: No Periwound Skin Texture Texture  Color No Abnormalities Noted: No No Abnormalities Noted: No Callus: No Atrophie Blanche: No Crepitus: No Cyanosis: No Excoriation: No Ecchymosis: No Induration: No Erythema: No Rash: No Hemosiderin Staining: No Scarring: No Mottled: No Pallor: No Moisture Rubor: No No Abnormalities Noted: No Dry / Scaly: No Temperature / Pain Maceration: No Temperature: No Abnormality Tenderness on Palpation: Yes Wound Preparation Ulcer Cleansing: Rinsed/Irrigated with Saline Topical Anesthetic Applied: Other: lidocaine 4%, Treatment Notes Wound #2 (Right Malleolus) 1. Cleansed with: Clean wound with Normal Saline 2. Anesthetic Topical Lidocaine 4% cream to wound bed prior to debridement 4. Dressing Applied: Hydrafera Blue 5. Secondary Dressing Applied Bordered Foam Dressing Notes netting Electronic Signature(s) Signed: 03/05/2017 4:50:02 PM By: Curtis Sitesorthy, Joanna Entered By: Curtis Sitesorthy, Joanna on 03/05/2017 10:27:25 [place], Rainey PinesLINNIES ([id number]) -------------------------------------------------------------------------------- Vitals Details Patient Name: [name] [name], Kaine Date of Service: 03/05/2017 10:15 AM Medical Record Number: [id number] Patient Account Number: [id number] Date of Birth/Sex: 1934/01/31 33(83 y.o. Male) Treating RN: Curtis Sitesorthy, Joanna Primary Care Ilona Colley: Darreld McleanMILES, LINDA Other Clinician: Referring Isola Mehlman: Darreld McleanMILES, LINDA Treating Kallista Pae/Extender: Rudene ReBritto, Errol Weeks in Treatment: 9 Vital Signs Time Taken: 10:12 Temperature (F): 97.7 Pulse (bpm): 78 Respiratory Rate (breaths/min): 16 Blood Pressure (mmHg): 102/66 Reference Range: 80 - 120 mg / dl Electronic Signature(s) Signed: 03/05/2017 4:50:02 PM By: Curtis Sitesorthy, Joanna Entered By: Curtis Sitesorthy, Joanna on 03/05/2017 10:13:52

## 2017-03-07 NOTE — Progress Notes (Signed)
[place], [place] ([id number]) Visit Report for 03/05/2017 Chief Complaint Document Details Patient Name: [name], [name] Date of Service: 03/05/2017 10:15 AM Medical Record Number: [id number] Patient Account Number: [id number] Date of Birth/Sex: May 14, 1934 (83 y.o. Male) Treating RN: Montey Hora Primary Care Provider: Delight Stare Other Clinician: Referring Provider: Delight Stare Treating Provider/Extender: Frann Rider in Treatment: 9 Information Obtained from: Patient Chief Complaint 12/30/16; patient arrives from [place] unaccompanied. He has 3 open wounds on the right foot and 2 threatened areas Electronic Signature(s) Signed: 03/05/2017 4:28:32 PM By: Christin Fudge MD, FACS Entered By: Christin Fudge on 03/05/2017 10:49:33 Albert, [name] ([id number]) -------------------------------------------------------------------------------- HPI Details Patient Name: [name] Date of Service: 03/05/2017 10:15 AM Medical Record Number: [id number] Patient Account Number: [id number] Date of Birth/Sex: [birth year] (83 y.o. Male) Treating RN: Montey Hora Primary Care Provider: Delight Stare Other Clinician: Referring Provider: Delight Stare Treating Provider/Extender: Frann Rider in Treatment: 9 History of Present Illness HPI Description: 12/30/16; this is an 83 year old man who comes from [place] skilled facility. He comes today unaccompanied and without any history. Looking through [facility] link I am able to see that he was admitted to hospital from 5/25 through 12/04/16 at that point with dehydration, severe hyponatremia acute kidney injury. He was treated with IV fluid replacement including 3% normal saline. Nowhere in his discharge summary did I see any reference to wounds on his feet and is mentioned he does not come in with any history. He does come in with a pack of cigarettes therefore I'm assuming he is a smoker. He is not a listed diabetic.  There is some reference in his notes that he has a DSS Education officer, museum is his guardian I'm not completely sure if that is true in any case the patient has some degree of dementia although he is able to tell me that he is 83 years old came from [place]. He is not able to give a history of these wounds. Our intake nurse notes maggots in the right third toe. Beyond this he has an area over the right lateral malleolus on the right lateral foot. He also has to threatened areas on the base of the right fifth metatarsal and on the lateral aspect of the right fifth metatarsal head. We are completely unable to do ABIs in the clinic largely because when we inflated the cuff he seemed to withdraw the foot. 01/05/17; the patient was seen urgently by Dr. Delana Meyer of vein and vascular. He agreed with our assessment of critical limb ischemia and set him up for an angiogram. Unfortunately the patient was not transported for preoperative evaluation therefore the angiogram has been canceled. I am not exactly sure if and when this is going to be rescheduled. The patient's wounds look about the same, right lateral ankle right lateral foot third toe. There is also concerning areas over the fifth met head and base of the fifth metatarsal which are not open but are covered by a dark eschar logorrhea and painful. We have been using silver alginate and x-ray of the right foot done at the facility showed no definite radiographic evidence of acute fracture or dislocation there was an old avulsion fracture of the proximal phalanx fifth digit as well as old fractures of the base of the proximal phalanx of the fifth digit. There is no comment about osteomyelitis 01/16/17 I have patient's record from his operative report from [facility] with Dr. Delana Meyer were fortunately patient underwent a successful angioplasty in regard to  his right lower extremity. The anterior tibial in-line flow was improved with less than  5% residual stenosis. Angioplasty of the SFA and popliteal arteries also yielded excellent results in post procedure testing with less than 10% residual stenosis. Overall patient appears to have done very well with this procedure. Hopefully this will translate into better wound healing in regard to his right lower extremity wounds. 01/22/2017 -- the patient comes with no dressings appropriate on his right lower extremity and the patient is rather non-verbal and coherent. He has no nursing or caregiver 02/12/17 on evaluation today patient appears to be doing a little better in regard to his wounds and the Danis, LUISMANUEL ([id number]) dressings were in place at all for wound locations as they should be. With that being said he has some discomfort noted with cleansing of the wounds. No fevers, chills, nausea, or vomiting noted at this time. 02/19/17 on evaluation today patient appears to be doing well in regard to his wounds. All are measuring a little bit smaller at this time. He has been tolerating the dressing changes without complication. He does appear to be quite a bit drowsy today when questioned he tells me that he didn't sleep well last night due to arthritis. Otherwise he does not describe any pain at this time although she really did not talk much. No fevers, chills, nausea, or vomiting noted at this time. 02/26/2017 -- we have been having problems having the patient come here without any caregivers and today his caseworker was here and we had a detailed discussion with her regarding his treatment plan and the lack of coordinated care at his facility. She will look into this. Electronic Signature(s) Signed: 03/05/2017 4:28:32 PM By: Christin Fudge MD, FACS Entered By: Christin Fudge on 03/05/2017 10:49:38 Uplinger, [name] ([id number]) -------------------------------------------------------------------------------- Physical Exam Details Patient Name: [name] Date of Service: 03/05/2017  10:15 AM Medical Record Number: [id number] Patient Account Number: [id number] Date of Birth/Sex: 06/10/1934 (83 y.o. Male) Treating RN: Montey Hora Primary Care Provider: Delight Stare Other Clinician: Referring Provider: Delight Stare Treating Provider/Extender: Frann Rider in Treatment: 9 Constitutional . Pulse regular. Respirations normal and unlabored. Afebrile. . Eyes Nonicteric. Reactive to light. Ears, Nose, Mouth, and Throat Lips, teeth, and gums WNL.Marland Kitchen Moist mucosa without lesions. Neck supple and nontender. No palpable supraclavicular or cervical adenopathy. Normal sized without goiter. Respiratory WNL. No retractions.. Cardiovascular Pedal Pulses WNL. No clubbing, cyanosis or edema. Lymphatic No adneopathy. No adenopathy. No adenopathy. Musculoskeletal Adexa without tenderness or enlargement.. Digits and nails w/o clubbing, cyanosis, infection, petechiae, ischemia, or inflammatory conditions.. Integumentary (Hair, Skin) No suspicious lesions. No crepitus or fluctuance. No peri-wound warmth or erythema. No masses.Marland Kitchen Psychiatric Judgement and insight Intact.. No evidence of depression, anxiety, or agitation.. Notes no sharp debridement was required today and the wound is looking clean with some hyper granulation tissue. Electronic Signature(s) Signed: 03/05/2017 4:28:32 PM By: Christin Fudge MD, FACS Entered By: Christin Fudge on 03/05/2017 10:50:01 Kutch, [name] ([id number]) -------------------------------------------------------------------------------- Physician Orders Details Patient Name: [name] Date of Service: 03/05/2017 10:15 AM Medical Record Number: [id number] Patient Account Number: [id number] Date of Birth/Sex: [birth year] (83 y.o. Male) Treating RN: Montey Hora Primary Care Provider: Delight Stare Other Clinician: Referring Provider: Delight Stare Treating Provider/Extender: Frann Rider in Treatment: 9 Verbal / Phone Orders:  No Diagnosis Coding Wound Cleansing Wound #1 Right,Lateral Ankle o Clean wound with Normal Saline. o May Shower, gently pat wound dry prior to applying new dressing. Wound #2 Right Malleolus o Clean wound  with Normal Saline. o May Shower, gently pat wound dry prior to applying new dressing. Anesthetic Wound #1 Right,Lateral Ankle o Topical Lidocaine 4% cream applied to wound bed prior to debridement - In clinic only Wound #2 Right Malleolus o Topical Lidocaine 4% cream applied to wound bed prior to debridement - In clinic only Primary Wound Dressing Wound #1 Right,Lateral Ankle o Hydrafera Blue Wound #2 Right Malleolus o Hydrafera Blue Secondary Dressing Wound #1 Right,Lateral Ankle o Dry Gauze o Boardered Foam Dressing Wound #2 Right Malleolus o Dry Gauze o Boardered Foam Dressing Dressing Change Frequency Wound #1 Right,Lateral Ankle o Change dressing every day. Yager, [name] ([id number]) Wound #2 Right Malleolus o Change dressing every day. Follow-up Appointments Wound #1 Right,Lateral Ankle o Return Appointment in 1 week. Wound #2 Right Malleolus o Return Appointment in 1 week. Additional Orders / Instructions Wound #1 Right,Lateral Ankle o Stop Smoking o Increase protein intake. Wound #2 Right Malleolus o Stop Smoking o Increase protein intake. Electronic Signature(s) Signed: 03/05/2017 4:28:32 PM By: Christin Fudge MD, FACS Signed: 03/05/2017 4:50:02 PM By: Montey Hora Entered By: Montey Hora on 03/05/2017 10:34:11 Crance, [name] ([id number]) -------------------------------------------------------------------------------- Problem List Details Patient Name: [name] Date of Service: 03/05/2017 10:15 AM Medical Record Number: [id number] Patient Account Number: [id number] Date of Birth/Sex: 07-07-1934 (83 y.o. 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.513 Non-pressure chronic ulcer of other part of right foot with 12/30/2016 Yes necrosis of muscle I70.235 Atherosclerosis of native arteries of right leg with 12/30/2016 Yes ulceration of other part of foot I70.245 Atherosclerosis of native arteries of left leg with ulceration 12/30/2016 Yes of other part of foot F17.218 Nicotine dependence, cigarettes, with other nicotine- 01/22/2017 Yes induced disorders Inactive Problems Resolved Problems Electronic Signature(s) Signed: 03/05/2017 4:28:32 PM By: Christin Fudge MD, FACS Entered By: Christin Fudge on 03/05/2017 10:49:18 Gadbois, [name] ([id number]) -------------------------------------------------------------------------------- Progress Note Details Patient Name: [name] Date of Service: 03/05/2017 10:15 AM Medical Record Number: [id number] Patient Account Number: [id number] Date of Birth/Sex: [birth year] (83 y.o. Male) Treating RN: Montey Hora Primary Care Provider: Delight Stare Other Clinician: Referring Provider: Delight Stare Treating Provider/Extender: Frann Rider in Treatment: 9 Subjective Chief Complaint Information obtained from Patient 12/30/16; patient arrives from [place] unaccompanied. He has 3 open wounds on the right foot and 2 threatened areas History of Present Illness (HPI) 12/30/16; this is an 83 year old man who comes from [place] skilled facility. He comes today unaccompanied and without any history. Looking through [facility] link I am able to see that he was admitted to hospital from 5/25 through 12/04/16 at that point  with dehydration, severe hyponatremia acute kidney injury. He was treated with IV fluid replacement including 3% normal saline. Nowhere in his discharge summary did I see any reference to wounds on his feet and is mentioned he does not come in with any history. He does come in with a pack of cigarettes therefore I'm assuming he is a smoker. He is not a listed diabetic. There is some reference in his notes that he has a DSS Education officer, museum is his guardian I'm not completely sure if that is true in any case the patient  has some degree of dementia although he is able to tell me that he is 83 years old came from [facility]. He is not able to give a history of these wounds. Our intake nurse notes maggots in the right third toe. Beyond this he has an area over the right lateral malleolus on the right lateral foot. He also has to threatened areas on the base of the right fifth metatarsal and on the lateral aspect of the right fifth metatarsal head. We are completely unable to do ABIs in the clinic largely because when we inflated the cuff he seemed to withdraw the foot. 01/05/17; the patient was seen urgently by Dr. Delana Meyer of vein and vascular. He agreed with our assessment of critical limb ischemia and set him up for an angiogram. Unfortunately the patient was not transported for preoperative evaluation therefore the angiogram has been canceled. I am not exactly sure if and when this is going to be rescheduled. The patient's wounds look about the same, right lateral ankle right lateral foot third toe. There is also concerning areas over the fifth met head and base of the fifth metatarsal which are not open but are covered by a dark eschar logorrhea and painful. We have been using silver alginate and x-ray of the right foot done at the facility showed no definite radiographic evidence of acute fracture or dislocation there was an old avulsion fracture of the proximal phalanx fifth digit as well as old  fractures of the base of the proximal phalanx of the fifth digit. There is no comment about osteomyelitis 01/16/17 I have patient's record from his operative report from [facility] with Dr. Delana Meyer were fortunately patient underwent a successful angioplasty in regard to his right lower extremity. The anterior tibial in-line flow was improved with less than 5% residual stenosis. Angioplasty of the SFA and popliteal arteries also yielded excellent results in post procedure testing with less than 10% residual Hinsch, [name] ([id number]) stenosis. Overall patient appears to have done very well with this procedure. Hopefully this will translate into better wound healing in regard to his right lower extremity wounds. 01/22/2017 -- the patient comes with no dressings appropriate on his right lower extremity and the patient is rather non-verbal and coherent. He has no nursing or caregiver 02/12/17 on evaluation today patient appears to be doing a little better in regard to his wounds and the dressings were in place at all for wound locations as they should be. With that being said he has some discomfort noted with cleansing of the wounds. No fevers, chills, nausea, or vomiting noted at this time. 02/19/17 on evaluation today patient appears to be doing well in regard to his wounds. All are measuring a little bit smaller at this time. He has been tolerating the dressing changes without complication. He does appear to be quite a bit drowsy today when questioned he tells me that he didn't sleep well last night due to arthritis. Otherwise he does not describe any pain at this time although she really did not talk much. No fevers, chills, nausea, or vomiting noted at this time. 02/26/2017 -- we have been having problems having the patient come here without any caregivers and today his caseworker was here and we had a detailed discussion with her regarding his treatment plan and the lack of  coordinated care at his facility. She will look into this. Objective Constitutional Pulse regular. Respirations normal and unlabored. Afebrile. Vitals Time Taken: 10:12 AM, Temperature: 97.7 F,  Pulse: 78 bpm, Respiratory Rate: 16 breaths/min, Blood Pressure: 102/66 mmHg. Eyes Nonicteric. Reactive to light. Ears, Nose, Mouth, and Throat Lips, teeth, and gums WNL.Marland Kitchen Moist mucosa without lesions. Neck supple and nontender. No palpable supraclavicular or cervical adenopathy. Normal sized without goiter. Respiratory WNL. No retractions.. Cardiovascular Kalish, [name] ([id number]) Pedal Pulses WNL. No clubbing, cyanosis or edema. Lymphatic No adneopathy. No adenopathy. No adenopathy. Musculoskeletal Adexa without tenderness or enlargement.. Digits and nails w/o clubbing, cyanosis, infection, petechiae, ischemia, or inflammatory conditions.Marland Kitchen Psychiatric Judgement and insight Intact.. No evidence of depression, anxiety, or agitation.. General Notes: no sharp debridement was required today and the wound is looking clean with some hyper granulation tissue. Integumentary (Hair, Skin) No suspicious lesions. No crepitus or fluctuance. No peri-wound warmth or erythema. No masses.. Wound #1 status is Open. Original cause of wound was Gradually Appeared. The wound is located on the Right,Lateral Ankle. The wound measures 0.2cm length x 0.6cm width x 0.1cm depth; 0.094cm^2 area and 0.009cm^3 volume. There is Fat Layer (Subcutaneous Tissue) Exposed exposed. There is no tunneling or undermining noted. There is a large amount of serous drainage noted. The wound margin is flat and intact. There is large (67-100%) red granulation within the wound bed. There is no necrotic tissue within the wound bed. The periwound skin appearance did not exhibit: Callus, Crepitus, Excoriation, Induration, Rash, Scarring, Dry/Scaly, Maceration, Atrophie Blanche, Cyanosis, Ecchymosis, Hemosiderin Staining,  Mottled, Pallor, Rubor, Erythema. Periwound temperature was noted as No Abnormality. The periwound has tenderness on palpation. Wound #2 status is Open. Original cause of wound was Gradually Appeared. The wound is located on the Right Malleolus. The wound measures 1.1cm length x 1.4cm width x 0.1cm depth; 1.21cm^2 area and 0.121cm^3 volume. There is Fat Layer (Subcutaneous Tissue) Exposed exposed. There is no tunneling or undermining noted. There is a large amount of serous drainage noted. The wound margin is flat and intact. There is large (67-100%) red granulation within the wound bed. There is a small (1-33%) amount of necrotic tissue within the wound bed including Adherent Slough. The periwound skin appearance did not exhibit: Callus, Crepitus, Excoriation, Induration, Rash, Scarring, Dry/Scaly, Maceration, Atrophie Blanche, Cyanosis, Ecchymosis, Hemosiderin Staining, Mottled, Pallor, Rubor, Erythema. Periwound temperature was noted as No Abnormality. The periwound has tenderness on palpation. 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 May Shower, gently pat wound dry prior to applying new dressing. Wound #2 Right Malleolus: Clean wound with Normal Saline. May Shower, gently pat wound dry prior to applying new dressing. Anesthetic: Wound #1 Right,Lateral  Ankle: Topical Lidocaine 4% cream applied to wound bed prior to debridement - In clinic only Wound #2 Right Malleolus: Topical Lidocaine 4% cream applied to wound bed prior to debridement - In clinic only Primary Wound Dressing: Wound #1 Right,Lateral Ankle: Hydrafera Blue Wound #2 Right Malleolus: Hydrafera Blue Secondary Dressing: Wound #1 Right,Lateral Ankle: Dry Gauze Boardered Foam Dressing Wound #2 Right Malleolus: Dry Gauze Boardered Foam Dressing Dressing Change  Frequency: Wound #1 Right,Lateral Ankle: Change dressing every day. Wound #2 Right Malleolus: Change dressing every day. Follow-up Appointments: Wound #1 Right,Lateral Ankle: Return Appointment in 1 week. Wound #2 Right Malleolus: Return Appointment in 1 week. Additional Orders / Instructions: Wound #1 Right,Lateral Ankle: Stop Smoking Increase protein intake. Wound #2 Right Malleolus: Colantuono, [name] ([id number]) Stop Smoking Increase protein intake. After review of the patient's wounds today, I have recommended: 1. we use silver collagen over all these areas and offload them appropriately. 2. discusses care in detail with his caregiver who is at the bedside today we will also communicate with the nursing staff at the facility to make sure they do appropriate dressing changes Electronic Signature(s) Signed: 03/05/2017 4:28:32 PM By: Christin Fudge MD, FACS Entered By: Christin Fudge on 03/05/2017 10:50:59 Halbig, [name] ([id number]) -------------------------------------------------------------------------------- SuperBill Details Patient Name: [name] Date of Service: 03/05/2017 Medical Record Number: [id number] Patient Account Number: [id number] Date of Birth/Sex: [birth year] (83 y.o. 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 other part of right foot with necrosis of muscle 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 F17.218 Nicotine dependence, cigarettes, with other nicotine-induced disorders Facility Procedures CPT4 Code: 18590931 Description: 99213 - WOUND CARE VISIT-LEV 3 EST PT Modifier: Quantity: 1 Physician Procedures CPT4: Description Modifier Quantity Code [id number] 99213 - WC PHYS LEVEL 3 - EST PT 1 ICD-10 Description Diagnosis 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

## 2017-03-12 ENCOUNTER — Encounter: Payer: Medicare HMO | Attending: Surgery | Admitting: Surgery

## 2017-03-12 DIAGNOSIS — I10 Essential (primary) hypertension: Secondary | ICD-10-CM | POA: Diagnosis not present

## 2017-03-12 DIAGNOSIS — I70235 Atherosclerosis of native arteries of right leg with ulceration of other part of foot: Secondary | ICD-10-CM | POA: Diagnosis not present

## 2017-03-12 DIAGNOSIS — M1990 Unspecified osteoarthritis, unspecified site: Secondary | ICD-10-CM | POA: Diagnosis not present

## 2017-03-12 DIAGNOSIS — M109 Gout, unspecified: Secondary | ICD-10-CM | POA: Diagnosis not present

## 2017-03-12 DIAGNOSIS — I70245 Atherosclerosis of native arteries of left leg with ulceration of other part of foot: Secondary | ICD-10-CM | POA: Diagnosis not present

## 2017-03-12 DIAGNOSIS — L97513 Non-pressure chronic ulcer of other part of right foot with necrosis of muscle: Secondary | ICD-10-CM | POA: Diagnosis not present

## 2017-03-12 DIAGNOSIS — L97311 Non-pressure chronic ulcer of right ankle limited to breakdown of skin: Secondary | ICD-10-CM | POA: Diagnosis not present

## 2017-03-12 DIAGNOSIS — F17218 Nicotine dependence, cigarettes, with other nicotine-induced disorders: Secondary | ICD-10-CM | POA: Diagnosis not present

## 2017-03-14 NOTE — Progress Notes (Signed)
[name] ([id number]) Visit Report for 03/12/2017 Chief Complaint Document Details Patient Name: [name], [name] Date of Service: 03/12/2017 10:15 AM Medical Record Number: [id number] Patient Account Number: [id number] Date of Birth/Sex: 04-21-1934 (83 y.o. Male) Treating RN: Montey Hora Primary Care Provider: Delight Stare Other Clinician: Referring Provider: Delight Stare Treating Provider/Extender: Frann Rider in Treatment: 10 Information Obtained from: Patient Chief Complaint 12/30/16; patient arrives from [facility] unaccompanied. He has 3 open wounds on the right foot and 2 threatened areas Electronic Signature(s) Signed: 03/12/2017 12:50:15 PM By: Christin Fudge MD, FACS Entered By: Christin Fudge on 03/12/2017 10:42:44 [name] ([id number]) -------------------------------------------------------------------------------- HPI Details Patient Name: [name] Date of Service: 03/12/2017 10:15 AM Medical Record Number: [id number] Patient Account Number: [id number] Date of Birth/Sex: 08-04-1933 (83 y.o. Male) Treating RN: Montey Hora Primary Care Provider: Delight Stare Other Clinician: Referring Provider: Delight Stare Treating Provider/Extender: Frann Rider in Treatment: 10 History of Present Illness HPI Description: 12/30/16; this is an 83 year old man who comes from [place] skilled facility. He comes today unaccompanied and without any history. Looking through [facility] link I am able to see that he was admitted to hospital from 5/25 through 12/04/16 at that point with dehydration, severe hyponatremia acute kidney injury. He was treated with IV fluid replacement including 3% normal saline. Nowhere in his discharge summary did I see any reference to wounds on his feet and is mentioned he does not come in with any history. He does come in with a pack of cigarettes therefore I'm assuming he is a smoker. He is not a listed diabetic.  There is some reference in his notes that he has a DSS Education officer, museum is his guardian I'm not completely sure if that is true in any case the patient has some degree of dementia although he is able to tell me that he is 83 years old came from [place]. He is not able to give a history of these wounds. Our intake nurse notes maggots in the right third toe. Beyond this he has an area over the right lateral malleolus on the right lateral foot. He also has to threatened areas on the base of the right fifth metatarsal and on the lateral aspect of the right fifth metatarsal head. We are completely unable to do ABIs in the clinic largely because when we inflated the cuff he seemed to withdraw the foot. 01/05/17; the patient was seen urgently by Dr. Delana Meyer of vein and vascular. He agreed with our assessment of critical limb ischemia and set him up for an angiogram. Unfortunately the patient was not transported for preoperative evaluation therefore the angiogram has been canceled. I am not exactly sure if and when this is going to be rescheduled. The patient's wounds look about the same, right lateral ankle right lateral foot third toe. There is also concerning areas over the fifth met head and base of the fifth metatarsal which are not open but are covered by a dark eschar logorrhea and painful. We have been using silver alginate and x-ray of the right foot done at the facility showed no definite radiographic evidence of acute fracture or dislocation there was an old avulsion fracture of the proximal phalanx fifth digit as well as old fractures of the base of the proximal phalanx of the fifth digit. There is no comment about osteomyelitis 01/16/17 I have patient's record from his operative report from [facility] with Dr. Delana Meyer were fortunately patient underwent a successful angioplasty in regard to  his right lower extremity. The anterior tibial in-line flow was improved with less than  5% residual stenosis. Angioplasty of the SFA and popliteal arteries also yielded excellent results in post procedure testing with less than 10% residual stenosis. Overall patient appears to have done very well with this procedure. Hopefully this will translate into better wound healing in regard to his right lower extremity wounds. 01/22/2017 -- the patient comes with no dressings appropriate on his right lower extremity and the patient is rather non-verbal and coherent. He has no nursing or caregiver 02/12/17 on evaluation today patient appears to be doing a little better in regard to his wounds and the [name] ([id number]) dressings were in place at all for wound locations as they should be. With that being said he has some discomfort noted with cleansing of the wounds. No fevers, chills, nausea, or vomiting noted at this time. 02/19/17 on evaluation today patient appears to be doing well in regard to his wounds. All are measuring a little bit smaller at this time. He has been tolerating the dressing changes without complication. He does appear to be quite a bit drowsy today when questioned he tells me that he didn't sleep well last night due to arthritis. Otherwise he does not describe any pain at this time although she really did not talk much. No fevers, chills, nausea, or vomiting noted at this time. 02/26/2017 -- we have been having problems having the patient come here without any caregivers and today his caseworker was here and we had a detailed discussion with her regarding his treatment plan and the lack of coordinated care at his facility. She will look into this. Electronic Signature(s) Signed: 03/12/2017 12:50:15 PM By: Christin Fudge MD, FACS Entered By: Christin Fudge on 03/12/2017 10:42:52 [name] ([id number]) -------------------------------------------------------------------------------- Physical Exam Details Patient Name: [name] Date of Service: 03/12/2017  10:15 AM Medical Record Number: [id number] Patient Account Number: [id number] Date of Birth/Sex: 1933/10/25 (83 y.o. Male) Treating RN: Montey Hora Primary Care Provider: Delight Stare Other Clinician: Referring Provider: Delight Stare Treating Provider/Extender: Frann Rider in Treatment: 10 Constitutional . Pulse regular. Respirations normal and unlabored. Afebrile. . Eyes Nonicteric. Reactive to light. Ears, Nose, Mouth, and Throat Lips, teeth, and gums WNL.Marland Kitchen Moist mucosa without lesions. Neck supple and nontender. No palpable supraclavicular or cervical adenopathy. Normal sized without goiter. Respiratory WNL. No retractions.. Cardiovascular Pedal Pulses WNL. No clubbing, cyanosis or edema. Lymphatic No adneopathy. No adenopathy. No adenopathy. Musculoskeletal Adexa without tenderness or enlargement.. Digits and nails w/o clubbing, cyanosis, infection, petechiae, ischemia, or inflammatory conditions.. Integumentary (Hair, Skin) No suspicious lesions. No crepitus or fluctuance. No peri-wound warmth or erythema. No masses.Marland Kitchen Psychiatric Judgement and insight Intact.. No evidence of depression, anxiety, or agitation.. Notes the wounds were washed out with moist saline gauze and the hyper granulation tissue is looking much better and no sharp debridement was required today Electronic Signature(s) Signed: 03/12/2017 12:50:15 PM By: Christin Fudge MD, FACS Entered By: Christin Fudge on 03/12/2017 10:43:17 Schloss, Wynn [name] ([id number]) -------------------------------------------------------------------------------- Physician Orders Details Patient Name: [name] Date of Service: 03/12/2017 10:15 AM Medical Record Number: [id number] Patient Account Number: [id number] Date of Birth/Sex: 10-21-1933 (83 y.o. Male) Treating RN: Montey Hora Primary Care Provider: Delight Stare Other Clinician: Referring Provider: Delight Stare Treating Provider/Extender: Frann Rider in  Treatment: 10 Verbal / Phone Orders: No Diagnosis Coding Wound Cleansing Wound #1 Right,Lateral Ankle o Clean wound with Normal Saline. o May Shower, gently pat wound dry prior to applying new  dressing. Wound #2 Right Malleolus o Clean wound with Normal Saline. o May Shower, gently pat wound dry prior to applying new dressing. Anesthetic Wound #1 Right,Lateral Ankle o Topical Lidocaine 4% cream applied to wound bed prior to debridement - In clinic only Wound #2 Right Malleolus o Topical Lidocaine 4% cream applied to wound bed prior to debridement - In clinic only Primary Wound Dressing Wound #1 Right,Lateral Ankle o Hydrafera Blue Wound #2 Right Malleolus o Hydrafera Blue Secondary Dressing Wound #1 Right,Lateral Ankle o Dry Gauze o Boardered Foam Dressing Wound #2 Right Malleolus o Dry Gauze o Boardered Foam Dressing Dressing Change Frequency Wound #1 Right,Lateral Ankle o Change dressing every day. Luu, Wynn [name] ([id number]) Wound #2 Right Malleolus o Change dressing every day. Follow-up Appointments Wound #1 Right,Lateral Ankle o Return Appointment in 1 week. Wound #2 Right Malleolus o Return Appointment in 1 week. Additional Orders / Instructions Wound #1 Right,Lateral Ankle o Stop Smoking o Increase protein intake. Wound #2 Right Malleolus o Stop Smoking o Increase protein intake. Notes Dressing had not been changed since 03/05/2017. Please change more often Electronic Signature(s) Signed: 03/12/2017 12:50:15 PM By: Christin Fudge MD, FACS Signed: 03/12/2017 4:41:02 PM By: Montey Hora Entered By: Montey Hora on 03/12/2017 10:40:52 [place], Wynn [name] ([id number]) -------------------------------------------------------------------------------- Problem List Details Patient Name: Cropper, Wynn [name] Date of Service: 03/12/2017 10:15 AM Medical Record Number: [id number] Patient Account Number: [id number] Date of Birth/Sex: [birth year]  (83 y.o. 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.513 Non-pressure chronic ulcer of other part of right foot with 12/30/2016 Yes necrosis of muscle I70.235 Atherosclerosis of native arteries of right leg with 12/30/2016 Yes ulceration of other part of foot I70.245 Atherosclerosis of native arteries of left leg with ulceration 12/30/2016 Yes of other part of foot F17.218 Nicotine dependence, cigarettes, with other nicotine- 01/22/2017 Yes induced disorders Inactive Problems Resolved Problems Electronic Signature(s) Signed: 03/12/2017 12:50:15 PM By: Christin Fudge MD, FACS Entered By: Christin Fudge on 03/12/2017 10:42:31 Raneri, Wynn [name] ([id number]) -------------------------------------------------------------------------------- Progress Note Details Patient Name: [name] Date of Service: 03/12/2017 10:15 AM Medical Record Number: [id number] Patient Account Number: [id number] Date of Birth/Sex: 1933/07/21 (83 y.o. Male) Treating RN: Montey Hora Primary Care Provider: Delight Stare Other Clinician: Referring Provider: Delight Stare Treating Provider/Extender: Frann Rider in Treatment: 10 Subjective Chief Complaint Information obtained from Patient 12/30/16; patient arrives from [place] unaccompanied. He has 3 open wounds on the right foot and 2 threatened areas History of Present Illness (HPI) 12/30/16; this is an 83 year old man who comes from [place] skilled facility. He comes today unaccompanied and without any history. Looking through  [facility] link I am able to see that he was admitted to hospital from 5/25 through 12/04/16 at that point with dehydration, severe hyponatremia acute kidney injury. He was treated with IV fluid replacement including 3% normal saline. Nowhere in his discharge summary did I see any reference to wounds on his feet and is mentioned he does not come in with any history. He does come in with a pack of cigarettes therefore I'm assuming he is a smoker. He is not a listed diabetic. There is some reference in his notes that he has  a DSS social worker is his guardian I'm not completely sure if that is true in any case the patient has some degree of dementia although he is able to tell me that he is 83 years old came from [place]. He is not able to give a history of these wounds. Our intake nurse notes maggots in the right third toe. Beyond this he has an area over the right lateral malleolus on the right lateral foot. He also has to threatened areas on the base of the right fifth metatarsal and on the lateral aspect of the right fifth metatarsal head. We are completely unable to do ABIs in the clinic largely because when we inflated the cuff he seemed to withdraw the foot. 01/05/17; the patient was seen urgently by Dr. Delana Meyer of vein and vascular. He agreed with our assessment of critical limb ischemia and set him up for an angiogram. Unfortunately the patient was not transported for preoperative evaluation therefore the angiogram has been canceled. I am not exactly sure if and when this is going to be rescheduled. The patient's wounds look about the same, right lateral ankle right lateral foot third toe. There is also concerning areas over the fifth met head and base of the fifth metatarsal which are not open but are covered by a dark eschar logorrhea and painful. We have been using silver alginate and x-ray of the right foot done at the facility showed no definite radiographic evidence of acute  fracture or dislocation there was an old avulsion fracture of the proximal phalanx fifth digit as well as old fractures of the base of the proximal phalanx of the fifth digit. There is no comment about osteomyelitis 01/16/17 I have patient's record from his operative report from [facility] with Dr. Delana Meyer were fortunately patient underwent a successful angioplasty in regard to his right lower extremity. The anterior tibial in-line flow was improved with less than 5% residual stenosis. Angioplasty of the SFA and popliteal arteries also yielded excellent results in post procedure testing with less than 10% residual Eaglin, Wynn [name] ([id number]) stenosis. Overall patient appears to have done very well with this procedure. Hopefully this will translate into better wound healing in regard to his right lower extremity wounds. 01/22/2017 -- the patient comes with no dressings appropriate on his right lower extremity and the patient is rather non-verbal and coherent. He has no nursing or caregiver 02/12/17 on evaluation today patient appears to be doing a little better in regard to his wounds and the dressings were in place at all for wound locations as they should be. With that being said he has some discomfort noted with cleansing of the wounds. No fevers, chills, nausea, or vomiting noted at this time. 02/19/17 on evaluation today patient appears to be doing well in regard to his wounds. All are measuring a little bit smaller at this time. He has been tolerating the dressing changes without complication. He does appear to be quite a bit drowsy today when questioned he tells me that he didn't sleep well last night due to arthritis. Otherwise he does not describe any pain at this time although she really did not talk much. No fevers, chills, nausea, or vomiting noted at this time. 02/26/2017 -- we have been having problems having the patient come here without any caregivers and today his  caseworker was here and we had a detailed discussion with her regarding his treatment plan and the lack of coordinated care at his facility. She will  look into this. Objective Constitutional Pulse regular. Respirations normal and unlabored. Afebrile. Vitals Time Taken: 10:15 AM, Temperature: 97.7 F, Pulse: 90 bpm, Respiratory Rate: 16 breaths/min, Blood Pressure: 91/56 mmHg. Eyes Nonicteric. Reactive to light. Ears, Nose, Mouth, and Throat Lips, teeth, and gums WNL.Marland Kitchen Moist mucosa without lesions. Neck supple and nontender. No palpable supraclavicular or cervical adenopathy. Normal sized without goiter. Respiratory WNL. No retractions.. Cardiovascular Kolenovic, [name] ([id number]) Pedal Pulses WNL. No clubbing, cyanosis or edema. Lymphatic No adneopathy. No adenopathy. No adenopathy. Musculoskeletal Adexa without tenderness or enlargement.. Digits and nails w/o clubbing, cyanosis, infection, petechiae, ischemia, or inflammatory conditions.Marland Kitchen Psychiatric Judgement and insight Intact.. No evidence of depression, anxiety, or agitation.. General Notes: the wounds were washed out with moist saline gauze and the hyper granulation tissue is looking much better and no sharp debridement was required today Integumentary (Hair, Skin) No suspicious lesions. No crepitus or fluctuance. No peri-wound warmth or erythema. No masses.. Wound #1 status is Open. Original cause of wound was Gradually Appeared. The wound is located on the Right,Lateral Ankle. The wound measures 0.1cm length x 0.1cm width x 0.1cm depth; 0.008cm^2 area and 0.001cm^3 volume. There is Fat Layer (Subcutaneous Tissue) Exposed exposed. There is no tunneling or undermining noted. There is a large amount of serous drainage noted. The wound margin is flat and intact. There is large (67-100%) red granulation within the wound bed. There is no necrotic tissue within the wound bed. The periwound skin appearance did not exhibit: Callus,  Crepitus, Excoriation, Induration, Rash, Scarring, Dry/Scaly, Maceration, Atrophie Blanche, Cyanosis, Ecchymosis, Hemosiderin Staining, Mottled, Pallor, Rubor, Erythema. Periwound temperature was noted as No Abnormality. The periwound has tenderness on palpation. Wound #2 status is Open. Original cause of wound was Gradually Appeared. The wound is located on the Right Malleolus. The wound measures 0.8cm length x 0.8cm width x 0.1cm depth; 0.503cm^2 area and 0.05cm^3 volume. There is Fat Layer (Subcutaneous Tissue) Exposed exposed. There is no tunneling or undermining noted. There is a large amount of serous drainage noted. The wound margin is flat and intact. There is large (67-100%) red granulation within the wound bed. There is a small (1-33%) amount of necrotic tissue within the wound bed including Adherent Slough. The periwound skin appearance did not exhibit: Callus, Crepitus, Excoriation, Induration, Rash, Scarring, Dry/Scaly, Maceration, Atrophie Blanche, Cyanosis, Ecchymosis, Hemosiderin Staining, Mottled, Pallor, Rubor, Erythema. Periwound temperature was noted as No Abnormality. The periwound has tenderness on palpation. 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 May Shower, gently pat wound dry prior to applying new dressing. Wound #2 Right  Malleolus: Clean wound with Normal Saline. May Shower, gently pat wound dry prior to applying new dressing. Anesthetic: Wound #1 Right,Lateral Ankle: Topical Lidocaine 4% cream applied to wound bed prior to debridement - In clinic only Wound #2 Right Malleolus: Topical Lidocaine 4% cream applied to wound bed prior to debridement - In clinic only Primary Wound Dressing: Wound #1 Right,Lateral Ankle: Hydrafera Blue  Wound #2 Right Malleolus: Hydrafera Blue Secondary Dressing: Wound #1 Right,Lateral Ankle: Dry Gauze Boardered Foam Dressing Wound #2 Right Malleolus: Dry Gauze Boardered Foam Dressing Dressing Change Frequency: Wound #1 Right,Lateral Ankle: Change dressing every day. Wound #2 Right Malleolus: Change dressing every day. Follow-up Appointments: Wound #1 Right,Lateral Ankle: Return Appointment in 1 week. Wound #2 Right Malleolus: Return Appointment in 1 week. Additional Orders / Instructions: Wound #1 Right,Lateral Ankle: Stop Smoking Increase protein intake. Wound #2 Right Malleolus: Marrazzo, Wynn [name] ([id number]) Stop Smoking Increase protein intake. General Notes: Dressing had not been changed since 03/05/2017. Please change more often After review of the patient's wounds today, I have recommended: 1. we use Hydrofera Blue over all these areas and offload them appropriately. 2. discusses care in detail with his caregiver who is at the bedside today we will also communicate with the nursing staff at the facility to make sure they do appropriate dressing changes Electronic Signature(s) Signed: 03/12/2017 12:50:15 PM By: Christin Fudge MD, FACS Entered By: Christin Fudge on 03/12/2017 10:44:22 Limbach, Wynn [name] ([id number]) -------------------------------------------------------------------------------- SuperBill Details Patient Name: Dolby, Wynn [name] Date of Service: 03/12/2017 Medical Record Number: [id number] Patient Account Number: [id number] Date of Birth/Sex: Feb 16, 1934  (83 y.o. 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 other part of right foot with necrosis of muscle 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 F17.218 Nicotine dependence, cigarettes, with other nicotine-induced disorders Facility Procedures CPT4 Code: 68934068 Description: 99213 - WOUND CARE VISIT-LEV 3 EST PT Modifier: Quantity: 1 Physician Procedures CPT4: Description Modifier Quantity Code [id number] 99213 - WC PHYS LEVEL 3 - EST PT 1 ICD-10 Description Diagnosis 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

## 2017-03-14 NOTE — Progress Notes (Signed)
Travieso, Douds ([id number]) Visit Report for 03/12/2017 Arrival Information Details Patient Name: [name], [name] Date of Service: 03/12/2017 10:15 AM Medical Record Number: [id number] Patient Account Number: [id number] Date of Birth/Sex: January 02, 1934 (83 y.o. Male) Treating RN: Curtis Sites Primary Care Manasvini Whatley: Darreld Mclean Other Clinician: Referring Nou Chard: Darreld Mclean Treating Jo-Ann Johanning/Extender: Rudene Re in Treatment: 10 Visit Information History Since Last Visit Added or deleted any medications: No Patient Arrived: Wheel Chair Any new allergies or adverse reactions: No Arrival Time: 10:06 Had a fall or experienced change in No Accompanied By: social activities of daily living that may affect worker risk of falls: Transfer Assistance: Manual Signs or symptoms of abuse/neglect since last No Patient Identification Verified: Yes visito Secondary Verification Process Yes Hospitalized since last visit: No Completed: Has Dressing in Place as Prescribed: Yes Patient Has Alerts: Yes Pain Present Now: No Patient Alerts: ABI inaudible Electronic Signature(s) Signed: 03/12/2017 4:41:02 PM By: Curtis Sites Entered By: Curtis Sites on 03/12/2017 10:13:34 [name], [name] ([id number]) -------------------------------------------------------------------------------- Clinic Level of Care Assessment Details Patient Name: [name], [name] Date of Service: 03/12/2017 10:15 AM Medical Record Number: [id number] Patient Account Number: [id number] Date of Birth/Sex: [birth year] (83 y.o. Male) Treating RN: Curtis Sites Primary Care Christine Schiefelbein: Darreld Mclean Other Clinician: Referring Aolani Piggott: Darreld Mclean Treating Fabiano Ginley/Extender: Rudene Re in Treatment: 10 Clinic Level of Care Assessment Items TOOL 4 Quantity Score  - Use when only an EandM is performed on FOLLOW-UP visit 0 ASSESSMENTS - Nursing Assessment / Reassessment X - Reassessment of Co-morbidities (includes  updates in patient status) 1 10 X - Reassessment of Adherence to Treatment Plan 1 5 ASSESSMENTS - Wound and Skin Assessment / Reassessment  - Simple Wound Assessment / Reassessment - one wound 0 X - Complex Wound Assessment / Reassessment - multiple wounds 2 5  - Dermatologic / Skin Assessment (not related to wound area) 0 ASSESSMENTS - Focused Assessment  - Circumferential Edema Measurements - multi extremities 0  - Nutritional Assessment / Counseling / Intervention 0 X - Lower Extremity Assessment (monofilament, tuning fork, pulses) 1 5  - Peripheral Arterial Disease Assessment (using hand held doppler) 0 ASSESSMENTS - Ostomy and/or Continence Assessment and Care  - Incontinence Assessment and Management 0  - Ostomy Care Assessment and Management (repouching, etc.) 0 PROCESS - Coordination of Care X - Simple Patient / Family Education for ongoing care 1 15  - Complex (extensive) Patient / Family Education for ongoing care 0  - Staff obtains Chiropractor, Records, Test Results / Process Orders 0  - Staff telephones HHA, Nursing Homes / Clarify orders / etc 0  - Routine Transfer to another Facility (non-emergent condition) 0 [place], [place] ([id number])  - Routine Hospital Admission (non-emergent condition) 0  - New Admissions / Manufacturing engineer / Ordering NPWT, Apligraf, etc. 0  - Emergency Hospital Admission (emergent condition) 0 X - Simple Discharge Coordination 1 10  - Complex (extensive) Discharge Coordination 0 PROCESS - Special Needs  - Pediatric / Minor Patient Management 0  - Isolation Patient Management 0  - Hearing / Language / Visual special needs 0  - Assessment of Community assistance (transportation, D/C planning, etc.) 0  - Additional assistance / Altered mentation 0  - Support Surface(s) Assessment (bed, cushion, seat, etc.) 0 INTERVENTIONS - Wound Cleansing / Measurement  - Simple Wound Cleansing - one wound 0 X -  Complex Wound Cleansing - multiple wounds 2 5 X - Wound Imaging (photographs - any number of wounds) 1 5  - Wound Tracing (instead of  photographs) 0  - Simple Wound Measurement - one wound 0 X - Complex Wound Measurement - multiple wounds 2 5 INTERVENTIONS - Wound Dressings X - Small Wound Dressing one or multiple wounds 2 10  - Medium Wound Dressing one or multiple wounds 0  - Large Wound Dressing one or multiple wounds 0  - Application of Medications - topical 0  - Application of Medications - injection 0 INTERVENTIONS - Miscellaneous  - External ear exam 0 Avalos, [name] ([id number])  - Specimen Collection (cultures, biopsies, blood, body fluids, etc.) 0  - Specimen(s) / Culture(s) sent or taken to Lab for analysis 0 X - Patient Transfer (multiple staff / Nurse, adult / Similar devices) 1 10  - Simple Staple / Suture removal (25 or less) 0  - Complex Staple / Suture removal (26 or more) 0  - Hypo / Hyperglycemic Management (close monitor of Blood Glucose) 0  - Ankle / Brachial Index (ABI) - do not check if billed separately 0 X - Vital Signs 1 5 Has the patient been seen at the hospital within the last three years: Yes Total Score: 115 Level Of Care: New/Established - Level 3 Electronic Signature(s) Signed: 03/12/2017 4:41:02 PM By: Curtis Sites Entered By: Curtis Sites on 03/12/2017 10:52:27 [name], [name] ([id number]) -------------------------------------------------------------------------------- Encounter Discharge Information Details Patient Name: [name] Date of Service: 03/12/2017 10:15 AM Medical Record Number: [id number] Patient Account Number: [id number] Date of Birth/Sex: 01/19/1934 (83 y.o. Male) Treating RN: Curtis Sites Primary Care Judithann Villamar: Darreld Mclean Other Clinician: Referring Peterson Mathey: Darreld Mclean Treating Nakiesha Rumsey/Extender: Rudene Re in Treatment: 10 Encounter Discharge Information Items Discharge Pain Level:  0 Discharge Condition: Stable Ambulatory Status: Wheelchair Discharge Destination: Nursing Home Transportation: Private Auto Accompanied By: social worker Schedule Follow-up Appointment: Yes Medication Reconciliation completed No and provided to Patient/Care Kele Barthelemy: Provided on Clinical Summary of Care: 03/12/2017 Form Type Recipient Paper Patient LP Electronic Signature(s) Signed: 03/13/2017 10:11:21 AM By: Gwenlyn Perking Entered By: Gwenlyn Perking on 03/12/2017 10:44:52 Brilliant, [name] ([id number]) -------------------------------------------------------------------------------- Lower Extremity Assessment Details Patient Name: [name] Date of Service: 03/12/2017 10:15 AM Medical Record Number: [id number] Patient Account Number: [id number] Date of Birth/Sex: 1934-05-29 (83 y.o. Male) Treating RN: Curtis Sites Primary Care Sydne Krahl: Darreld Mclean Other Clinician: Referring Ilamae Geng: Darreld Mclean Treating Larren Copes/Extender: Rudene Re in Treatment: 10 Vascular Assessment Pulses: Dorsalis Pedis Palpable: [Right:Yes] Posterior Tibial Extremity colors, hair growth, and conditions: Extremity Color: [Right:Hyperpigmented] Hair Growth on Extremity: [Right:No] Temperature of Extremity: [Right:Warm] Capillary Refill: [Right:< 3 seconds] Electronic Signature(s) Signed: 03/12/2017 4:41:02 PM By: Curtis Sites Entered By: Curtis Sites on 03/12/2017 10:23:45 [name], [name] ([id number]) -------------------------------------------------------------------------------- Multi Wound Chart Details Patient Name: [name] Date of Service: 03/12/2017 10:15 AM Medical Record Number: [id number] Patient Account Number: [id number] Date of Birth/Sex: Apr 11, 1934 (83 y.o. 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10%] [N/A:N/A] Classification: [1:Full Thickness With Exposed Support Structures] [2:Full Thickness With Exposed Support Structures] [N/A:N/A] Exudate Amount: [1:Large] [2:Large] [N/A:N/A] Exudate Type: [1:Serous] [2:Serous] [N/A:N/A] Exudate Color: [1:amber] [2:amber] [N/A:N/A] Wound Margin: [1:Flat and Intact] [2:Flat and Intact] [N/A:N/A] Granulation Amount: [1:Large (67-100%)] [2:Large (67-100%)] [N/A:N/A] Granulation Quality: [1:Red] [2:Red] [N/A:N/A] Necrotic Amount: [1:None Present (0%)] [2:Small (1-33%)] [N/A:N/A] Exposed Structures: [1:Fat Layer (Subcutaneous Fat Layer (Subcutaneous N/A Tissue) Exposed: Yes] [2:Tissue) Exposed: Yes] Fascia: No Fascia: No Tendon: No Tendon: No Muscle: No Muscle: No Joint: No Joint: No Bone: No Bone: No Epithelialization: Large (67-100%) None N/A Periwound Skin Texture: Excoriation: No Excoriation: No N/A Induration:  No Induration: No Callus: No Callus: No Crepitus: No Crepitus: No Rash: No Rash: No Scarring: No Scarring: No Periwound Skin Maceration: No Maceration: No N/A Moisture: Dry/Scaly: No Dry/Scaly: No Periwound Skin Color: Atrophie Blanche: No Atrophie Blanche: No N/A Cyanosis: No Cyanosis: No Ecchymosis: No Ecchymosis: No Erythema: No Erythema: No Hemosiderin Staining: No Hemosiderin Staining: No Mottled: No Mottled: No Pallor: No Pallor: No Rubor: No Rubor: No Temperature: No Abnormality No Abnormality N/A Tenderness on Yes Yes N/A Palpation: Wound Preparation: Ulcer Cleansing: Ulcer Cleansing: N/A Rinsed/Irrigated with Rinsed/Irrigated with Saline Saline Topical Anesthetic Topical Anesthetic Applied: Other: lidocaine Applied: Other: lidocaine 4% 4% Treatment Notes Electronic Signature(s) Signed: 03/12/2017 12:50:15 PM By: Evlyn Kanner MD, FACS Entered By: Evlyn Kanner on 03/12/2017 10:42:36 Kinnan, [name] ([id number]) -------------------------------------------------------------------------------- Multi-Disciplinary Care Plan Details Patient Name: [name] Date of Service: 03/12/2017 10:15 AM Medical Record Number: [id number] Patient Account Number: [id number] Date of Birth/Sex: March 01, 1934 (83 y.o. Male) Treating RN: Curtis Sites Primary Care Onnie Alatorre: Darreld Mclean Other Clinician: Referring Jontavius Rabalais: Darreld Mclean Treating Asalee Barrette/Extender: Rudene Re in Treatment: 10 Active Inactive ` Abuse / Safety / Falls / Self Care Management Nursing Diagnoses: Potential for falls Goals: Patient will not experience any injury related to falls Date Initiated: 12/30/2016 Target Resolution Date: 02/27/2017 Goal Status: Active Interventions: Assess fall risk on admission and as needed Notes: ` Orientation to the Wound Care Program Nursing Diagnoses: Knowledge deficit related to the wound healing center program Goals: Patient/caregiver will  verbalize understanding of the Wound Healing Center Program Date Initiated: 12/30/2016 Target Resolution Date: 02/27/2017 Goal Status: Active Interventions: Provide education on orientation to the wound center Notes: ` Wound/Skin Impairment Nursing Diagnoses: Impaired tissue integrity Unterreiner, [name] ([id number]) Goals: Ulcer/skin breakdown will have a volume reduction of 30% by week 4 Date Initiated: 12/30/2016 Target Resolution Date: 02/27/2017 Goal Status: Active Ulcer/skin breakdown will have a volume reduction of 50% by week 8 Date Initiated: 12/30/2016 Target Resolution Date: 02/27/2017 Goal Status: Active Ulcer/skin breakdown will have a volume reduction of 80% by week 12 Date Initiated: 12/30/2016 Target Resolution Date: 02/27/2017 Goal Status: Active Ulcer/skin breakdown will heal within 14 weeks Date Initiated: 12/30/2016 Target Resolution Date: 02/27/2017 Goal Status: Active Interventions: Assess patient/caregiver ability to obtain necessary supplies Assess patient/caregiver ability to perform ulcer/skin care regimen upon admission and as needed Assess ulceration(s) every visit Notes: Electronic Signature(s) Signed: 03/12/2017 4:41:02 PM By: Curtis Sites Entered By: Curtis Sites on 03/12/2017 10:23:52 [name], [name] ([id number]) -------------------------------------------------------------------------------- Pain Assessment Details Patient Name: [name] Date of Service: 03/12/2017 10:15 AM Medical Record Number: [id number] Patient Account Number: [id number] Date of Birth/Sex: 1934-05-07 (82 y.o. Male) Treating RN: Curtis Sites Primary Care Greg Eckrich: Darreld Mclean Other Clinician: Referring Antwain Caliendo: Darreld Mclean Treating  Albie Arizpe/Extender: Rudene Re in Treatment: 10 Active Problems Location of Pain Severity and Description of Pain Patient Has Paino No Site Locations Pain Management and Medication Current Pain Management: Notes Topical or  injectable lidocaine is offered to patient for acute pain when surgical debridement is performed. If needed, Patient is instructed to use over the counter pain medication for the following 24-48 hours after debridement. Wound care MDs do not prescribed pain medications. Patient has chronic pain or uncontrolled pain. Patient has been instructed to make an appointment with their Primary Care Physician for pain management. Electronic Signature(s) Signed: 03/12/2017 4:41:02 PM By: Curtis Sites Entered By: Curtis Sites on 03/12/2017 10:15:25 [name], [name] ([id number]) -------------------------------------------------------------------------------- Patient/Caregiver Education Details Patient Name: [name] Date of Service: 03/12/2017 10:15 AM Medical Record Number: [id number] Patient Account Number: [id number] Date of Birth/Gender: 1933-08-06 (83 y.o. Male) Treating RN: Curtis Sites Primary Care Physician: Darreld Mclean Other Clinician: Referring Physician: Darreld Mclean Treating Physician/Extender: Rudene Re in Treatment: 10 Education Assessment Education Provided To: Caregiver SNF nurses via written orders Education Topics Provided Wound/Skin Impairment: Handouts: Other: wound care orders Methods: Printed Electronic Signature(s) Signed: 03/12/2017 4:41:02 PM By: Curtis Sites Entered By: Curtis Sites on 03/12/2017 10:39:42 [name], [name] ([id number]) -------------------------------------------------------------------------------- Wound Assessment Details Patient Name: [name] Date of Service: 03/12/2017 10:15 AM Medical Record Number: [id number] Patient Account Number: [id number] Date of Birth/Sex: [birth year] (83 y.o. Male) Treating RN: Curtis Sites Primary Care Terina Mcelhinny: Darreld Mclean Other Clinician: Referring Ravyn Nikkel: Darreld Mclean Treating Adan Beal/Extender: Rudene Re in Treatment: 10 Wound Status Wound Number: 1 Primary Arterial Insufficiency  Ulcer Etiology: Wound Location: Right Ankle - Lateral Wound Open Wounding Event: Gradually Appeared Status: Date Acquired: 12/04/2016 Comorbid Hypertension, Peripheral Venous Weeks Of Treatment: 10 History: Disease, Gout, Osteoarthritis Clustered Wound: No Pending Amputation On Presentation Photos Photo Uploaded By: Curtis Sites on 03/12/2017 14:53:55 Wound Measurements Length: (cm) 0.1 Width: (cm) 0.1 Depth: (cm) 0.1 Area: (cm) 0.008 Volume: (cm) 0.001 % Reduction in Area: 99.9% % Reduction in Volume: 99.9% Epithelialization: Large (67-100%) Tunneling: No Undermining: No Wound Description Full Thickness With Exposed Foul Odor After Classification: Support Structures Slough/Fibrino Wound Margin: Flat and Intact Exudate Large Amount: Exudate Type: Serous Exudate Color: amber Cleansing: No Yes Wound Bed Granulation Amount: Large (67-100%) Exposed Structure Granulation Quality: Red Fascia Exposed: No [name], [name] ([id number]) Necrotic Amount: None Present (0%) Fat Layer (Subcutaneous Tissue) Exposed: Yes Tendon Exposed: No Muscle Exposed: No Joint Exposed: No Bone Exposed: No Periwound Skin Texture Texture Color No Abnormalities Noted: No No Abnormalities Noted: No Callus: No Atrophie Blanche: No Crepitus: No Cyanosis: No Excoriation: No Ecchymosis: No Induration: No Erythema: No Rash: No Hemosiderin Staining: No Scarring: No Mottled: No Pallor: No Moisture Rubor: No No Abnormalities Noted: No Dry / Scaly: No Temperature / Pain Maceration: No Temperature: No Abnormality Tenderness on Palpation: Yes Wound Preparation Ulcer Cleansing: Rinsed/Irrigated with Saline Topical Anesthetic Applied: Other: lidocaine 4%, Treatment Notes Wound #1 (Right, Lateral Ankle) 1. Cleansed with: Clean wound with Normal Saline 2. Anesthetic Topical Lidocaine 4% cream to wound bed prior to debridement 4. Dressing Applied: Hydrafera Blue 5. Secondary  Dressing Applied Bordered Foam Dressing Notes netting Electronic Signature(s) Signed: 03/12/2017 4:41:02 PM By: Curtis Sites Entered By: Curtis Sites on 03/12/2017 10:22:17 [name], [name] ([id number]) -------------------------------------------------------------------------------- Wound Assessment Details Patient Name: [name] Date of Service: 03/12/2017 10:15 AM Medical Record Number: [id number] Patient Account Number: [id number] Date of Birth/Sex: [birth year] (83 y.o. Male) Treating RN: Curtis Sites Primary Care Abrey Bradway: Darreld Mclean Other Clinician:  Referring Reymond Maynez: Darreld McleanMILES, LINDA Treating Jaelynne Hockley/Extender: Rudene ReBritto, Errol Weeks in Treatment: 10 Wound Status Wound Number: 2 Primary Arterial Insufficiency Ulcer Etiology: Wound Location: Right Malleolus Wound Open Wounding Event: Gradually Appeared Status: Date Acquired: 12/04/2016 Comorbid Hypertension, Peripheral Venous Weeks Of Treatment: 10 History: Disease, Gout, Osteoarthritis Clustered Wound: No Pending Amputation On Presentation Photos Photo Uploaded By: Curtis Sitesorthy, Joanna on 03/12/2017 14:53:56 Wound Measurements Length: (cm) 0.8 % Reduction in Width: (cm) 0.8 % Reduction in Depth: (cm) 0.1 Epithelializati Area: (cm) 0.503 Tunneling: Volume: (cm) 0.05 Undermining: Area: 90.1% Volume: 95.1% on: None No No Wound Description Full Thickness With Exposed Foul Odor After Classification: Support Structures Slough/Fibrino Wound Margin: Flat and Intact Exudate Large Amount: Exudate Type: Serous Exudate Color: amber Cleansing: No Yes Wound Bed Granulation Amount: Large (67-100%) Exposed Structure Granulation Quality: Red Fascia Exposed: No Renfrow, [name] ([id number]) Necrotic Amount: Small (1-33%) Fat Layer (Subcutaneous Tissue) Exposed: Yes Necrotic Quality: Adherent Slough Tendon Exposed: No Muscle Exposed: No Joint Exposed: No Bone Exposed: No Periwound Skin Texture Texture Color No  Abnormalities Noted: No No Abnormalities Noted: No Callus: No Atrophie Blanche: No Crepitus: No Cyanosis: No Excoriation: No Ecchymosis: No Induration: No Erythema: No Rash: No Hemosiderin Staining: No Scarring: No Mottled: No Pallor: No Moisture Rubor: No No Abnormalities Noted: No Dry / Scaly: No Temperature / Pain Maceration: No Temperature: No Abnormality Tenderness on Palpation: Yes Wound Preparation Ulcer Cleansing: Rinsed/Irrigated with Saline Topical Anesthetic Applied: Other: lidocaine 4%, Treatment Notes Wound #2 (Right Malleolus) 1. Cleansed with: Clean wound with Normal Saline 2. Anesthetic Topical Lidocaine 4% cream to wound bed prior to debridement 4. Dressing Applied: Hydrafera Blue 5. Secondary Dressing Applied Bordered Foam Dressing Notes netting Electronic Signature(s) Signed: 03/12/2017 4:41:02 PM By: Curtis Sitesorthy, Joanna Entered By: Curtis Sitesorthy, Joanna on 03/12/2017 10:22:30 Feld, [name] ([id number]) -------------------------------------------------------------------------------- Vitals Details Patient Name: [name] [name], Kristian Date of Service: 03/12/2017 10:15 AM Medical Record Number: [id number] Patient Account Number: [id number] Date of Birth/Sex: [birth year](83 y.o. Male) Treating RN: Curtis Sitesorthy, Joanna Primary Care Ariyon Gerstenberger: Darreld McleanMILES, LINDA Other Clinician: Referring Madhavi Hamblen: Darreld McleanMILES, LINDA Treating Nataki Mccrumb/Extender: Rudene ReBritto, Errol Weeks in Treatment: 10 Vital Signs Time Taken: 10:15 Temperature (F): 97.7 Pulse (bpm): 90 Respiratory Rate (breaths/min): 16 Blood Pressure (mmHg): 91/56 Reference Range: 80 - 120 mg / dl Electronic Signature(s) Signed: 03/12/2017 4:41:02 PM By: Curtis Sitesorthy, Joanna Entered By: Curtis Sitesorthy, Joanna on 03/12/2017 10:15:44

## 2017-03-19 ENCOUNTER — Encounter: Payer: Medicare HMO | Admitting: Surgery

## 2017-03-19 DIAGNOSIS — I70235 Atherosclerosis of native arteries of right leg with ulceration of other part of foot: Secondary | ICD-10-CM | POA: Diagnosis not present

## 2017-03-21 NOTE — Progress Notes (Signed)
Geck, [place] ([id number]) Visit Report for 03/19/2017 Chief Complaint Document Details Patient Name: PRIDEDemarius, [name] Date of Service: 03/19/2017 9:15 AM Medical Record Number: [id number] Patient Account Number: [id number] Date of Birth/Sex: December 31, 1933 (83 y.o. Male) Treating RN: Montey Hora Primary Care Provider: Delight Stare Other Clinician: Referring Provider: Delight Stare Treating Provider/Extender: Frann Rider in Treatment: 11 Information Obtained from: Patient Chief Complaint 12/30/16; patient arrives from [place] unaccompanied. He has 3 open wounds on the right foot and 2 threatened areas Electronic Signature(s) Signed: 03/19/2017 5:02:50 PM By: Christin Fudge MD, FACS Entered By: Christin Fudge on 03/19/2017 09:57:28 [name], [name] ([id number]) -------------------------------------------------------------------------------- HPI Details Patient Name: [name] Date of Service: 03/19/2017 9:15 AM Medical Record Number: [id number] Patient Account Number: [id number] Date of Birth/Sex: [birth year] (83 y.o. Male) Treating RN: Montey Hora Primary Care Provider: Delight Stare Other Clinician: Referring Provider: Delight Stare Treating Provider/Extender: Frann Rider in Treatment: 11 History of Present Illness HPI Description: 12/30/16; this is an 83 year old man who comes from [place] skilled facility. He comes today unaccompanied and without any history. Looking through [facility] link I am able to see that he was admitted to hospital from 5/25 through 12/04/16 at that point with dehydration, severe hyponatremia acute kidney injury. He was treated with IV fluid replacement including 3% normal saline. Nowhere in his discharge summary did I see any reference to wounds on his feet and is mentioned he does not come in with any history. He does come in with a pack of cigarettes therefore I'm assuming he is a smoker. He is not a listed diabetic.  There is some reference in his notes that he has a DSS Education officer, museum is his guardian I'm not completely sure if that is true in any case the patient has some degree of dementia although he is able to tell me that he is 83 years old came from [place]. He is not able to give a history of these wounds. Our intake nurse notes maggots in the right third toe. Beyond this he has an area over the right lateral malleolus on the right lateral foot. He also has to threatened areas on the base of the right fifth metatarsal and on the lateral aspect of the right fifth metatarsal head. We are completely unable to do ABIs in the clinic largely because when we inflated the cuff he seemed to withdraw the foot. 01/05/17; the patient was seen urgently by Dr. Delana Meyer of vein and vascular. He agreed with our assessment of critical limb ischemia and set him up for an angiogram. Unfortunately the patient was not transported for preoperative evaluation therefore the angiogram has been canceled. I am not exactly sure if and when this is going to be rescheduled. The patient's wounds look about the same, right lateral ankle right lateral foot third toe. There is also concerning areas over the fifth met head and base of the fifth metatarsal which are not open but are covered by a dark eschar logorrhea and painful. We have been using silver alginate and x-ray of the right foot done at the facility showed no definite radiographic evidence of acute fracture or dislocation there was an old avulsion fracture of the proximal phalanx fifth digit as well as old fractures of the base of the proximal phalanx of the fifth digit. There is no comment about osteomyelitis 01/16/17 I have patient's record from his operative report from [facility] with Dr. Delana Meyer were fortunately patient underwent a successful angioplasty in regard to  his right lower extremity. The anterior tibial in-line flow was improved with less than  5% residual stenosis. Angioplasty of the SFA and popliteal arteries also yielded excellent results in post procedure testing with less than 10% residual stenosis. Overall patient appears to have done very well with this procedure. Hopefully this will translate into better wound healing in regard to his right lower extremity wounds. 01/22/2017 -- the patient comes with no dressings appropriate on his right lower extremity and the patient is rather non-verbal and coherent. He has no nursing or caregiver 02/12/17 on evaluation today patient appears to be doing a little better in regard to his wounds and the Broeker, VIKASH ([id number]) dressings were in place at all for wound locations as they should be. With that being said he has some discomfort noted with cleansing of the wounds. No fevers, chills, nausea, or vomiting noted at this time. 02/19/17 on evaluation today patient appears to be doing well in regard to his wounds. All are measuring a little bit smaller at this time. He has been tolerating the dressing changes without complication. He does appear to be quite a bit drowsy today when questioned he tells me that he didn't sleep well last night due to arthritis. Otherwise he does not describe any pain at this time although she really did not talk much. No fevers, chills, nausea, or vomiting noted at this time. 02/26/2017 -- we have been having problems having the patient come here without any caregivers and today his caseworker was here and we had a detailed discussion with her regarding his treatment plan and the lack of coordinated care at his facility. She will look into this. 03/19/2017 -- patient comes along with his caregiver and of note he has a large swelling in the right periorbital region with ecchymosis and raccoon eyes. The caregiver is not sure whether he has been worked up for this and is not sure when this happened. Electronic Signature(s) Signed: 03/19/2017 5:02:50 PM By: Christin Fudge MD, FACS Entered By: Christin Fudge on 03/19/2017 09:58:13 [name], [name] ([id number]) -------------------------------------------------------------------------------- Physical Exam Details Patient Name: [name] Date of Service: 03/19/2017 9:15 AM Medical Record Number: [id number] Patient Account Number: [id number] Date of Birth/Sex: 20-Jan-1934 (83 y.o. Male) Treating RN: Montey Hora Primary Care Provider: Delight Stare Other Clinician: Referring Provider: Delight Stare Treating Provider/Extender: Frann Rider in Treatment: 11 Constitutional . Pulse regular. Respirations normal and unlabored. Afebrile. . Eyes Nonicteric. Reactive to light. Ears, Nose, Mouth, and Throat Lips, teeth, and gums WNL.Marland Kitchen Moist mucosa without lesions. Neck supple and nontender. No palpable supraclavicular or cervical adenopathy. Normal sized without goiter. Respiratory WNL. No retractions.. Breath sounds WNL, No rubs, rales, rhonchi, or wheeze.. Cardiovascular Heart rhythm and rate regular, no murmur or gallop.. Pedal Pulses WNL. No clubbing, cyanosis or edema. Lymphatic No adneopathy. No adenopathy. No adenopathy. Musculoskeletal Adexa without tenderness or enlargement.. Digits and nails w/o clubbing, cyanosis, infection, petechiae, ischemia, or inflammatory conditions.. Integumentary (Hair, Skin) No suspicious lesions. No crepitus or fluctuance. No peri-wound warmth or erythema. No masses.Marland Kitchen Psychiatric Judgement and insight Intact.. No evidence of depression, anxiety, or agitation.. Notes the only wound which is mildly open is the one on the right lateral ankle and the rest of the wounds have all healed Electronic Signature(s) Signed: 03/19/2017 5:02:50 PM By: Christin Fudge MD, FACS Entered By: Christin Fudge on 03/19/2017 09:58:39 [name], [name] ([id number]) -------------------------------------------------------------------------------- Physician Orders Details Patient Name:  [name] Date of Service: 03/19/2017 9:15 AM Medical Record Number: [id number] Patient  Account Number: [id number] Date of Birth/Sex: 06/12/1934 (83 y.o. Male) Treating RN: Montey Hora Primary Care Provider: Delight Stare Other Clinician: Referring Provider: Delight Stare Treating Provider/Extender: Frann Rider in Treatment: 11 Verbal / Phone Orders: No Diagnosis Coding Wound Cleansing Wound #2 Right Malleolus o Clean wound with Normal Saline. o May Shower, gently pat wound dry prior to applying new dressing. Anesthetic Wound #2 Right Malleolus o Topical Lidocaine 4% cream applied to wound bed prior to debridement - In clinic only Primary Wound Dressing Wound #2 Right Malleolus o Hydrafera Blue Secondary Dressing Wound #2 Right Malleolus o Boardered Foam Dressing Dressing Change Frequency Wound #2 Right Malleolus o Change dressing every day. Follow-up Appointments Wound #2 Right Malleolus o Return Appointment in 1 week. Additional Orders / Instructions Wound #2 Right Malleolus o Stop Smoking o Increase protein intake. Electronic Signature(s) Signed: 03/19/2017 5:02:50 PM By: Christin Fudge MD, FACS Signed: 03/19/2017 5:58:36 PM By: Montey Hora Swarm, [name] ([id number]) Entered By: Montey Hora on 03/19/2017 09:55:23 [name], [name] ([id number]) -------------------------------------------------------------------------------- Problem List Details Patient Name: [name], [name] Date of Service: 03/19/2017 9:15 AM Medical Record Number: [id number] Patient Account Number: [id number] Date of Birth/Sex: [birth year] (83 y.o. 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.513 Non-pressure chronic ulcer of other part of right foot with 12/30/2016 Yes necrosis of muscle I70.235 Atherosclerosis of native arteries of right leg with 12/30/2016 Yes ulceration of other part of foot I70.245 Atherosclerosis of native arteries of left leg with ulceration 12/30/2016 Yes of other part of foot F17.218 Nicotine dependence, cigarettes, with other nicotine- 01/22/2017 Yes induced disorders Inactive Problems Resolved Problems Electronic Signature(s) Signed: 03/19/2017 5:02:50 PM By: Christin Fudge MD, FACS Entered By: Christin Fudge on 03/19/2017 09:57:08 Auriemma, [name] ([id number]) -------------------------------------------------------------------------------- Progress Note Details Patient Name: [name] Date of Service: 03/19/2017 9:15 AM Medical Record Number: [id number] Patient Account Number: [id number] Date of Birth/Sex: [birth year] (83 y.o. Male) Treating RN: Montey Hora Primary Care Provider: Delight Stare Other Clinician: Referring Provider: Delight Stare Treating Provider/Extender: Frann Rider in Treatment: 11 Subjective Chief Complaint Information obtained from Patient 12/30/16; patient arrives from [place] unaccompanied. He has 3 open wounds on the right foot and 2 threatened areas History of Present Illness (HPI) 12/30/16; this is an 83 year old man who comes from [place] skilled facility. He comes today unaccompanied and without any history. Looking through [facility] link I am able to see that he was admitted to hospital from 5/25 through 12/04/16 at that point with dehydration, severe hyponatremia acute kidney injury. He was treated with IV fluid replacement including 3% normal saline. Nowhere in his discharge summary did I see any reference to wounds on his feet and is mentioned he  does not come in with any history. He does come in with a pack of cigarettes therefore I'm assuming he is a smoker. He is not a listed diabetic. There is some reference in his notes that he has a DSS Education officer, museum is his guardian I'm not completely sure if that is true in any case the patient has some degree of dementia although he is able to tell me that he is 83 years old came from [place].  He is not able to give a history of these wounds. Our intake nurse notes maggots in the right third toe. Beyond this he has an area over the right lateral malleolus on the right lateral foot. He also has to threatened areas on the base of the right fifth metatarsal and on the lateral aspect of the right fifth metatarsal head. We are completely unable to do ABIs in the clinic largely because when we inflated the cuff he seemed to withdraw the foot. 01/05/17; the patient was seen urgently by Dr. Delana Meyer of vein and vascular. He agreed with our assessment of critical limb ischemia and set him up for an angiogram. Unfortunately the patient was not transported for preoperative evaluation therefore the angiogram has been canceled. I am not exactly sure if and when this is going to be rescheduled. The patient's wounds look about the same, right lateral ankle right lateral foot third toe. There is also concerning areas over the fifth met head and base of the fifth metatarsal which are not open but are covered by a dark eschar logorrhea and painful. We have been using silver alginate and x-ray of the right foot done at the facility showed no definite radiographic evidence of acute fracture or dislocation there was an old avulsion fracture of the proximal phalanx fifth digit as well as old fractures of the base of the proximal phalanx of the fifth digit. There is no comment about osteomyelitis 01/16/17 I have patient's record from his operative report from [facility] with Dr. Delana Meyer were  fortunately patient underwent a successful angioplasty in regard to his right lower extremity. The anterior tibial in-line flow was improved with less than 5% residual stenosis. Angioplasty of the SFA and popliteal arteries also yielded excellent results in post procedure testing with less than 10% residual [name], [name] ([id number]) stenosis. Overall patient appears to have done very well with this procedure. Hopefully this will translate into better wound healing in regard to his right lower extremity wounds. 01/22/2017 -- the patient comes with no dressings appropriate on his right lower extremity and the patient is rather non-verbal and coherent. He has no nursing or caregiver 02/12/17 on evaluation today patient appears to be doing a little better in regard to his wounds and the dressings were in place at all for wound locations as they should be. With that being said he has some discomfort noted with cleansing of the wounds. No fevers, chills, nausea, or vomiting noted at this time. 02/19/17 on evaluation today patient appears to be doing well in regard to his wounds. All are measuring a little bit smaller at this time. He has been tolerating the dressing changes without complication. He does appear to be quite a bit drowsy today when questioned he tells me that he didn't sleep well last night due to arthritis. Otherwise he does not describe any pain at this time although she really did not talk much. No fevers, chills, nausea, or vomiting noted at this time. 02/26/2017 -- we have been having problems having the patient come here without any caregivers and today his caseworker was here and we had a detailed discussion with her regarding his treatment plan and the lack of coordinated care at his facility. She will look into this. 03/19/2017 -- patient comes along with his caregiver and of note he has a large swelling in the right periorbital region with ecchymosis and raccoon eyes. The  caregiver is not sure whether he has been worked up for this  and is not sure when this happened. Objective Constitutional Pulse regular. Respirations normal and unlabored. Afebrile. Vitals Time Taken: 9:42 AM, Temperature: 97.6 F, Pulse: 82 bpm, Respiratory Rate: 16 breaths/min, Blood Pressure: 150/79 mmHg. Eyes Nonicteric. Reactive to light. Ears, Nose, Mouth, and Throat Lips, teeth, and gums WNL.Marland Kitchen Moist mucosa without lesions. Neck supple and nontender. No palpable supraclavicular or cervical adenopathy. Normal sized without goiter. [name], [name] ([id number]) Respiratory WNL. No retractions.. Breath sounds WNL, No rubs, rales, rhonchi, or wheeze.. Cardiovascular Heart rhythm and rate regular, no murmur or gallop.. Pedal Pulses WNL. No clubbing, cyanosis or edema. Lymphatic No adneopathy. No adenopathy. No adenopathy. Musculoskeletal Adexa without tenderness or enlargement.. Digits and nails w/o clubbing, cyanosis, infection, petechiae, ischemia, or inflammatory conditions.Marland Kitchen Psychiatric Judgement and insight Intact.. No evidence of depression, anxiety, or agitation.. General Notes: the only wound which is mildly open is the one on the right lateral ankle and the rest of the wounds have all healed Integumentary (Hair, Skin) No suspicious lesions. No crepitus or fluctuance. No peri-wound warmth or erythema. No masses.. Wound #1 status is Healed - Epithelialized. Original cause of wound was Gradually Appeared. The wound is located on the Right,Lateral Ankle. The wound measures 0cm length x 0cm width x 0cm depth; 0cm^2 area and 0cm^3 volume. Wound #2 status is Open. Original cause of wound was Gradually Appeared. The wound is located on the Right Malleolus. The wound measures 0.5cm length x 0.6cm width x 0.1cm depth; 0.236cm^2 area and 0.024cm^3 volume. There is Fat Layer (Subcutaneous Tissue) Exposed exposed. There is no tunneling or undermining noted. There is a large amount of  serous drainage noted. The wound margin is flat and intact. There is large (67-100%) red granulation within the wound bed. There is no necrotic tissue within the wound bed. The periwound skin appearance did not exhibit: Callus, Crepitus, Excoriation, Induration, Rash, Scarring, Dry/Scaly, Maceration, Atrophie Blanche, Cyanosis, Ecchymosis, Hemosiderin Staining, Mottled, Pallor, Rubor, Erythema. Periwound temperature was noted as No Abnormality. The periwound has tenderness on palpation. 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 May Shower, gently pat wound dry prior to applying new dressing. Anesthetic: Wound #2 Right Malleolus: Topical Lidocaine 4% cream applied to wound bed prior to debridement - In clinic only Primary Wound Dressing: Wound #2 Right Malleolus: Hydrafera Blue Secondary Dressing: Wound #2 Right Malleolus: Boardered Foam Dressing Dressing Change Frequency: Wound #2 Right Malleolus: Change dressing every day. Follow-up Appointments: Wound #2 Right Malleolus: Return Appointment in 1 week. Additional Orders / Instructions: Wound #2 Right Malleolus: Stop Smoking Increase protein intake. Note is made of the fact of the unrelated problem where he has ecchymosis and peri-orbital edema and swelling possibly related to a fall. Though he is conscious  and alert at his baseline I have told the caregiver to make sure that he's had an appropriate workup. After review of the patient's wounds today, I have recommended: 1. we use Hydrofera Blue over all these areas and  offload them appropriately. 2. discusses care in detail with his caregiver who is at the bedside today we will also communicate with the nursing staff at the facility to make sure they do appropriate dressing changes Hoelzer, [name] ([id number]) Electronic Signature(s) Signed: 03/19/2017 5:02:50 PM By: Christin Fudge MD, FACS Entered By: Christin Fudge on 03/19/2017 10:00:12 [name], [name] ([id number]) -------------------------------------------------------------------------------- SuperBill Details Patient Name: [name] Date of Service: 03/19/2017 Medical Record Number: [id number] Patient Account Number: [id number] Date of Birth/Sex: 03-28-1934 (83 y.o. 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 other part of right foot with necrosis of muscle 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 F17.218 Nicotine dependence, cigarettes, with other nicotine-induced disorders Facility Procedures CPT4 Code: [id number] Description: 99213 - WOUND CARE VISIT-LEV 3 EST PT Modifier: Quantity: 1 Physician Procedures CPT4: Description Modifier Quantity Code [id number] 99213 - WC PHYS LEVEL 3 - EST PT 1 ICD-10 Description Diagnosis L97.311 Non-pressure chronic ulcer  of right ankle limited to breakdown of skin L97.513 Non-pressure chronic ulcer of other part of right foot  with necrosis of muscle 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

## 2017-03-23 NOTE — Progress Notes (Signed)
[place], [place] ([id number]) Visit Report for 03/19/2017 Arrival Information Details Patient Name: [name], [name] Date of Service: 03/19/2017 9:15 AM Medical Record Number: [id number] Patient Account Number: [id number] Date of Birth/Sex: 03/08/1934 (83 y.o. Male) Treating RN: Curtis Sites Primary Care Ralpheal Zappone: Darreld Mclean Other Clinician: Referring Laloni Rowton: Darreld Mclean Treating Cledith Kamiya/Extender: Rudene Re in Treatment: 11 Visit Information History Since Last Visit All ordered tests and consults were completed: No Patient Arrived: Wheel Chair Added or deleted any medications: No Arrival Time: 09:39 Any new allergies or adverse reactions: No Accompanied By: caregiver Had a fall or experienced change in No activities of daily living that may affect Transfer Assistance: Other risk of falls: Patient Identification Verified: Yes Signs or symptoms of abuse/neglect since last No Secondary Verification Process Yes visito Completed: Hospitalized since last visit: No Patient Requires Transmission-Based No Has Dressing in Place as Prescribed: Yes Precautions: Pain Present Now: Yes Patient Has Alerts: Yes Patient Alerts: ABI inaudible Electronic Signature(s) Signed: 03/19/2017 5:58:36 PM By: Curtis Sites Entered By: Curtis Sites on 03/19/2017 09:40:51 [name], [name] ([id number]) -------------------------------------------------------------------------------- Clinic Level of Care Assessment Details Patient Name: [name], [name] Date of Service: 03/19/2017 9:15 AM Medical Record Number: [id number] Patient Account Number: [id number] Date of Birth/Sex: 11/29/1933 (82 y.o. Male) Treating RN: Curtis Sites Primary Care Verleen Stuckey: Darreld Mclean Other Clinician: Referring Kolbie Lepkowski: Darreld Mclean Treating Jaymen Fetch/Extender: Rudene Re in Treatment: 11 Clinic Level of Care Assessment Items TOOL 4 Quantity Score  - Use when only an EandM is performed on FOLLOW-UP visit  0 ASSESSMENTS - Nursing Assessment / Reassessment X - Reassessment of Co-morbidities (includes updates in patient status) 1 10 X - Reassessment of Adherence to Treatment Plan 1 5 ASSESSMENTS - Wound and Skin Assessment / Reassessment  - Simple Wound Assessment / Reassessment - one wound 0 X - Complex Wound Assessment / Reassessment - multiple wounds 2 5  - Dermatologic / Skin Assessment (not related to wound area) 0 ASSESSMENTS - Focused Assessment  - Circumferential Edema Measurements - multi extremities 0  - Nutritional Assessment / Counseling / Intervention 0 X - Lower Extremity Assessment (monofilament, tuning fork, pulses) 1 5  - Peripheral Arterial Disease Assessment (using hand held doppler) 0 ASSESSMENTS - Ostomy and/or Continence Assessment and Care  - Incontinence Assessment and Management 0  - Ostomy Care Assessment and Management (repouching, etc.) 0 PROCESS - Coordination of Care X - Simple Patient / Family Education for ongoing care 1 15  - Complex (extensive) Patient / Family Education for ongoing care 0  - Staff obtains Chiropractor, Records, Test Results / Process Orders 0  - Staff telephones HHA, Nursing Homes / Clarify orders / etc 0  - Routine Transfer to another Facility (non-emergent condition) 0 [place], [place] ([id number])  - Routine Hospital Admission (non-emergent condition) 0  - New Admissions / Manufacturing engineer / Ordering NPWT, Apligraf, etc. 0  - Emergency Hospital Admission (emergent condition) 0 X - Simple Discharge Coordination 1 10  - Complex (extensive) Discharge Coordination 0 PROCESS - Special Needs  - Pediatric / Minor Patient Management 0  - Isolation Patient Management 0  - Hearing / Language / Visual special needs 0  - Assessment of Community assistance (transportation, D/C planning, etc.) 0  - Additional assistance / Altered mentation 0  - Support Surface(s) Assessment (bed, cushion, seat, etc.)  0 INTERVENTIONS - Wound Cleansing / Measurement  - Simple Wound Cleansing - one wound 0 X - Complex Wound Cleansing - multiple wounds 2 5 X - Wound Imaging (photographs -  any number of wounds) 1 5  - Wound Tracing (instead of photographs) 0  - Simple Wound Measurement - one wound 0 X - Complex Wound Measurement - multiple wounds 2 5 INTERVENTIONS - Wound Dressings X - Small Wound Dressing one or multiple wounds 1 10  - Medium Wound Dressing one or multiple wounds 0  - Large Wound Dressing one or multiple wounds 0  - Application of Medications - topical 0  - Application of Medications - injection 0 INTERVENTIONS - Miscellaneous  - External ear exam 0 Kingry, [name] ([id number])  - Specimen Collection (cultures, biopsies, blood, body fluids, etc.) 0  - Specimen(s) / Culture(s) sent or taken to Lab for analysis 0 X - Patient Transfer (multiple staff / Nurse, adult / Similar devices) 1 10  - Simple Staple / Suture removal (25 or less) 0  - Complex Staple / Suture removal (26 or more) 0  - Hypo / Hyperglycemic Management (close monitor of Blood Glucose) 0  - Ankle / Brachial Index (ABI) - do not check if billed separately 0 X - Vital Signs 1 5 Has the patient been seen at the hospital within the last three years: Yes Total Score: 105 Level Of Care: New/Established - Level 3 Electronic Signature(s) Signed: 03/19/2017 5:58:36 PM By: Curtis Sites Entered By: Curtis Sites on 03/19/2017 15:05:36 [name], [name] ([id number]) -------------------------------------------------------------------------------- Encounter Discharge Information Details Patient Name: [name] Date of Service: 03/19/2017 9:15 AM Medical Record Number: [id number] Patient Account Number: [id number] Date of Birth/Sex: 1933-09-10 (83 y.o. Male) Treating RN: Curtis Sites Primary Care Kylena Mole: Darreld Mclean Other Clinician: Referring Crystale Giannattasio: Darreld Mclean Treating Lewanda Perea/Extender:  Rudene Re in Treatment: 11 Encounter Discharge Information Items Discharge Pain Level: 0 Discharge Condition: Stable Ambulatory Status: Wheelchair Discharge Destination: Nursing Home Transportation: Private Auto Accompanied By: social worker Schedule Follow-up Appointment: Yes Medication Reconciliation completed and provided to Patient/Care No Garlene Apperson: Provided on Clinical Summary of Care: 03/19/2017 Form Type Recipient Paper Patient LP Electronic Signature(s) Signed: 03/23/2017 10:13:53 AM By: Gwenlyn Perking Entered By: Gwenlyn Perking on 03/19/2017 10:06:27 [name], [name] ([id number]) -------------------------------------------------------------------------------- Lower Extremity Assessment Details Patient Name: [name] Date of Service: 03/19/2017 9:15 AM Medical Record Number: [id number] Patient Account Number: [id number] Date of Birth/Sex: 1934/06/20 (83 y.o. Male) Treating RN: Curtis Sites Primary Care Hartley Wyke: Darreld Mclean Other Clinician: Referring Nou Chard: Darreld Mclean Treating Anacarolina Evelyn/Extender: Rudene Re in Treatment: 11 Vascular Assessment Pulses: Dorsalis Pedis Palpable: [Right:Yes] Posterior Tibial Extremity colors, hair growth, and conditions: Extremity Color: [Right:Hyperpigmented] Hair Growth on Extremity: [Right:No] Temperature of Extremity: [Right:Warm] Capillary Refill: [Right:< 3 seconds] Toe Nail Assessment Left: Right: Thick: Yes Discolored: Yes Deformed: Yes Improper Length and Hygiene: Yes Electronic Signature(s) Signed: 03/19/2017 5:58:36 PM By: Curtis Sites Entered By: Curtis Sites on 03/19/2017 09:47:27 [name], [name] ([id number]) -------------------------------------------------------------------------------- Multi Wound Chart Details Patient Name: [name] Date of Service: 03/19/2017 9:15 AM Medical Record Number: [id number] Patient Account Number: [id number] Date of Birth/Sex: 1933-09-15 (83 y.o.  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 N/A] Comorbid History: [1:N/A] [2:Hypertension, Peripheral N/A Venous Disease, Gout, Osteoarthritis] Date Acquired: [1:12/04/2016] [2:12/04/2016] [N/A:N/A] Weeks of Treatment: [1:11] [2:11] [N/A:N/A]  Wound Status: [1:Healed - Epithelialized] [2:Open] [N/A:N/A] Pending Amputation on Yes [2:Yes] [N/A:N/A] Presentation: Measurements L x W x D 0x0x0 [2:0.5x0.6x0.1] [N/A:N/A] (cm) Area (cm) : [1:0] [2:0.236] [N/A:N/A] Volume (cm) : [1:0] [2:0.024] [N/A:N/A] % Reduction in Area: [1:100.00%] [2:95.40%] [N/A:N/A] % Reduction in Volume: 100.00% [2:97.60%] [N/A:N/A] Classification: [1:Full Thickness With Exposed Support Structures] [2:Full Thickness With Exposed Support Structures] [N/A:N/A] Exudate Amount: [1:N/A] [2:Large] [N/A:N/A] Exudate Type: [1:N/A] [2:Serous] [N/A:N/A] Exudate Color: [1:N/A] [2:amber] [N/A:N/A] Wound Margin: [1:N/A] [2:Flat and Intact] [N/A:N/A] Granulation Amount: [1:N/A] [2:Large (67-100%)] [N/A:N/A] Granulation Quality: [1:N/A] [2:Red] [N/A:N/A] Necrotic Amount: [1:N/A] [2:None Present (0%)] [N/A:N/A] Epithelialization: [1:N/A] [2:None] [N/A:N/A] Periwound Skin Texture: No Abnormalities Noted [N/A:N/A] Excoriation: No Induration: No Callus: No Crepitus: No Rash: No Scarring: No Periwound Skin No Abnormalities Noted Maceration: No  N/A Moisture: Dry/Scaly: No Periwound Skin Color: No Abnormalities Noted Atrophie Blanche: No N/A Cyanosis: No Ecchymosis: No Erythema: No Hemosiderin Staining: No Mottled: No Pallor: No Rubor: No Temperature: N/A No Abnormality N/A Tenderness on No Yes N/A Palpation: Wound Preparation: N/A Ulcer Cleansing: N/A Rinsed/Irrigated with Saline Topical Anesthetic Applied: Other: lidocaine 4% Treatment Notes Electronic Signature(s) Signed: 03/19/2017 5:02:50 PM By: Evlyn Kanner MD, FACS Entered By: Evlyn Kanner on 03/19/2017 09:57:18 [name], [name] ([id number]) -------------------------------------------------------------------------------- Multi-Disciplinary Care Plan Details Patient Name: [name] Date of Service: 03/19/2017 9:15 AM Medical Record Number: [id number] Patient Account Number: [id number] Date of Birth/Sex: April 18, 1934 (83 y.o. Male) Treating RN: Curtis Sites Primary Care Temeca Somma: Darreld Mclean Other Clinician: Referring Latiesha Harada: Darreld Mclean Treating Dwight Adamczak/Extender: Rudene Re in Treatment: 11 Active Inactive ` Abuse / Safety / Falls / Self Care Management Nursing Diagnoses: Potential for falls Goals: Patient will not experience any injury related to falls Date Initiated: 12/30/2016 Target Resolution Date: 02/27/2017 Goal Status: Active Interventions: Assess fall risk on admission and as needed Notes: ` Orientation to the Wound Care Program Nursing Diagnoses: Knowledge deficit related to the wound healing center program Goals: Patient/caregiver will verbalize understanding of the Wound Healing Center Program Date Initiated: 12/30/2016 Target Resolution Date: 02/27/2017 Goal Status: Active Interventions: Provide education on orientation to the wound center Notes: ` Wound/Skin Impairment Nursing Diagnoses: Impaired tissue integrity Ashraf, [name] ([id number]) Goals: Ulcer/skin breakdown will have a volume reduction of 30% by  week 4 Date Initiated: 12/30/2016 Target Resolution Date: 02/27/2017 Goal Status: Active Ulcer/skin breakdown will have a volume reduction of 50% by week 8 Date Initiated: 12/30/2016 Target Resolution Date: 02/27/2017 Goal Status: Active Ulcer/skin breakdown will have a volume reduction of 80% by week 12 Date Initiated: 12/30/2016 Target Resolution Date: 02/27/2017 Goal Status: Active Ulcer/skin breakdown will heal within 14 weeks Date Initiated: 12/30/2016 Target Resolution Date: 02/27/2017 Goal Status: Active Interventions: Assess patient/caregiver ability to obtain necessary supplies Assess patient/caregiver ability to perform ulcer/skin care regimen upon admission and as needed Assess ulceration(s) every visit Notes: Electronic Signature(s) Signed: 03/19/2017 5:58:36 PM By: Curtis Sites Entered By: Curtis Sites on 03/19/2017 09:47:35 Rung, [name] ([id number]) -------------------------------------------------------------------------------- Pain Assessment Details Patient Name: [name] Date of Service: 03/19/2017 9:15 AM Medical Record Number: [id number] Patient Account Number: [id number] Date of Birth/Sex: 07/03/1934 (83 y.o. Male) Treating RN: Curtis Sites Primary Care Deondra Wigger: Darreld Mclean Other Clinician: Referring Parys Elenbaas: Darreld Mclean Treating Gagandeep Kossman/Extender: Rudene Re in Treatment: 11 Active Problems Location of Pain Severity and Description of Pain Patient Has Paino Yes Site Locations Pain Location: Generalized Pain Rate the pain. Current Pain Level: 6 Pain Management and Medication Current Pain Management: Notes pt just states his head hurts came in with hematoma  on right eye and right forehead Electronic Signature(s) Signed: 03/19/2017 5:58:36 PM By: Curtis Sites Entered By: Curtis Sites on 03/19/2017 09:42:09 [name], [name]  ([id number]) -------------------------------------------------------------------------------- Patient/Caregiver Education Details Patient Name: [name] Date of Service: 03/19/2017 9:15 AM Medical Record Number: [id number] Patient Account Number: [id number] Date of Birth/Gender: [birth year] (83 y.o. Male) Treating RN: Curtis Sites Primary Care Physician: Darreld Mclean Other Clinician: Referring Physician: Darreld Mclean Treating Physician/Extender: Rudene Re in Treatment: 11 Education Assessment Education Provided To: Caregiver SNF nurses via written orders Education Topics Provided Wound/Skin Impairment: Handouts: Other: wound care orders Methods: Printed Electronic Signature(s) Signed: 03/19/2017 5:58:36 PM By: Curtis Sites Entered By: Curtis Sites on 03/19/2017 09:54:58 [name], [name] ([id number]) -------------------------------------------------------------------------------- Wound Assessment Details Patient Name: [name] Date of Service: 03/19/2017 9:15 AM Medical Record Number: [id number] Patient Account Number: [id number] Date of Birth/Sex: Mar 14, 1934 (83 y.o. Male) Treating RN: Curtis Sites Primary Care Justice Milliron: Darreld Mclean Other Clinician: Referring Destine Ambroise: Darreld Mclean Treating Derrick Tiegs/Extender: Rudene Re in Treatment: 11 Wound Status Wound Number: 1 Primary Etiology: Arterial Insufficiency Ulcer Wound Location: Right, Lateral Ankle Wound Status: Healed - Epithelialized Wounding Event: Gradually Appeared Date Acquired: 12/04/2016 Weeks Of Treatment: 11 Clustered Wound: No Pending Amputation On Presentation Photos Photo Uploaded By: Elliot Gurney, BSN, RN, CWS, Kim on 03/19/2017 17:37:48 Wound Measurements Length: (cm) 0 % Reducti Width: (cm) 0 % Reducti Depth: (cm) 0 Area: (cm) 0 Volume: (cm) 0 on in Area: 100% on in Volume: 100% Wound Description Full Thickness With Exposed Support Classification: Structures Periwound  Skin Texture Texture Color No Abnormalities Noted: No No Abnormalities Noted: No Moisture No Abnormalities Noted: No Electronic Signature(s) [place], [place] ([id number]) Signed: 03/19/2017 5:58:36 PM By: Curtis Sites Entered By: Curtis Sites on 03/19/2017 09:46:33 Ertle, [name] ([id number]) -------------------------------------------------------------------------------- Wound Assessment Details Patient Name: [name], [name] Date of Service: 03/19/2017 9:15 AM Medical Record Number: [id number] Patient Account Number: [id number] Date of Birth/Sex: [birth year] (83 y.o. Male) Treating RN: Curtis Sites Primary Care Bayard More: Darreld Mclean Other Clinician: Referring Mieko Kneebone: Darreld Mclean Treating Wang [place]/Extender: Rudene Re in Treatment: 11 Wound Status Wound Number: 2 Primary Arterial Insufficiency Ulcer Etiology: Wound Location: Right Malleolus Wound Open Wounding Event: Gradually Appeared Status: Date Acquired: 12/04/2016 Comorbid Hypertension, Peripheral Venous Weeks Of Treatment: 11 History: Disease, Gout, Osteoarthritis Clustered Wound: No Pending Amputation On Presentation Photos Photo Uploaded By: Elliot Gurney, BSN, RN, CWS, Kim on 03/19/2017 17:37:49 Wound Measurements Length: (cm) 0.5 % Reduction in Width: (cm) 0.6 % Reduction in Depth: (cm) 0.1 Epithelializati Area: (cm) 0.236 Tunneling: Volume: (cm) 0.024 Undermining: Area: 95.4% Volume: 97.6% on: None No No Wound Description Full Thickness With Exposed Foul Odor After Classification: Support Structures Slough/Fibrino Wound Margin: Flat and Intact Exudate Large Amount: Exudate Type: Serous Exudate Color: amber Cleansing: No Yes Wound Bed Granulation Amount: Large (67-100%) Exposed Structure Granulation Quality: Red Fascia Exposed: No [name], [name] ([id number]) Necrotic Amount: None Present (0%) Fat Layer (Subcutaneous Tissue) Exposed: Yes Tendon Exposed: No Muscle Exposed: No Joint  Exposed: No Bone Exposed: No Periwound Skin Texture Texture Color No Abnormalities Noted: No No Abnormalities Noted: No Callus: No Atrophie Blanche: No Crepitus: No Cyanosis: No Excoriation: No Ecchymosis: No Induration: No Erythema: No Rash: No Hemosiderin Staining: No Scarring: No Mottled: No Pallor: No Moisture Rubor: No No Abnormalities Noted: No Dry / Scaly: No Temperature / Pain Maceration: No Temperature: No Abnormality Tenderness on Palpation: Yes Wound Preparation Ulcer Cleansing: Rinsed/Irrigated with Saline Topical Anesthetic Applied: Other: lidocaine 4%, Treatment Notes Wound #2 (Right Malleolus) 1. Cleansed with: Clean  wound with Normal Saline 2. Anesthetic Topical Lidocaine 4% cream to wound bed prior to debridement 4. Dressing Applied: Hydrafera Blue 5. Secondary Dressing Applied Bordered Foam Dressing Notes netting Electronic Signature(s) Signed: 03/19/2017 5:58:36 PM By: Curtis Sites Entered By: Curtis Sites on 03/19/2017 09:47:00 Mchale, [name] ([id number]) -------------------------------------------------------------------------------- Vitals Details Patient Name: [name] Date of Service: 03/19/2017 9:15 AM Medical Record Number: [id number] Patient Account Number: [id number] Date of Birth/Sex: 1934-02-15 (83 y.o. Male) Treating RN: Curtis Sites Primary Care Tamieka Rancourt: Darreld Mclean Other Clinician: Referring Juanna Pudlo: Darreld Mclean Treating Estus Krakowski/Extender: Rudene Re in Treatment: 11 Vital Signs Time Taken: 09:42 Temperature (F): 97.6 Pulse (bpm): 82 Respiratory Rate (breaths/min): 16 Blood Pressure (mmHg): 150/79 Reference Range: 80 - 120 mg / dl Electronic Signature(s) Signed: 03/19/2017 5:58:36 PM By: Curtis Sites Entered By: Curtis Sites on 03/19/2017 09:42:32

## 2017-03-26 ENCOUNTER — Encounter: Payer: Medicare HMO | Admitting: Surgery

## 2017-03-26 DIAGNOSIS — I70235 Atherosclerosis of native arteries of right leg with ulceration of other part of foot: Secondary | ICD-10-CM | POA: Diagnosis not present

## 2017-03-29 NOTE — Progress Notes (Signed)
Campus, Anderson ([id number]) Visit Report for 03/26/2017 Chief Complaint Document Details Patient Name: [name], [name] Date of Service: 03/26/2017 12:30 PM Medical Record Number: [id number] Patient Account Number: [id number] Date of Birth/Sex: [birth year] (83 y.o. Male) Treating RN: Montey Hora Primary Care Provider: Delight Stare Other Clinician: Referring Provider: Delight Stare Treating Provider/Extender: Frann Rider in Treatment: 12 Information Obtained from: Patient Chief Complaint 12/30/16; patient arrives from [place] unaccompanied. He has 3 open wounds on the right foot and 2 threatened areas Electronic Signature(s) Signed: 03/26/2017 2:14:22 PM By: Christin Fudge MD, FACS Entered By: Christin Fudge on 03/26/2017 13:00:19 [name], [name] Maudlin ([id number]) -------------------------------------------------------------------------------- HPI Details Patient Name: [name] Date of Service: 03/26/2017 12:30 PM Medical Record Number: [id number] Patient Account Number: [id number] Date of Birth/Sex: 05-03-1934 (83 y.o. Male) Treating RN: Montey Hora Primary Care Provider: Delight Stare Other Clinician: Referring Provider: Delight Stare Treating Provider/Extender: Frann Rider in Treatment: 12 History of Present Illness HPI Description: 12/30/16; this is an 83 year old man who comes from [facility] skilled facility. He comes today unaccompanied and without any history. Looking through [facility] link I am able to see that he was admitted to hospital from 5/25 through 12/04/16 at that point with dehydration, severe hyponatremia acute kidney injury. He was treated with IV fluid replacement including 3% normal saline. Nowhere in his discharge summary did I see any reference to wounds on his feet and is mentioned he does not come in with any history. He does come in with a pack of cigarettes therefore I'm assuming he is a smoker. He is not a listed diabetic.  There is some reference in his notes that he has a DSS Education officer, museum is his guardian I'm not completely sure if that is true in any case the patient has some degree of dementia although he is able to tell me that he is 82 years old came from [place]. He is not able to give a history of these wounds. Our intake nurse notes maggots in the right third toe. Beyond this he has an area over the right lateral malleolus on the right lateral foot. He also has to threatened areas on the base of the right fifth metatarsal and on the lateral aspect of the right fifth metatarsal head. We are completely unable to do ABIs in the clinic largely because when we inflated the cuff he seemed to withdraw the foot. 01/05/17; the patient was seen urgently by Dr. Delana Meyer of vein and vascular. He agreed with our assessment of critical limb ischemia and set him up for an angiogram. Unfortunately the patient was not transported for preoperative evaluation therefore the angiogram has been canceled. I am not exactly sure if and when this is going to be rescheduled. The patient's wounds look about the same, right lateral ankle right lateral foot third toe. There is also concerning areas over the fifth met head and base of the fifth metatarsal which are not open but are covered by a dark eschar logorrhea and painful. We have been using silver alginate and x-ray of the right foot done at the facility showed no definite radiographic evidence of acute fracture or dislocation there was an old avulsion fracture of the proximal phalanx fifth digit as well as old fractures of the base of the proximal phalanx of the fifth digit. There is no comment about osteomyelitis 01/16/17 I have patient's record from his operative report from [facility] with Dr. Delana Meyer were fortunately patient underwent a successful angioplasty in regard to  his right lower extremity. The anterior tibial in-line flow was improved with less than  5% residual stenosis. Angioplasty of the SFA and popliteal arteries also yielded excellent results in post procedure testing with less than 10% residual stenosis. Overall patient appears to have done very well with this procedure. Hopefully this will translate into better wound healing in regard to his right lower extremity wounds. 01/22/2017 -- the patient comes with no dressings appropriate on his right lower extremity and the patient is rather non-verbal and coherent. He has no nursing or caregiver 02/12/17 on evaluation today patient appears to be doing a little better in regard to his wounds and the [name], TITAN ([id number]) dressings were in place at all for wound locations as they should be. With that being said he has some discomfort noted with cleansing of the wounds. No fevers, chills, nausea, or vomiting noted at this time. 02/19/17 on evaluation today patient appears to be doing well in regard to his wounds. All are measuring a little bit smaller at this time. He has been tolerating the dressing changes without complication. He does appear to be quite a bit drowsy today when questioned he tells me that he didn't sleep well last night due to arthritis. Otherwise he does not describe any pain at this time although she really did not talk much. No fevers, chills, nausea, or vomiting noted at this time. 02/26/2017 -- we have been having problems having the patient come here without any caregivers and today his caseworker was here and we had a detailed discussion with her regarding his treatment plan and the lack of coordinated care at his facility. She will look into this. 03/19/2017 -- patient comes along with his caregiver and of note he has a large swelling in the right periorbital region with ecchymosis and raccoon eyes. The caregiver is not sure whether he has been worked up for this and is not sure when this happened. Electronic Signature(s) Signed: 03/26/2017 2:14:22 PM By: Christin Fudge MD, FACS Entered By: Christin Fudge on 03/26/2017 13:00:43 [name], [name] Maudlin ([id number]) -------------------------------------------------------------------------------- Physical Exam Details Patient Name: [name] Date of Service: 03/26/2017 12:30 PM Medical Record Number: [id number] Patient Account Number: [id number] Date of Birth/Sex: 1934-06-13 (83 y.o. Male) Treating RN: Montey Hora Primary Care Provider: Delight Stare Other Clinician: Referring Provider: Delight Stare Treating Provider/Extender: Frann Rider in Treatment: 12 Constitutional . Pulse regular. Respirations normal and unlabored. Afebrile. . Eyes Nonicteric. Reactive to light. Ears, Nose, Mouth, and Throat Lips, teeth, and gums WNL.Marland Kitchen Moist mucosa without lesions. Neck supple and nontender. No palpable supraclavicular or cervical adenopathy. Normal sized without goiter. Respiratory WNL. No retractions.. Cardiovascular Pedal Pulses WNL. No clubbing, cyanosis or edema. Lymphatic No adneopathy. No adenopathy. No adenopathy. Musculoskeletal Adexa without tenderness or enlargement.. Digits and nails w/o clubbing, cyanosis, infection, petechiae, ischemia, or inflammatory conditions.. Integumentary (Hair, Skin) No suspicious lesions. No crepitus or fluctuance. No peri-wound warmth or erythema. No masses.Marland Kitchen Psychiatric Judgement and insight Intact.. No evidence of depression, anxiety, or agitation.. Notes the only wound {on the right lateral ankle region, and after removing the eschar gently there is a very superficial minimal area open Electronic Signature(s) Signed: 03/26/2017 2:14:22 PM By: Christin Fudge MD, FACS Entered By: Christin Fudge on 03/26/2017 13:02:18 Obeso, [name] Maudlin ([id number]) -------------------------------------------------------------------------------- Physician Orders Details Patient Name: [name] Date of Service: 03/26/2017 12:30 PM Medical Record Number: [id number] Patient  Account Number: [id number] Date of Birth/Sex: 03-17-1934 (83 y.o. Male) Treating RN: Ahmed Prima Primary Care Provider: Delight Stare  Other Clinician: Referring Provider: Delight Stare Treating Provider/Extender: Frann Rider in Treatment: 12 Verbal / Phone Orders: No Diagnosis Coding Wound Cleansing Wound #2 Right Malleolus o Clean wound with Normal Saline. o May Shower, gently pat wound dry prior to applying new dressing. Anesthetic Wound #2 Right Malleolus o Topical Lidocaine 4% cream applied to wound bed prior to debridement - In clinic only Secondary Dressing Wound #2 Right Malleolus o Boardered Foam Dressing Dressing Change Frequency Wound #2 Right Malleolus o Change dressing every day. Follow-up Appointments Wound #2 Right Malleolus o Return Appointment in 1 week. Additional Orders / Instructions Wound #2 Right Malleolus o Stop Smoking o Increase protein intake. Electronic Signature(s) Signed: 03/26/2017 2:14:22 PM By: Christin Fudge MD, FACS Signed: 03/27/2017 5:11:04 PM By: Alric Quan Entered By: Alric Quan on 03/26/2017 12:50:48 Stolar, [name] Maudlin ([id number]) -------------------------------------------------------------------------------- Problem List Details Patient Name: Kearn, [name] Maudlin Date of Service: 03/26/2017 12:30 PM Medical Record Number: [id number] Patient Account Number: [id number] Date of Birth/Sex: 03-11-1934 (83 y.o. 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/30/2016 Yes ulceration of other part of foot I70.245 Atherosclerosis of native arteries of left leg with ulceration 12/30/2016 Yes of other part of foot F17.218 Nicotine dependence, cigarettes, with other nicotine- 01/22/2017 Yes induced disorders Inactive Problems Resolved Problems Electronic Signature(s) Signed: 03/26/2017 2:14:22 PM By: Christin Fudge MD, FACS Entered By: Christin Fudge on 03/26/2017 13:00:01 Sebastiani, [name] Maudlin ([id number]) -------------------------------------------------------------------------------- Progress Note Details Patient Name: [name] Date of Service: 03/26/2017 12:30 PM Medical Record Number: [id number] Patient Account Number: [id number] Date of Birth/Sex: 1933-09-06 (83 y.o. Male) Treating RN: Montey Hora Primary Care Provider: Delight Stare Other Clinician: Referring Provider: Delight Stare Treating Provider/Extender: Frann Rider in Treatment: 12 Subjective Chief Complaint Information obtained from Patient 12/30/16; patient arrives from [place] unaccompanied. He has 3 open wounds on the right foot and 2 threatened areas History of Present Illness (HPI) 12/30/16; this is an 83 year old man who comes from [place] skilled facility. He comes today unaccompanied and without any history. Looking through [facility] link I am able to see that he was admitted to hospital from 5/25 through 12/04/16 at that point with dehydration, severe hyponatremia acute kidney injury. He was treated with IV fluid replacement including 3% normal saline. Nowhere in his discharge summary did I see any reference to wounds on his feet and is mentioned he does not come in with any history. He does come in with a pack of cigarettes therefore I'm assuming he is a smoker. He is not a listed diabetic. There is some reference in his notes that  he has a DSS Education officer, museum is his guardian I'm not completely sure if that is true in any case the patient has some degree of dementia although he is able to tell me that he is 83 years old came from [place]. He is not able to give a history of these wounds. Our intake nurse notes maggots in the right third toe. Beyond this he has an area over the right lateral malleolus  on the right lateral foot. He also has to threatened areas on the base of the right fifth metatarsal and on the lateral aspect of the right fifth metatarsal head. We are completely unable to do ABIs in the clinic largely because when we inflated the cuff he seemed to withdraw the foot. 01/05/17; the patient was seen urgently by Dr. Delana Meyer of vein and vascular. He agreed with our assessment of critical limb ischemia and set him up for an angiogram. Unfortunately the patient was not transported for preoperative evaluation therefore the angiogram has been canceled. I am not exactly sure if and when this is going to be rescheduled. The patient's wounds look about the same, right lateral ankle right lateral foot third toe. There is also concerning areas over the fifth met head and base of the fifth metatarsal which are not open but are covered by a dark eschar logorrhea and painful. We have been using silver alginate and x-ray of the right foot done at the facility showed no definite radiographic evidence of acute fracture or dislocation there was an old avulsion fracture of the proximal phalanx fifth digit as well as old fractures of the base of the proximal phalanx of the fifth digit. There is no comment about osteomyelitis 01/16/17 I have patient's record from his operative report from [facility] with Dr. Delana Meyer were fortunately patient underwent a successful angioplasty in regard to his right lower extremity. The anterior tibial in-line flow was improved with less than 5% residual stenosis. Angioplasty of the  SFA and popliteal arteries also yielded excellent results in post procedure testing with less than 10% residual Muench, [name] Maudlin ([id number]) stenosis. Overall patient appears to have done very well with this procedure. Hopefully this will translate into better wound healing in regard to his right lower extremity wounds. 01/22/2017 -- the patient comes with no dressings appropriate on his right lower extremity and the patient is rather non-verbal and coherent. He has no nursing or caregiver 02/12/17 on evaluation today patient appears to be doing a little better in regard to his wounds and the dressings were in place at all for wound locations as they should be. With that being said he has some discomfort noted with cleansing of the wounds. No fevers, chills, nausea, or vomiting noted at this time. 02/19/17 on evaluation today patient appears to be doing well in regard to his wounds. All are measuring a little bit smaller at this time. He has been tolerating the dressing changes without complication. He does appear to be quite a bit drowsy today when questioned he tells me that he didn't sleep well last night due to arthritis. Otherwise he does not describe any pain at this time although she really did not talk much. No fevers, chills, nausea, or vomiting noted at this time. 02/26/2017 -- we have been having problems having the patient come here without any caregivers and today his caseworker was here and we had a detailed discussion with her regarding his treatment plan and the lack of coordinated care at his facility. She will look into this. 03/19/2017 -- patient comes along with his caregiver and of note he has a large swelling in the right periorbital region with ecchymosis and raccoon eyes. The caregiver is not sure whether he has been worked up for this and is not sure when this happened. Objective Constitutional Pulse regular. Respirations normal and unlabored. Afebrile. Vitals Time Taken:  12:37 PM, Temperature: 98.0 F, Pulse: 73 bpm, Respiratory Rate: 16 breaths/min, Blood  Pressure: 96/60 mmHg. Eyes Nonicteric. Reactive to light. Ears, Nose, Mouth, and Throat Lips, teeth, and gums WNL.Marland Kitchen Moist mucosa without lesions. Neck supple and nontender. No palpable supraclavicular or cervical adenopathy. Normal sized without goiter. Raygoza, [name] Maudlin ([id number]) Respiratory WNL. No retractions.. Cardiovascular Pedal Pulses WNL. No clubbing, cyanosis or edema. Lymphatic No adneopathy. No adenopathy. No adenopathy. Musculoskeletal Adexa without tenderness or enlargement.. Digits and nails w/o clubbing, cyanosis, infection, petechiae, ischemia, or inflammatory conditions.Marland Kitchen Psychiatric Judgement and insight Intact.. No evidence of depression, anxiety, or agitation.. General Notes: the only wound {on the right lateral ankle region, and after removing the eschar gently there is a very superficial minimal area open Integumentary (Hair, Skin) No suspicious lesions. No crepitus or fluctuance. No peri-wound warmth or erythema. No masses.. Wound #2 status is Open. Original cause of wound was Gradually Appeared. The wound is located on the Right Malleolus. The wound measures 0.3cm length x 0.5cm width x 0.1cm depth; 0.118cm^2 area and 0.012cm^3 volume. There is Fat Layer (Subcutaneous Tissue) Exposed exposed. There is no tunneling or undermining noted. There is a large amount of serous drainage noted. The wound margin is flat and intact. There is large (67-100%) red granulation within the wound bed. There is no necrotic tissue within the wound bed. The periwound skin appearance did not exhibit: Callus, Crepitus, Excoriation, Induration, Rash, Scarring, Dry/Scaly, Maceration, Atrophie Blanche, Cyanosis, Ecchymosis, Hemosiderin Staining, Mottled, Pallor, Rubor, Erythema. Periwound temperature was noted as No Abnormality. The periwound has tenderness on palpation. 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 May Shower, gently pat wound dry prior to applying new dressing. Anesthetic: Wound #2 Right Malleolus: Topical Lidocaine 4% cream applied to wound bed prior to debridement - In clinic only Secondary Dressing: Wound #2 Right Malleolus: Boardered Foam Dressing Dressing Change Frequency: Wound #2 Right Malleolus: Change dressing every day. Follow-up Appointments: Wound #2 Right Malleolus: Return Appointment in 1 week. Additional Orders / Instructions: Wound #2 Right Malleolus: Stop Smoking Increase protein intake. After review of the patient's wounds today, I have recommended: 1. we use bordered foam over all these areas and offload them appropriately. 2. discusses care in detail with his caregiver who is at the bedside today 3. I anticipate discharge by next week Electronic Signature(s) Signed: 03/26/2017 2:14:22 PM By: Christin Fudge MD, FACS Entered By: Christin Fudge on 03/26/2017 13:03:18 Stocking, [name] Maudlin ([id number]) -------------------------------------------------------------------------------- SuperBill Details Patient Name: [name] Date of Service: 03/26/2017 Medical Record Number: [id number] Patient Account Number: [id number] Date of Birth/Sex: [birth year] (83 y.o. 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 I70.235 Atherosclerosis of native arteries  of right leg with ulceration of other part of foot I70.245 Atherosclerosis of native arteries of left leg with ulceration of other part of foot F17.218 Nicotine dependence, cigarettes, with other nicotine-induced disorders Facility Procedures CPT4 Code: [id number] Description: 99214 - WOUND CARE VISIT-LEV 4 EST PT Modifier: Quantity: 1 Physician Procedures CPT4: Description Modifier Quantity Code [id number] 99213 - WC PHYS LEVEL 3 - EST PT 1 ICD-10 Description Diagnosis 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

## 2017-03-30 NOTE — Progress Notes (Signed)
[name] ([id number]) Visit Report for 03/26/2017 Arrival Information Details Patient Name: [name], [name] Date of Service: 03/26/2017 12:30 PM Medical Record Number: [id number] Patient Account Number: [id number] Date of Birth/Sex: September 28, 1933 (83 y.o. Male) Treating RN: [name] Primary Care [name]: [name] Other Clinician: Referring [name]: [name] Treating Ariyel Jeangilles/Extender: [name] in Treatment: 12 Visit Information History Since Last Visit All ordered tests and consults were completed: No Patient Arrived: Wheel Chair Added or deleted any medications: No Arrival Time: 12:36 Any new allergies or adverse reactions: No Accompanied By: caregiver Had a fall or experienced change in No activities of daily living that may affect Transfer Assistance: Other risk of falls: Patient Identification Verified: Yes Signs or symptoms of abuse/neglect since last No Secondary Verification Process Yes visito Completed: Hospitalized since last visit: No Patient Requires Transmission-Based No Has Dressing in Place as Prescribed: Yes Precautions: Pain Present Now: No Patient Has Alerts: Yes Patient Alerts: ABI inaudible Electronic Signature(s) Signed: 03/27/2017 5:11:04 PM By: [name] Entered By: [name] on 03/26/2017 12:37:52 Bunkley, [name] ([id number]) -------------------------------------------------------------------------------- Clinic Level of Care Assessment Details Patient Name: [name] Date of Service: 03/26/2017 12:30 PM Medical Record Number: [id number] Patient Account Number: [id number] Date of Birth/Sex: [birth year] (83 y.o. Male) Treating RN: [name] Primary Care Sheng Pritz: [name] Other Clinician: Referring [name]: [name] Treating Elea Holtzclaw/Extender: [name] in Treatment: 12 Clinic Level of Care Assessment Items TOOL 4 Quantity Score X - Use when only an EandM is performed on FOLLOW-UP  visit 1 0 ASSESSMENTS - Nursing Assessment / Reassessment X - Reassessment of Co-morbidities (includes updates in patient status) 1 10 X - Reassessment of Adherence to Treatment Plan 1 5 ASSESSMENTS - Wound and Skin Assessment / Reassessment X - Simple Wound Assessment / Reassessment - one wound 1 5  - Complex Wound Assessment / Reassessment - multiple wounds 0  - Dermatologic / Skin Assessment (not related to wound area) 0 ASSESSMENTS - Focused Assessment  - Circumferential Edema Measurements - multi extremities 0  - Nutritional Assessment / Counseling / Intervention 0  - Lower Extremity Assessment (monofilament, tuning fork, pulses) 0  - Peripheral Arterial Disease Assessment (using hand held doppler) 0 ASSESSMENTS - Ostomy and/or Continence Assessment and Care  - Incontinence Assessment and Management 0  - Ostomy Care Assessment and Management (repouching, etc.) 0 PROCESS - Coordination of Care  - Simple Patient / Family Education for ongoing care 0 X - Complex (extensive) Patient / Family Education for ongoing care 1 20 X - Staff obtains Chiropractor, Records, Test Results / Process Orders 1 10 X - Staff telephones HHA, Nursing Homes / Clarify orders / etc 1 10  - Routine Transfer to another Facility (non-emergent condition) 0 [name] ([id number])  - Routine Hospital Admission (non-emergent condition) 0  - New Admissions / Manufacturing engineer / Ordering NPWT, Apligraf, etc. 0  - Emergency Hospital Admission (emergent condition) 0 X - Simple Discharge Coordination 1 10  - Complex (extensive) Discharge Coordination 0 PROCESS - Special Needs  - Pediatric / Minor Patient Management 0  - Isolation Patient Management 0  - Hearing / Language / Visual special needs 0  - Assessment of Community assistance (transportation, D/C planning, etc.) 0  - Additional assistance / Altered mentation 0  - Support Surface(s) Assessment (bed, cushion,  seat, etc.) 0 INTERVENTIONS - Wound Cleansing / Measurement X - Simple Wound Cleansing - one wound 1 5  - Complex Wound Cleansing - multiple wounds 0 X - Wound Imaging (  photographs - any number of wounds) 1 5  - Wound Tracing (instead of photographs) 0 X - Simple Wound Measurement - one wound 1 5  - Complex Wound Measurement - multiple wounds 0 INTERVENTIONS - Wound Dressings X - Small Wound Dressing one or multiple wounds 1 10  - Medium Wound Dressing one or multiple wounds 0  - Large Wound Dressing one or multiple wounds 0  - Application of Medications - topical 0 X - Application of Medications - injection 1 10 INTERVENTIONS - Miscellaneous  - External ear exam 0 [name] ([id number])  - Specimen Collection (cultures, biopsies, blood, body fluids, etc.) 0  - Specimen(s) / Culture(s) sent or taken to Lab for analysis 0 X - Patient Transfer (multiple staff / Nurse, adult / Similar devices) 1 10  - Simple Staple / Suture removal (25 or less) 0  - Complex Staple / Suture removal (26 or more) 0  - Hypo / Hyperglycemic Management (close monitor of Blood Glucose) 0  - Ankle / Brachial Index (ABI) - do not check if billed separately 0 X - Vital Signs 1 5 Has the patient been seen at the hospital within the last three years: Yes Total Score: 120 Level Of Care: New/Established - Level 4 Electronic Signature(s) Signed: 03/27/2017 5:11:04 PM By: [name] Entered By: [name] on 03/26/2017 13:06:15 [name] ([id number]) -------------------------------------------------------------------------------- Encounter Discharge Information Details Patient Name: [name] Date of Service: 03/26/2017 12:30 PM Medical Record Number: [id number] Patient Account Number: [id number] Date of Birth/Sex: [birth year] (83 y.o. Male) Treating RN: [name] Primary Care Calandria Mullings: [name] Other Clinician: Referring Bassheva Flury: [name] Treating  Lyfe Reihl/Extender: [name] in Treatment: 12 Encounter Discharge Information Items Discharge Pain Level: 0 Discharge Condition: Stable Ambulatory Status: Wheelchair Discharge Destination: Nursing Home Transportation: Private Auto Accompanied By: social worker Schedule Follow-up Appointment: Yes Medication Reconciliation completed and provided to Patient/Care No Peyson Postema: Provided on Clinical Summary of Care: 03/26/2017 Form Type Recipient Paper Patient LP Electronic Signature(s) Signed: 03/30/2017 9:26:52 AM By: Gwenlyn Perking Entered By: Gwenlyn Perking on 03/26/2017 12:58:53 Balboa, [name] ([id number]) -------------------------------------------------------------------------------- Lower Extremity Assessment Details Patient Name: [name] Date of Service: 03/26/2017 12:30 PM Medical Record Number: [id number] Patient Account Number: [id number] Date of Birth/Sex: Nov 10, 1933 (83 y.o. Male) Treating RN: [name] Primary Care Samule Life: [name] Other Clinician: Referring Khayree Delellis: [name] Treating Adriell Polansky/Extender: [name] in Treatment: 12 Vascular Assessment Pulses: Dorsalis Pedis Palpable: [Right:Yes] Posterior Tibial Extremity colors, hair growth, and conditions: Extremity Color: [Right:Hyperpigmented] Hair Growth on Extremity: [Right:No] Temperature of Extremity: [Right:Warm] Capillary Refill: [Right:< 3 seconds] Toe Nail Assessment Left: Right: Thick: Yes Discolored: Yes Deformed: Yes Improper Length and Hygiene: Yes Electronic Signature(s) Signed: 03/27/2017 5:11:04 PM By: [name] Entered By: [name] on 03/26/2017 12:47:01 Furches, [name] ([id number]) -------------------------------------------------------------------------------- Multi Wound Chart Details Patient Name: [name] Date of Service: 03/26/2017 12:30 PM Medical Record Number: [id number] Patient Account Number: [id number] Date of  Birth/Sex: 1934-04-23 (83 y.o. 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] Comorbid History: [2:Hypertension, Peripheral N/A Venous Disease, Gout, Osteoarthritis] Date Acquired: [2:12/04/2016] [N/A:N/A] Weeks of Treatment: [2:12] [N/A:N/A] Wound Status: [2:Open] [N/A:N/A] Pending Amputation on Yes [N/A:N/A] Presentation:  Measurements L x W x D 0.3x0.5x0.1 [N/A:N/A] (cm) Area (cm) : [2:0.118] [N/A:N/A] Volume (cm) : [2:0.012] [N/A:N/A] % Reduction in Area: [2:97.70%] [N/A:N/A] % Reduction in Volume: 98.80% [N/A:N/A] Classification: [2:Full Thickness With Exposed Support Structures] [N/A:N/A] Exudate Amount: [2:Large] [N/A:N/A] Exudate Type: [2:Serous] [N/A:N/A] Exudate Color: [2:amber] [N/A:N/A] Wound Margin: [2:Flat and Intact] [N/A:N/A] Granulation Amount: [2:Large (67-100%)] [N/A:N/A] Granulation Quality: [2:Red] [N/A:N/A] Necrotic Amount: [2:None Present (0%)] [N/A:N/A] Exposed Structures: [2:Fat Layer (Subcutaneous N/A Tissue) Exposed: Yes] Fascia: No Tendon: No Muscle: No Joint: No Bone: No Epithelialization: Large (67-100%) N/A N/A Periwound Skin Texture: Excoriation: No N/A N/A Induration: No Callus: No Crepitus: No Rash: No Scarring: No Periwound Skin Maceration: No N/A N/A Moisture: Dry/Scaly: No Periwound Skin Color: Atrophie Blanche: No N/A N/A Cyanosis: No Ecchymosis: No Erythema: No Hemosiderin Staining: No Mottled: No Pallor: No Rubor: No Temperature: No  Abnormality N/A N/A Tenderness on Yes N/A N/A Palpation: Wound Preparation: Ulcer Cleansing: N/A N/A Rinsed/Irrigated with Saline Topical Anesthetic Applied: Other: lidocaine 4% Treatment Notes Wound #2 (Right Malleolus) 1. Cleansed with: Clean wound with Normal Saline 5. Secondary Dressing Applied Bordered Foam Dressing Notes netting Electronic Signature(s) Signed: 03/26/2017 2:14:22 PM By: Evlyn Kanner MD, FACS Entered By: Evlyn Kanner on 03/26/2017 13:00:08 Van, [name] ([id number]) -------------------------------------------------------------------------------- Multi-Disciplinary Care Plan Details Patient Name: [name] Date of Service: 03/26/2017 12:30 PM Medical Record Number: [id number] Patient Account Number: [id number] Date of Birth/Sex: [birth year] (83 y.o. Male) Treating RN: [name] Primary Care Chelcey Caputo: [name] Other Clinician: Referring Damondre Pfeifle: [name] Treating Tapanga Ottaway/Extender: [name] in Treatment: 12 Active Inactive ` Abuse / Safety / Falls / Self Care Management Nursing Diagnoses: Potential for falls Goals: Patient will not experience any injury related to falls Date Initiated: 12/30/2016 Target Resolution Date: 02/27/2017 Goal Status: Active Interventions: Assess fall risk on admission and as needed Notes: ` Orientation to the Wound Care Program Nursing Diagnoses: Knowledge deficit related to the wound healing center program Goals: Patient/caregiver will verbalize understanding of the Wound Healing Center Program Date Initiated: 12/30/2016 Target Resolution Date: 02/27/2017 Goal Status: Active Interventions: Provide education on orientation to the wound center Notes: ` Wound/Skin Impairment Nursing Diagnoses: Impaired tissue integrity Giesler, [name] ([id number]) Goals: Ulcer/skin breakdown will have a volume reduction of 30% by week 4 Date Initiated: 12/30/2016 Target Resolution Date: 02/27/2017 Goal  Status: Active Ulcer/skin breakdown will have a volume reduction of 50% by week 8 Date Initiated: 12/30/2016 Target Resolution Date: 02/27/2017 Goal Status: Active Ulcer/skin breakdown will have a volume reduction of 80% by week 12 Date Initiated: 12/30/2016 Target Resolution Date: 02/27/2017 Goal Status: Active Ulcer/skin breakdown will heal within 14 weeks Date Initiated: 12/30/2016 Target Resolution Date: 02/27/2017 Goal Status: Active Interventions: Assess patient/caregiver ability to obtain necessary supplies Assess patient/caregiver ability to perform ulcer/skin care regimen upon admission and as needed Assess ulceration(s) every visit Notes: Electronic Signature(s) Signed: 03/27/2017 5:11:04 PM By: [name] Entered By: [name] on 03/26/2017 12:47:26 Weinheimer, [name] ([id number]) -------------------------------------------------------------------------------- Pain Assessment Details Patient Name: [name] Date of Service: 03/26/2017 12:30 PM Medical Record Number: [id number] Patient Account Number: [id number] Date of Birth/Sex: 23-Feb-1934 (84 y.o. Male) Treating RN: [name] Primary Care Shawan Corella: [name] Other Clinician: Referring Phoebie Shad: [name] Treating Dymin Dingledine/Extender: [name] in Treatment: 12 Active Problems Location of Pain Severity and Description of Pain Patient Has Paino No Site Locations Pain Management and Medication Current Pain Management: Electronic Signature(s) Signed: 03/27/2017 5:11:04 PM By: [name] Entered By: [name] on 03/26/2017 12:37:57 Torre, [name] ([id number]) --------------------------------------------------------------------------------  Patient/Caregiver Education Details Patient Name: DAREK, EIFLER Date of Service: 03/26/2017 12:30 PM Medical Record Number: [id number] Patient Account Number: [id number] Date of Birth/Gender: 27-Feb-1934 (83 y.o. Male) Treating RN: [name] Primary Care Physician: [name] Other Clinician: Referring Physician: Darreld [name] Treating Physician/Extender: [name] in Treatment: 12 Education Assessment Education Provided To: Caregiver SNF nurses via written orders Education Topics Provided Wound/Skin Impairment: Handouts: Other: wound care orders Methods: Printed Electronic Signature(s) Signed: 03/27/2017 5:11:04 PM By: [name] Entered By: [name] on 03/26/2017 12:50:18 Longnecker, [name] ([id number]) -------------------------------------------------------------------------------- Wound Assessment Details Patient Name: [name] Date of Service: 03/26/2017 12:30 PM Medical Record Number: [id number] Patient Account Number: [id number] Date of Birth/Sex: Nov 09, 1933 (83 y.o. Male) Treating RN: [name] Primary Care Monroe Qin: [name] Other Clinician: Referring Jame Seelig: [name] Treating Jacek Colson/Extender: [name] in Treatment: 12 Wound Status Wound Number: 2 Primary Arterial Insufficiency Ulcer Etiology: Wound Location: Right Malleolus Wound Open Wounding Event: Gradually Appeared Status: Date Acquired: 12/04/2016 Comorbid Hypertension, Peripheral Venous Weeks Of Treatment: 12 History: Disease, Gout, Osteoarthritis Clustered Wound: No Pending Amputation On Presentation Photos Photo Uploaded By: Curtis Sites on 03/26/2017 16:23:19 Wound Measurements Length: (cm) 0.3 Width: (cm) 0.5 Depth: (cm) 0.1 Area: (cm) 0.118 Volume: (cm) 0.012 % Reduction in Area: 97.7% % Reduction in Volume: 98.8% Epithelialization: Large (67-100%) Tunneling: No Undermining: No Wound Description Full Thickness With Exposed Foul Odor After Classification: Support Structures Slough/Fibrino Wound Margin: Flat and Intact Exudate Large Amount: Exudate Type: Serous Exudate Color: amber Cleansing: No Yes Wound Bed Granulation Amount: Large (67-100%) Exposed  Structure Granulation Quality: Red Fascia Exposed: No Ontko, [name] ([id number]) Necrotic Amount: None Present (0%) Fat Layer (Subcutaneous Tissue) Exposed: Yes Tendon Exposed: No Muscle Exposed: No Joint Exposed: No Bone Exposed: No Periwound Skin Texture Texture Color No Abnormalities Noted: No No Abnormalities Noted: No Callus: No Atrophie Blanche: No Crepitus: No Cyanosis: No Excoriation: No Ecchymosis: No Induration: No Erythema: No Rash: No Hemosiderin Staining: No Scarring: No Mottled: No Pallor: No Moisture Rubor: No No Abnormalities Noted: No Dry / Scaly: No Temperature / Pain Maceration: No Temperature: No Abnormality Tenderness on Palpation: Yes Wound Preparation Ulcer Cleansing: Rinsed/Irrigated with Saline Topical Anesthetic Applied: Other: lidocaine 4%, Treatment Notes Wound #2 (Right Malleolus) 1. Cleansed with: Clean wound with Normal Saline 5. Secondary Dressing Applied Bordered Foam Dressing Notes netting Electronic Signature(s) Signed: 03/27/2017 5:11:04 PM By: [name] Entered By: [name] on 03/26/2017 12:46:30 Lippy, [name] ([id number]) -------------------------------------------------------------------------------- Vitals Details Patient Name: [name] Date of Service: 03/26/2017 12:30 PM Medical Record Number: [id number] Patient Account Number: [id number] Date of Birth/Sex: 1933-10-27 (83 y.o. Male) Treating RN: [name] Primary Care Lannah Koike: [name] Other Clinician: Referring Ronniesha Seibold: [name] Treating Jalea Bronaugh/Extender: [name] in Treatment: 12 Vital Signs Time Taken: 12:37 Temperature (F): 98.0 Pulse (bpm): 73 Respiratory Rate (breaths/min): 16 Blood Pressure (mmHg): 96/60 Reference Range: 80 - 120 mg / dl Electronic Signature(s) Signed: 03/27/2017 5:11:04 PM By: [name] Entered By: [name] on 03/26/2017 12:39:57

## 2017-04-02 ENCOUNTER — Encounter: Payer: Medicare HMO | Admitting: Surgery

## 2017-04-02 DIAGNOSIS — I70235 Atherosclerosis of native arteries of right leg with ulceration of other part of foot: Secondary | ICD-10-CM | POA: Diagnosis not present

## 2017-04-04 NOTE — Progress Notes (Signed)
[place], [place] ([id number]) Visit Report for 04/02/2017 Chief Complaint Document Details Patient Name: [name], [name] Date of Service: 04/02/2017 9:00 AM Medical Record Number: [id number] Patient Account Number: [id number] Date of Birth/Sex: 11-29-1933 (83 y.o. Male) Treating RN: Ahmed Prima Primary Care Provider: Delight Stare Other Clinician: Referring Provider: Delight Stare Treating Provider/Extender: Frann Rider in Treatment: 13 Information Obtained from: Patient Chief Complaint 12/30/16; patient arrives from [place] unaccompanied. He has 3 open wounds on the right foot and 2 threatened areas Electronic Signature(s) Signed: 04/02/2017 12:09:02 PM By: Christin Fudge MD, FACS Entered By: Christin Fudge on 04/02/2017 09:13:47 [place], [name] ([id number]) -------------------------------------------------------------------------------- HPI Details Patient Name: [name] Date of Service: 04/02/2017 9:00 AM Medical Record Number: [id number] Patient Account Number: [id number] Date of Birth/Sex: 1933/11/05 (83 y.o. Male) Treating RN: Ahmed Prima Primary Care Provider: Delight Stare Other Clinician: Referring Provider: Delight Stare Treating Provider/Extender: Frann Rider in Treatment: 13 History of Present Illness HPI Description: 12/30/16; this is an 83 year old man who comes from [facility] skilled facility. He comes today unaccompanied and without any history. Looking through [facility] link I am able to see that he was admitted to hospital from 5/25 through 12/04/16 at that point with dehydration, severe hyponatremia acute kidney injury. He was treated with IV fluid replacement including 3% normal saline. Nowhere in his discharge summary did I see any reference to wounds on his feet and is mentioned he does not come in with any history. He does come in with a pack of cigarettes therefore I'm assuming he is a smoker. He is not a listed diabetic.  There is some reference in his notes that he has a DSS Education officer, [employer] is his guardian I'm not completely sure if that is true in any case the patient has some degree of dementia although he is able to tell me that he is 82 years old came from [place]. He is not able to give a history of these wounds. Our intake nurse notes maggots in the right third toe. Beyond this he has an area over the right lateral malleolus on the right lateral foot. He also has to threatened areas on the base of the right fifth metatarsal and on the lateral aspect of the right fifth metatarsal head. We are completely unable to do ABIs in the clinic largely because when we inflated the cuff he seemed to withdraw the foot. 01/05/17; the patient was seen urgently by Dr. Delana Meyer of vein and vascular. He agreed with our assessment of critical limb ischemia and set him up for an angiogram. Unfortunately the patient was not transported for preoperative evaluation therefore the angiogram has been canceled. I am not exactly sure if and when this is going to be rescheduled. The patient's wounds look about the same, right lateral ankle right lateral foot third toe. There is also concerning areas over the fifth met head and base of the fifth metatarsal which are not open but are covered by a dark eschar logorrhea and painful. We have been using silver alginate and x-ray of the right foot done at the facility showed no definite radiographic evidence of acute fracture or dislocation there was an old avulsion fracture of the proximal phalanx fifth digit as well as old fractures of the base of the proximal phalanx of the fifth digit. There is no comment about osteomyelitis 01/16/17 I have patient's record from his operative report from [facility] with Dr. Delana Meyer were fortunately patient underwent a successful angioplasty in regard to  his right lower extremity. The anterior tibial in-line flow was improved with less than  5% residual stenosis. Angioplasty of the SFA and popliteal arteries also yielded excellent results in post procedure testing with less than 10% residual stenosis. Overall patient appears to have done very well with this procedure. Hopefully this will translate into better wound healing in regard to his right lower extremity wounds. 01/22/2017 -- the patient comes with no dressings appropriate on his right lower extremity and the patient is rather non-verbal and coherent. He has no nursing or caregiver 02/12/17 on evaluation today patient appears to be doing a little better in regard to his wounds and the [name], [name] ([id number]) dressings were in place at all for wound locations as they should be. With that being said he has some discomfort noted with cleansing of the wounds. No fevers, chills, nausea, or vomiting noted at this time. 02/19/17 on evaluation today patient appears to be doing well in regard to his wounds. All are measuring a little bit smaller at this time. He has been tolerating the dressing changes without complication. He does appear to be quite a bit drowsy today when questioned he tells me that he didn't sleep well last night due to arthritis. Otherwise he does not describe any pain at this time although she really did not talk much. No fevers, chills, nausea, or vomiting noted at this time. 02/26/2017 -- we have been having problems having the patient come here without any caregivers and today his caseworker was here and we had a detailed discussion with her regarding his treatment plan and the lack of coordinated care at his facility. She will look into this. 03/19/2017 -- patient comes along with his caregiver and of note he has a large swelling in the right periorbital region with ecchymosis and raccoon eyes. The caregiver is not sure whether he has been worked up for this and is not sure when this happened. Electronic Signature(s) Signed: 04/02/2017 12:09:02 PM By: Evlyn Kanner MD, FACS Entered By: Evlyn Kanner on 04/02/2017 09:13:54 Scholle, [name] ([id number]) -------------------------------------------------------------------------------- Physical Exam Details Patient Name: [name] Date of Service: 04/02/2017 9:00 AM Medical Record Number: [id number] Patient Account Number: [id number] Date of Birth/Sex: 1933/12/26 (83 y.o. Male) Treating RN: Phillis Haggis Primary Care Provider: Darreld Mclean Other Clinician: Referring Provider: Darreld Mclean Treating Provider/Extender: Rudene Re in Treatment: 13 Constitutional . Pulse regular. Respirations normal and unlabored. Afebrile. . Eyes Nonicteric. Reactive to light. Ears, Nose, Mouth, and Throat Lips, teeth, and gums WNL.Marland Kitchen Moist mucosa without lesions. Neck supple and nontender. No palpable supraclavicular or cervical adenopathy. Normal sized without goiter. Respiratory WNL. No retractions.. Cardiovascular Pedal Pulses WNL. No clubbing, cyanosis or edema. Lymphatic No adneopathy. No adenopathy. No adenopathy. Musculoskeletal Adexa without tenderness or enlargement.. Digits and nails w/o clubbing, cyanosis, infection, petechiae, ischemia, or inflammatory conditions.. Integumentary (Hair, Skin) No suspicious lesions. No crepitus or fluctuance. No peri-wound warmth or erythema. No masses.Marland Kitchen Psychiatric Judgement and insight Intact.. No evidence of depression, anxiety, or agitation.. Notes the wounds on the right lateral ankle have completely healed and all the previous wounds are looking good with supple scar tissue Electronic Signature(s) Signed: 04/02/2017 12:09:02 PM By: Evlyn Kanner MD, FACS Entered By: Evlyn Kanner on 04/02/2017 09:14:15 Desrosier, [name] ([id number]) -------------------------------------------------------------------------------- Physician Orders Details Patient Name: [name] Date of Service: 04/02/2017 9:00 AM Medical Record Number: [id number] Patient  Account Number: [id number] Date of Birth/Sex: 01-31-1934 (83 y.o. Male) Treating RN: Phillis Haggis Primary Care Provider: Darreld Mclean Other  Clinician: Referring Provider: Delight Stare Treating Provider/Extender: Frann Rider in Treatment: 69 Verbal / Phone Orders: No Diagnosis Coding Discharge From [facility] o Discharge from [place] area clean and dry. Please call our office if you have any questions or concerns. Electronic Signature(s) Signed: 04/02/2017 12:09:02 PM By: Christin Fudge MD, FACS Signed: 04/02/2017 4:27:23 PM By: Alric Quan Entered By: Alric Quan on 04/02/2017 09:12:58 Sizelove, [name] ([id number]) -------------------------------------------------------------------------------- Problem List Details Patient Name: Gassert, [name] Date of Service: 04/02/2017 9:00 AM Medical Record Number: [id number] Patient Account Number: [id number] Date of Birth/Sex: 01/31/1934 (83 y.o. 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.513 Non-pressure chronic ulcer of other part of right foot with 12/30/2016 Yes necrosis of muscle I70.235 Atherosclerosis of native arteries of right leg with 12/30/2016 Yes ulceration of other part of foot I70.245 Atherosclerosis of native arteries of left leg with ulceration 12/30/2016 Yes of other part of foot F17.218 Nicotine dependence, cigarettes, with other nicotine- 01/22/2017 Yes induced disorders Inactive Problems Resolved Problems Electronic Signature(s) Signed: 04/02/2017 12:09:02 PM By: Christin Fudge  MD, FACS Entered By: Christin Fudge on 04/02/2017 09:13:36 Qualls, [name] ([id number]) -------------------------------------------------------------------------------- Progress Note Details Patient Name: [name] Date of Service: 04/02/2017 9:00 AM Medical Record Number: [id number] Patient Account Number: [id number] Date of Birth/Sex: 1934-02-27 (83 y.o. Male) Treating RN: Ahmed Prima Primary Care Provider: Delight Stare Other Clinician: Referring Provider: Delight Stare Treating Provider/Extender: Frann Rider in Treatment: 13 Subjective Chief Complaint Information obtained from Patient 12/30/16; patient arrives from [place] unaccompanied. He has 3 open wounds on the right foot and 2 threatened areas History of Present Illness (HPI) 12/30/16; this is an 83 year old man who comes from [place] skilled facility. He comes today unaccompanied and without any history. Looking through [facility] link I am able to see that he was admitted to hospital from 5/25 through 12/04/16 at that point with dehydration, severe hyponatremia acute kidney injury. He was treated with IV fluid replacement including 3% normal saline. Nowhere in his discharge summary did I see any reference to wounds on his feet and is mentioned he does not come in with any history. He does come in with a pack of cigarettes therefore I'm assuming he is a smoker. He is not a listed diabetic. There is some reference in his notes that he has a DSS Education officer, [employer] is his guardian I'm not completely sure if that is true in any case the patient has some degree of dementia although he is able to tell me that he is 83 years old came from [place]. He is not able to give a history of these wounds. Our intake nurse notes maggots in the right third toe. Beyond this he has an area over the right lateral malleolus on the right lateral foot. He also has to threatened areas on the base of the right fifth  metatarsal and on the lateral aspect of the right fifth metatarsal head. We are completely unable to do ABIs in the clinic largely because when we inflated the cuff he seemed to withdraw the foot. 01/05/17; the patient was seen urgently by Dr. Delana Meyer of vein and vascular. He agreed  with our assessment of critical limb ischemia and set him up for an angiogram. Unfortunately the patient was not transported for preoperative evaluation therefore the angiogram has been canceled. I am not exactly sure if and when this is going to be rescheduled. The patient's wounds look about the same, right lateral ankle right lateral foot third toe. There is also concerning areas over the fifth met head and base of the fifth metatarsal which are not open but are covered by a dark eschar logorrhea and painful. We have been using silver alginate and x-ray of the right foot done at the facility showed no definite radiographic evidence of acute fracture or dislocation there was an old avulsion fracture of the proximal phalanx fifth digit as well as old fractures of the base of the proximal phalanx of the fifth digit. There is no comment about osteomyelitis 01/16/17 I have patient's record from his operative report from [facility] with Dr. Delana Meyer were fortunately patient underwent a successful angioplasty in regard to his right lower extremity. The anterior tibial in-line flow was improved with less than 5% residual stenosis. Angioplasty of the SFA and popliteal arteries also yielded excellent results in post procedure testing with less than 10% residual [place], [name] ([id number]) stenosis. Overall patient appears to have done very well with this procedure. Hopefully this will translate into better wound healing in regard to his right lower extremity wounds. 01/22/2017 -- the patient comes with no dressings appropriate on his right lower extremity and the patient is rather non-verbal and coherent. He has no  nursing or caregiver 02/12/17 on evaluation today patient appears to be doing a little better in regard to his wounds and the dressings were in place at all for wound locations as they should be. With that being said he has some discomfort noted with cleansing of the wounds. No fevers, chills, nausea, or vomiting noted at this time. 02/19/17 on evaluation today patient appears to be doing well in regard to his wounds. All are measuring a little bit smaller at this time. He has been tolerating the dressing changes without complication. He does appear to be quite a bit drowsy today when questioned he tells me that he didn't sleep well last night due to arthritis. Otherwise he does not describe any pain at this time although she really did not talk much. No fevers, chills, nausea, or vomiting noted at this time. 02/26/2017 -- we have been having problems having the patient come here without any caregivers and today his caseworker was here and we had a detailed discussion with her regarding his treatment plan and the lack of coordinated care at his facility. She will look into this. 03/19/2017 -- patient comes along with his caregiver and of note he has a large swelling in the right periorbital region with ecchymosis and raccoon eyes. The caregiver is not sure whether he has been worked up for this and is not sure when this happened. Objective Constitutional Pulse regular. Respirations normal and unlabored. Afebrile. Vitals Time Taken: 8:59 AM, Temperature: 97.7 F, Pulse: 75 bpm, Respiratory Rate: 16 breaths/min, Blood Pressure: 124/57 mmHg. Eyes Nonicteric. Reactive to light. Ears, Nose, Mouth, and Throat Lips, teeth, and gums WNL.Marland Kitchen Moist mucosa without lesions. Neck supple and nontender. No palpable supraclavicular or cervical adenopathy. Normal sized without goiter. Messinger, [name] ([id number]) Respiratory WNL. No retractions.. Cardiovascular Pedal Pulses WNL. No clubbing, cyanosis or  edema. Lymphatic No adneopathy. No adenopathy. No adenopathy. Musculoskeletal Adexa without tenderness or enlargement.. Digits and nails  w/o clubbing, cyanosis, infection, petechiae, ischemia, or inflammatory conditions.Marland Kitchen Psychiatric Judgement and insight Intact.. No evidence of depression, anxiety, or agitation.. General Notes: the wounds on the right lateral ankle have completely healed and all the previous wounds are looking good with supple scar tissue Integumentary (Hair, Skin) No suspicious lesions. No crepitus or fluctuance. No peri-wound warmth or erythema. No masses.. Wound #2 status is Open. Original cause of wound was Gradually Appeared. The wound is located on the Right Malleolus. The wound measures 0cm length x 0cm width x 0cm depth; 0cm^2 area and 0cm^3 volume. There is Fat Layer (Subcutaneous Tissue) Exposed exposed. There is no tunneling or undermining noted. There is a none present amount of drainage noted. The wound margin is flat and intact. There is no granulation within the wound bed. There is no necrotic tissue within the wound bed. The periwound skin appearance did not exhibit: Callus, Crepitus, Excoriation, Induration, Rash, Scarring, Dry/Scaly, Maceration, Atrophie Blanche, Cyanosis, Ecchymosis, Hemosiderin Staining, Mottled, Pallor, Rubor, Erythema. Periwound temperature was noted as No Abnormality. 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: Discharge from Wound Care Center - Keep area clean and dry. Please call our office if you have any questions or concerns. the patient is discharged from the wound care services and I have asked his caregiver to try and have these wounds covered with a bordered foam for the next several weeks so as to protect them. He is also urged to give up smoking. Electronic Signature(s) Signed: 04/02/2017 12:09:02 PM By: Evlyn Kanner MD, FACS Entered By: Evlyn Kanner on 04/02/2017 09:14:43 Hineman, [name] ([id number]) -------------------------------------------------------------------------------- SuperBill Details Patient Name: Frees, [name] Date of Service: 04/02/2017 Medical Record Number: [id number] Patient Account Number: [id number] Date of Birth/Sex: [birth year] (83 y.o. 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 F17.218 Nicotine dependence, cigarettes, with other nicotine-induced disorders Facility Procedures CPT4 Code: [id number] Description: 99213 - WOUND CARE VISIT-LEV 3 EST PT Modifier: Quantity: 1 Physician Procedures CPT4: Description Modifier Quantity Code [id number] - WC PHYS LEVEL 2 - EST PT 1 ICD-10 Description Diagnosis 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 

## 2017-04-06 NOTE — Progress Notes (Signed)
[name], [name] ([id number]) Visit Report for 04/02/2017 Arrival Information Details Patient Name: [name], [name] Date of Service: 04/02/2017 9:00 AM Medical Record Number: [id number] Patient Account Number: [id number] Date of Birth/Sex: January 17, 1934 (83 y.o. Male) Treating RN: Phillis Haggis Primary Care Vondell Sowell: Darreld Mclean Other Clinician: Referring Neftaly Swiss: Darreld Mclean Treating Arie Gable/Extender: Rudene Re in Treatment: 13 Visit Information History Since Last Visit All ordered tests and consults were completed: No Patient Arrived: Wheel Chair Added or deleted any medications: No Arrival Time: 08:59 Any new allergies or adverse reactions: No Accompanied By: caregiver Had a fall or experienced change in No activities of daily living that may affect Transfer Assistance: Other risk of falls: Patient Identification Verified: Yes Signs or symptoms of abuse/neglect since last No Secondary Verification Process Yes visito Completed: Hospitalized since last visit: No Patient Requires Transmission-Based No Has Dressing in Place as Prescribed: Yes Precautions: Pain Present Now: No Patient Has Alerts: Yes Patient Alerts: ABI inaudible Electronic Signature(s) Signed: 04/02/2017 4:27:23 PM By: Alejandro Mulling Entered By: Alejandro Mulling on 04/02/2017 08:59:38 [name], [name] ([id number]) -------------------------------------------------------------------------------- Clinic Level of Care Assessment Details Patient Name: [name] Date of Service: 04/02/2017 9:00 AM Medical Record Number: [id number] Patient Account Number: [id number] Date of Birth/Sex: 09-Mar-1934 (83 y.o. Male) Treating RN: Phillis Haggis Primary Care Vester Titsworth: Darreld Mclean Other Clinician: Referring Zaylin Runco: Darreld Mclean Treating Nickie Warwick/Extender: Rudene Re in Treatment: 13 Clinic Level of Care Assessment Items TOOL 4 Quantity Score X - Use when only an EandM is performed on FOLLOW-UP  visit 1 0 ASSESSMENTS - Nursing Assessment / Reassessment X - Reassessment of Co-morbidities (includes updates in patient status) 1 10 X - Reassessment of Adherence to Treatment Plan 1 5 ASSESSMENTS - Wound and Skin Assessment / Reassessment X - Simple Wound Assessment / Reassessment - one wound 1 5  - Complex Wound Assessment / Reassessment - multiple wounds 0  - Dermatologic / Skin Assessment (not related to wound area) 0 ASSESSMENTS - Focused Assessment  - Circumferential Edema Measurements - multi extremities 0  - Nutritional Assessment / Counseling / Intervention 0  - Lower Extremity Assessment (monofilament, tuning fork, pulses) 0  - Peripheral Arterial Disease Assessment (using hand held doppler) 0 ASSESSMENTS - Ostomy and/or Continence Assessment and Care  - Incontinence Assessment and Management 0  - Ostomy Care Assessment and Management (repouching, etc.) 0 PROCESS - Coordination of Care  - Simple Patient / Family Education for ongoing care 0 X - Complex (extensive) Patient / Family Education for ongoing care 1 20 X - Staff obtains Chiropractor, Records, Test Results / Process Orders 1 10 X - Staff telephones HHA, Nursing Homes / Clarify orders / etc 1 10  - Routine Transfer to another Facility (non-emergent condition) 0 [name] ([id number])  - Routine Hospital Admission (non-emergent condition) 0  - New Admissions / Manufacturing engineer / Ordering NPWT, Apligraf, etc. 0  - Emergency Hospital Admission (emergent condition) 0 X - Simple Discharge Coordination 1 10  - Complex (extensive) Discharge Coordination 0 PROCESS - Special Needs  - Pediatric / Minor Patient Management 0  - Isolation Patient Management 0  - Hearing / Language / Visual special needs 0  - Assessment of Community assistance (transportation, D/C planning, etc.) 0  - Additional assistance / Altered mentation 0  - Support Surface(s) Assessment (bed, cushion,  seat, etc.) 0 INTERVENTIONS - Wound Cleansing / Measurement X - Simple Wound Cleansing - one wound 1 5  - Complex Wound Cleansing - multiple wounds 0 X - Wound Imaging (  photographs - any number of wounds) 1 5  - Wound Tracing (instead of photographs) 0  - Simple Wound Measurement - one wound 0  - Complex Wound Measurement - multiple wounds 0 INTERVENTIONS - Wound Dressings  - Small Wound Dressing one or multiple wounds 0  - Medium Wound Dressing one or multiple wounds 0  - Large Wound Dressing one or multiple wounds 0  - Application of Medications - topical 0  - Application of Medications - injection 0 INTERVENTIONS - Miscellaneous  - External ear exam 0 [name] ([id number])  - Specimen Collection (cultures, biopsies, blood, body fluids, etc.) 0  - Specimen(s) / Culture(s) sent or taken to Lab for analysis 0 X - Patient Transfer (multiple staff / Nurse, adult / Similar devices) 1 10  - Simple Staple / Suture removal (25 or less) 0  - Complex Staple / Suture removal (26 or more) 0  - Hypo / Hyperglycemic Management (close monitor of Blood Glucose) 0  - Ankle / Brachial Index (ABI) - do not check if billed separately 0 X - Vital Signs 1 5 Has the patient been seen at the hospital within the last three years: Yes Total Score: 95 Level Of Care: New/Established - Level 3 Electronic Signature(s) Signed: 04/02/2017 4:27:23 PM By: Alejandro Mulling Entered By: Alejandro Mulling on 04/02/2017 09:29:31 [name], [name] ([id number]) -------------------------------------------------------------------------------- Encounter Discharge Information Details Patient Name: [name] Date of Service: 04/02/2017 9:00 AM Medical Record Number: [id number] Patient Account Number: [id number] Date of Birth/Sex: 1934/04/26 (83 y.o. Male) Treating RN: Phillis Haggis Primary Care Maura Braaten: Darreld Mclean Other Clinician: Referring Duvall Comes: Darreld Mclean Treating  Tanishka Drolet/Extender: Rudene Re in Treatment: 13 Encounter Discharge Information Items Discharge Pain Level: 0 Discharge Condition: Stable Ambulatory Status: Wheelchair Discharge Destination: Nursing Home Transportation: Other Accompanied By: caregiver Schedule Follow-up Appointment: No Medication Reconciliation completed and provided to Patient/Care No Cynethia Schindler: Provided on Clinical Summary of Care: 04/02/2017 Form Type Recipient Paper Patient LP Electronic Signature(s) Signed: 04/06/2017 9:00:05 AM By: Gwenlyn Perking Entered By: Gwenlyn Perking on 04/02/2017 09:19:03 [name], [name] ([id number]) -------------------------------------------------------------------------------- Lower Extremity Assessment Details Patient Name: [name] Date of Service: 04/02/2017 9:00 AM Medical Record Number: [id number] Patient Account Number: [id number] Date of Birth/Sex: 11/07/1933 (83 y.o. Male) Treating RN: Phillis Haggis Primary Care Callie Bunyard: Darreld Mclean Other Clinician: Referring Reianna Batdorf: Darreld Mclean Treating [name] Kahrs/Extender: Rudene Re in Treatment: 13 Vascular Assessment Pulses: Dorsalis Pedis Palpable: [Right:Yes] Posterior Tibial Extremity colors, hair growth, and conditions: Extremity Color: [Right:Hyperpigmented] Temperature of Extremity: [Right:Warm] Capillary Refill: [Right:< 3 seconds] Toe Nail Assessment Left: Right: Thick: Yes Discolored: Yes Deformed: Yes Improper Length and Hygiene: Yes Electronic Signature(s) Signed: 04/02/2017 4:27:23 PM By: Alejandro Mulling Entered By: Alejandro Mulling on 04/02/2017 09:11:59 [name], [name] ([id number]) -------------------------------------------------------------------------------- Multi Wound Chart Details Patient Name: [name] Date of Service: 04/02/2017 9:00 AM Medical Record Number: [id number] Patient Account Number: [id number] Date of Birth/Sex: [birth year] (83 y.o. Male) Treating RN: Phillis Haggis Primary Care Ardit Danh: Darreld Mclean Other Clinician: Referring Aviendha Azbell: Darreld Mclean Treating Alexsa Flaum/Extender: Rudene Re in Treatment: 13 Vital Signs Height(in): Pulse(bpm): 75 Weight(lbs): Blood Pressure 124/57 (mmHg): Body Mass Index(BMI): Temperature(F): 97.7 Respiratory Rate 16 (breaths/min): Photos: [2:No Photos] [N/A:N/A] Wound Location: [2:Right Malleolus] [N/A:N/A] Wounding Event: [2:Gradually Appeared] [N/A:N/A] Primary Etiology: [2:Arterial Insufficiency Ulcer N/A] Comorbid History: [2:Hypertension, Peripheral N/A Venous Disease, Gout, Osteoarthritis] Date Acquired: [2:12/04/2016] [N/A:N/A] Weeks of Treatment: [2:13] [N/A:N/A] Wound Status: [2:Open] [N/A:N/A] Pending Amputation on Yes [N/A:N/A] Presentation: Measurements L x W x D 0x0x0 [N/A:N/A] (cm) Area (  cm) : [2:0] [N/A:N/A] Volume (cm) : [2:0] [N/A:N/A] % Reduction in Area: [2:100.00%] [N/A:N/A] % Reduction in Volume: 100.00% [N/A:N/A] Classification: [2:Full Thickness With Exposed Support Structures] [N/A:N/A] Exudate Amount: [2:None Present] [N/A:N/A] Wound Margin: [2:Flat and Intact] [N/A:N/A] Granulation Amount: [2:None Present (0%)] [N/A:N/A] Necrotic Amount: [2:None Present (0%)] [N/A:N/A] Exposed Structures: [2:Fat Layer (Subcutaneous N/A Tissue) Exposed: Yes Fascia: No Tendon: No Muscle: No] Joint: No Bone: No Epithelialization: Large (67-100%) N/A N/A Periwound Skin Texture: Excoriation: No N/A N/A Induration: No Callus: No Crepitus: No Rash: No Scarring: No Periwound Skin Maceration: No N/A N/A Moisture: Dry/Scaly: No Periwound Skin Color: Atrophie Blanche: No N/A N/A Cyanosis: No Ecchymosis: No Erythema: No Hemosiderin Staining: No Mottled: No Pallor: No Rubor: No Temperature: No Abnormality N/A N/A Tenderness on No N/A N/A Palpation: Wound Preparation: Ulcer Cleansing: N/A N/A Rinsed/Irrigated with Saline Topical Anesthetic Applied: None Treatment  Notes Electronic Signature(s) Signed: 04/02/2017 12:09:02 PM By: Evlyn Kanner MD, FACS Entered By: Evlyn Kanner on 04/02/2017 09:13:41 [name], [name] ([id number]) -------------------------------------------------------------------------------- Multi-Disciplinary Care Plan Details Patient Name: [name] Date of Service: 04/02/2017 9:00 AM Medical Record Number: [id number] Patient Account Number: [id number] Date of Birth/Sex: December 16, 1933 (83 y.o. Male) Treating RN: Phillis Haggis Primary Care Liliauna Santoni: Darreld Mclean Other Clinician: Referring Billie Trager: Darreld Mclean Treating Iriel Nason/Extender: Rudene Re in Treatment: 13 Active Inactive Electronic Signature(s) Signed: 04/02/2017 4:27:23 PM By: Alejandro Mulling Entered By: Alejandro Mulling on 04/02/2017 09:12:16 [name], [name] ([id number]) -------------------------------------------------------------------------------- Pain Assessment Details Patient Name: [name] Date of Service: 04/02/2017 9:00 AM Medical Record Number: [id number] Patient Account Number: [id number] Date of Birth/Sex: February 17, 1934 (83 y.o. Male) Treating RN: Phillis Haggis Primary Care Blaze Sandin: Darreld Mclean Other Clinician: Referring Ukiah Trawick: Darreld Mclean Treating Zach Tietje/Extender: Rudene Re in Treatment: 13 Active Problems Location of Pain Severity and Description of Pain Patient Has Paino No Site Locations Pain Management and Medication Current Pain Management: Electronic Signature(s) Signed: 04/02/2017 4:27:23 PM By: Alejandro Mulling Entered By: Alejandro Mulling on 04/02/2017 08:59:43 [name], [name] ([id number]) -------------------------------------------------------------------------------- Patient/Caregiver Education Details Patient Name: [name] Date of Service: 04/02/2017 9:00 AM Medical Record Number: [id number] Patient Account Number: [id number] Date of Birth/Gender: 1933/12/22 (83 y.o. Male) Treating RN: Phillis Haggis Primary Care Physician: Darreld Mclean Other Clinician: Referring Physician: Darreld Mclean Treating Physician/Extender: Rudene Re in Treatment: 13 Education Assessment Education Provided To: Patient Education Topics Provided Wound/Skin Impairment: Handouts: Other: Keep area clean and dry. Please call our office if you have any questions or concerns. Methods: Explain/Verbal Responses: State content correctly Electronic Signature(s) Signed: 04/02/2017 4:27:23 PM By: Alejandro Mulling Entered By: Alejandro Mulling on 04/02/2017 09:13:43 [name], [name] ([id number]) -------------------------------------------------------------------------------- Wound Assessment Details Patient Name: [name] Date of Service: 04/02/2017 9:00 AM Medical Record Number: [id number] Patient Account Number: [id number] Date of Birth/Sex: 1933-07-14 (83 y.o. Male) Treating RN: Phillis Haggis Primary Care Jasani Dolney: Darreld Mclean Other Clinician: Referring Peggye Poon: Darreld Mclean Treating Aneli Zara/Extender: Rudene Re in Treatment: 13 Wound Status Wound Number: 2 Primary Arterial Insufficiency Ulcer Etiology: Wound Location: Right Malleolus Wound Open Wounding Event: Gradually Appeared Status: Date Acquired: 12/04/2016 Comorbid Hypertension, Peripheral Venous Weeks Of Treatment: 13 History: Disease, Gout, Osteoarthritis Clustered Wound: No Pending Amputation On Presentation Photos Photo Uploaded By: Alejandro Mulling on 04/02/2017 12:59:20 Wound Measurements Length: (cm) 0 % Reduction in Width: (cm) 0 % Reduction in Depth: (cm) 0 Epithelializati Area: (cm) 0 Tunneling: Volume: (cm) 0 Undermining: Area: 100% Volume: 100% on: Large (67-100%) No No Wound Description Full Thickness With Exposed Foul Odor After Classification: Support Structures Slough/Fibrino  Wound Margin: Flat and Intact Exudate None Present Amount: Cleansing: No No Wound Bed Granulation Amount:  None Present (0%) Exposed Structure Necrotic Amount: None Present (0%) Fascia Exposed: No Fat Layer (Subcutaneous Tissue) Exposed: Yes Tendon Exposed: No Thal, [name] ([id number]) Muscle Exposed: No Joint Exposed: No Bone Exposed: No Periwound Skin Texture Texture Color No Abnormalities Noted: No No Abnormalities Noted: No Callus: No Atrophie Blanche: No Crepitus: No Cyanosis: No Excoriation: No Ecchymosis: No Induration: No Erythema: No Rash: No Hemosiderin Staining: No Scarring: No Mottled: No Pallor: No Moisture Rubor: No No Abnormalities Noted: No Dry / Scaly: No Temperature / Pain Maceration: No Temperature: No Abnormality Wound Preparation Ulcer Cleansing: Rinsed/Irrigated with Saline Topical Anesthetic Applied: None Electronic Signature(s) Signed: 04/02/2017 4:27:23 PM By: Alejandro Mulling Entered By: Alejandro Mulling on 04/02/2017 09:11:47 [name], [name] ([id number]) -------------------------------------------------------------------------------- Vitals Details Patient Name: [name] Date of Service: 04/02/2017 9:00 AM Medical Record Number: [id number] Patient Account Number: [id number] Date of Birth/Sex: 04-07-1934 (83 y.o. Male) Treating RN: Phillis Haggis Primary Care Cotey Rakes: Darreld Mclean Other Clinician: Referring Lincon Sahlin: Darreld Mclean Treating Camreigh Michie/Extender: Rudene Re in Treatment: 13 Vital Signs Time Taken: 08:59 Temperature (F): 97.7 Pulse (bpm): 75 Respiratory Rate (breaths/min): 16 Blood Pressure (mmHg): 124/57 Reference Range: 80 - 120 mg / dl Electronic Signature(s) Signed: 04/02/2017 4:27:23 PM By: Alejandro Mulling Entered By: Alejandro Mulling on 04/02/2017 09:07:01

## 2017-04-13 NOTE — Progress Notes (Signed)
04/14/2017 10:01 AM   [name] Gayle Sr. October 10, 1933 [id number]  Referring provider: [name], MD [address] [place], [place] [address]  Chief Complaint  Patient presents with  . Benign Prostatic Hypertrophy    1 year follow up  . Elevated PSA    HPI: Patient is an 83 year old [place] American male with ED and BPH with LU TS who presents today for a one year follow up with his Child psychotherapist, Fish farm manager.    Erectile dysfunction Patient is no longer sexually active due to co morbidities.    BPH with LUTS IPSS score today is 7, which is mild LUTS.  He is mixed with his quality of life due to his urinary symptoms.   His previous IPSS score was 7/2.   His previous PVR was 0 mL.  His major complaint was nocturia and leakage of urine .  He has had these symptoms for several years.  He denies any dysuria, hematuria or suprapubic pain.   He also denies any recent fevers, chills, nausea or vomiting.  He does not have a family history of PCa.        IPSS    Row Name 04/14/17 0900         International Prostate Symptom Score   How often have you had the sensation of not emptying your bladder? Not at All     How often have you had to urinate less than every two hours? Less than 1 in 5 times     How often have you found you stopped and started again several times when you urinated? Less than 1 in 5 times     How often have you found it difficult to postpone urination? Less than 1 in 5 times     How often have you had a weak urinary stream? Less than 1 in 5 times     How often have you had to strain to start urination? Less than 1 in 5 times     How many times did you typically get up at night to urinate? 2 Times     Total IPSS Score 7       Quality of Life due to urinary symptoms   If you were to spend the rest of your life with your urinary condition just the way it is now how would you feel about that? Mixed        Score:  1-7 Mild 8-19 Moderate 20-35 Severe  Patient  is very somnolent at today's exam.  He only wakes up to answer questions and that immediately falls back asleep.  Social worker states that he has had episodes of hypoxia and following asleep while outside smoking at his [facility].  He is hypotensive today with blood pressure 88/61.  Pulse ox 98%.  Social worker states that he is often found leaning forward in his wheelchair to the pointer almost fallen out in an effort to breathe more efficiently.    PMH: Past Medical History:  Diagnosis Date  . Arthritis   . Gout   . Hypertension   . Stroke [facility])    left sided weakness    Surgical History: Past Surgical History:  Procedure Laterality Date  . INGUINAL HERNIA REPAIR Right 09/21/2015   Procedure: HERNIA REPAIR INGUINAL ADULT;  Surgeon: [name], MD;  Location: [facility] ORS;  Service: General;  Laterality: Right;  . LOWER EXTREMITY ANGIOGRAPHY Left 09/30/2016   Procedure: Lower Extremity Angiography;  Surgeon: Earl Lites  [name], MD;  Location: [facility] INVASIVE CV LAB;  Service: Cardiovascular;  Laterality: Left;  . LOWER EXTREMITY ANGIOGRAPHY Right 01/13/2017   Procedure: Lower Extremity Angiography;  Surgeon: [name], MD;  Location: [facility] INVASIVE CV LAB;  Service: Cardiovascular;  Laterality: Right;    Home Medications:  Allergies as of 04/14/2017      Reactions   Hydrochlorothiazide    Documented on MAR   Lisinopril    Documented on [facility]      Medication List       Accurate as of 04/14/17 10:01 AM. Always use your most recent med list.          acetaminophen 325 MG tablet Commonly known as:  TYLENOL Take 650 mg by mouth every 6 (six) hours as needed for moderate pain.   albuterol 108 (90 Base) MCG/ACT inhaler Commonly known as:  PROVENTIL HFA;VENTOLIN HFA Inhale 2 puffs into the lungs every 6 (six) hours as needed for wheezing or shortness of breath.   amLODipine 5 MG tablet Commonly known as:  NORVASC Take 5 mg by mouth daily.   aspirin EC 81 MG tablet Take 81  mg by mouth daily.   clopidogrel 75 MG tablet Commonly known as:  PLAVIX Take 1 tablet (75 mg total) by mouth daily.   clopidogrel 75 MG tablet Commonly known as:  PLAVIX Take 1 tablet (75 mg total) by mouth daily.   COLACE 100 MG capsule Generic drug:  docusate sodium Take 100 mg by mouth 2 (two) times daily.   febuxostat 40 MG tablet Commonly known as:  ULORIC Take 40 mg by mouth daily.   feeding supplement (ENSURE ENLIVE) Liqd Take 237 mLs by mouth 3 (three) times daily between meals.   GENTEAL SEVERE 0.3 % Gel ophthalmic ointment Generic drug:  hypromellose Place 1 application into the left eye as needed (irritation/watering).   ipratropium 0.03 % nasal spray Commonly known as:  ATROVENT Place 1 spray into both nostrils every 4 (four) hours as needed (congestion).   loratadine 10 MG tablet Commonly known as:  CLARITIN Take 10 mg by mouth every evening.   lovastatin 20 MG tablet Commonly known as:  MEVACOR Take 20 mg by mouth every evening.   meloxicam 7.5 MG tablet Commonly known as:  MOBIC Take 7.5 mg by mouth every evening.   tamsulosin 0.4 MG Caps capsule Commonly known as:  FLOMAX Take 1 capsule (0.4 mg total) by mouth daily.       Allergies:  Allergies  Allergen Reactions  . Hydrochlorothiazide     Documented on MAR  . Lisinopril     Documented on MAR    Family History: Family History  Problem Relation Age of Onset  . Kidney disease Neg Hx   . Prostate cancer Neg Hx   . Kidney cancer Neg Hx   . Bladder Cancer Neg Hx     Social History:  reports that he has been smoking Cigarettes.  He has a 75.00 pack-year smoking history. He has never used smokeless tobacco. He reports that he does not drink alcohol or use drugs.  ROS: UROLOGY Frequent Urination?: No Hard to postpone urination?: No Burning/pain with urination?: No Get up at night to urinate?: No Leakage of urine?: No Urine stream starts and stops?: No Trouble starting stream?:  No Do you have to strain to urinate?: No Blood in urine?: No Urinary tract infection?: No Sexually transmitted disease?: No Injury to kidneys or bladder?: No Painful intercourse?: No Weak stream?: No  Erection problems?: No Penile pain?: No  Gastrointestinal Nausea?: No Vomiting?: No Indigestion/heartburn?: No Diarrhea?: No Constipation?: No  Constitutional Fever: No Night sweats?: No Weight loss?: No Fatigue?: No  Skin Skin rash/lesions?: No Itching?: No  Eyes Blurred vision?: No Double vision?: No  Ears/Nose/Throat Sore throat?: No Sinus problems?: No  Hematologic/Lymphatic Swollen glands?: No Easy bruising?: No  Cardiovascular Leg swelling?: No Chest pain?: No  Respiratory Cough?: No Shortness of breath?: No  Endocrine Excessive thirst?: No  Musculoskeletal Back pain?: No Joint pain?: No  Neurological Headaches?: No Dizziness?: No  Psychologic Depression?: No Anxiety?: No  Physical Exam: BP (!) 88/61   Pulse 85   SpO2 98%   Constitutional: Well nourished. Alert and oriented, No acute distress. HEENT: [place] AT, moist mucus membranes. Trachea midline, no masses. Cardiovascular: No clubbing, cyanosis, or edema. Respiratory: Normal respiratory effort, no increased work of breathing. Skin: No rashes, bruises or suspicious lesions. Lymph: No cervical or inguinal adenopathy. Neurologic: Grossly intact, no focal deficits, moving all 4 extremities. Psychiatric: Normal mood and affect.   Laboratory Data: Lab Results  Component Value Date   WBC 4.4 12/02/2016   HGB 12.9 (L) 12/02/2016   HCT 37.8 (L) 12/02/2016   MCV 87.6 12/02/2016   PLT 425 12/02/2016    Lab Results  Component Value Date   CREATININE 0.92 01/13/2017    Lab Results  Component Value Date   TSH 0.454 11/16/2016     Lab Results  Component Value Date   AST 76 (H) 11/28/2016   Lab Results  Component Value Date   ALT 25 11/28/2016   I have reviewed the  labs.   Assessment & Plan:    1. Erectile dysfunction  - not sexually active due to co-morbidities   2. BPH with LUTS  - IPSS score is 7/3, it is slightly worse  - Continue conservative management, avoiding bladder irritants and timed voiding's  - He will continue the tamsulosin  - RTC in one year for IPSS, exam and PVR  3. Hypotensive  - Patient is very somnolent during today's exam and caregiver states that he has been this way for the last several weeks  - She states they do have a meeting this afternoon concerning his health care and she will bring this concern to his healthcare providers attention  Return in about 1 year (around 04/14/2018) for I PSS and exam.  These notes generated with voice recognition software. I apologize for typographical errors.  Michiel Cowboy, PA-C  [facility] [address] [place], [place] [address] [phone]

## 2017-04-14 ENCOUNTER — Encounter: Payer: Self-pay | Admitting: Urology

## 2017-04-14 ENCOUNTER — Ambulatory Visit (INDEPENDENT_AMBULATORY_CARE_PROVIDER_SITE_OTHER): Payer: Medicare HMO | Admitting: Urology

## 2017-04-14 VITALS — BP 88/61 | HR 85

## 2017-04-14 DIAGNOSIS — I959 Hypotension, unspecified: Secondary | ICD-10-CM

## 2017-04-14 DIAGNOSIS — N529 Male erectile dysfunction, unspecified: Secondary | ICD-10-CM

## 2017-04-14 DIAGNOSIS — N401 Enlarged prostate with lower urinary tract symptoms: Secondary | ICD-10-CM

## 2017-04-14 DIAGNOSIS — N138 Other obstructive and reflux uropathy: Secondary | ICD-10-CM | POA: Diagnosis not present

## 2017-04-14 NOTE — Progress Notes (Signed)
Patient came into office from [facility] no one came with him from facility. A Patient care corrdinator showed up for him from [facility] but did not seem to know anything about why he was here or any personal info for vitals. States she was just informed last evening of appointment today. Patient can not stand to walk and was lying in wheel chair asleep.

## 2017-05-18 ENCOUNTER — Ambulatory Visit (INDEPENDENT_AMBULATORY_CARE_PROVIDER_SITE_OTHER): Payer: Medicare HMO | Admitting: Vascular Surgery

## 2017-05-18 ENCOUNTER — Encounter (INDEPENDENT_AMBULATORY_CARE_PROVIDER_SITE_OTHER): Payer: Self-pay | Admitting: Vascular Surgery

## 2017-05-18 ENCOUNTER — Ambulatory Visit (INDEPENDENT_AMBULATORY_CARE_PROVIDER_SITE_OTHER): Payer: Medicare HMO

## 2017-05-18 VITALS — BP 107/64 | HR 69 | Resp 16 | Wt 140.0 lb

## 2017-05-18 DIAGNOSIS — E785 Hyperlipidemia, unspecified: Secondary | ICD-10-CM

## 2017-05-18 DIAGNOSIS — I7025 Atherosclerosis of native arteries of other extremities with ulceration: Secondary | ICD-10-CM | POA: Diagnosis not present

## 2017-05-18 DIAGNOSIS — I10 Essential (primary) hypertension: Secondary | ICD-10-CM

## 2017-05-18 DIAGNOSIS — J449 Chronic obstructive pulmonary disease, unspecified: Secondary | ICD-10-CM | POA: Diagnosis not present

## 2017-05-24 ENCOUNTER — Encounter (INDEPENDENT_AMBULATORY_CARE_PROVIDER_SITE_OTHER): Payer: Self-pay | Admitting: Vascular Surgery

## 2017-05-24 NOTE — Progress Notes (Signed)
MRN : [id number]  [name] MeigsLinnies Shedden Sr. is a 83 y.o. (12-12-1933) male who presents with chief complaint of  Chief Complaint  Patient presents with  . Follow-up    3mth abi  .  History of Present Illness:The patient returns to the office for followup and review of the noninvasive studies. There have been no interval changes in lower extremity symptoms. No interval shortening of the patient's claudication distance or development of rest pain symptoms. No new ulcers or wounds have occurred since the last visit.  There have been no significant changes to the patient's overall health care.  The patient denies amaurosis fugax or recent TIA symptoms. There are no recent neurological changes noted. The patient denies history of DVT, PE or superficial thrombophlebitis. The patient denies recent episodes of angina or shortness of breath.     Current Meds  Medication Sig  . acetaminophen (TYLENOL) 325 MG tablet Take 650 mg by mouth every 6 (six) hours as needed for moderate pain.  Marland Kitchen. albuterol (PROVENTIL HFA;VENTOLIN HFA) 108 (90 Base) MCG/ACT inhaler Inhale 2 puffs into the lungs every 6 (six) hours as needed for wheezing or shortness of breath.  Marland Kitchen. amLODipine (NORVASC) 5 MG tablet Take 5 mg by mouth daily.  Marland Kitchen. aspirin EC 81 MG tablet Take 81 mg by mouth daily.  . clopidogrel (PLAVIX) 75 MG tablet Take 1 tablet (75 mg total) by mouth daily.  . clopidogrel (PLAVIX) 75 MG tablet Take 1 tablet (75 mg total) by mouth daily.  Marland Kitchen. docusate sodium (COLACE) 100 MG capsule Take 100 mg by mouth 2 (two) times daily.  . febuxostat (ULORIC) 40 MG tablet Take 40 mg by mouth daily.  . feeding supplement, ENSURE ENLIVE, (ENSURE ENLIVE) LIQD Take 237 mLs by mouth 3 (three) times daily between meals.  . hypromellose (GENTEAL SEVERE) 0.3 % GEL ophthalmic ointment Place 1 application into the left eye as needed (irritation/watering).   Marland Kitchen. ipratropium (ATROVENT) 0.03 % nasal spray Place 1 spray into both nostrils every 4  (four) hours as needed (congestion).   Marland Kitchen. loratadine (CLARITIN) 10 MG tablet Take 10 mg by mouth every evening.   . lovastatin (MEVACOR) 20 MG tablet Take 20 mg by mouth every evening.  . meloxicam (MOBIC) 7.5 MG tablet Take 7.5 mg by mouth every evening.   . tamsulosin (FLOMAX) 0.4 MG CAPS capsule Take 1 capsule (0.4 mg total) by mouth daily.    Past Medical History:  Diagnosis Date  . Arthritis   . Gout   . Hypertension   . Stroke [facility](HCC)    left sided weakness    Past Surgical History:  Procedure Laterality Date  . HERNIA REPAIR INGUINAL ADULT Right 09/21/2015   Performed by Nadeen LandauSmith, Jarvis Wilton, [facility]  . Lower Extremity Angiography Right 01/13/2017   Performed by Renford DillsSchnier, Elim Peale G, [facility]  . Lower Extremity Angiography Left 09/30/2016   Performed by Gilda CreaseSchnier, Latina CraverGregory G, [facility]    Social History Social History   Tobacco Use  . Smoking status: Current Every Day Smoker    Packs/day: 1.50    Years: 50.00    Pack years: 75.00    Types: Cigarettes  . Smokeless tobacco: Never Used  Substance Use Topics  . Alcohol use: No    Comment: previous heavy drinker now 1x month  . Drug use: No    Family History Family History  Problem Relation Age of Onset  . Kidney  disease Neg Hx   . Prostate cancer Neg Hx   . Kidney cancer Neg Hx   . Bladder Cancer Neg Hx     Allergies  Allergen Reactions  . Hydrochlorothiazide     Documented on MAR  . Lisinopril     Documented on MAR     REVIEW OF SYSTEMS (Negative unless checked)  Constitutional: [] Weight loss  [] Fever  [] Chills Cardiac: [] Chest pain   [] Chest pressure   [] Palpitations   [] Shortness of breath when laying flat   [] Shortness of breath with exertion. Vascular:  [] Pain in legs with walking   [x] Pain in legs at rest  [] History of DVT   [] Phlebitis   [x] Swelling in legs   [] Varicose veins   [] Non-healing ulcers Pulmonary:   [] Uses home oxygen   [] Productive cough    [] Hemoptysis   [] Wheeze  [] COPD   [] Asthma Neurologic:  [] Dizziness   [] Seizures   [] History of stroke   [] History of TIA  [] Aphasia   [] Vissual changes   [] Weakness or numbness in arm   [] Weakness or numbness in leg Musculoskeletal:   [] Joint swelling   [] Joint pain   [] Low back pain Hematologic:  [] Easy bruising  [] Easy bleeding   [] Hypercoagulable state   [] Anemic Gastrointestinal:  [] Diarrhea   [] Vomiting  [] Gastroesophageal reflux/heartburn   [] Difficulty swallowing. Genitourinary:  [] Chronic kidney disease   [] Difficult urination  [] Frequent urination   [] Blood in urine Skin:  [] Rashes   [x] Ulcers  Psychological:  [] History of anxiety   []  History of major depression.  Physical Examination  Vitals:   05/18/17 1519  BP: 107/64  Pulse: 69  Resp: 16  Weight: 140 lb (63.5 kg)   Body mass index is 22.6 kg/m. Gen: WD/WN, NAD Head: [place]/AT, No temporalis wasting.  Ear/Nose/Throat: Hearing grossly intact, nares w/o erythema or drainage Eyes: PER, EOMI, sclera nonicteric.  Neck: Supple, no large masses.   Pulmonary:  Good air movement, no audible wheezing bilaterally, no use of accessory muscles.  Cardiac: RRR, no JVD Vascular: ulcer of right foot almost helaed Vessel Right Left  Radial Palpable Palpable  PT Not Palpable Not Palpable  DP Not Palpable Not Palpable  Gastrointestinal: Non-distended. No guarding/no peritoneal signs.  Musculoskeletal: M/S 5/5 throughout.  No deformity or atrophy.  Neurologic: CN 2-12 intact. Symmetrical.  Speech is fluent. Motor exam as listed above. Psychiatric: Judgment intact, Mood & affect appropriate for pt's clinical situation. Dermatologic: No rashes or ulcers noted.  No changes consistent with cellulitis. Lymph : No lichenification or skin changes of chronic lymphedema.  CBC Lab Results  Component Value Date   WBC 4.4 12/02/2016   HGB 12.9 (L) 12/02/2016   HCT 37.8 (L) 12/02/2016   MCV 87.6 12/02/2016   PLT 425 12/02/2016    BMET      Component Value Date/Time   NA 134 (L) 12/04/2016 0427   NA 135 (L) 01/03/2014 1959   K 4.8 12/04/2016 0427   K 4.5 01/03/2014 1959   CL 101 12/04/2016 0427   CL 98 01/03/2014 1959   CO2 26 12/04/2016 0427   CO2 28 01/03/2014 1959   GLUCOSE 112 (H) 12/04/2016 0427   GLUCOSE 84 01/03/2014 1959   BUN 25 (H) 01/13/2017 1053   BUN 30 (H) 01/03/2014 1959   CREATININE 0.92 01/13/2017 1053   CREATININE 1.20 01/03/2014 1959   CALCIUM 9.5 12/04/2016 0427   CALCIUM 9.8 01/03/2014 1959   GFRNONAA >60 01/13/2017 1053   GFRNONAA 58 (L) 01/03/2014 1959  GFRAA >60 01/13/2017 1053   GFRAA >60 01/03/2014 1959   CrCl cannot be calculated (Patient's most recent lab result is older than the maximum 21 days allowed.).  COAG Lab Results  Component Value Date   INR 1.03 09/11/2015    Radiology No results found.    Assessment/Plan 1. Atherosclerosis of native arteries of the extremities with ulceration (HCC) Recommend:  The patient is status post successful angiogram with intervention.  The patient reports that the claudication symptoms and leg pain is essentially gone.   The patient denies lifestyle limiting changes at this point in time.  No further invasive studies, angiography or surgery at this time The patient should continue walking and begin a more formal exercise program.  The patient should continue antiplatelet therapy and aggressive treatment of the lipid abnormalities  Smoking cessation was again discussed  The patient should continue wearing graduated compression socks 10-15 mmHg strength to control the mild edema.  Patient should undergo noninvasive studies as ordered. The patient will follow up with me after the studies.   - VAS [place] ABI WITH/WO TBI; Future  2. Essential hypertension Continue antihypertensive medications as already ordered, these medications have been reviewed and there are no changes at this time.   3. Chronic obstructive pulmonary disease,  unspecified COPD type (HCC) Continue pulmonary medications and aerosols as already ordered, these medications have been reviewed and there are no changes at this time.    4. Hyperlipidemia, unspecified hyperlipidemia type Continue statin as ordered and reviewed, no changes at this time     Levora Dredge, MD  05/24/2017 3:06 PM

## 2017-06-05 IMAGING — MR MR HEAD W/O CM
11 series · 40 of 48 positions shown · non-contrast
Comparison: CT head 11/16/2016.  CTA head and neck 11/16/2016

CLINICAL DATA: Dizziness and giddiness.

EXAM:
MRI HEAD WITHOUT CONTRAST
MRA HEAD WITHOUT CONTRAST
TECHNIQUE: Multiplanar, multiecho pulse sequences of the brain and surrounding
structures were obtained without intravenous contrast. Angiographic
images of the head were obtained using MRA technique without
contrast.

[Series 3: DWI · axial · 4.0mm · 0.94mm/px · z∈[-32,+114]mm · 3 of 45 slices shown (1 of 2)]
[im 1/45]
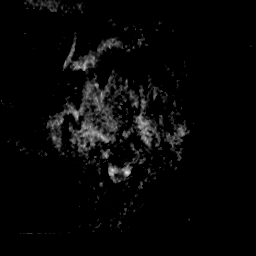
[im 23/45]
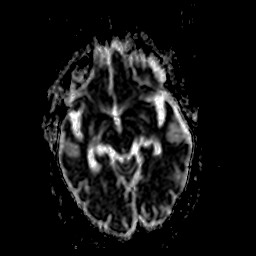
[im 45/45]
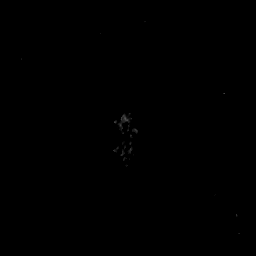

[Series 4: ax (id) · axial · 4.0mm · 0.94mm/px · z∈[-32,+114]mm · 3 of 43 slices shown]
[im 1/43]
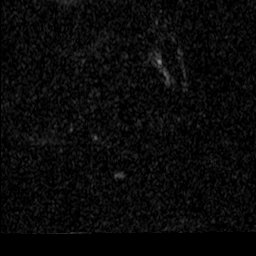
[im 22/43]
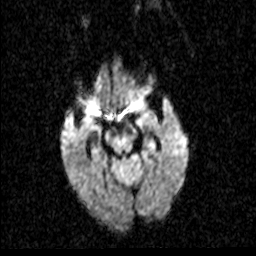
[im 43/43]
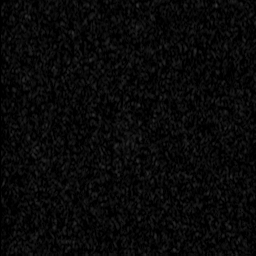

[Series 6: DWI · coronal · 5.0mm · 1.80mm/px · 4 of 39 slices shown (2 of 2)]
[im 1/39]
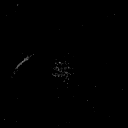
[im 13/39]
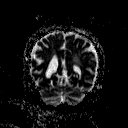
[im 26/39]
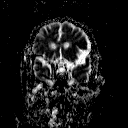
[im 39/39]
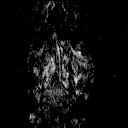

[Series 7: cor (id) · coronal · 5.0mm · 1.80mm/px · 4 of 37 slices shown]
[im 1/37]
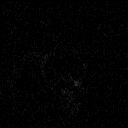
[im 13/37]
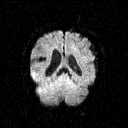
[im 25/37]
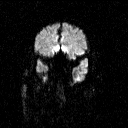
[im 37/37]
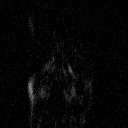

[Series 8: FLAIR · axial · 5.0mm · 0.90mm/px · z∈[-33,+108]mm · 3 of 27 slices shown]
[im 1/27]
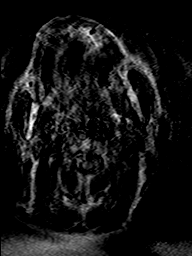
[im 14/27]
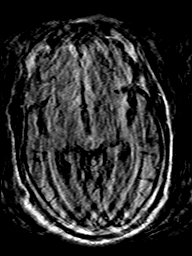
[im 27/27]
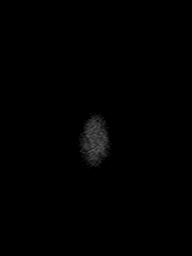

[Series 9: T2 · axial · 5.0mm · 0.45mm/px · z∈[-31,+109]mm · 3 of 27 slices shown (1 of 3)]
[im 1/27]
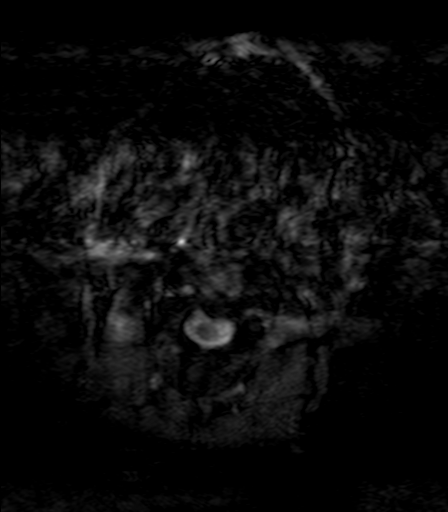
[im 14/27]
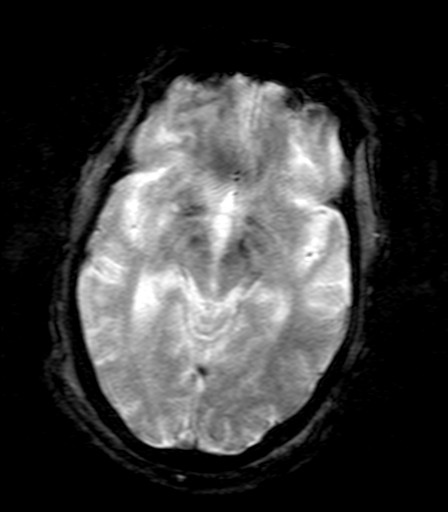
[im 27/27]
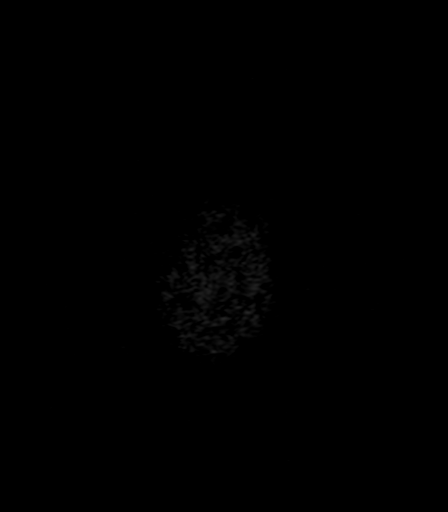

[Series 11: T1 · sagittal · 5.0mm · 0.45mm/px · 3 of 29 slices shown (1 of 2)]
[im 1/29]
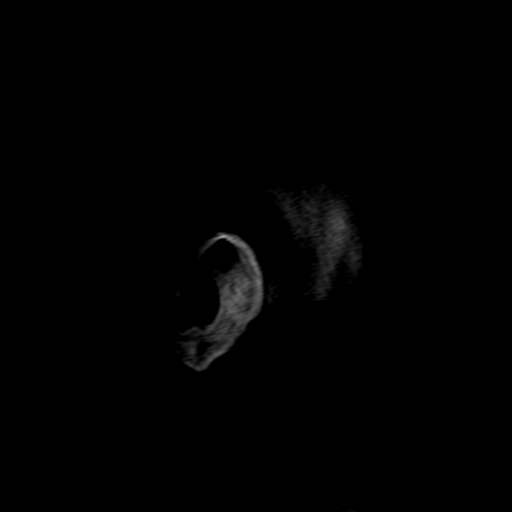
[im 15/29]
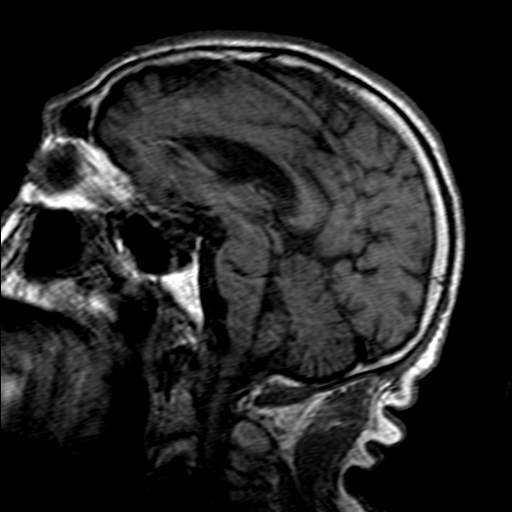
[im 29/29]
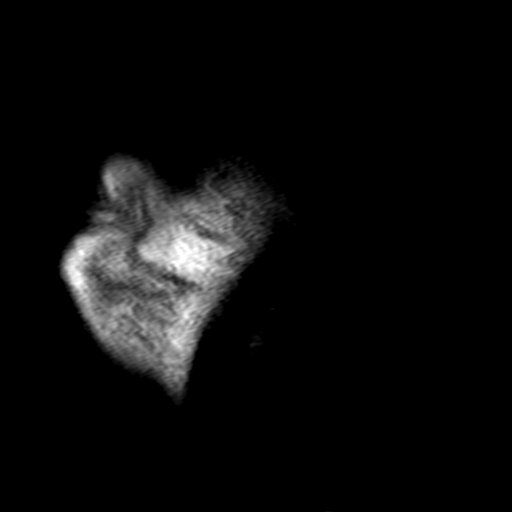

[Series 12: T1 · axial · 3.0mm · 0.45mm/px · z∈[-35,+112]mm · 6 of 60 slices shown (2 of 2)]
[im 1/60]
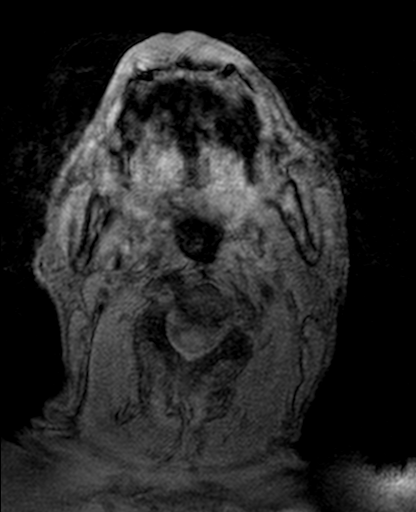
[im 12/60]
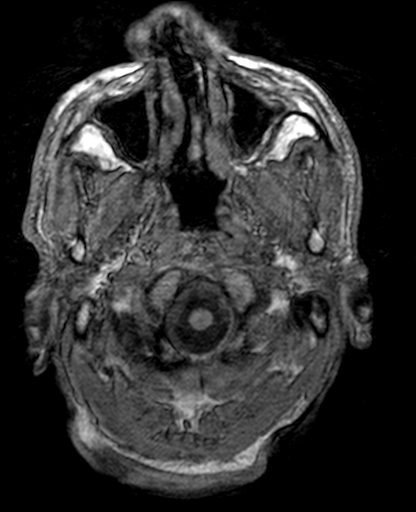
[im 24/60]
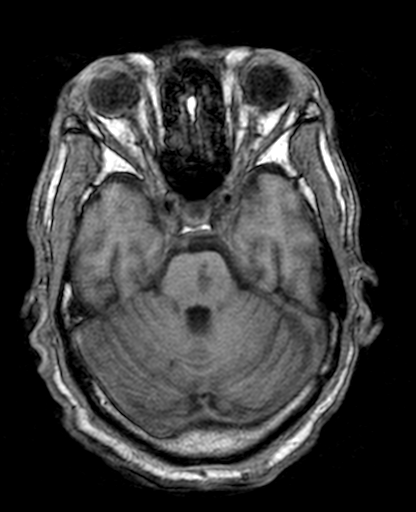
[im 36/60]
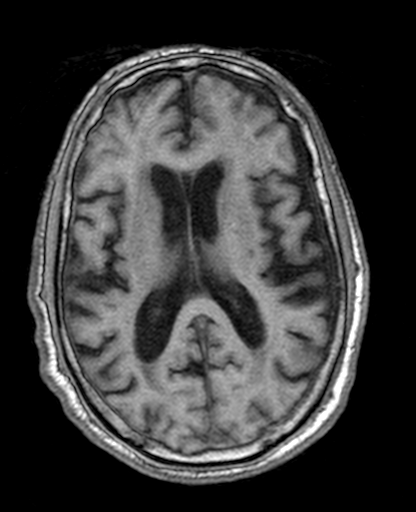
[im 48/60]
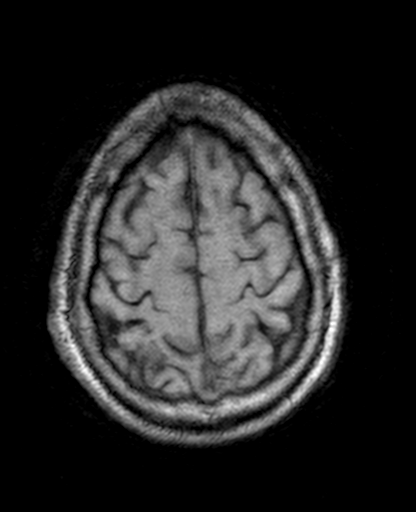
[im 60/60]
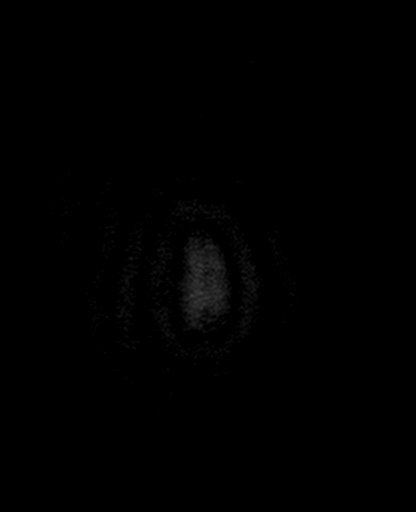

[Series 13: T2 · coronal · 5.0mm · 0.90mm/px · 3 of 29 slices shown (2 of 3)]
[im 1/29]
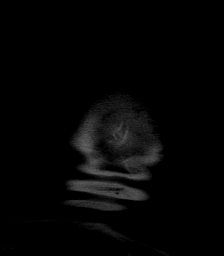
[im 15/29]
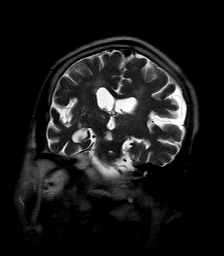
[im 29/29]
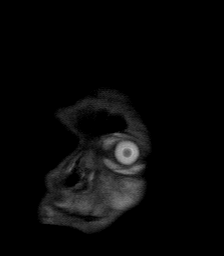

[Series 14: T2 · axial · 5.0mm · 0.90mm/px · z∈[-31,+109]mm · 3 of 27 slices shown (3 of 3)]
[im 1/27]
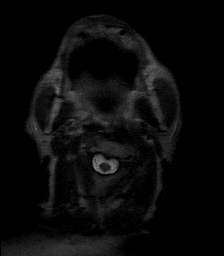
[im 14/27]
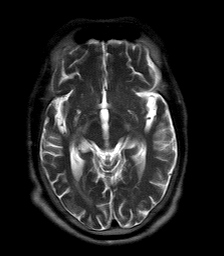
[im 27/27]
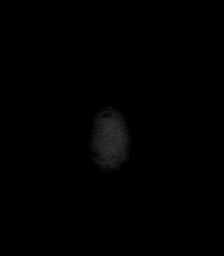

[Series 15: TOF · axial · 0.8mm · 0.39mm/px · z∈[-24,+25]mm · 5 of 128 slices shown]
[im 1/128]
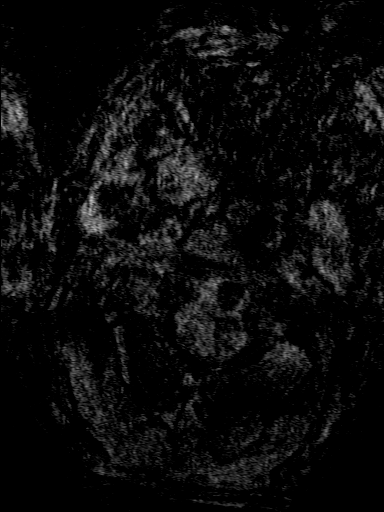
[im 22/128]
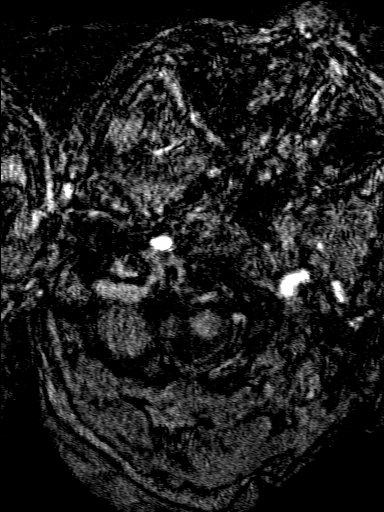
[im 43/128]
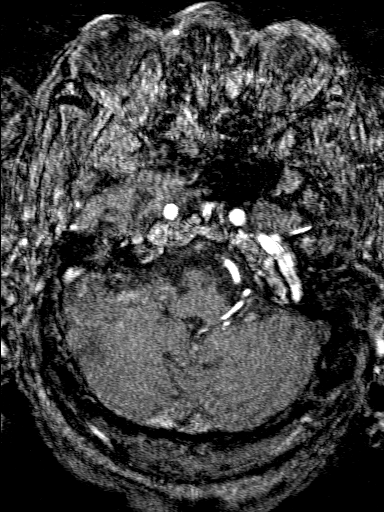
[im 53/128]
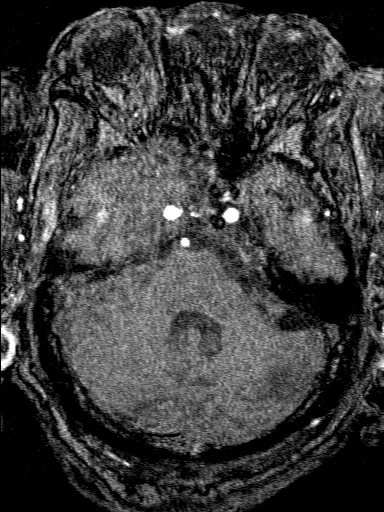
[im 75/128]
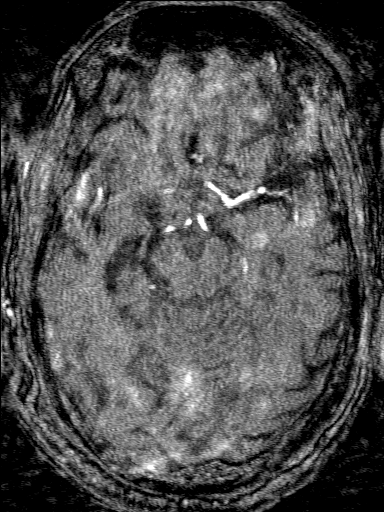

[40 of 48 positions shown; findings below may reference images not displayed]

FINDINGS: MRI HEAD FINDINGS

Image quality degraded by moderate motion.

Brain: Negative for acute infarct. Chronic ischemic changes in the
white matter. Large chronic lacunar infarction in the left pons.
Small chronic infarcts in the right cerebellum. Negative for
hemorrhage or mass. Generalized atrophy.

Vascular: Normal arterial flow voids.

Skull and upper cervical spine: Negative

Sinuses/Orbits: Mild mucosal edema in the paranasal sinuses. Normal
orbit.

Other: None

MRA HEAD FINDINGS

Image quality degraded by moderate motion. Both vertebral arteries
are patent. The basilar is patent. Posterior cerebral arteries
patent bilaterally. Fetal origin of the posterior cerebral artery
bilaterally.

Atherosclerotic irregularity and moderate stenosis in the cavernous
carotid bilaterally. Mild to moderate stenosis of the right M1
segment. Right middle cerebral artery branches patent. Left middle
cerebral artery widely patent. Anterior distal artery patent
bilaterally.

Aneurysm of the right supraclinoid internal carotid artery is best
seen on the prior CTA.
IMPRESSION: Negative for acute infarct. Chronic ischemic change in the white
matter and left pons

MRA is significantly degraded by motion. No large vessel occlusion.
Mild to moderate disease cavernous carotid bilaterally and mild
stenosis right M1 segment. Right supraclinoid internal carotid
artery aneurysm. See CTA report

## 2017-06-12 ENCOUNTER — Emergency Department
Admission: EM | Admit: 2017-06-12 | Discharge: 2017-06-13 | Disposition: A | Discharge status: Home / Self Care | Payer: Medicare HMO | Attending: Emergency Medicine | Admitting: Emergency Medicine

## 2017-06-12 DIAGNOSIS — M7918 Myalgia, other site: Secondary | ICD-10-CM

## 2017-06-12 DIAGNOSIS — Z7982 Long term (current) use of aspirin: Secondary | ICD-10-CM | POA: Diagnosis not present

## 2017-06-12 DIAGNOSIS — J449 Chronic obstructive pulmonary disease, unspecified: Secondary | ICD-10-CM | POA: Diagnosis not present

## 2017-06-12 DIAGNOSIS — M79605 Pain in left leg: Secondary | ICD-10-CM | POA: Diagnosis not present

## 2017-06-12 DIAGNOSIS — F17210 Nicotine dependence, cigarettes, uncomplicated: Secondary | ICD-10-CM | POA: Diagnosis not present

## 2017-06-12 DIAGNOSIS — Z8673 Personal history of transient ischemic attack (TIA), and cerebral infarction without residual deficits: Secondary | ICD-10-CM | POA: Insufficient documentation

## 2017-06-12 DIAGNOSIS — I10 Essential (primary) hypertension: Secondary | ICD-10-CM | POA: Diagnosis not present

## 2017-06-12 DIAGNOSIS — R51 Headache: Secondary | ICD-10-CM | POA: Diagnosis present

## 2017-06-12 DIAGNOSIS — Z791 Long term (current) use of non-steroidal anti-inflammatories (NSAID): Secondary | ICD-10-CM | POA: Insufficient documentation

## 2017-06-12 DIAGNOSIS — Z7902 Long term (current) use of antithrombotics/antiplatelets: Secondary | ICD-10-CM | POA: Diagnosis not present

## 2017-06-12 DIAGNOSIS — Z79899 Other long term (current) drug therapy: Secondary | ICD-10-CM | POA: Insufficient documentation

## 2017-06-12 DIAGNOSIS — W19XXXA Unspecified fall, initial encounter: Secondary | ICD-10-CM

## 2017-06-12 NOTE — ED Triage Notes (Signed)
Pt arrived via EMS from [facility] with complaints of an unwitnessed fall. Pt reports that he fell out of wheelchair hitting the top of his head and his left knee. Pt favors his right side and wont allow you touch his left knee. Pt has an 18 gauge in left forearm. VS per EMS BP-157/60 [id number]%. EMS reported that pt is "positive LOC". Pt was not answering questions when EMS arrived. Pt is alert and oriented X 4 at this moment.

## 2017-06-13 ENCOUNTER — Emergency Department: Payer: Medicare HMO

## 2017-06-13 LAB — COMPREHENSIVE METABOLIC PANEL
ALT: 14 U/L — AB (ref 17–63)
AST: 22 U/L (ref 15–41)
Albumin: 3 g/dL — ABNORMAL LOW (ref 3.5–5.0)
Alkaline Phosphatase: 79 U/L (ref 38–126)
Anion gap: 7 (ref 5–15)
BILIRUBIN TOTAL: 0.2 mg/dL — AB (ref 0.3–1.2)
BUN: 32 mg/dL — ABNORMAL HIGH (ref 6–20)
CHLORIDE: 107 mmol/L (ref 101–111)
CO2: 25 mmol/L (ref 22–32)
CREATININE: 1.06 mg/dL (ref 0.61–1.24)
Calcium: 9 mg/dL (ref 8.9–10.3)
Glucose, Bld: 79 mg/dL (ref 65–99)
Potassium: 4.5 mmol/L (ref 3.5–5.1)
Sodium: 139 mmol/L (ref 135–145)
TOTAL PROTEIN: 6.8 g/dL (ref 6.5–8.1)

## 2017-06-13 LAB — CBC WITH DIFFERENTIAL/PLATELET
Basophils Absolute: 0.1 10*3/uL (ref 0–0.1)
Basophils Relative: 1 %
EOS PCT: 12 %
Eosinophils Absolute: 0.7 10*3/uL (ref 0–0.7)
HEMATOCRIT: 30.5 % — AB (ref 40.0–52.0)
Hemoglobin: 10.1 g/dL — ABNORMAL LOW (ref 13.0–18.0)
LYMPHS ABS: 1.9 10*3/uL (ref 1.0–3.6)
LYMPHS PCT: 32 %
MCH: 28.3 pg (ref 26.0–34.0)
MCHC: 33 g/dL (ref 32.0–36.0)
MCV: 85.8 fL (ref 80.0–100.0)
MONO ABS: 0.8 10*3/uL (ref 0.2–1.0)
Monocytes Relative: 14 %
Neutro Abs: 2.5 10*3/uL (ref 1.4–6.5)
Neutrophils Relative %: 41 %
PLATELETS: 248 10*3/uL (ref 150–440)
RBC: 3.56 MIL/uL — ABNORMAL LOW (ref 4.40–5.90)
RDW: 15.1 % — AB (ref 11.5–14.5)
WBC: 6 10*3/uL (ref 3.8–10.6)

## 2017-06-13 MED ORDER — ONDANSETRON HCL 4 MG/2ML IJ SOLN
4.0000 mg | INTRAMUSCULAR | Status: AC
  Administered 2017-06-13: 4 mg via INTRAVENOUS
  Filled 2017-06-13: qty 2

## 2017-06-13 MED ORDER — MORPHINE SULFATE (PF) 4 MG/ML IV SOLN
4.0000 mg | Freq: Once | INTRAVENOUS | Status: AC
  Administered 2017-06-13: 4 mg via INTRAVENOUS
  Filled 2017-06-13: qty 1

## 2017-06-13 NOTE — ED Notes (Signed)
Patient cleaned up and changed into new diaper.

## 2017-06-13 NOTE — Discharge Instructions (Signed)

## 2017-06-13 NOTE — ED Provider Notes (Signed)
[facility] Emergency Department Provider Note  ____________________________________________   First MD Initiated Contact with Patient 06/13/17 0010     (approximate)  I have reviewed the triage vital signs and the nursing notes.   HISTORY  Chief Complaint Fall  History is limited by the patient being a poor historian  HPI [name] Schill Sr. is a 83 y.o. male with multiple chronic issues including a stroke which resulted in left-sided weakness.  He was brought by EMS for evaluation after a fall.  Reportedly he fell out of his wheelchair and struck his head and is complaining of pain in his left leg.  No other details are available at this time although the patient states that his leg hurts and his head hurts but he is fine.  He is unable to quantify the severity of the pain.  He denies chest pain or shortness of breath as well as abdominal pain.  Past Medical History:  Diagnosis Date  . Arthritis   . Gout   . Hypertension   . Stroke [facility])    left sided weakness    Patient Active Problem List   Diagnosis Date Noted  . Atherosclerosis of native arteries of the extremities with ulceration (HCC) 01/04/2017  . Chronic neurogenic ulcer of leg, right, with necrosis of muscle (HCC) 01/04/2017  . Essential hypertension 01/04/2017  . COPD (chronic obstructive pulmonary disease) (HCC) 01/04/2017  . Hyperlipidemia 12/30/2016  . Dehydration 11/28/2016  . Acute renal failure (ARF) (HCC) 11/28/2016  . Hyperkalemia 11/28/2016  . Altered mental status 11/28/2016  . Gait instability 11/17/2016  . Lower extremity pain, left 09/17/2016  . PAD (peripheral artery disease) (HCC) 09/17/2016  . Tobacco dependence 09/17/2016    Past Surgical History:  Procedure Laterality Date  . INGUINAL HERNIA REPAIR Right 09/21/2015   Procedure: HERNIA REPAIR INGUINAL ADULT;  Surgeon: [name], MD;  Location: [facility] ORS;  Service: General;  Laterality: Right;  . LOWER  EXTREMITY ANGIOGRAPHY Left 09/30/2016   Procedure: Lower Extremity Angiography;  Surgeon: [name], MD;  Location: [facility] INVASIVE CV LAB;  Service: Cardiovascular;  Laterality: Left;  . LOWER EXTREMITY ANGIOGRAPHY Right 01/13/2017   Procedure: Lower Extremity Angiography;  Surgeon: [name], MD;  Location: [facility] INVASIVE CV LAB;  Service: Cardiovascular;  Laterality: Right;    Prior to Admission medications   Medication Sig Start Date End Date Taking? Authorizing Provider  acetaminophen (TYLENOL) 325 MG tablet Take 650 mg by mouth every 6 (six) hours as needed for moderate pain.    [provider]  albuterol (PROVENTIL HFA;VENTOLIN HFA) 108 (90 Base) MCG/ACT inhaler Inhale 2 puffs into the lungs every 6 (six) hours as needed for wheezing or shortness of breath. 02/18/16   Minna Antis, MD  amLODipine (NORVASC) 5 MG tablet Take 5 mg by mouth daily.    [provider]  aspirin EC 81 MG tablet Take 81 mg by mouth daily.    [provider]  clopidogrel (PLAVIX) 75 MG tablet Take 1 tablet (75 mg total) by mouth daily. 10/01/16   Schnier, Latina Craver, MD  clopidogrel (PLAVIX) 75 MG tablet Take 1 tablet (75 mg total) by mouth daily. 01/13/17   Schnier, Latina Craver, MD  docusate sodium (COLACE) 100 MG capsule Take 100 mg by mouth 2 (two) times daily.    [provider]  febuxostat (ULORIC) 40 MG tablet Take 40 mg by mouth daily.    [provider]  feeding supplement, ENSURE ENLIVE, (ENSURE  ENLIVE) LIQD Take 237 mLs by mouth 3 (three) times daily between meals. 11/18/16   Enedina Finner, MD  hypromellose (GENTEAL SEVERE) 0.3 % GEL ophthalmic ointment Place 1 application into the left eye as needed (irritation/watering).     [provider]  ipratropium (ATROVENT) 0.03 % nasal spray Place 1 spray into both nostrils every 4 (four) hours as needed (congestion).     [provider]  loratadine (CLARITIN) 10 MG tablet Take 10 mg by mouth  every evening.     [provider]  lovastatin (MEVACOR) 20 MG tablet Take 20 mg by mouth every evening.    [provider]  meloxicam (MOBIC) 7.5 MG tablet Take 7.5 mg by mouth every evening.     [provider]  tamsulosin (FLOMAX) 0.4 MG CAPS capsule Take 1 capsule (0.4 mg total) by mouth daily. 03/14/16   Michiel Cowboy A, PA-C    Allergies Hydrochlorothiazide and Lisinopril  Family History  Problem Relation Age of Onset  . Kidney disease Neg Hx   . Prostate cancer Neg Hx   . Kidney cancer Neg Hx   . Bladder Cancer Neg Hx     Social History Social History   Tobacco Use  . Smoking status: Current Every Day Smoker    Packs/day: 1.50    Years: 50.00    Pack years: 75.00    Types: Cigarettes  . Smokeless tobacco: Never Used  Substance Use Topics  . Alcohol use: No    Comment: previous heavy drinker now 1x month  . Drug use: No    Review of Systems Constitutional: No fever/chills Cardiovascular: Denies chest pain. Respiratory: Denies shortness of breath. Gastrointestinal: No abdominal pain.  No nausea Musculoskeletal: Pain in left leg Neurological: Headache after fall   ____________________________________________   PHYSICAL EXAM:  VITAL SIGNS: ED Triage Vitals  Enc Vitals Group     BP 06/12/17 2338 135/69     Pulse Rate 06/12/17 2338 89     Resp 06/12/17 2338 18     Temp 06/12/17 2338 (!) 97.4 F (36.3 C)     Temp Source 06/12/17 2338 Oral     SpO2 06/12/17 2338 95 %     Weight 06/12/17 2340 65.8 kg (145 lb)     Height 06/12/17 2340 1.676 m (5\' 6" )     Head Circumference --      Peak Flow --      Pain Score 06/12/17 2337 10     Pain Loc --      Pain Edu? --      Excl. in GC? --     Constitutional: Alert, there is questions, appears chronically ill but in no acute distress Head: Atraumatic. Cardiovascular: Normal rate, regular rhythm. Good peripheral circulation. Grossly normal heart sounds. Respiratory: Normal  respiratory effort.  No retractions. Lungs CTAB. Gastrointestinal: Soft and nontender. No distention.  Musculoskeletal: Contractured left leg, bent at the knee, chronic according to facility.  I palpated the patient's extremities from proximal extremity to distal and at no point did he have any tenderness in any extremity.  He is not able to extend his left leg at baseline. Neurologic: Chronic left-sided weakness after stroke, patient limited in his ability to participate with exam Skin:  Skin is warm, dry and intact. No rash noted.  ____________________________________________   LABS (all labs ordered are listed, but only abnormal results are displayed)  Labs Reviewed  CBC WITH DIFFERENTIAL/PLATELET - Abnormal; Notable for the following components:  Result Value   RBC 3.56 (*)    Hemoglobin 10.1 (*)    HCT 30.5 (*)    RDW 15.1 (*)    All other components within normal limits  COMPREHENSIVE METABOLIC PANEL - Abnormal; Notable for the following components:   BUN 32 (*)    Albumin 3.0 (*)    ALT 14 (*)    Total Bilirubin 0.2 (*)    All other components within normal limits   ____________________________________________  EKG  None - EKG not ordered by ED physician ____________________________________________  RADIOLOGY   Dg Knee 2 Views Left  Result Date: 06/13/2017 CLINICAL DATA:  83 y/o  M; fall with left knee pain. EXAM: LEFT KNEE - 1-2 VIEW COMPARISON:  11/28/2016 left knee radiographs. FINDINGS: No acute fracture or dislocation identified. Severe bilateral femorotibial compartment and patellofemoral compartment joint space narrowing and tricompartmental osteophytes. Bones are demineralized. Vascular calcifications. IMPRESSION: No acute fracture identified. 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 Electronically Signed   By: Mitzi HansenLance  Furusawa-Stratton M.D.   On: 06/13/2017 00:54   Ct Head Wo Contrast  Result Date: 06/13/2017 CLINICAL DATA:  Pain after  fall EXAM: CT HEAD WITHOUT CONTRAST CT CERVICAL SPINE WITHOUT CONTRAST TECHNIQUE: Multidetector CT imaging of the head and cervical spine was performed following the standard protocol without intravenous contrast. Multiplanar CT image reconstructions of the cervical spine were also generated. COMPARISON:  Nov 16, 2016 FINDINGS: CT HEAD FINDINGS Brain: No subdural, epidural, or subarachnoid hemorrhage. Ventricles and sulci are prominent but stable. A lacunar infarct in the pons is stable. The cerebellum, brainstem, and basal cisterns are stable. White matter changes are unchanged. No acute cortical ischemia infarct. No mass effect or midline shift. Vascular: Calcified atherosclerosis in the intracranial carotids. Skull: Normal. Negative for fracture or focal lesion. Sinuses/Orbits: Mucosal thickening in the ethmoid and maxillary sinuses with no air-fluid levels. Opacification of right mastoid air cells and middle ears stable. The left mastoid air cells and middle ear well aerated. Other: None. CT CERVICAL SPINE FINDINGS Alignment: C5 demonstrates 4 mm of anterolisthesis versus C6. No other malalignment. Skull base and vertebrae: No acute fracture. No primary bone lesion or focal pathologic process. Soft tissues and spinal canal: No prevertebral fluid or swelling. No visible canal hematoma. Disc levels:  Multilevel degenerative changes. Upper chest: Emphysematous changes and scarring in the lung apices, right greater than left. Other: No other abnormalities. IMPRESSION: 1. No acute intracranial process. 2. Anterolisthesis of C5 versus C6 is favored to be due to facet degenerative changes. No fractures are seen. Electronically Signed   By: Gerome Samavid  Williams III M.D   On: 06/13/2017 01:13   Ct Cervical Spine Wo Contrast  Result Date: 06/13/2017 CLINICAL DATA:  Pain after fall EXAM: CT HEAD WITHOUT CONTRAST CT CERVICAL SPINE WITHOUT CONTRAST TECHNIQUE: Multidetector CT imaging of the head and cervical spine was  performed following the standard protocol without intravenous contrast. Multiplanar CT image reconstructions of the cervical spine were also generated. COMPARISON:  Nov 16, 2016 FINDINGS: CT HEAD FINDINGS Brain: No subdural, epidural, or subarachnoid hemorrhage. Ventricles and sulci are prominent but stable. A lacunar infarct in the pons is stable. The cerebellum, brainstem, and basal cisterns are stable. White matter changes are unchanged. No acute cortical ischemia infarct. No mass effect or midline shift. Vascular: Calcified atherosclerosis in the intracranial carotids. Skull: Normal. Negative for fracture or focal lesion. Sinuses/Orbits: Mucosal thickening in the ethmoid and maxillary sinuses with no air-fluid levels. Opacification of right mastoid air cells and  middle ears stable. The left mastoid air cells and middle ear well aerated. Other: None. CT CERVICAL SPINE FINDINGS Alignment: C5 demonstrates 4 mm of anterolisthesis versus C6. No other malalignment. Skull base and vertebrae: No acute fracture. No primary bone lesion or focal pathologic process. Soft tissues and spinal canal: No prevertebral fluid or swelling. No visible canal hematoma. Disc levels:  Multilevel degenerative changes. Upper chest: Emphysematous changes and scarring in the lung apices, right greater than left. Other: No other abnormalities. IMPRESSION: 1. No acute intracranial process. 2. Anterolisthesis of C5 versus C6 is favored to be due to facet degenerative changes. No fractures are seen. Electronically Signed   By: Gerome Samavid  Williams III M.D   On: 06/13/2017 01:13    ____________________________________________   PROCEDURES  Critical Care performed: No   Procedure(s) performed:   Procedures   ____________________________________________   INITIAL IMPRESSION / ASSESSMENT AND PLAN / ED COURSE  As part of my medical decision making, I reviewed the following data within the electronic MEDICAL RECORD NUMBERReviewed old chart,  reviewed imaging reports    Differential diagnosis includes, but is not limited to, acute intracranial injury from fall, acute fracture dislocation from fall, infection, ACS, etc.  However the patient is alert enough and responsive enough to tell me that nothing is hurting except for his head and his leg.  The nurses going to check with the facility to make sure that he has chronic limitation of the left leg but I believe that to be the case.  Given that he is a poor historian and chronically ill I will evaluate with CT imaging of his head and cervical spine as well as x-rays of his left knee, but I doubt that there is an acute or emergent condition at this time.  I anticipate he will be able to be discharged back to his facility.  Clinical Course as of Jun 13 352  Sat Jun 13, 2017  0058 No evidence of fracture nor dislocation of left knee DG Knee 2 Views Left [CF]  0117 No acute findings on CT head nor C-spine CT Head Wo Contrast [CF]  0203 Lab work is all reassuring.  The ED nurse checked at the facility and verified that the patient always keeps his left leg bent up and does not use it.  This is reassuring and I do not believe he has any acute or emergent medical issues tonight.  The patient answers my questions and states "I'm fine, nothing's broken," and wants to go home to go to bed.  Will discharge shortly.  [CF]    Clinical Course User Index [CF] Loleta RoseForbach, Fadumo Heng, MD    ____________________________________________  FINAL CLINICAL IMPRESSION(S) / ED DIAGNOSES  Final diagnoses:  Fall, initial encounter  Musculoskeletal pain     MEDICATIONS GIVEN DURING THIS VISIT:  Medications  morphine 4 MG/ML injection 4 mg (4 mg Intravenous Given 06/13/17 0016)  ondansetron (ZOFRAN) injection 4 mg (4 mg Intravenous Given 06/13/17 0016)     ED Discharge Orders    None       Note:  This document was prepared using Dragon voice recognition software and may include unintentional dictation  errors.    Loleta RoseForbach, Antonietta Lansdowne, MD 06/13/17 [phone]

## 2017-11-16 ENCOUNTER — Encounter (INDEPENDENT_AMBULATORY_CARE_PROVIDER_SITE_OTHER): Payer: Medicare HMO

## 2017-11-16 ENCOUNTER — Ambulatory Visit (INDEPENDENT_AMBULATORY_CARE_PROVIDER_SITE_OTHER): Payer: Medicare HMO | Admitting: Vascular Surgery

## 2018-04-14 ENCOUNTER — Encounter: Payer: Self-pay | Admitting: Urology

## 2018-04-14 ENCOUNTER — Ambulatory Visit: Payer: Medicare HMO | Admitting: Urology

## 2018-07-23 ENCOUNTER — Encounter: Payer: Self-pay | Admitting: Nurse Practitioner

## 2018-07-23 ENCOUNTER — Non-Acute Institutional Stay: Payer: Medicare HMO | Admitting: Nurse Practitioner

## 2018-07-23 VITALS — BP 101/59 | HR 87 | Temp 97.8°F | Resp 18 | Wt 112.4 lb

## 2018-07-23 DIAGNOSIS — R5381 Other malaise: Secondary | ICD-10-CM

## 2018-07-23 DIAGNOSIS — Z515 Encounter for palliative care: Secondary | ICD-10-CM

## 2018-07-23 DIAGNOSIS — R413 Other amnesia: Secondary | ICD-10-CM | POA: Insufficient documentation

## 2018-07-23 NOTE — Progress Notes (Signed)
[facility] Telephone: [phone] Fax: [phone]  PATIENT NAME: [name] Sr. DOB: 1933/12/05 MRN: [id number]  PRIMARY CARE PROVIDER:   Dr Jamas LavSmith REFERRING PROVIDER:  Dr [facility] RESPONSIBLE PARTY:   Valarie [employer] [phone] DSS [facility] Guardian  ASSESSMENT:     I visited and observed [name]. We talked about purpose for palliative care visit. We talked about symptoms of pain which he denies. We talked about his functional level which he shared he's doing just fine. He wanted to be pushed to the smoking patio. We talked about smoking sensation and he refused, commenting he "wants to go smoke". He was cooperative with assessment those short with his answers as repetitively asked to go to the smoking patio. With weight gain at present time he is currently stable. Will continue to follow monitor him with palliative care. He does have a most form completed with goals of care to focus on comfort with DNR in place and no feeding tube. I called and left message for [name] DSS guardian for update on PC visit   BMI 17.1 10 / 17 / 2019 weight 106.5 lbs 12 / 5 / 2018 weight 110.3 lbs 1 / 3 / 2020 weight 112.4 lbs  RECOMMENDATIONS and PLAN:   1. Palliative care encounter Z51.5; Palliative medicine team will continue to support patient, patient's family, and medical team. Visit consisted of counseling and education dealing with the complex and emotionally intense issues of symptom management and palliative care in the setting of serious and potentially life-threatening illness  2. Memory loss R41.3 secondary to late onset CVA. Continue with supportive measures as chronic disease remains Progressive.  3. Debility R53.81 secondary to late onset CVA progressive, encourage passive rom; encourage to transfer to w/c for mobility  I spent 60 minutes providing this consultation,  from 2:00pm to 3:00pm. More than 50% of the time in this  consultation was spent coordinating communication.   HISTORY OF PRESENT ILLNESS:  Bora Endres Sr. is a 85 y.o. year old male with multiple medical problems including  Late onset CVA, dysphagia, COPD, chronic kidney disease, hypertension, peripheral vascular disease, chronic pain, coronary artery disease, BPH, gout, lingual hernia repair, tobacco dependence. [name] continues to reside at skilled [facility]. He does require transfer to the wheelchair where he is minimally Mobile on his own. He is able to propel with his arms. He is ADL dependent with incontinence. He does feed himself after try set up an appetite has been good with a weight gain. No recent wounds, hospitalizations. He has had a recent fall with no noted injury. He is currently being treated with antibiotic therapy and duoneb treatments for pneumonia. He does continue to refuse medication at times, reviews assessments, and care. He does become agitated with staff but there's redirectable. He does continue to go to the smoking and decline smoking sensation. He is able to make his needs no. At present he is sitting in the wheelchair in the hall. He appears then, debilitated but comfortable. And visitors present. Palliative Care was asked to help address goals of care.   CODE STATUS: DNR  PPS: 40% HOSPICE ELIGIBILITY/DIAGNOSIS: TBD  PAST MEDICAL HISTORY:  Past Medical History:  Diagnosis Date  . Arthritis   . Gout   . Hypertension   . Stroke [facility](HCC)    left sided weakness    SOCIAL HX:  Social History   Tobacco Use  . Smoking status: Current Every Day Smoker  Packs/day: 1.50    Years: 50.00    Pack years: 75.00    Types: Cigarettes  . Smokeless tobacco: Never Used  Substance Use Topics  . Alcohol use: No    Comment: previous heavy drinker now 1x month    ALLERGIES:  Allergies  Allergen Reactions  . Hydrochlorothiazide     Documented on MAR  . Lisinopril     Documented on MAR     PERTINENT  MEDICATIONS:  Outpatient Encounter Medications as of 07/23/2018  Medication Sig  . acetaminophen (TYLENOL) 325 MG tablet Take 650 mg by mouth every 6 (six) hours as needed for moderate pain.  Marland Kitchen albuterol (PROVENTIL HFA;VENTOLIN HFA) 108 (90 Base) MCG/ACT inhaler Inhale 2 puffs into the lungs every 6 (six) hours as needed for wheezing or shortness of breath.  Marland Kitchen amLODipine (NORVASC) 5 MG tablet Take 5 mg by mouth daily.  Marland Kitchen aspirin EC 81 MG tablet Take 81 mg by mouth daily.  . clopidogrel (PLAVIX) 75 MG tablet Take 1 tablet (75 mg total) by mouth daily.  . clopidogrel (PLAVIX) 75 MG tablet Take 1 tablet (75 mg total) by mouth daily.  Marland Kitchen docusate sodium (COLACE) 100 MG capsule Take 100 mg by mouth 2 (two) times daily.  . febuxostat (ULORIC) 40 MG tablet Take 40 mg by mouth daily.  . feeding supplement, ENSURE ENLIVE, (ENSURE ENLIVE) LIQD Take 237 mLs by mouth 3 (three) times daily between meals.  . hypromellose (GENTEAL SEVERE) 0.3 % GEL ophthalmic ointment Place 1 application into the left eye as needed (irritation/watering).   Marland Kitchen ipratropium (ATROVENT) 0.03 % nasal spray Place 1 spray into both nostrils every 4 (four) hours as needed (congestion).   Marland Kitchen loratadine (CLARITIN) 10 MG tablet Take 10 mg by mouth every evening.   . lovastatin (MEVACOR) 20 MG tablet Take 20 mg by mouth every evening.  . meloxicam (MOBIC) 7.5 MG tablet Take 7.5 mg by mouth every evening.   . tamsulosin (FLOMAX) 0.4 MG CAPS capsule Take 1 capsule (0.4 mg total) by mouth daily.   No facility-administered encounter medications on file as of 07/23/2018.     PHYSICAL EXAM:   General: NAD, frail appearing, thin, cognitively impaired male Cardiovascular: regular rate and rhythm Pulmonary: clear ant fields Abdomen: soft, nontender, + bowel sounds GU: no suprapubic tenderness Extremities: no edema, no joint deformities Skin: no rashes Neurological: Weakness but otherwise nonfocal/functionally impaired/wheelchair  dependent   Prince Rome, NP

## 2018-09-13 ENCOUNTER — Encounter: Payer: Self-pay | Admitting: Nurse Practitioner

## 2018-09-13 ENCOUNTER — Non-Acute Institutional Stay: Payer: Medicare HMO | Admitting: Nurse Practitioner

## 2018-09-13 VITALS — BP 106/68 | HR 84 | Temp 98.3°F | Resp 18 | Wt 108.0 lb

## 2018-09-13 DIAGNOSIS — Z515 Encounter for palliative care: Secondary | ICD-10-CM

## 2018-09-13 DIAGNOSIS — R5381 Other malaise: Secondary | ICD-10-CM

## 2018-09-13 DIAGNOSIS — R630 Anorexia: Secondary | ICD-10-CM

## 2018-09-13 NOTE — Progress Notes (Signed)
Therapist, nutritional Palliative Care Consult Note Telephone: [phone]  Fax: [phone]  PATIENT NAME: [name] Dunaway Sr. DOB: [birth year] MRN: [id number]  PRIMARY CARE PROVIDER:   Dr Jamas Lav PROVIDER:  Dr [facility] RESPONSIBLE PARTY:   Valarie Cones [phone] DSS [facility] Guardian  RECOMMENDATIONS and PLAN:  1. Palliative care encounter Z51.5; Palliative medicine team will continue to support patient, patient's family, and medical team. Visit consisted of counseling and education dealing with the complex and emotionally intense issues of symptom management and palliative care in the setting of serious and potentially life-threatening illness  2. Anorexia R63.0 secondary to protein calorie malnutrition. Continue to monitor daily weights, supplements, supportive measures and courage to eat  3. Debility R53.81 secondary to late onset CVA progressive, encourage passive rom; encourage to transfer to w/c for mobility  ASSESSMENT:     I visit and observed [name]. We talked about purpose for palliative care visit. We talked about how he was feeling today. He verbalize that he was doing fine. We talked about his appetite. We talked about symptoms of pain and shortness of breath what she denies. We talked about smoking cessation and he shared he wants to continue his smoke and doesn't want to talk about that any further. We talked about mobility in socialization. We talked about being out of his room. We talked about role of palliative care and plan of care. Will continue with medical goals to focus on Comfort, DNR in place. I updated nursing staff and any changes to current goals or plan of care. Will send notes to DSS Guardian. Will f/u in 1 month for weight check  10 / 17 / 2019 weight 106.5 lbs 12 / 5 / 2018 weight 110.3 lbs 1 / 3 / 2020 weight 112.4 lbs 08/16/2018 weight 108.0 lb  07/15/2018 wbc 7.9, hemoglobin 9.8, hematocrit  29.4, platelets 353  I spent 45 minutes providing this consultation,  from 12:30pm to 1:15pm. More than 50% of the time in this consultation was spent coordinating communication.   HISTORY OF PRESENT ILLNESS:  [name] Dhanani Sr. is a 85 y.o. year old male with multiple medical problems including Late onset CVA, dysphagia, COPD, chronic kidney disease, hypertension, peripheral vascular disease, chronic pain, coronary artery disease, BPH, gout, lingual hernia repair, tobacco dependence. [facility]. He remains accept transfer to the wheelchair where he is non-ambulatory, total ADL dependence. He does get other residents to push him around the facility including the smoking patio. He is incontinent. He does feed himself and appetite continues to remain poor. He does verbalize his needs. No recent hospitalizations, wounds, infections. 09/13/2018 had a fall no noted injury. He continues to remain a DNR with medical goals to focus on Comfort. At present he is sitting in the wheelchair in his room. He appears thin but comfortable, chronically ill. No visitors present. Palliative Care was asked to help address goals of care.   CODE STATUS:   PPS: 0% HOSPICE ELIGIBILITY/DIAGNOSIS: TBD  PAST MEDICAL HISTORY:  Past Medical History:  Diagnosis Date  . Arthritis   . Gout   . Hypertension   . Stroke [facility])    left sided weakness    SOCIAL HX:  Social History   Tobacco Use  . Smoking status: Current Every Day Smoker    Packs/day: 1.50    Years: 50.00    Pack years: 75.00    Types: Cigarettes  . Smokeless  tobacco: Never Used  Substance Use Topics  . Alcohol use: No    Comment: previous heavy drinker now 1x month    ALLERGIES:  Allergies  Allergen Reactions  . Hydrochlorothiazide     Documented on MAR  . Lisinopril     Documented on MAR     PERTINENT MEDICATIONS:  Outpatient Encounter Medications as of 09/13/2018  Medication Sig  .  acetaminophen (TYLENOL) 325 MG tablet Take 650 mg by mouth every 6 (six) hours as needed for moderate pain.  Marland Kitchen albuterol (PROVENTIL HFA;VENTOLIN HFA) 108 (90 Base) MCG/ACT inhaler Inhale 2 puffs into the lungs every 6 (six) hours as needed for wheezing or shortness of breath.  Marland Kitchen amLODipine (NORVASC) 5 MG tablet Take 5 mg by mouth daily.  Marland Kitchen aspirin EC 81 MG tablet Take 81 mg by mouth daily.  . clopidogrel (PLAVIX) 75 MG tablet Take 1 tablet (75 mg total) by mouth daily.  . clopidogrel (PLAVIX) 75 MG tablet Take 1 tablet (75 mg total) by mouth daily.  Marland Kitchen docusate sodium (COLACE) 100 MG capsule Take 100 mg by mouth 2 (two) times daily.  . febuxostat (ULORIC) 40 MG tablet Take 40 mg by mouth daily.  . feeding supplement, ENSURE ENLIVE, (ENSURE ENLIVE) LIQD Take 237 mLs by mouth 3 (three) times daily between meals.  . hypromellose (GENTEAL SEVERE) 0.3 % GEL ophthalmic ointment Place 1 application into the left eye as needed (irritation/watering).   Marland Kitchen ipratropium (ATROVENT) 0.03 % nasal spray Place 1 spray into both nostrils every 4 (four) hours as needed (congestion).   Marland Kitchen loratadine (CLARITIN) 10 MG tablet Take 10 mg by mouth every evening.   . lovastatin (MEVACOR) 20 MG tablet Take 20 mg by mouth every evening.  . meloxicam (MOBIC) 7.5 MG tablet Take 7.5 mg by mouth every evening.   . tamsulosin (FLOMAX) 0.4 MG CAPS capsule Take 1 capsule (0.4 mg total) by mouth daily.   No facility-administered encounter medications on file as of 09/13/2018.     PHYSICAL EXAM:   General: NAD, frail appearing, thin male Cardiovascular: regular rate and rhythm Pulmonary: clear ant fields Abdomen: soft, nontender, + bowel sounds GU: no suprapubic tenderness Extremities: no edema, no joint deformities; +muscle wasting Skin: no rashes Neurological: Weakness but otherwise nonfocal/wheelchair dependent, non-ambulatory  Christin Z Gusler, NP

## 2018-10-11 ENCOUNTER — Telehealth: Payer: Self-pay | Admitting: Nurse Practitioner

## 2018-10-11 NOTE — Telephone Encounter (Signed)
Reviewing [name] notes as he continues to have an ongoing decline. He recently was diagnosed with pneumonia treated with Rocephin 1gm IM x 5 days in addition to doxycycline 100 mg bid x 10 days. For staff notes he continues to refuse medications. Oral intake is continuing to decline. He is sleeping more. BMI 15. [name] DSS Guardian discussed overall decline with underlying chronic disease progression including protein calorie malnutrition, weight loss with low BMI and now pneumonia with his refusal for medications, hydration, food. We talked option of Hospice Services do to decline with pneumonia. [name] DSS in agreement and will follow up with [name] of Nursing at [facility]. I discussed with Counsellor of Nursing at [facility] overall decline in the setting of disease progression protein calorie malnutrition and now acute pneumonia with failure to thrive. Dr. Katrinka Blazing, notified in all in agreement to proceed with hospice screening.  Total time spent 40 minutes  Documentation 10 minutes  Phone discussion 30 minutes

## 2018-10-12 ENCOUNTER — Telehealth: Payer: Self-pay | Admitting: Nurse Practitioner

## 2018-10-12 NOTE — Telephone Encounter (Signed)
I called and left a message to for [name] DSS guardian to further discuss goc. Notified yesterday that wishes were not to have hospice services.

## 2018-10-18 ENCOUNTER — Non-Acute Institutional Stay: Payer: Medicare HMO | Admitting: Nurse Practitioner

## 2018-10-18 ENCOUNTER — Other Ambulatory Visit: Payer: Self-pay

## 2018-10-18 DIAGNOSIS — R5381 Other malaise: Secondary | ICD-10-CM

## 2018-10-18 DIAGNOSIS — R630 Anorexia: Secondary | ICD-10-CM | POA: Insufficient documentation

## 2018-10-18 DIAGNOSIS — Z515 Encounter for palliative care: Secondary | ICD-10-CM

## 2018-10-18 NOTE — Progress Notes (Signed)
Therapist, nutritional Palliative Care Consult Note Telephone: [phone]  Fax: [phone]  PATIENT NAME: [name] Avalo Sr. DOB: [birth year] MRN: [id number]  PRIMARY CARE PROVIDER:   Dr Jamas Lav PROVIDER: Dr [facility] RESPONSIBLE PARTY:   Valarie Cones [phone] DSS [facility] Guardian  RECOMMENDATIONS and PLAN:  1.Palliative care encounter Z51.5; Palliative medicine team will continue to support patient, patient's family, and medical team. Visit consisted of counseling and education dealing with the complex and emotionally intense issues of symptom management and palliative care in the setting of serious and potentially life-threatening illness  2.Anorexia R63.0 secondary to protein calorie malnutrition. Continue to monitor daily weights, supplements, supportive measures and courage to eat  3.Debility R53.81 secondary to late onset CVA progressive, encourage passive rom; encourage to transfer tow/c for mobility  ASSESSMENT:     I visited and observed [name]. We talked about purpose of palliative care visit. He wanted to be pushed out of his room to the smoking patio. We talked about how he is feeling. We talked about symptoms of pain and shortness of breath what she denies. We talked about his appetite and he shared that he is not hungry. He was cooperative with assessment but focus on going out to smoke. Limited verbal discussion with cognitive impairment. He does appear to have improve the overall chronically sick. He did have a hospice referral screening and is under DSS Guardian. When DSS discussed with daughter though she did not want hospice. What continue to follow monitor with palliative care in 1 month or sooner should he decline. Medical goals to continue to focus on Comfort. DNR does remain in place and do not hospitalize. I called and left message for [name] DSS guardian to return call for update on PC  visit. I updated nursing staff  10 / 17 / 2019 weight 106.5 lbs 12 / 5 / 2018 weight 110.3 lbs 1 / 3 / 2020 weight 112.4 lbs 08/16/2018 weight 108.0 lbs 10/11/2018 weight 106.0 lbs  I spent 45 minutes providing this consultation,  from 1:45pm to 2:15pm. More than 50% of the time in this consultation was spent coordinating communication.   HISTORY OF PRESENT ILLNESS:  [name] Ressel Sr. is a 85 y.o. year old male with multiple medical problems including Late onset CVA, dysphagia, COPD, chronic kidney disease, hypertension, peripheral vascular disease, chronic pain, coronary artery disease, BPH, gout, lingual hernia repair, tobacco dependence. He continues [facility] at still [facility]. [name] continues series that it's still [facility] at [facility]. He does require assistance with transfers to the wheelchair and a staff and other residents to push him around. He is total ADL dependents with incontinence. He does continue to feed himself after tray setup and appetite remains poor. He was recently diagnosed with pneumonia as he received Rocephin 1 gram IM x 5 days in addition to doxycycline 100mg  BID x 10 days. He does refuse the doxycycline on occasion in addition to all of his medication. Staff endorses he seems better to have improved since antibiotic therapy. Previously he was laying in bed lethargic but now he is getting up to the wheelchair and going to the smoking patio. He continues to verbalize his needs. At present he's sitting in the wheelchair in his room. He appears debilitated, thin, chronically ill but comfortable. No visitors present. Palliative Care was asked to help address goals of care.   CODE STATUS: DNR  PPS: 30%  HOSPICE ELIGIBILITY/DIAGNOSIS: TBD  PAST MEDICAL HISTORY:  Past Medical History:  Diagnosis Date   Arthritis    Gout    Hypertension    Stroke (HCC)    left sided weakness    SOCIAL HX:  Social History    Tobacco Use   Smoking status: Current Every Day Smoker    Packs/day: 1.50    Years: 50.00    Pack years: 75.00    Types: Cigarettes   Smokeless tobacco: Never Used  Substance Use Topics   Alcohol use: No    Comment: previous heavy drinker now 1x month    ALLERGIES:  Allergies  Allergen Reactions   Hydrochlorothiazide     Documented on MAR   Lisinopril     Documented on MAR     PERTINENT MEDICATIONS:  Outpatient Encounter Medications as of 10/18/2018  Medication Sig   acetaminophen (TYLENOL) 325 MG tablet Take 650 mg by mouth every 6 (six) hours as needed for moderate pain.   albuterol (PROVENTIL HFA;VENTOLIN HFA) 108 (90 Base) MCG/ACT inhaler Inhale 2 puffs into the lungs every 6 (six) hours as needed for wheezing or shortness of breath.   amLODipine (NORVASC) 5 MG tablet Take 5 mg by mouth daily.   aspirin EC 81 MG tablet Take 81 mg by mouth daily.   clopidogrel (PLAVIX) 75 MG tablet Take 1 tablet (75 mg total) by mouth daily.   clopidogrel (PLAVIX) 75 MG tablet Take 1 tablet (75 mg total) by mouth daily.   docusate sodium (COLACE) 100 MG capsule Take 100 mg by mouth 2 (two) times daily.   febuxostat (ULORIC) 40 MG tablet Take 40 mg by mouth daily.   feeding supplement, ENSURE ENLIVE, (ENSURE ENLIVE) LIQD Take 237 mLs by mouth 3 (three) times daily between meals.   hypromellose (GENTEAL SEVERE) 0.3 % GEL ophthalmic ointment Place 1 application into the left eye as needed (irritation/watering).    ipratropium (ATROVENT) 0.03 % nasal spray Place 1 spray into both nostrils every 4 (four) hours as needed (congestion).    loratadine (CLARITIN) 10 MG tablet Take 10 mg by mouth every evening.    lovastatin (MEVACOR) 20 MG tablet Take 20 mg by mouth every evening.   meloxicam (MOBIC) 7.5 MG tablet Take 7.5 mg by mouth every evening.    tamsulosin (FLOMAX) 0.4 MG CAPS capsule Take 1 capsule (0.4 mg total) by mouth daily.   No facility-administered encounter  medications on file as of 10/18/2018.     PHYSICAL EXAM:   General: NAD, frail appearing, thin, debilitated, chronically ill male Cardiovascular: regular rate and rhythm Pulmonary: clear ant fields Abdomen: soft, nontender, + bowel sounds GU: no suprapubic tenderness Extremities: no edema, no joint deformities/muscle wasting Skin: no rashes Neurological: Weakness but otherwise nonfocal/functionally quadriplegic  Sherol Sabas Prince RomeZ Jerusha Reising, NP

## 2018-11-19 ENCOUNTER — Non-Acute Institutional Stay: Payer: Medicare HMO | Admitting: Nurse Practitioner

## 2018-11-19 ENCOUNTER — Encounter: Payer: Self-pay | Admitting: Nurse Practitioner

## 2018-11-19 ENCOUNTER — Other Ambulatory Visit: Payer: Self-pay

## 2018-11-19 VITALS — BP 118/74 | HR 80 | Temp 97.7°F | Resp 18 | Wt 106.0 lb

## 2018-11-19 DIAGNOSIS — R413 Other amnesia: Secondary | ICD-10-CM

## 2018-11-19 DIAGNOSIS — Z515 Encounter for palliative care: Secondary | ICD-10-CM

## 2018-11-19 DIAGNOSIS — R630 Anorexia: Secondary | ICD-10-CM

## 2018-11-19 DIAGNOSIS — R5381 Other malaise: Secondary | ICD-10-CM

## 2018-11-19 NOTE — Progress Notes (Signed)
[facility] Consult Note Telephone: [phone]  Fax: [phone]  PATIENT NAME: [name] MeigsLinnies Petz Sr. DOB: 06/07/1934 MRN: [id number]  PRIMARY CARE PROVIDER:   Leanna SatoMiles, Linda M, [name]  REFERRING PROVIDER:  [name] RESPONSIBLE PARTY:   [name] [phone] [name]  RECOMMENDATIONS and PLAN:   1. Palliative care encounter Z51.5; Palliative medicine team will continue to support patient, patient's family, and medical team. Visit consisted of counseling and education dealing with the complex and emotionally intense issues of symptom management and palliative care in the setting of serious and potentially life-threatening illness  2. Memory loss R41.3 secondary to late onset CVA. Continue with supportive measures as chronic disease remains Progressive.  3. Debility R53.81 secondary to late onset CVA progressive, encourage passive rom; encourage to transfer to w/c for mobility  4. Anorexia R63.0 but appetite remaining declined. Continue to encourage supplements and comfort feedings.   ASSESSMENT:      I visited and observed [name]. We talk about purpose of palliative care visit and he was in agreement. We talked about symptoms of pain which he denies. We talked about symptoms of shortness of breath which he denies. We talked about his appetite and he sure he is not hungry. Attempted to talk about food that he likes which he shared he did not feel like talking. He wishes to go out to smoke. Encourage smoking cessation, he politely declined. Limited verbal discussion with cognitive impairment and ruminating about smoking. Emotional support provided. He was cooperative with assessment. He does appear to be clinically better from pneumonia so chronic cough lingering. Explained role of palliative care and plan of care. He verbalized he was thankful for palliative care visit. I updated nursing staff  new new changes to current goals of plan of care as family declined Hospice [name] will continue on palliative for Supportive [name], goals of comfort, DNR in ongoing monitoring.  1 / 3 / 2020 weight 112.4 lbs 2 / 10 / 2020 weight 108.0 lbs 3 / 11 / 2020 weight 104.4 lbs 10/11/2018 weight 106 lbs BMI 16.1  I spent 45 minutes providing this consultation,  from 9:00am to 9:45am. More than 50% of the time in this consultation was spent coordinating communication.   HISTORY OF PRESENT ILLNESS:  [name] Calmes Sr. is a 85 y.o. year old male with multiple medical problems including Late onset CVA, dysphagia, COPD, chronic kidney disease, hypertension, peripheral vascular disease, chronic pain, coronary artery disease, BPH, gout, lingual hernia repair, tobacco dependence. [name] continues to reset his field [name]. [name] although daughter declined [name]. He does continue to be under [name] guardianship. He does require assistance with transfers to the wheelchair where he is able to sit up but often leans forward in his wheelchair. When is total ADL dependent with incontinence bowel and bladder. He gets other residents to push him out to the smoking patio where he does smoke daily. He does feed himself with a poor appetite and often drops food. He has actually had a two pound weight gain. He was recently treated with pneumonia with antibiotic therapy. Seems to have improved with symptoms as he has been out of bed the last several days. He has times refuses medication and care at times. He is followed by Psychiatry with last date of service 5 / 1 / 2024 regulatory visit. He continues on tramadol daily  for chronic pain and melatonin for sleep. He does have irritability with staff and anxiety. He is able to verbalize his name's though difficulty with decision making and processing. He is a DNR with goals to focus on Comfort.  Most form has been completed. Most form documents DNR, Comfort Care, do not transfer to hospital, limited use of antibiotics, limited use of IV fluids, no feeding tube. At present he is sitting in his wheelchair. He appears debilitated, then with muscle wasting. He is oriented to self, appears comfortable. No visitors present. Palliative Care was asked to help address to continue to goals of care.   CODE STATUS: DNR  PPS: 40% HOSPICE ELIGIBILITY/DIAGNOSIS: TBD  PAST MEDICAL HISTORY:  Past Medical History:  Diagnosis Date   Arthritis    Gout    Hypertension    Stroke (HCC)    left sided weakness    SOCIAL HX:  Social History   Tobacco Use   Smoking status: Current Every Day Smoker    Packs/day: 1.50    Years: 50.00    Pack years: 75.00    Types: Cigarettes   Smokeless tobacco: Never Used  Substance Use Topics   Alcohol use: No    Comment: previous heavy drinker now 1x month    ALLERGIES:  Allergies  Allergen Reactions   Hydrochlorothiazide     Documented on MAR   Lisinopril     Documented on MAR     PERTINENT MEDICATIONS:  Outpatient Encounter Medications as of 11/19/2018  Medication Sig   acetaminophen (TYLENOL) 325 MG tablet Take 650 mg by mouth every 6 (six) hours as needed for moderate pain.   albuterol (PROVENTIL HFA;VENTOLIN HFA) 108 (90 Base) MCG/ACT inhaler Inhale 2 puffs into the lungs every 6 (six) hours as needed for wheezing or shortness of breath.   amLODipine (NORVASC) 5 MG tablet Take 5 mg by mouth daily.   aspirin EC 81 MG tablet Take 81 mg by mouth daily.   clopidogrel (PLAVIX) 75 MG tablet Take 1 tablet (75 mg total) by mouth daily.   clopidogrel (PLAVIX) 75 MG tablet Take 1 tablet (75 mg total) by mouth daily.   docusate sodium (COLACE) 100 MG capsule Take 100 mg by mouth 2 (two) times daily.   febuxostat (ULORIC) 40 MG tablet Take 40 mg by mouth daily.   feeding supplement, ENSURE ENLIVE, (ENSURE ENLIVE) LIQD Take 237 mLs by mouth  3 (three) times daily between meals.   hypromellose (GENTEAL SEVERE) 0.3 % GEL ophthalmic ointment Place 1 application into the left eye as needed (irritation/watering).    ipratropium (ATROVENT) 0.03 % nasal spray Place 1 spray into both nostrils every 4 (four) hours as needed (congestion).    loratadine (CLARITIN) 10 MG tablet Take 10 mg by mouth every evening.    lovastatin (MEVACOR) 20 MG tablet Take 20 mg by mouth every evening.   meloxicam (MOBIC) 7.5 MG tablet Take 7.5 mg by mouth every evening.    tamsulosin (FLOMAX) 0.4 MG CAPS capsule Take 1 capsule (0.4 mg total) by mouth daily.   No facility-administered encounter medications on file as of 11/19/2018.     PHYSICAL EXAM:   General: NAD, frail appearing, thin, debilitated male Cardiovascular: regular rate and rhythm Pulmonary: clear ant fields Abdomen: soft, nontender, + bowel sounds GU: no suprapubic tenderness Extremities: no edema, no joint deformities/muscle wasting Skin: no rashes Neurological: Weakness but otherwise nonfocal/non-ambulatory  Bristyl Mclees Prince [place], NP

## 2019-01-14 ENCOUNTER — Other Ambulatory Visit: Payer: Self-pay

## 2019-01-14 ENCOUNTER — Encounter: Payer: Self-pay | Admitting: Nurse Practitioner

## 2019-01-14 ENCOUNTER — Non-Acute Institutional Stay: Payer: Medicare HMO | Admitting: Nurse Practitioner

## 2019-01-14 VITALS — BP 110/70 | HR 98 | Temp 98.4°F | Resp 18 | Ht 69.0 in | Wt 106.2 lb

## 2019-01-14 DIAGNOSIS — Z515 Encounter for palliative care: Secondary | ICD-10-CM

## 2019-01-14 DIAGNOSIS — R630 Anorexia: Secondary | ICD-10-CM

## 2019-01-14 NOTE — Progress Notes (Signed)
[facility] Consult Note Telephone: [phone]  Fax: [phone]  PATIENT NAME: [name] MeigsLinnies Voges Sr. DOB: [birth year] MRN: [id number]  PRIMARY CARE PROVIDER:  Dr Kathleen LimeHodges REFERRING PROVIDER: Dr Hodges/[facility] RESPONSIBLE PARTY:   Valarie Coneslexis Shield [phone] DSS [facility] Guardian   RECOMMENDATIONS and PLAN:   1. Palliative care encounter Z51.5; Palliative medicine team will continue to support patient, patient's family, and medical team. Visit consisted of counseling and education dealing with the complex and emotionally intense issues of symptom management and palliative care in the setting of serious and potentially life-threatening illness  2. Memory loss R41.3 secondary to late onset CVA. Continue with supportive measures as chronic disease remains Progressive.  3. Debility R53.81 secondary to late onset CVA progressive, encourage passive rom; encourage to transfer to w/c for mobility  ASSESSMENT:      BMI 15.7 3 / 11 / 2020 weight 104.4 lbs 5 / 19 / 2020 weight 105.5 lbs 6 / 10 / 2020 weight 106.2 lbs  I visited and observed [name]. We talked about purpose of palliative care visit and he nodded his head. Limited verbal discussion due to cognitive impairment. He did talk about wanting to get his cigarettes. He talked about wanting to go out to smoke. He was sitting with Oreo cookies in his lap feeding himself. We talked about symptoms of pain which he replied no. He was cooperative with assessment. Emotional support provided. He ruminated about the cigarettes and going to the smoking patio. At present time will continue to follow, monitor with palliative care as hospice was previously suggested their daughter declined. He is under DSS Guardianship and will contact for update of palliative care visit and the new changes to current goals are plan of care. Will follow up in six weeks if needed or sooner should he  declined. I updated nursing staff new new changes to current goals or plan of care.   I spent 30 minutes providing this consultation,  from 3:15pm to 3:45pm. More than 50% of the time in this consultation was spent coordinating communication.   HISTORY OF PRESENT ILLNESS:  [name] Mentink Sr. is a 85 y.o. year old male with multiple medical problems including Late onset CVA, dysphagia, COPD, chronic kidney disease, hypertension, peripheral vascular disease, chronic pain, coronary artery disease, BPH, gout, lingual hernia repair, tobacco dependence. [name] continues to reside at [facility]. He is a lift to the wheelchair. He is Mobile in the facility by other residents pushing him. He is non-ambulatory. He does require assistance for adl's, incontinent bowel and bladder. He does feed himself after tray setup and it does take him some effort to eat. Staff endorses he has been intermittently refusing medications. He does continue to get tramadol 50 mg Q 12 hours as needed for pain although he also refuses at times. No recent Falls, infections, hospitalizations. He does continue to be followed by psychiatrists last day to serve is 5 / 1 / 2020 for regulatory visit for regulatory visit no medication changes. Primary provider last visit 6 / 11 / 2020 for COPD and nicotine dependency which he does continue to smoke and refuses to quit. Most form is complete with DNR / do not intubate / do not hospitalize / wishes are for antibiotic therapy, IV fluids, diagnostic testing, lab testing. At present he is sitting in the wheelchair in the hall. He denies any concerns are complaints. Palliative Care  was asked to help to continue to address goals of care.   CODE STATUS: DNR  PPS: 40% HOSPICE ELIGIBILITY/DIAGNOSIS: TBD  PAST MEDICAL HISTORY:  Past Medical History:  Diagnosis Date   Arthritis    Gout    Hypertension    Stroke ([place] [facility] [place])    left sided  weakness    SOCIAL HX:  Social History   Tobacco Use   Smoking status: Current Every Day Smoker    Packs/day: 1.50    Years: 50.00    Pack years: 75.00    Types: Cigarettes   Smokeless tobacco: Never Used  Substance Use Topics   Alcohol use: No    Comment: previous heavy drinker now 1x month    ALLERGIES:  Allergies  Allergen Reactions   Hydrochlorothiazide     Documented on MAR   Lisinopril     Documented on MAR     PERTINENT MEDICATIONS:  Outpatient Encounter Medications as of 01/14/2019  Medication Sig   acetaminophen (TYLENOL) 325 MG tablet Take 650 mg by mouth every 6 (six) hours as needed for moderate pain.   clopidogrel (PLAVIX) 75 MG tablet Take 1 tablet (75 mg total) by mouth daily.   docusate sodium (COLACE) 100 MG capsule Take 100 mg by mouth 2 (two) times daily.   febuxostat (ULORIC) 40 MG tablet Take 40 mg by mouth daily.   feeding supplement, ENSURE ENLIVE, (ENSURE ENLIVE) LIQD Take 237 mLs by mouth 3 (three) times daily between meals.   guaiFENesin (ROBITUSSIN) 100 MG/5ML SOLN Take 10 mLs by mouth every 6 (six) hours as needed for cough or to loosen phlegm.   ipratropium (ATROVENT) 0.03 % nasal spray Place 1 spray into both nostrils every 4 (four) hours as needed (congestion).    Multiple Vitamins-Minerals (MULTIVITAMIN WITH MINERALS) tablet Take 1 tablet by mouth daily.   promethazine (PHENERGAN) 12.5 MG tablet Take 12.5 mg by mouth every 6 (six) hours as needed for nausea or vomiting.   tamsulosin (FLOMAX) 0.4 MG CAPS capsule Take 1 capsule (0.4 mg total) by mouth daily.   traMADol (ULTRAM) 50 MG tablet Take 50 mg by mouth every 12 (twelve) hours as needed.   [DISCONTINUED] clopidogrel (PLAVIX) 75 MG tablet Take 1 tablet (75 mg total) by mouth daily.   [DISCONTINUED] albuterol (PROVENTIL HFA;VENTOLIN HFA) 108 (90 Base) MCG/ACT inhaler Inhale 2 puffs into the lungs every 6 (six) hours as needed for wheezing or shortness of breath. (Patient not  taking: Reported on 01/14/2019)   [DISCONTINUED] amLODipine (NORVASC) 5 MG tablet Take 5 mg by mouth daily.   [DISCONTINUED] aspirin EC 81 MG tablet Take 81 mg by mouth daily.   [DISCONTINUED] hypromellose (GENTEAL SEVERE) 0.3 % GEL ophthalmic ointment Place 1 application into the left eye as needed (irritation/watering).    [DISCONTINUED] loratadine (CLARITIN) 10 MG tablet Take 10 mg by mouth every evening.    [DISCONTINUED] lovastatin (MEVACOR) 20 MG tablet Take 20 mg by mouth every evening.   [DISCONTINUED] meloxicam (MOBIC) 7.5 MG tablet Take 7.5 mg by mouth every evening.    No facility-administered encounter medications on file as of 01/14/2019.     PHYSICAL EXAM:   General: NAD, frail appearing, thin, debilitated, confused male Cardiovascular: regular rate and rhythm Pulmonary: clear ant fields Abdomen: soft, nontender, + bowel sounds GU: no suprapubic tenderness Extremities: no edema, no joint deformities/muscle wasting; atrophy BLE Skin: no rashes Neurological: Weakness but otherwise nonfocal/nonambulatory  Cambrey Lupi Ihor Gully, NP

## 2019-03-02 ENCOUNTER — Encounter: Payer: Self-pay | Admitting: Nurse Practitioner

## 2019-03-02 ENCOUNTER — Non-Acute Institutional Stay: Payer: Medicaid Other | Admitting: Nurse Practitioner

## 2019-03-02 VITALS — BP 144/84 | HR 96 | Temp 97.9°F | Resp 20 | Wt 105.0 lb

## 2019-03-02 DIAGNOSIS — Z515 Encounter for palliative care: Secondary | ICD-10-CM

## 2019-03-02 NOTE — Progress Notes (Signed)
[facility]rap[facility], [facility] Consult Note Telephone: [phone]  Fax: [phone]  PATIENT NAME: [name] MeigsLinnies Komar Sr. DOB: February 06, 1934 MRN: [id number]  PRIMARY CARE PROVIDER:   Dr Kathleen LimeHodges REFERRING PROVIDER: Dr Hodges/[facility] RESPONSIBLE PARTY:   Valarie Coneslexis Shield [phone] DSS [facility] Guardian  RECOMMENDATIONS and PLAN:  1.ACP; DNR; goals focus on comfort  2.Anorexia R63.0 secondary to protein calorie malnutrition. Continue to monitor daily weights, supplements, supportive measures and courage to eat  3.Debility R53.81 secondary to late onset CVA progressive, encourage passive rom; encourage to transfer tow/c for mobility  4. Palliative care encounter Z51.5; Palliative medicine team will continue to support patient, patient's family, and medical team. Visit consisted of counseling and education dealing with the complex and emotionally intense issues of symptom management and palliative care in the setting of serious and potentially life-threatening illness  10 / 17 / 2019 weight 106.5 lbs 12 / 5 / 2018 weight 110.3 lbs 1 / 3 / 2020 weight 112.4 lbs 08/16/2018 weight 108.0 lbs 02/10/2019 weight 105.0 lbs I spent 30 minutes providing this consultation,  from 1:30pm to 2:00pm. More than 50% of the time in this consultation was spent coordinating communication.   HISTORY OF PRESENT ILLNESS:  [name] Jozwiak Sr. is a 85 y.o. year old male with multiple medical problems including Late onset CVA, dysphagia, COPD, chronic kidney disease, hypertension, peripheral vascular disease, chronic pain, coronary artery disease, BPH, gout, lingual hernia repair, tobacco dependence. Palliative Care was asked to help to continue to address goals of care. [name] continues to reside at skilled [facility] at [facility]. He is a lift transfer to the wheelchair where he is able to sit up during the day. He asks  residents and staff to push him around including the smoking patio. He declined smoking cessation. He is total ADL care with incontinence. He does feed himself after tray setup and some assistance. Appetite varies from day to day. He does like to snack. No recent Falls, wounds, infections. Last primary provider visit 8 / 6 / 2020 for gout and monthly visit. Gout continues to be controlled with medical therapy and uric acid level normal. No changes to goals of care at this visit. He is able to verbalize his needs but has trouble with memory, processing and judgment. Staff reports no new concerns. No recent hospitalizations, infections, Falls, wounds. He is a do not resuscitate. At present he is sitting up in the wheelchair in his room. He appears comfortable, thin, debilitated. No visitors present. I visited and observed [name]. We talked about purpose of palliative care visit. He did make eye contact and said okay. We talked about how he's feeling today. He sure that he is doing well. He wanted to go to the smoking patio and was hoping somebody would bring him. We talked about symptoms of pain and shortness of breath but she denies. We talked about his appetite and he shared that he is hungry. Offered him a snack but he said he would wait until after smoking. We talked about his activity level encouraged him to be up during the day. He agreed. Limited verbal discussion with cognitive impairment. He was cooperative with assessment. Emotional support provided. Medical goals continue to remain to focus on Comfort no new changes to current goals are plan of care at present time.  4 / 30 / 2020 covid-19 negative  CODE STATUS: DNR  PPS: 40% HOSPICE ELIGIBILITY/DIAGNOSIS: TBD  PAST  MEDICAL HISTORY:  Past Medical History:  Diagnosis Date   Arthritis    Gout    Hypertension    Stroke ([place])    left sided weakness    SOCIAL HX:  Social History   Tobacco Use   Smoking status: Current Every Day  Smoker    Packs/day: 1.50    Years: 50.00    Pack years: 75.00    Types: Cigarettes   Smokeless tobacco: Never Used  Substance Use Topics   Alcohol use: No    Comment: previous heavy drinker now 1x month    ALLERGIES:  Allergies  Allergen Reactions   Hydrochlorothiazide     Documented on MAR   Lisinopril     Documented on MAR     PERTINENT MEDICATIONS:  Outpatient Encounter Medications as of 03/02/2019  Medication Sig   acetaminophen (TYLENOL) 325 MG tablet Take 650 mg by mouth every 6 (six) hours as needed for moderate pain.   clopidogrel (PLAVIX) 75 MG tablet Take 1 tablet (75 mg total) by mouth daily.   docusate sodium (COLACE) 100 MG capsule Take 100 mg by mouth 2 (two) times daily.   febuxostat (ULORIC) 40 MG tablet Take 40 mg by mouth daily.   feeding supplement, ENSURE ENLIVE, (ENSURE ENLIVE) LIQD Take 237 mLs by mouth 3 (three) times daily between meals.   guaiFENesin (ROBITUSSIN) 100 MG/5ML SOLN Take 10 mLs by mouth every 6 (six) hours as needed for cough or to loosen phlegm.   ipratropium (ATROVENT) 0.03 % nasal spray Place 1 spray into both nostrils every 4 (four) hours as needed (congestion).    Multiple Vitamins-Minerals (MULTIVITAMIN WITH MINERALS) tablet Take 1 tablet by mouth daily.   promethazine (PHENERGAN) 12.5 MG tablet Take 12.5 mg by mouth every 6 (six) hours as needed for nausea or vomiting.   tamsulosin (FLOMAX) 0.4 MG CAPS capsule Take 1 capsule (0.4 mg total) by mouth daily.   traMADol (ULTRAM) 50 MG tablet Take 50 mg by mouth every 12 (twelve) hours as needed.   No facility-administered encounter medications on file as of 03/02/2019.     PHYSICAL EXAM:   General: NAD, frail appearing, thin, debilitated, pleasant male Cardiovascular: regular rate and rhythm Pulmonary: clear ant fields Extremities: mild BLE edema, no joint deformities Neurological: Weakness but otherwise nonfocal/non-ambulatory;   Laporsha Grealish Ihor Gully, NP

## 2019-03-03 ENCOUNTER — Other Ambulatory Visit: Payer: Self-pay

## 2019-04-06 ENCOUNTER — Other Ambulatory Visit: Payer: Self-pay

## 2019-04-06 ENCOUNTER — Encounter: Payer: Self-pay | Admitting: Nurse Practitioner

## 2019-04-06 ENCOUNTER — Non-Acute Institutional Stay: Payer: Medicaid Other | Admitting: Nurse Practitioner

## 2019-04-06 VITALS — BP 136/86 | HR 98 | Temp 97.0°F | Resp 18 | Wt 100.1 lb

## 2019-04-06 DIAGNOSIS — Z515 Encounter for palliative care: Secondary | ICD-10-CM

## 2019-04-06 NOTE — Progress Notes (Signed)
Designer, [facility]ell[facility] Palliative Care Consult Note Telephone: [phone]  Fax: [phone]  PATIENT NAME: [name] Zingaro Sr. DOB: 1934-02-12 MRN: [id number]  PRIMARY CARE PROVIDER:    Dr Yves Dill PROVIDER: Dr Hodges/[facility] RESPONSIBLE PARTY:   Lamount Cranker Mack and PLAN: 1.ACP; DNR; goals focus on comfort  2.Anorexia secondary to protein calorie malnutrition. Continue to monitor daily weights, supplements, supportive measures and courage to eat  3.Debility  secondary to late onset CVA progressive, encourage passive rom; encourage to transfer tow/c for mobility  4. Palliative care encounter; Palliative medicine team will continue to support patient, patient's family, and medical team. Visit consisted of counseling and education dealing with the complex and emotionally intense issues of symptom management and palliative care in the setting of serious and potentially life-threatening illness  6 / 10 / 2020 weight 106.2 lbs 8 / 6 / 2020 weight 105.0 lbs 9 / 3 / 2020 weight 100.1 lbs BMI 14.8  I spent 35 minutes providing this consultation,  from 12:15pm to 12:50pm. More than 50% of the time in this consultation was spent coordinating communication.   HISTORY OF PRESENT ILLNESS:  [name] Gernert Sr. is a 85 y.o. year old male with multiple medical problems including Late onset CVA, dysphagia, COPD, chronic kidney disease, hypertension, peripheral vascular disease, chronic pain, coronary artery disease, BPH, gout, lingual hernia repair, tobacco dependence. [name] continues to reside at [place] [facility]. He is a lift transfer to the wheelchair where he does get staff to maneuver him around including the smoking patio. He is total ADL dependents with incontinence. He feeds himself after tray setup. Appetite remains poor. Last  primary provider visit  for depression and tobacco dependence.  No new changes to orders. He is currently being treated for pneumonia with Rocephin and Levaquin. At present he is sitting in the wheelchair in the hall. He appears comfortable, debilitated. No visitors present. I visited and observed [name]. We talked about how he was feeling. He said that he is okay. He was hungry. Supplement given. We talked about symptoms of pain and shortness of breath which he is not experiencing. He did talk about coughing. We talked about importance of taking his Levaquin as previously he does refuse pills at times. Limited verbal discussion with cognitive impairment. He was cooperative with assessment. He was thankful for the visit. Will continue to monitor and follow with palliative care medical goals do you focus more on comfort with DNR in place. Therapeutic listening and emotional support provided. Will follow up in one week if needed or sooner should he declined with current treatment for pneumonia.  Palliative Care was asked to help to continue to address goals of care.   CODE STATUS: DNR  PPS: 40% HOSPICE ELIGIBILITY/DIAGNOSIS: TBD  PAST MEDICAL HISTORY:  Past Medical History:  Diagnosis Date   Arthritis    Gout    Hypertension    Stroke ([place])    left sided weakness    SOCIAL HX:  Social History   Tobacco Use   Smoking status: Current Every Day Smoker    Packs/day: 1.50    Years: 50.00    Pack years: 75.00    Types: Cigarettes   Smokeless tobacco: Never Used  Substance Use Topics   Alcohol use: No    Comment: previous heavy drinker now 1x month    ALLERGIES:  Allergies  Allergen Reactions  Hydrochlorothiazide     Documented on MAR   Lisinopril     Documented on MAR     PERTINENT MEDICATIONS:  Outpatient Encounter Medications as of 04/06/2019  Medication Sig   acetaminophen (TYLENOL) 325 MG tablet Take 650 mg by mouth every 6 (six) hours as needed for moderate pain.    clopidogrel (PLAVIX) 75 MG tablet Take 1 tablet (75 mg total) by mouth daily.   docusate sodium (COLACE) 100 MG capsule Take 100 mg by mouth 2 (two) times daily.   febuxostat (ULORIC) 40 MG tablet Take 40 mg by mouth daily.   feeding supplement, ENSURE ENLIVE, (ENSURE ENLIVE) LIQD Take 237 mLs by mouth 3 (three) times daily between meals.   guaiFENesin (ROBITUSSIN) 100 MG/5ML SOLN Take 10 mLs by mouth every 6 (six) hours as needed for cough or to loosen phlegm.   ipratropium (ATROVENT) 0.03 % nasal spray Place 1 spray into both nostrils every 4 (four) hours as needed (congestion).    Multiple Vitamins-Minerals (MULTIVITAMIN WITH MINERALS) tablet Take 1 tablet by mouth daily.   promethazine (PHENERGAN) 12.5 MG tablet Take 12.5 mg by mouth every 6 (six) hours as needed for nausea or vomiting.   tamsulosin (FLOMAX) 0.4 MG CAPS capsule Take 1 capsule (0.4 mg total) by mouth daily.   traMADol (ULTRAM) 50 MG tablet Take 50 mg by mouth every 12 (twelve) hours as needed.   No facility-administered encounter medications on file as of 04/06/2019.     PHYSICAL EXAM:   General: NAD, frail appearing, thin, male Cardiovascular: regular rate and rhythm Pulmonary: +rhonchi Abdomen: soft, nontender, + bowel sounds Extremities: no edema, no joint deformities/muscle wasting Neurological: functional quadriplegic  Prospero Mahnke Prince [place], NP

## 2019-05-08 DEATH — deceased
